# Patient Record
Sex: Male | Born: 1960 | Race: White | Hispanic: No | Marital: Married | Smoking: Heavy tobacco smoker
Health system: Southern US, Community
[De-identification: ages and names within clinical notes are randomized; demographics above are authoritative.]

## PROBLEM LIST (undated history)

## (undated) DIAGNOSIS — J45909 Unspecified asthma, uncomplicated: Secondary | ICD-10-CM

## (undated) DIAGNOSIS — M5136 Other intervertebral disc degeneration, lumbar region: Secondary | ICD-10-CM

## (undated) DIAGNOSIS — M542 Cervicalgia: Secondary | ICD-10-CM

## (undated) DIAGNOSIS — M549 Dorsalgia, unspecified: Secondary | ICD-10-CM

## (undated) DIAGNOSIS — M5030 Other cervical disc degeneration, unspecified cervical region: Secondary | ICD-10-CM

## (undated) DIAGNOSIS — J189 Pneumonia, unspecified organism: Secondary | ICD-10-CM

## (undated) DIAGNOSIS — J449 Chronic obstructive pulmonary disease, unspecified: Secondary | ICD-10-CM

## (undated) DIAGNOSIS — I10 Essential (primary) hypertension: Secondary | ICD-10-CM

## (undated) DIAGNOSIS — G8929 Other chronic pain: Secondary | ICD-10-CM

## (undated) DIAGNOSIS — M51369 Other intervertebral disc degeneration, lumbar region without mention of lumbar back pain or lower extremity pain: Secondary | ICD-10-CM

## (undated) HISTORY — PX: BILATERAL CARPAL TUNNEL RELEASE: SHX6508

## (undated) HISTORY — DX: Essential (primary) hypertension: I10

## (undated) HISTORY — PX: ULNAR NERVE TRANSPOSITION: SHX2595

## (undated) HISTORY — PX: OTHER SURGICAL HISTORY: SHX169

## (undated) HISTORY — PX: BACK SURGERY: SHX140

## (undated) HISTORY — PX: TONSILLECTOMY: SUR1361

---

## 2012-07-03 HISTORY — PX: COLONOSCOPY: SHX174

## 2012-07-03 HISTORY — PX: ESOPHAGOGASTRODUODENOSCOPY: SHX1529

## 2014-03-16 ENCOUNTER — Emergency Department (HOSPITAL_COMMUNITY)
Admission: EM | Admit: 2014-03-16 | Discharge: 2014-03-16 | Disposition: A | Discharge status: Home / Self Care | Payer: Medicaid Other | Attending: Emergency Medicine | Admitting: Emergency Medicine

## 2014-03-16 ENCOUNTER — Encounter (HOSPITAL_COMMUNITY): Payer: Self-pay | Admitting: Emergency Medicine

## 2014-03-16 DIAGNOSIS — Z9889 Other specified postprocedural states: Secondary | ICD-10-CM | POA: Diagnosis not present

## 2014-03-16 DIAGNOSIS — M5442 Lumbago with sciatica, left side: Secondary | ICD-10-CM

## 2014-03-16 DIAGNOSIS — M5430 Sciatica, unspecified side: Secondary | ICD-10-CM | POA: Diagnosis not present

## 2014-03-16 DIAGNOSIS — M545 Low back pain, unspecified: Secondary | ICD-10-CM | POA: Diagnosis present

## 2014-03-16 DIAGNOSIS — F17200 Nicotine dependence, unspecified, uncomplicated: Secondary | ICD-10-CM | POA: Diagnosis not present

## 2014-03-16 DIAGNOSIS — M5136 Other intervertebral disc degeneration, lumbar region: Secondary | ICD-10-CM

## 2014-03-16 DIAGNOSIS — M5137 Other intervertebral disc degeneration, lumbosacral region: Secondary | ICD-10-CM | POA: Diagnosis not present

## 2014-03-16 DIAGNOSIS — M51369 Other intervertebral disc degeneration, lumbar region without mention of lumbar back pain or lower extremity pain: Secondary | ICD-10-CM

## 2014-03-16 DIAGNOSIS — M51379 Other intervertebral disc degeneration, lumbosacral region without mention of lumbar back pain or lower extremity pain: Secondary | ICD-10-CM | POA: Insufficient documentation

## 2014-03-16 MED ORDER — INDOMETHACIN 25 MG PO CAPS
25.0000 mg | ORAL_CAPSULE | Freq: Once | ORAL | Status: AC
  Administered 2014-03-16: 25 mg via ORAL
  Filled 2014-03-16: qty 1

## 2014-03-16 MED ORDER — DEXAMETHASONE 4 MG PO TABS: ORAL_TABLET | ORAL | Status: DC

## 2014-03-16 MED ORDER — OXYCODONE-ACETAMINOPHEN 5-325 MG PO TABS: 1.0000 | ORAL_TABLET | Freq: Three times a day (TID) | ORAL | Status: DC | PRN

## 2014-03-16 MED ORDER — HYDROMORPHONE HCL PF 2 MG/ML IJ SOLN
2.0000 mg | Freq: Once | INTRAMUSCULAR | Status: AC
Start: 2014-03-16 — End: 2014-03-16
  Administered 2014-03-16: 2 mg via INTRAMUSCULAR
  Filled 2014-03-16: qty 1

## 2014-03-16 MED ORDER — ONDANSETRON HCL 4 MG PO TABS
4.0000 mg | ORAL_TABLET | Freq: Once | ORAL | Status: AC
  Administered 2014-03-16: 4 mg via ORAL
  Filled 2014-03-16: qty 1

## 2014-03-16 MED ORDER — METHOCARBAMOL 500 MG PO TABS: 500.0000 mg | ORAL_TABLET | Freq: Three times a day (TID) | ORAL | Status: DC

## 2014-03-16 MED ORDER — DIAZEPAM 5 MG PO TABS
5.0000 mg | ORAL_TABLET | Freq: Once | ORAL | Status: AC
  Administered 2014-03-16: 5 mg via ORAL
  Filled 2014-03-16: qty 1

## 2014-03-16 MED ORDER — DICLOFENAC SODIUM 75 MG PO TBEC: 75.0000 mg | DELAYED_RELEASE_TABLET | Freq: Two times a day (BID) | ORAL | Status: DC

## 2014-03-16 MED ORDER — OXYCODONE-ACETAMINOPHEN 5-325 MG PO TABS: 1.0000 | ORAL_TABLET | Freq: Four times a day (QID) | ORAL | Status: DC | PRN

## 2014-03-16 NOTE — ED Provider Notes (Signed)
CSN: [id number]     Arrival date & time 03/16/14  1228 History   First MD Initiated Contact with Patient 03/16/14 1448   This chart was scribed for non-physician practitioner Ivery Quale, PA-C, working with Hilario Quarry, MD by Gwenevere Abbot, ED scribe. This patient was seen in room APFT21/APFT21 and the patient's care was started at 3:11 PM.    Chief Complaint  Patient presents with  . Back Pain   Patient is a 53 y.o. male presenting with back pain. The history is provided by the patient. No language interpreter was used.  Back Pain Location:  Lumbar spine Quality:  Aching and shooting Pain severity:  Severe Timing:  Constant Progression:  Worsening Chronicity:  Chronic Relieved by:  Nothing Associated symptoms: no abdominal pain, no chest pain, no dysuria and no fever    HPI Comments:  Coulson Wehner is a 53 y.o. male who presents to the Emergency Department complaining of severe left lower back pain that radiates to the legs bilaterally. Pt has h/o 4 back surgeries.  Pt's last surgery was in 1992 and pt had rods placed at [facility] by Dr. Crista Curb. Pt reports that he is supposed to have a 5th surgery but he is extremely concerned about the risks involved, so he has not had the surgery. Pt reports that he recently moved to the area, and he has not established care with a provider. Pt has attempted to manage his pain with motrin and ibuprofen, but this morning the pain was unusually worse.   History reviewed. No pertinent past medical history. Past Surgical History  Procedure Laterality Date  . Back surgery     History reviewed. No pertinent family history. History  Substance Use Topics  . Smoking status: Current Every Day Smoker  . Smokeless tobacco: Not on file  . Alcohol Use: No    Review of Systems  Constitutional: Negative for fever, chills and activity change.       All ROS Neg except as noted in HPI  Eyes: Negative for photophobia and discharge.  Respiratory:  Negative for cough, shortness of breath and wheezing.   Cardiovascular: Negative for chest pain and palpitations.  Gastrointestinal: Negative for abdominal pain and blood in stool.  Genitourinary: Negative for dysuria, frequency and hematuria.  Musculoskeletal: Positive for arthralgias and back pain. Negative for neck pain.  Skin: Negative.   Neurological: Negative for dizziness, seizures and speech difficulty.  Psychiatric/Behavioral: Negative for hallucinations and confusion.      Allergies  Vicodin  Home Medications   Prior to Admission medications   Medication Sig Start Date End Date Taking? Authorizing Provider  acetaminophen (TYLENOL) 500 MG tablet Take 1,000 mg by mouth every 6 (six) hours as needed for moderate pain.   Yes Historical Provider, MD  ibuprofen (ADVIL,MOTRIN) 800 MG tablet Take 800 mg by mouth every 8 (eight) hours as needed for moderate pain.   Yes Historical Provider, MD   BP 160/102  Pulse 90  Temp(Src) 98.6 F (37 C) (Oral)  Resp 20  Ht  (1.753 m)  Wt 200 lb (90.719 kg)  BMI 29.52 kg/m2  SpO2 100% Physical Exam  Nursing note and vitals reviewed. Constitutional: He is oriented to person, place, and time. He appears well-developed and well-nourished.  Patient appears uncomfortable, rocking back and forth in the bed, and groaning upon my arrival.  HENT:  Head: Normocephalic and atraumatic.  Eyes: EOM are normal.  Neck: Normal range of motion. Neck supple.  Cardiovascular:  Normal rate.   Pulmonary/Chest: Effort normal.  Musculoskeletal: Normal range of motion. He exhibits tenderness.  There is paraspinal tenderness on the right and left at the lumbar region. There no hot areas appreciated. There is some bony area tenderness, but no palpable step off. There no hot areas appreciated.  Neurological: He is alert and oriented to person, place, and time.  Examination is limited due to to patient's lower extremity pain. But no gross motor or sensory  deficit appreciated. Gait was not tested due to patient's pain level.  Skin: Skin is warm and dry.  Psychiatric: He has a normal mood and affect. His behavior is normal.    ED Course  Procedures  DIAGNOSTIC STUDIES: Oxygen Saturation is 100% on RA, normal by my interpretation.  COORDINATION OF CARE: 3:16 PM-Discussed treatment plan which includes *intramuscular pain medication here in the emergency department. Prescription for Percocet, Robaxin, Voltaren, and Decadron.  Labs Review Labs Reviewed - No data to display  Imaging Review No results found.   EKG Interpretation None      MDM  Patient has had multiple surgeries in the past. He continues to have a great deal of pain. He has been told of the need to have another surgery, but is reluctant to do so because of the various risk involved. The blood pressure is 160/102, otherwise the vital signs are within normal limits. The oxygen level is 100% on room air. Within normal limits by my interpretation. Limited examination reveals no emergent changes. Patient states that he is mostly at baseline with the exception of the flareup of this pain.  Have encouraged patient to contact the orthopedic clinics at the one of the [facility] centers as he has had [facility] surgery and followup with his multiple back surgeries. Patient given medication to assist with his discomfort while making these arrangements, to include Decadron, Robaxin, fall turned, and Percocet.    Final diagnoses:  None    *I have reviewed nursing notes, vital signs, and all appropriate lab and imaging results for this patient.** **I personally performed the services described in this documentation, which was scribed in my presence. The recorded information has been reviewed and is accurate.Kathie Dike, PA-C 03/16/14 [phone]

## 2014-03-16 NOTE — Discharge Instructions (Signed)
Consultation with the [facility] orthopedic Center maybe helpful for management. Decadron, diclofenac, and Robaxin daily. Use Percocet for more severe pain. Percocet and Robaxin may cause drowsiness, please use with caution.

## 2014-03-16 NOTE — ED Notes (Signed)
Pt with extreme back pain. Pt with hx of 4 back surgeries including a fusion. Pt can barely stand. Needs great assistance with movement.

## 2014-03-16 NOTE — ED Notes (Signed)
Low back pain, hx of back surgery,

## 2014-03-17 NOTE — ED Provider Notes (Signed)
History/physical exam/procedure(s) were performed by non-physician practitioner and as supervising physician I was immediately available for consultation/collaboration. I have reviewed all notes and am in agreement with care and plan.   Keaun Schnabel S Carey Lafon, MD 03/17/14 2318 

## 2014-04-24 ENCOUNTER — Emergency Department (HOSPITAL_COMMUNITY)
Admission: EM | Admit: 2014-04-24 | Discharge: 2014-04-24 | Disposition: A | Discharge status: Home / Self Care | Payer: Medicaid Other | Attending: Emergency Medicine | Admitting: Emergency Medicine

## 2014-04-24 ENCOUNTER — Encounter (HOSPITAL_COMMUNITY): Payer: Self-pay | Admitting: Emergency Medicine

## 2014-04-24 ENCOUNTER — Emergency Department (HOSPITAL_COMMUNITY): Payer: Medicaid Other

## 2014-04-24 DIAGNOSIS — Z7952 Long term (current) use of systemic steroids: Secondary | ICD-10-CM | POA: Insufficient documentation

## 2014-04-24 DIAGNOSIS — J9801 Acute bronchospasm: Secondary | ICD-10-CM | POA: Insufficient documentation

## 2014-04-24 DIAGNOSIS — Z720 Tobacco use: Secondary | ICD-10-CM | POA: Insufficient documentation

## 2014-04-24 DIAGNOSIS — R0602 Shortness of breath: Secondary | ICD-10-CM | POA: Diagnosis present

## 2014-04-24 DIAGNOSIS — Z79899 Other long term (current) drug therapy: Secondary | ICD-10-CM | POA: Diagnosis not present

## 2014-04-24 DIAGNOSIS — R059 Cough, unspecified: Secondary | ICD-10-CM

## 2014-04-24 DIAGNOSIS — R05 Cough: Secondary | ICD-10-CM

## 2014-04-24 DIAGNOSIS — J441 Chronic obstructive pulmonary disease with (acute) exacerbation: Secondary | ICD-10-CM | POA: Diagnosis not present

## 2014-04-24 HISTORY — DX: Chronic obstructive pulmonary disease, unspecified: J44.9

## 2014-04-24 LAB — COMPREHENSIVE METABOLIC PANEL
ALT: 14 U/L (ref 0–53)
AST: 14 U/L (ref 0–37)
Albumin: 3.8 g/dL (ref 3.5–5.2)
Alkaline Phosphatase: 71 U/L (ref 39–117)
Anion gap: 13 (ref 5–15)
BUN: 9 mg/dL (ref 6–23)
CO2: 24 mEq/L (ref 19–32)
Calcium: 9.3 mg/dL (ref 8.4–10.5)
Chloride: 103 mEq/L (ref 96–112)
Creatinine, Ser: 0.9 mg/dL (ref 0.50–1.35)
GFR calc Af Amer: >90 mL/min (ref >90)
GFR calc non Af Amer: >90 mL/min (ref >90)
Glucose, Bld: 128 mg/dL — ABNORMAL HIGH (ref 70–99)
Potassium: 3.9 mEq/L (ref 3.7–5.3)
Sodium: 140 mEq/L (ref 137–147)
Total Bilirubin: 0.6 mg/dL (ref 0.3–1.2)
Total Protein: 6.9 g/dL (ref 6.0–8.3)

## 2014-04-24 LAB — CBC WITH DIFFERENTIAL/PLATELET
Basophils Absolute: 0 10*3/uL (ref 0.0–0.1)
Basophils Relative: 0 % (ref 0–1)
EOS ABS: 0.2 10*3/uL (ref 0.0–0.7)
EOS PCT: 2 % (ref 0–5)
HCT: 47.3 % (ref 39.0–52.0)
Hemoglobin: 16 g/dL (ref 13.0–17.0)
Lymphocytes Relative: 26 % (ref 12–46)
Lymphs Abs: 2.2 10*3/uL (ref 0.7–4.0)
MCH: 31.1 pg (ref 26.0–34.0)
MCHC: 33.8 g/dL (ref 30.0–36.0)
MCV: 91.8 fL (ref 78.0–100.0)
Monocytes Absolute: 0.7 10*3/uL (ref 0.1–1.0)
Monocytes Relative: 8 % (ref 3–12)
Neutro Abs: 5.3 10*3/uL (ref 1.7–7.7)
Neutrophils Relative %: 64 % (ref 43–77)
Platelets: 227 10*3/uL (ref 150–400)
RBC: 5.15 MIL/uL (ref 4.22–5.81)
RDW: 12.5 % (ref 11.5–15.5)
WBC: 8.3 10*3/uL (ref 4.0–10.5)

## 2014-04-24 LAB — TROPONIN I: Troponin I: <0.3 ng/mL (ref <0.30)

## 2014-04-24 LAB — PRO B NATRIURETIC PEPTIDE: Pro B Natriuretic peptide (BNP): 45.3 pg/mL (ref 0–125)

## 2014-04-24 MED ORDER — OXYCODONE-ACETAMINOPHEN 5-325 MG PO TABS
2.0000 | ORAL_TABLET | Freq: Once | ORAL | Status: AC
  Administered 2014-04-24: 2 via ORAL
  Filled 2014-04-24: qty 2

## 2014-04-24 MED ORDER — AMOXICILLIN 500 MG PO CAPS: 500.0000 mg | ORAL_CAPSULE | Freq: Three times a day (TID) | ORAL | Status: DC

## 2014-04-24 MED ORDER — PREDNISONE 10 MG PO TABS: 20.0000 mg | ORAL_TABLET | Freq: Every day | ORAL | Status: DC

## 2014-04-24 MED ORDER — METHYLPREDNISOLONE SODIUM SUCC 125 MG IJ SOLR
125.0000 mg | Freq: Once | INTRAMUSCULAR | Status: AC
Start: 2014-04-24 — End: 2014-04-24
  Administered 2014-04-24: 125 mg via INTRAVENOUS
  Filled 2014-04-24: qty 2

## 2014-04-24 MED ORDER — HYDROMORPHONE HCL 1 MG/ML IJ SOLN
0.5000 mg | Freq: Once | INTRAMUSCULAR | Status: AC
  Administered 2014-04-24: 0.5 mg via INTRAVENOUS
  Filled 2014-04-24: qty 1

## 2014-04-24 MED ORDER — ALBUTEROL SULFATE (2.5 MG/3ML) 0.083% IN NEBU
2.5000 mg | INHALATION_SOLUTION | Freq: Once | RESPIRATORY_TRACT | Status: AC
  Administered 2014-04-24: 2.5 mg via RESPIRATORY_TRACT
  Filled 2014-04-24: qty 3

## 2014-04-24 MED ORDER — ALBUTEROL SULFATE HFA 108 (90 BASE) MCG/ACT IN AERS
2.0000 | INHALATION_SPRAY | RESPIRATORY_TRACT | Status: DC | PRN
  Administered 2014-04-24: 2 via RESPIRATORY_TRACT
  Filled 2014-04-24: qty 6.7

## 2014-04-24 MED ORDER — IPRATROPIUM-ALBUTEROL 0.5-2.5 (3) MG/3ML IN SOLN
3.0000 mL | Freq: Once | RESPIRATORY_TRACT | Status: AC
  Administered 2014-04-24: 3 mL via RESPIRATORY_TRACT
  Filled 2014-04-24: qty 3

## 2014-04-24 MED ORDER — FLUTICASONE-SALMETEROL 250-50 MCG/DOSE IN AEPB: 1.0000 | INHALATION_SPRAY | Freq: Two times a day (BID) | RESPIRATORY_TRACT | Status: DC

## 2014-04-24 MED ORDER — OXYCODONE-ACETAMINOPHEN 5-325 MG PO TABS: 1.0000 | ORAL_TABLET | Freq: Four times a day (QID) | ORAL | Status: DC | PRN

## 2014-04-24 NOTE — ED Provider Notes (Signed)
CSN: [id number]     Arrival date & time 04/24/14  1119 History   First MD Initiated Contact with Patient 04/24/14 1131     This chart was scribed for Benny LennertJoseph L Guilford Shannahan, MD by Arlan OrganAshley Leger, ED Scribe. This patient was seen in room APA18/APA18 and the patient's care was started 11:36 AM.   Chief Complaint  Patient presents with  . Shortness of Breath   Patient is a 53 y.o. male presenting with shortness of breath. The history is provided by the patient. No language interpreter was used.  Shortness of Breath Severity:  Moderate Onset quality:  Gradual Timing:  Constant Progression:  Worsening Chronicity:  Recurrent Relieved by:  Nothing Worsened by:  Deep breathing Associated symptoms: cough and wheezing   Associated symptoms: no abdominal pain, no chest pain, no headaches and no rash   Cough:    Cough characteristics:  Productive   Sputum characteristics:  Clear   Severity:  Moderate   Timing:  Constant   Progression:  Worsening   Chronicity:  Recurrent Wheezing:    Severity:  Moderate   Timing:  Constant   Progression:  Worsening   Chronicity:  Recurrent Risk factors: tobacco use     HPI Comments: [name] Carter is a 53 y.o. male with a PMHx of COPD who presents to the Emergency Department complaining of a constant, moderate productive cough x few weeks that is progressively worsening. Sputum described as clear. He also reports ongoing wheezing and SOB. Pt also reports discomfort to his chest brought on by deep  breathing. No recent fever or chills. [name] Carter states he utilizes approximately 7-8 breathing treatments daily to help with symptoms related to COPD. He is an every day smoker. Pt with known allergy to Vicodin. No other concerns this visit.  Past Medical History  Diagnosis Date  . COPD (chronic obstructive pulmonary disease)    Past Surgical History  Procedure Laterality Date  . Back surgery     History reviewed. No pertinent family history. History  Substance  Use Topics  . Smoking status: Current Every Day Smoker  . Smokeless tobacco: Not on file  . Alcohol Use: No    Review of Systems  Constitutional: Negative for appetite change and fatigue.  HENT: Negative for congestion, ear discharge and sinus pressure.   Eyes: Negative for discharge.  Respiratory: Positive for cough, shortness of breath and wheezing.   Cardiovascular: Negative for chest pain.  Gastrointestinal: Negative for abdominal pain and diarrhea.  Genitourinary: Negative for frequency and hematuria.  Musculoskeletal: Negative for back pain.  Skin: Negative for rash.  Neurological: Negative for seizures and headaches.  Psychiatric/Behavioral: Negative for hallucinations.      Allergies  Vicodin  Home Medications   Prior to Admission medications   Medication Sig Start Date End Date Taking? Authorizing Provider  acetaminophen (TYLENOL) 500 MG tablet Take 1,000 mg by mouth every 6 (six) hours as needed for moderate pain.    Historical Provider, MD  dexamethasone (DECADRON) 4 MG tablet 1 po bid with food 03/16/14   Kathie DikeHobson M Bryant, PA-C  diclofenac (VOLTAREN) 75 MG EC tablet Take 1 tablet (75 mg total) by mouth 2 (two) times daily. 03/16/14   Kathie DikeHobson M Bryant, PA-C  ibuprofen (ADVIL,MOTRIN) 800 MG tablet Take 800 mg by mouth every 8 (eight) hours as needed for moderate pain.    Historical Provider, MD  methocarbamol (ROBAXIN) 500 MG tablet Take 1 tablet (500 mg total) by mouth 3 (three) times daily. 03/16/14  Kathie DikeHobson M Bryant, PA-C  oxyCODONE-acetaminophen (PERCOCET) 5-325 MG per tablet Take 1 tablet by mouth every 8 (eight) hours as needed for severe pain. 03/16/14   Hurman HornJohn M Bednar, MD  oxyCODONE-acetaminophen (PERCOCET/ROXICET) 5-325 MG per tablet Take 1 tablet by mouth every 6 (six) hours as needed. 03/16/14   Kathie DikeHobson M Bryant, PA-C    Triage Vitals: BP 170/109  Pulse 73  Temp(Src) 97.8 F (36.6 C) (Oral)  Resp 28  Ht 5\' 9"  (1.753 m)  Wt 212 lb (96.163 kg)  BMI 31.29 kg/m2   SpO2 100%    Physical Exam  Constitutional: He is oriented to person, place, and time. He appears well-developed.  HENT:  Head: Normocephalic.  Eyes: Conjunctivae and EOM are normal. No scleral icterus.  Neck: Neck supple. No thyromegaly present.  Cardiovascular: Normal rate and regular rhythm.  Exam reveals no gallop and no friction rub.   No murmur heard. Pulmonary/Chest: No stridor. He has wheezes. He has no rales. He exhibits no tenderness.  Moderate wheezing bilaterally  Abdominal: He exhibits no distension. There is no tenderness. There is no rebound.  Musculoskeletal: Normal range of motion. He exhibits no edema.  Lymphadenopathy:    He has no cervical adenopathy.  Neurological: He is oriented to person, place, and time. He exhibits normal muscle tone. Coordination normal.  Skin: No rash noted. No erythema.  Psychiatric: He has a normal mood and affect. His behavior is normal.    ED Course  Procedures (including critical care time)  DIAGNOSTIC STUDIES: Oxygen Saturation is 100% on RA, Normal by my interpretation.    COORDINATION OF CARE: 11:35 AM- Will order EKG 12-Lead. Will give breathing treatment here in ED. Discussed treatment plan with pt at bedside and pt agreed to plan.     Labs Review Labs Reviewed  COMPREHENSIVE METABOLIC PANEL - Abnormal; Notable for the following:    Glucose, Bld 128 (*)    All other components within normal limits  CBC WITH DIFFERENTIAL  TROPONIN I  PRO B NATRIURETIC PEPTIDE    Imaging Review Dg Chest Portable 1 View  04/24/2014   CLINICAL DATA:  Shortness of breath and cough for 1 week.  EXAM: PORTABLE CHEST - 1 VIEW  COMPARISON:  None.  FINDINGS: Portable AP view of the chest was obtained. The left hemidiaphragm is partially obscured with hazy densities in this region. No focal airspace disease. Heart size is within normal limits. Trachea is midline. Negative for a pneumothorax.  IMPRESSION: Slightly low lung volumes and subtle left  basilar densities. Left basilar densities could represent volume loss.   Electronically Signed   By: Richarda OverlieAdam  Henn M.D.   On: 04/24/2014 12:00     EKG Interpretation   Date/Time:  Friday April 24 2014 11:39:58 EDT Ventricular Rate:  96 PR Interval:  176 QRS Duration: 92 QT Interval:  382 QTC Calculation: 483 R Axis:   -45 Text Interpretation:  Sinus rhythm Left anterior fascicular block Abnormal  R-wave progression, late transition Confirmed by Jermine Bibbee  MD, Bitania Shankland  [phone]([id number]) on 04/24/2014 2:34:25 PM      MDM   Final diagnoses:  None     Copd,  Bronchitis,  Pt improved with tx,   Will tx with inhalers,  Antibiotics and steroids I personally performed the services described in this documentation, which was scribed in my presence. The recorded information has been reviewed and is accurate.    Benny LennertJoseph L Humphrey Guerreiro, MD 04/24/14 [phone]

## 2014-04-24 NOTE — ED Notes (Signed)
Cough, wheeze, sob for 2 weeks , hx of pneumonia with intubation x2 in past.

## 2014-04-24 NOTE — Discharge Instructions (Signed)
Follow up next week for recheck.  Return if problems 

## 2014-05-05 ENCOUNTER — Encounter (HOSPITAL_COMMUNITY): Payer: Self-pay | Admitting: *Deleted

## 2014-05-05 ENCOUNTER — Emergency Department (HOSPITAL_COMMUNITY): Payer: Medicaid Other

## 2014-05-05 ENCOUNTER — Emergency Department (HOSPITAL_COMMUNITY)
Admission: EM | Admit: 2014-05-05 | Discharge: 2014-05-05 | Disposition: A | Discharge status: Home / Self Care | Payer: Medicaid Other | Attending: Emergency Medicine | Admitting: Emergency Medicine

## 2014-05-05 DIAGNOSIS — Z7951 Long term (current) use of inhaled steroids: Secondary | ICD-10-CM | POA: Diagnosis not present

## 2014-05-05 DIAGNOSIS — Z79899 Other long term (current) drug therapy: Secondary | ICD-10-CM | POA: Insufficient documentation

## 2014-05-05 DIAGNOSIS — Z9889 Other specified postprocedural states: Secondary | ICD-10-CM | POA: Insufficient documentation

## 2014-05-05 DIAGNOSIS — J441 Chronic obstructive pulmonary disease with (acute) exacerbation: Secondary | ICD-10-CM | POA: Insufficient documentation

## 2014-05-05 DIAGNOSIS — Z720 Tobacco use: Secondary | ICD-10-CM | POA: Diagnosis not present

## 2014-05-05 DIAGNOSIS — G8929 Other chronic pain: Secondary | ICD-10-CM | POA: Insufficient documentation

## 2014-05-05 DIAGNOSIS — Z8701 Personal history of pneumonia (recurrent): Secondary | ICD-10-CM | POA: Insufficient documentation

## 2014-05-05 DIAGNOSIS — J449 Chronic obstructive pulmonary disease, unspecified: Secondary | ICD-10-CM

## 2014-05-05 DIAGNOSIS — R0602 Shortness of breath: Secondary | ICD-10-CM | POA: Diagnosis present

## 2014-05-05 DIAGNOSIS — M549 Dorsalgia, unspecified: Secondary | ICD-10-CM | POA: Insufficient documentation

## 2014-05-05 DIAGNOSIS — Z792 Long term (current) use of antibiotics: Secondary | ICD-10-CM | POA: Insufficient documentation

## 2014-05-05 DIAGNOSIS — Z7952 Long term (current) use of systemic steroids: Secondary | ICD-10-CM | POA: Diagnosis not present

## 2014-05-05 HISTORY — DX: Pneumonia, unspecified organism: J18.9

## 2014-05-05 LAB — BASIC METABOLIC PANEL
Anion gap: 9 (ref 5–15)
BUN: 11 mg/dL (ref 6–23)
CALCIUM: 9.1 mg/dL (ref 8.4–10.5)
CHLORIDE: 107 meq/L (ref 96–112)
CO2: 27 meq/L (ref 19–32)
Creatinine, Ser: 0.84 mg/dL (ref 0.50–1.35)
Glucose, Bld: 96 mg/dL (ref 70–99)
POTASSIUM: 4.2 meq/L (ref 3.7–5.3)
Sodium: 143 mEq/L (ref 137–147)

## 2014-05-05 LAB — CBC WITH DIFFERENTIAL/PLATELET
BASOS ABS: 0 10*3/uL (ref 0.0–0.1)
Basophils Relative: 0 % (ref 0–1)
Eosinophils Absolute: 0.1 10*3/uL (ref 0.0–0.7)
Eosinophils Relative: 1 % (ref 0–5)
HEMATOCRIT: 43.8 % (ref 39.0–52.0)
Hemoglobin: 14.8 g/dL (ref 13.0–17.0)
LYMPHS PCT: 16 % (ref 12–46)
Lymphs Abs: 2.1 10*3/uL (ref 0.7–4.0)
MCH: 31 pg (ref 26.0–34.0)
MCHC: 33.8 g/dL (ref 30.0–36.0)
MCV: 91.8 fL (ref 78.0–100.0)
MONO ABS: 1 10*3/uL (ref 0.1–1.0)
Monocytes Relative: 8 % (ref 3–12)
NEUTROS ABS: 9.8 10*3/uL — AB (ref 1.7–7.7)
Neutrophils Relative %: 75 % (ref 43–77)
Platelets: 210 10*3/uL (ref 150–400)
RBC: 4.77 MIL/uL (ref 4.22–5.81)
RDW: 12.5 % (ref 11.5–15.5)
WBC: 13 10*3/uL — AB (ref 4.0–10.5)

## 2014-05-05 MED ORDER — IPRATROPIUM BROMIDE 0.02 % IN SOLN
1.0000 mg | Freq: Once | RESPIRATORY_TRACT | Status: AC
  Administered 2014-05-05: 1 mg via RESPIRATORY_TRACT
  Filled 2014-05-05: qty 5

## 2014-05-05 MED ORDER — HYDROCODONE-ACETAMINOPHEN 5-325 MG PO TABS
2.0000 | ORAL_TABLET | Freq: Once | ORAL | Status: AC
  Administered 2014-05-05: 2 via ORAL
  Filled 2014-05-05: qty 2

## 2014-05-05 MED ORDER — AEROCHAMBER PLUS W/MASK MISC: Status: DC

## 2014-05-05 MED ORDER — MORPHINE SULFATE 4 MG/ML IJ SOLN
INTRAMUSCULAR | Status: AC
  Filled 2014-05-05: qty 1

## 2014-05-05 MED ORDER — ALBUTEROL SULFATE HFA 108 (90 BASE) MCG/ACT IN AERS: 2.0000 | INHALATION_SPRAY | RESPIRATORY_TRACT | Status: DC | PRN

## 2014-05-05 MED ORDER — HYDROCODONE-ACETAMINOPHEN 5-325 MG PO TABS: 1.0000 | ORAL_TABLET | Freq: Four times a day (QID) | ORAL | Status: DC | PRN

## 2014-05-05 MED ORDER — MORPHINE SULFATE 4 MG/ML IJ SOLN
4.0000 mg | Freq: Once | INTRAMUSCULAR | Status: AC
  Administered 2014-05-05: 4 mg via INTRAVENOUS
  Filled 2014-05-05: qty 1

## 2014-05-05 MED ORDER — PREDNISONE 10 MG PO TABS: 40.0000 mg | ORAL_TABLET | Freq: Every day | ORAL | Status: DC

## 2014-05-05 MED ORDER — PREDNISONE 50 MG PO TABS
60.0000 mg | ORAL_TABLET | Freq: Once | ORAL | Status: AC
  Administered 2014-05-05: 60 mg via ORAL
  Filled 2014-05-05 (×2): qty 1

## 2014-05-05 MED ORDER — ALBUTEROL (5 MG/ML) CONTINUOUS INHALATION SOLN
15.0000 mg | INHALATION_SOLUTION | RESPIRATORY_TRACT | Status: DC
  Administered 2014-05-05: 15 mg via RESPIRATORY_TRACT
  Filled 2014-05-05: qty 20

## 2014-05-05 MED ORDER — MORPHINE SULFATE 4 MG/ML IJ SOLN
4.0000 mg | Freq: Once | INTRAMUSCULAR | Status: AC
  Administered 2014-05-05: 4 mg via INTRAVENOUS

## 2014-05-05 NOTE — ED Notes (Signed)
Patient ambulated around e.r.  o2 sat maintained at 97-100%.  Patient denies any SOB.

## 2014-05-05 NOTE — ED Provider Notes (Addendum)
CSN: [id number]     Arrival date & time 05/05/14  1201 History  This chart was scribed for Doug Sou, MD by Annye Asa, ED Scribe. This patient was seen in room APA12/APA12 and the patient's care was started at 1:17 PM.    Chief Complaint  Patient presents with  . Shortness of Breath   The history is provided by the patient. No language interpreter was used.     HPI Comments: [name] Carter is a 53 y.o. male who presents to the Emergency Department complaining of 2 weeks of worsening SOB. Patient was recently in [facility] for bronchitis (2 weeks PTA); patient reports that he has trouble coughing due to pain across his chest. He notes a history of 4 back surgeries and another upcoming back surgery, increasing his pain is across his entire chest when coughingchest pain is worse with coughing. Cough is nonproductivehe's been using his inhalers 4 times daily, with minimal relief.. Feels like COPD he's had in the past. He denies fevers at this time, though he notes fevers before his recent visit with bronchitis. He denies vomiting. No fever no other associated symptoms  Patient has a history of COPD and PNA (4x). He utilizes breathing treatments at home (4x daily).   Patient is a smoker. He denies EtOH use or other illegal drug use.    Past Medical History  Diagnosis Date  . COPD (chronic obstructive pulmonary disease)   . Pneumonia    Past Surgical History  Procedure Laterality Date  . Back surgery     History reviewed. No pertinent family history. History  Substance Use Topics  . Smoking status: Light Tobacco Smoker    Types: Cigarettes  . Smokeless tobacco: Not on file  . Alcohol Use: No  no illicit drug use   Review of Systems  Constitutional: Negative.   HENT: Negative.   Respiratory: Positive for cough and shortness of breath.   Cardiovascular: Positive for chest pain.       Syncope  Gastrointestinal: Negative.   Musculoskeletal: Positive for back pain.       Chronic  back pain  Skin: Negative.   Allergic/Immunologic: Negative.   Neurological: Negative.   Psychiatric/Behavioral: Negative.   All other systems reviewed and are negative.     Allergies  Review of patient's allergies indicates no known allergies.  Home Medications   Prior to Admission medications   Medication Sig Start Date End Date Taking? Authorizing Provider  acetaminophen (TYLENOL) 500 MG tablet Take 1,000 mg by mouth every 6 (six) hours as needed for moderate pain.    Historical Provider, MD  albuterol (PROVENTIL) (2.5 MG/3ML) 0.083% nebulizer solution Take 2.5 mg by nebulization 4 (four) times daily.    Historical Provider, MD  amoxicillin (AMOXIL) 500 MG capsule Take 1 capsule (500 mg total) by mouth 3 (three) times daily. 04/24/14   Benny Lennert, MD  Fluticasone-Salmeterol (ADVAIR DISKUS) 250-50 MCG/DOSE AEPB Inhale 1 puff into the lungs 2 (two) times daily. 04/24/14   Benny Lennert, MD  oxyCODONE-acetaminophen (PERCOCET/ROXICET) 5-325 MG per tablet Take 1 tablet by mouth every 6 (six) hours as needed. 04/24/14   Benny Lennert, MD  predniSONE (DELTASONE) 10 MG tablet Take 2 tablets (20 mg total) by mouth daily. 04/24/14   Benny Lennert, MD   BP 139/80 mmHg  Pulse 98  Temp(Src) 99.3 F (37.4 C) (Oral)  Ht 5\' 9"  (1.753 m)  Wt 213 lb (96.616 kg)  BMI 31.44 kg/m2  SpO2 98%  Physical Exam  Constitutional: He appears well-developed and well-nourished. He appears distressed.  Appears uncomfortable speaks in short sentences  HENT:  Head: Normocephalic and atraumatic.  Eyes: Conjunctivae are normal. Pupils are equal, round, and reactive to light.  Neck: Neck supple. No tracheal deviation present. No thyromegaly present.  Cardiovascular: Normal rate and regular rhythm.   No murmur heard. Pulmonary/Chest: He is in respiratory distress.  Mildto moderate respiratory distress. Diffuse coarse rhonchi  Abdominal: Soft. Bowel sounds are normal. He exhibits no distension. There  is no tenderness.  Musculoskeletal: Normal range of motion. He exhibits no edema or tenderness.  Neurological: He is alert. Coordination normal.  Skin: Skin is warm and dry. No rash noted.  Psychiatric: He has a normal mood and affect.  Nursing note and vitals reviewed.   ED Course  Procedures   DIAGNOSTIC STUDIES: Oxygen Saturation is 98% on RA, normal by my interpretation.    COORDINATION OF CARE: 1:21 PM Discussed treatment plan with pt at bedside and pt agreed to plan.   Labs Review Labs Reviewed - No data to display  Imaging Review No results found.   EKG Interpretation None     4:50 PM patient feels breathing is at baseline and feels ready to go home treatment with prednisone, nebulization, opioid pain medicine. He is ambulating in the emergency department without dyspnea Chest x-ray viewed by me Results for orders placed or performed during the hospital encounter of 05/05/14  CBC with Differential  Result Value Ref Range   WBC 13.0 (H) 4.0 - 10.5 K/uL   RBC 4.77 4.22 - 5.81 MIL/uL   Hemoglobin 14.8 13.0 - 17.0 g/dL   HCT 40.943.8 81.139.0 - 91.452.0 %   MCV 91.8 78.0 - 100.0 fL   MCH 31.0 26.0 - 34.0 pg   MCHC 33.8 30.0 - 36.0 g/dL   RDW 78.212.5 95.611.5 - 21.315.5 %   Platelets 210 150 - 400 K/uL   Neutrophils Relative % 75 43 - 77 %   Neutro Abs 9.8 (H) 1.7 - 7.7 K/uL   Lymphocytes Relative 16 12 - 46 %   Lymphs Abs 2.1 0.7 - 4.0 K/uL   Monocytes Relative 8 3 - 12 %   Monocytes Absolute 1.0 0.1 - 1.0 K/uL   Eosinophils Relative 1 0 - 5 %   Eosinophils Absolute 0.1 0.0 - 0.7 K/uL   Basophils Relative 0 0 - 1 %   Basophils Absolute 0.0 0.0 - 0.1 K/uL  Basic metabolic panel  Result Value Ref Range   Sodium 143 137 - 147 mEq/L   Potassium 4.2 3.7 - 5.3 mEq/L   Chloride 107 96 - 112 mEq/L   CO2 27 19 - 32 mEq/L   Glucose, Bld 96 70 - 99 mg/dL   BUN 11 6 - 23 mg/dL   Creatinine, Ser 0.860.84 0.50 - 1.35 mg/dL   Calcium 9.1 8.4 - 57.810.5 mg/dL   GFR calc non Af Amer >90 >90 mL/min    GFR calc Af Amer >90 >90 mL/min   Anion gap 9 5 - 15   Dg Chest 2 View  05/05/2014   CLINICAL DATA:  Shortness of breath and productive cough.  EXAM: CHEST  2 VIEW  COMPARISON:  04/24/2014  FINDINGS: Asymmetric inflation, with volume loss on the left and streaky retrocardiac opacity in the frontal projection. No lobar collapse or consolidation seen in the lateral projection. Normal heart size and aortic contours. There is no edema, consolidation, effusion, or pneumothorax.  IMPRESSION: Retrocardiac  opacity, likely atelectasis.   Electronically Signed   By: Tiburcio PeaJonathan  Watts M.D.   On: 05/05/2014 13:28   Dg Chest Portable 1 View  04/24/2014   CLINICAL DATA:  Shortness of breath and cough for 1 week.  EXAM: PORTABLE CHEST - 1 VIEW  COMPARISON:  None.  FINDINGS: Portable AP view of the chest was obtained. The left hemidiaphragm is partially obscured with hazy densities in this region. No focal airspace disease. Heart size is within normal limits. Trachea is midline. Negative for a pneumothorax.  IMPRESSION: Slightly low lung volumes and subtle left basilar densities. Left basilar densities could represent volume loss.   Electronically Signed   By: Richarda OverlieAdam  Henn M.D.   On: 04/24/2014 12:00    Councilled pt for5 minutes smoking cessation MDM  Plan prescriptionPrednisone, Norco. Albuterol HFA with spacer. Spent 5 minutes counseling patient on smoking cessation Referral resource guide Diagnosis #1 exacerbation of COPD #2 tobacco abuse #3 chronic back pain  Final diagnoses:  SOB (shortness of breath)    I personally performed the services described in this documentation, which was scribed in my presence. The recorded information has been reviewed and considered.      Doug SouSam Burnell Matlin, MD 05/05/14 1702  Doug SouSam Niyana Chesbro, MD 05/05/14 [phone]

## 2014-05-05 NOTE — ED Notes (Signed)
Pt reports bronchitis 2 weeks ago, pt with SOB, congestion, states it hurts to cough with back pain

## 2014-05-05 NOTE — Discharge Instructions (Signed)
Chronic Asthmatic Bronchitis Use your inhaler as directed. Return if needed more than every 4 hours. Start taking prednisone tomorrow. Take the pain medicine prescribed for bad pain or Tylenol for mild pain. Call any of the numbers on the resource guide to get a primary care physician or contact her local health department.ask your new physician to help you to stop smoking. Chronic asthmatic bronchitis is a complication of persistent asthma. After a period of time with asthma, some people develop airflow obstruction that is present all the time, even when not having an asthma attack.There is also persistent inflammation of the airways, and the bronchial tubes produce more mucus. Chronic asthmatic bronchitis usually is a permanent problem with the lungs. CAUSES  Chronic asthmatic bronchitis happens most often in people who have asthma and also smoke cigarettes. Occasionally, it can happen to a person with long-standing or severe asthma even if the person is not a smoker. SIGNS AND SYMPTOMS  Chronic asthmatic bronchitis usually causes symptoms of both asthma and chronic bronchitis, including:   Coughing.  Increased sputum production.  Wheezing and shortness of breath.  Chest discomfort.  Recurring infections. DIAGNOSIS  Your health care provider will take a medical history and perform a physical exam. Chronic asthmatic bronchitis is suspected when a person with asthma has abnormal results on breathing tests (pulmonary function tests) even when breathing symptoms are at their best. Other tests, such as a chest X-ray, may be performed to rule out other conditions.  TREATMENT  Treatment involves controlling symptoms with medicine and lifestyle changes.  Your health care provider may prescribe asthma medicines, including inhaler and nebulizer medicines.  Infection can be treated with medicine to kill germs (antibiotics). Serious infections may require hospitalization. These can  include:  Pneumonia.  Sinus infections.  Acute bronchitis.   Preventing infection and hospitalization is very important. Get an influenza vaccination every year as directed by your health care provider. Ask your health care provider whether you need a pneumonia vaccine.  Ask your health care provider whether you would benefit from a pulmonary rehabilitation program. HOME CARE INSTRUCTIONS  Take medicines only as directed by your health care provider.  If you are a cigarette smoker, the most important thing that you can do is quit. Talk to your health care provider for help with quitting smoking.  Avoid pollen, dust, animal dander, molds, smoke, and other things that cause attacks.  Regular exercise is very important to help you feel better. Discuss possible exercise routines with your health care provider.  If animal dander is the cause of asthma, you may not be able to keep pets.  It is important that you:  Become educated about your medical condition.  Participate in maintaining wellness.  Seek medical care as directed. Delay in seeking medical care could cause permanent injury and may be a risk to your life. SEEK MEDICAL CARE IF:  You have wheezing and shortness of breath even if taking medicine to prevent attacks.  You have muscle aches, chest pain, or thickening of sputum.  Your sputum changes from clear or white to yellow, green, gray, or bloody. SEEK IMMEDIATE MEDICAL CARE IF:  Your usual medicines do not stop your wheezing.  You have increased coughing or shortness of breath or both.  You have increased difficulty breathing.  You have any problems from the medicine you are taking, such as a rash, itching, swelling, or trouble breathing. MAKE SURE YOU:   Understand these instructions.  Will watch your condition.  Will get  help right away if you are not doing well or get worse. Document Released: 04/06/2006 Document Revised: 11/03/2013 Document Reviewed:  07/28/2013 [facility] Patient Information 2015 [place], [place]. This information is not intended to replace advice given to you by your health care provider. Make sure you discuss any questions you have with your health care provider.  Emergency Department Resource Guide 1) Find a Doctor and Pay Out of Pocket Although you won't have to find out who is covered by your insurance plan, it is a good idea to ask around and get recommendations. You will then need to call the office and see if the doctor you have chosen will accept you as a new patient and what types of options they offer for patients who are self-pay. Some doctors offer discounts or will set up payment plans for their patients who do not have insurance, but you will need to ask so you aren't surprised when you get to your appointment.  2) Contact Your Local Health Department Not all health departments have doctors that can see patients for sick visits, but many do, so it is worth a call to see if yours does. If you don't know where your local health department is, you can check in your phone book. The CDC also has a tool to help you locate your state's health department, and many state websites also have listings of all of their local health departments.  3) Find a Walk-in Clinic If your illness is not likely to be very severe or complicated, you may want to try a walk in clinic. These are popping up all over the country in pharmacies, drugstores, and shopping centers. They're usually staffed by nurse practitioners or physician assistants that have been trained to treat common illnesses and complaints. They're usually fairly quick and inexpensive. However, if you have serious medical issues or chronic medical problems, these are probably not your best option.  No Primary Care Doctor: - Call Health Connect at  [phone] - they can help you locate a primary care doctor that  accepts your insurance, provides certain services, etc. - Physician  Referral Service- [phone]  Chronic Pain Problems: Organization         Address  Phone   Notes  [facility]  [phone] Patients need to be referred by their primary care doctor.   Medication Assistance: Organization         Address  Phone   Notes  [facility] [address] [address] [place] [place], [place] [address] [phone] --Must be a resident of [facility] -- Must have NO insurance coverage whatsoever (no Medicaid/ Medicare, etc.) -- The pt. MUST have a primary care doctor that directs their care regularly and follows them in the community   [facility]  [phone]   [employer]  [phone]    Agencies that provide inexpensive medical care: Organization         Address  Phone   Notes  [facility]  [phone]   [facility]    [phone]   [facility] [address] [place], [place] [address] [phone]   [facility] [address] [place], [place] [phone]   Planned Parenthood    [phone]   [facility]    [phone]   [facility]  [address], [place] Phone:  [phone], Fax:  [phone])  [phone] Hours of Operation:  9 am - 6 pm, M-F.  Also accepts Medicaid/Medicare and self-pay.  [facility]  [address], [place] Phone: [phone], Fax: [phone]) [phone]. Hours of Operation:  8:30 am - 5:30 pm, M-F.  Also accepts Medicaid and self-pay.  [facility] [place] [address], [place] Phone: [phone](336) [phone]   [facility] [address], [place], [place] [phone], Ext. [phone] Mondays & Thursdays: 7-9 AM.  First 15 patients are seen on a first come, first serve basis.    Medicaid-accepting [facility] Providers:  Organization         Address  Phone   Notes  [facility] [address], [place] [phone]) [phone] Also accepts self-pay patients.  [facility] [address], [place]  [phone](336) [phone]   [facility] [address], [place] [phone]) [phone]   [facility] [address] [place] [address], [place] [phone](336) [phone]   Renaye RakersVeita Bland [address], [place]   [phone] Only accepts [place] Access [place] patients after they have their name applied to their card.   Self-Pay (no insurance) in [facility]:  Organization         Address  Phone   Notes  Sickle Cell Patients, [facility] [address], [place] [phone]) [phone]   [facility] [address], [place] [phone]) [phone]   [facility]  [address], [place] [phone]   [facility]/Dr. Osei-Bonsu  [address] [place] [address], [place] or [phone], [place] [phone]) [phone] Phone number for both [place] and [place] locations is the same.  [facility] [address], [place] [phone](336) [phone]   [facility] [address] [place] [address], [place] or [address] [phone](336) [phone](336) [phone]   [facility] [address], [place] [phone](336) [phone], phone; [phone](336) [phone], fax Sees patients 1st and 3rd Saturday of every month.  Must not qualify for public or private insurance (i.e. Medicaid, Medicare, [place] Health Choice, Veterans' Benefits)  Household income should be no more than 200% of the poverty level The clinic cannot treat you if you are pregnant or think you are pregnant  Sexually transmitted diseases are not treated at the clinic.    Dental Care: Organization         Address  Phone  Notes  [facility] [address], [place] [phone]) [phone] Accepts children up to age 53 who are enrolled  in [place]icaid or [place] Health Choice; pregnant women with a Medicaid card; and children who have applied for Medicaid or [place] Health Choice, but were declined, whose parents can pay a reduced fee at time of service.  [facility] of [facility] [place]  [address], [place] [phone]) [phone] Accepts children up to age 53 who are enrolled in [place]icaid or [place] Health Choice; pregnant women with a Medicaid card; and children who have applied for Medicaid or [place] Health Choice, but were declined, whose parents can pay a reduced fee at time of service.  [facility]  [address], [place] [phone]) [phone] Patients are seen by appointment only. Walk-ins are not accepted. [facility]  will see patients 318 years of age and older. Monday - Tuesday (8am-5pm) Most Wednesdays (8:30-5pm) $30 per visit, cash only  [facility]  [address], [place] [phone]) [phone] Patients are seen by appointment only. Walk-ins are not accepted. [facility] will see patients 53 years of age and older. One Wednesday Evening (Monthly: Volunteer Based).  $30 per visit, cash only  [facility]  [phone]) [phone] for adults; Children under age 104, call Graduate Pediatric Dentistry at [phone]) [phone]. Children aged 374-14, please call [phone]) [phone] to request a pediatric application.  Dental services are provided in all areas of dental care including fillings, crowns and bridges, complete and partial dentures, implants, gum treatment, root canals, and extractions. Preventive care is also provided. Treatment is provided to both adults and children. Patients are selected via a lottery and there is often a waiting list.   [facility] [address], [place]  [phone](336) [phone] [email or web address]   [facility] [address], [place], [place] [phone], Ext. [phone] Second and Fourth Thursday of each month, opens at  6:30 AM; Clinic ends at 9 AM.  Patients are seen on a first-come first-served basis, and a limited number are seen during each clinic.   [facility]  [address], [place], [place] [phone]) [phone]   Eligibility Requirements You must have lived in [place], [place], or [place] counties for at least the last three months.   You cannot be eligible for state or federal sponsored National Cityhealthcare insurance, including CIGNAVeterans Administration, [place]icaid, or [employer]Medicare.   You generally cannot be eligible for healthcare insurance through your employer.    How to apply: Eligibility screenings are held every Tuesday and Wednesday afternoon from 1:00 pm until 4:00 pm. You do not need an appointment for the interview!  [facility] [address], [place], [place] [phone]   [facility]  [phone]   [facility]  [phone]   [facility]  [phone]    Behavioral Health Resources in the Community: Intensive Outpatient Programs Organization         Address  Phone  Notes  [facility] [address], [place], [place] [phone]   [facility] [address], [place], [place] [phone]   [facility] [address], [place], [place]  [phone]   [facility] [address],  [place], [place] [phone] or [phone] [phone]   Substance Abuse Resources Organization         Address  Phone  Notes  [facility]  [phone]   [facility]  [phone]   [facility]  [phone]   [facility]  [phone]   Residential & Outpatient Substance Abuse Program  [phone]   Psychological Services Organization         Address  Phone  Notes  [facility]  [phone]- [phone]   [facility]  [phone]- [phone]   [facility] [address], [place]  [phone] or [phone] [phone]    Mobile Crisis Teams Organization         Address  Phone  Notes  [facility], Mobile Crisis Care Unit  [phone]   [facility]  [address] [place], [place] [phone]   [facility] De[facility] [address] [place] [phone]    Self-Help/Support Groups Organization  Address  Phone             Notes  [facility] [place] - variety of support groups  336- I7437963 Call for more information  Narcotics Anonymous (NA), [facility] [address], [employer] [place]  2 meetings at this location   Statistician         Address  Phone  Notes  [facility] [address],    [place]  [phone]   [facility]  [address] [place] [address], [place], [place] [phone]   [facility] [address] [place], [place] [phone] Admissions: 8am-3pm M-F  [facility] [address]    [place], [place] [phone]   [facility] [address] [place], [place], [place] [phone]   [facility] [address],  [place], [place] [phone]   [facility] - Intensive Outpatient [address], [place], [place] [phone]   [facility] ([facility] [address] [place].,  [place], [place] [phone] or [phone]   [facility] ([facility]) [address] [place], [place] [phone] Accepts Medicaid  [facility] [address]  [place] [phone] Substance Abuse/Addiction Treatment   [facility] Organization         Address  Phone  Notes  [facility]  [phone]   Angie Fava, PhD [address] [place], [place]   [phone] or [phone]   [facility]   [address] [place], [place] [phone]   [facility]  [address] [place], [place] [phone] Insurance/Medicaid/sponsorship through [facility] and Families [address]                                    [place], [place] [phone] Therapy/tele-psych/case  [facility] [address] [place], [place] [phone]    Dr. Lolly Mustache  [phone]   [facility]  [employer] [facility] Dept. 1) [address] [place] 2) [address] [place] 3)  [address] [place] [address], [place] [phone]  [phone]   [facility] Child Abuse Hotline (505)[phone] or 9412659060 (After Hours)

## 2014-05-05 NOTE — ED Notes (Signed)
MD at the bedside, Called RT

## 2014-05-05 NOTE — ED Notes (Signed)
Patient given discharge instruction, verbalized understand. IV removed, band aid applied. Patient ambulatory out of the department.  

## 2014-05-05 NOTE — ED Notes (Signed)
Pt reports that he has had to be intubated x 2 when it has gotten this bad

## 2014-05-05 NOTE — ED Notes (Signed)
Pt has audible crackles when breathing. Pt in severe pain when coughing due to previous back surgeries.

## 2014-05-07 ENCOUNTER — Emergency Department (HOSPITAL_COMMUNITY)
Admission: EM | Admit: 2014-05-07 | Discharge: 2014-05-07 | Disposition: A | Discharge status: Home / Self Care | Payer: Medicaid Other | Attending: Emergency Medicine | Admitting: Emergency Medicine

## 2014-05-07 ENCOUNTER — Emergency Department (HOSPITAL_COMMUNITY): Payer: Medicaid Other

## 2014-05-07 ENCOUNTER — Encounter (HOSPITAL_COMMUNITY): Payer: Self-pay | Admitting: Emergency Medicine

## 2014-05-07 DIAGNOSIS — Z7952 Long term (current) use of systemic steroids: Secondary | ICD-10-CM | POA: Insufficient documentation

## 2014-05-07 DIAGNOSIS — Z7951 Long term (current) use of inhaled steroids: Secondary | ICD-10-CM | POA: Diagnosis not present

## 2014-05-07 DIAGNOSIS — Z8701 Personal history of pneumonia (recurrent): Secondary | ICD-10-CM | POA: Insufficient documentation

## 2014-05-07 DIAGNOSIS — F419 Anxiety disorder, unspecified: Secondary | ICD-10-CM | POA: Insufficient documentation

## 2014-05-07 DIAGNOSIS — R0789 Other chest pain: Secondary | ICD-10-CM | POA: Diagnosis not present

## 2014-05-07 DIAGNOSIS — R079 Chest pain, unspecified: Secondary | ICD-10-CM | POA: Diagnosis present

## 2014-05-07 DIAGNOSIS — J40 Bronchitis, not specified as acute or chronic: Secondary | ICD-10-CM

## 2014-05-07 DIAGNOSIS — Z79899 Other long term (current) drug therapy: Secondary | ICD-10-CM | POA: Diagnosis not present

## 2014-05-07 DIAGNOSIS — Z792 Long term (current) use of antibiotics: Secondary | ICD-10-CM | POA: Diagnosis not present

## 2014-05-07 DIAGNOSIS — Z720 Tobacco use: Secondary | ICD-10-CM | POA: Diagnosis not present

## 2014-05-07 DIAGNOSIS — J441 Chronic obstructive pulmonary disease with (acute) exacerbation: Secondary | ICD-10-CM | POA: Diagnosis not present

## 2014-05-07 DIAGNOSIS — R059 Cough, unspecified: Secondary | ICD-10-CM

## 2014-05-07 DIAGNOSIS — R05 Cough: Secondary | ICD-10-CM

## 2014-05-07 MED ORDER — OXYCODONE-ACETAMINOPHEN 5-325 MG PO TABS: 1.0000 | ORAL_TABLET | ORAL | Status: DC | PRN

## 2014-05-07 MED ORDER — OXYCODONE-ACETAMINOPHEN 5-325 MG PO TABS
1.0000 | ORAL_TABLET | Freq: Once | ORAL | Status: AC
  Administered 2014-05-07: 1 via ORAL
  Filled 2014-05-07: qty 1

## 2014-05-07 NOTE — Discharge Instructions (Signed)

## 2014-05-07 NOTE — ED Notes (Signed)
Pt c/o cough since last week. C/o chest pain "I think I cracked a rib". Pt was seen here two days ago for cough and sent home on Prednisone. Was seen here 2 weeks ago for same and put on antibiotics.

## 2014-05-07 NOTE — ED Provider Notes (Signed)
CSN: [id number]     Arrival date & time 05/07/14  2017 History   First MD Initiated Contact with Patient 05/07/14 2039     Chief Complaint  Patient presents with  . Cough  . Chest Pain     Patient is a 53 y.o. male presenting with cough and chest pain. The history is provided by the patient.  Cough Cough characteristics:  Productive Sputum characteristics:  Yellow Severity:  Moderate Onset quality:  Gradual Duration:  2 weeks Timing:  Constant Progression:  Worsening Chronicity:  Recurrent Smoker: yes   Relieved by:  Nothing Worsened by:  Deep breathing (movement/palpation) Associated symptoms: chest pain   Associated symptoms: no fever and no shortness of breath   Chest Pain Associated symptoms: cough   Associated symptoms: no fever, no shortness of breath, not vomiting and no weakness   Patient reports cough for over 2 weeks He reports it started as dry cough at first, now he has yellow sputum.  No hemoptysis He reports diffuse Chest wall pain with cough, movement or deep breathing He denies weakness/dizziness/syncope No dyspnea on exertion He denies h/o CAD/PE  He reports recent ER evaluation for the same He has been on pain medications, antibiotics as well as albuterol  Past Medical History  Diagnosis Date  . COPD (chronic obstructive pulmonary disease)   . Pneumonia    Past Surgical History  Procedure Laterality Date  . Back surgery     No family history on file. History  Substance Use Topics  . Smoking status: Light Tobacco Smoker    Types: Cigarettes  . Smokeless tobacco: Not on file  . Alcohol Use: No    Review of Systems  Constitutional: Negative for fever.  Respiratory: Positive for cough. Negative for shortness of breath.   Cardiovascular: Positive for chest pain. Negative for leg swelling.  Gastrointestinal: Negative for vomiting.  Neurological: Negative for weakness.  All other systems reviewed and are negative.     Allergies  Review of  patient's allergies indicates no known allergies.  Home Medications   Prior to Admission medications   Medication Sig Start Date End Date Taking? Authorizing Provider  acetaminophen (TYLENOL) 500 MG tablet Take 1,000 mg by mouth every 6 (six) hours as needed for moderate pain.    Historical Provider, MD  albuterol (PROVENTIL HFA;VENTOLIN HFA) 108 (90 BASE) MCG/ACT inhaler Inhale 2 puffs into the lungs every 4 (four) hours as needed for wheezing or shortness of breath (cough). 05/05/14   Doug SouSam Jacubowitz, MD  albuterol (PROVENTIL) (2.5 MG/3ML) 0.083% nebulizer solution Take 2.5 mg by nebulization 4 (four) times daily.    Historical Provider, MD  amoxicillin (AMOXIL) 500 MG capsule Take 1 capsule (500 mg total) by mouth 3 (three) times daily. Patient not taking: Reported on 05/05/2014 04/24/14   Benny LennertJoseph L Zammit, MD  Fluticasone-Salmeterol (ADVAIR DISKUS) 250-50 MCG/DOSE AEPB Inhale 1 puff into the lungs 2 (two) times daily. 04/24/14   Benny LennertJoseph L Zammit, MD  HYDROcodone-acetaminophen (NORCO) 5-325 MG per tablet Take 1-2 tablets by mouth every 6 (six) hours as needed for moderate pain or severe pain. 05/05/14   Doug SouSam Jacubowitz, MD  oxyCODONE-acetaminophen (PERCOCET/ROXICET) 5-325 MG per tablet Take 1 tablet by mouth every 6 (six) hours as needed. Patient not taking: Reported on 05/05/2014 04/24/14   Benny LennertJoseph L Zammit, MD  predniSONE (DELTASONE) 10 MG tablet Take 2 tablets (20 mg total) by mouth daily. Patient not taking: Reported on 05/05/2014 04/24/14   Benny LennertJoseph L Zammit, MD  predniSONE (DELTASONE)  10 MG tablet Take 4 tablets (40 mg total) by mouth daily. 05/05/14   Doug SouSam Jacubowitz, MD  Spacer/Aero-Holding Chambers (AEROCHAMBER PLUS WITH MASK) inhaler Use as instructed 05/05/14   Doug SouSam Jacubowitz, MD   BP 147/94 mmHg  Pulse 107  Temp(Src) 98.6 F (37 C) (Oral)  Resp 20  Ht 5\' 9"  (1.753 m)  Wt 203 lb (92.08 kg)  BMI 29.96 kg/m2  SpO2 97% Physical Exam CONSTITUTIONAL: Well developed/well nourished,  anxious HEAD: Normocephalic/atraumatic EYES: EOMI/PERRL ENMT: Mucous membranes moist NECK: supple no meningeal signs SPINE:entire spine nontender CV: S1/S2 noted, no murmurs/rubs/gallops noted LUNGS: scattered wheezing noted bilaterally.  No crackles He can speak clearly without any difficulty Chest - diffuse chest wall tenderness, no crepitus ABDOMEN: soft, nontender, no rebound or guarding GU:no cva tenderness NEURO: Pt is awake/alert, moves all extremitiesx4 EXTREMITIES: pulses normal, full ROM, no LE edema SKIN: warm, color normal PSYCH: anxious  ED Course  Procedures   9:52 PM Pt resting on reassessment He only has pain with movement of upper body, any palpation of chest or any cough I doubt ACS/PE at this time His CXR/EKG are unremarkable He has no hypoxia He reports not tolerating vicodin I cancelled his vicodin and gave short course of percocet Discussed need for outpatient followup/management We discussed strict return precautions   Imaging Review Dg Chest 2 View  05/07/2014   CLINICAL DATA:  Initial evaluation for cough. Recently diagnosed with bronchitis. Worsening symptoms despite antibiotics.  EXAM: CHEST  2 VIEW  COMPARISON:  Prior radiograph from 05/05/2014  FINDINGS: The cardiac and mediastinal silhouettes are stable in size and contour, and remain within normal limits.  The lungs are normally inflated. No airspace consolidation, pleural effusion, or pulmonary edema is identified. There is no pneumothorax.  No acute osseous abnormality identified.  IMPRESSION: No active cardiopulmonary disease. Specifically, no focal infiltrate to suggest acute infectious pneumonitis.   Electronically Signed   By: Rise MuBenjamin  McClintock M.D.   On: 05/07/2014 21:13     EKG Interpretation   Date/Time:  Thursday May 07 2014 21:06:36 EST Ventricular Rate:  100 PR Interval:  147 QRS Duration: 91 QT Interval:  340 QTC Calculation: 438 R Axis:   -24 Text Interpretation:   Sinus tachycardia Borderline left axis deviation ST  elev, probable normal early repol pattern No significant change since last  tracing Confirmed by Bebe ShaggyWICKLINE  MD, Shadoe Cryan ([id number]) on 05/07/2014 9:10:15 PM      MDM   Final diagnoses:  Chest wall pain  Bronchitis    Nursing notes including past medical history and social history reviewed and considered in documentation xrays reviewed and considered Previous records reviewed and considered Narcotic database reviewed and considered in decision making     Joya Gaskinsonald W Ronnisha Felber, MD 05/07/14 2154

## 2014-05-09 ENCOUNTER — Encounter (HOSPITAL_COMMUNITY): Payer: Self-pay

## 2014-05-09 ENCOUNTER — Emergency Department (HOSPITAL_COMMUNITY): Payer: Medicaid Other

## 2014-05-09 ENCOUNTER — Emergency Department (HOSPITAL_COMMUNITY)
Admission: EM | Admit: 2014-05-09 | Discharge: 2014-05-09 | Disposition: A | Discharge status: Home / Self Care | Payer: Medicaid Other | Attending: Emergency Medicine | Admitting: Emergency Medicine

## 2014-05-09 DIAGNOSIS — Z7951 Long term (current) use of inhaled steroids: Secondary | ICD-10-CM | POA: Insufficient documentation

## 2014-05-09 DIAGNOSIS — Z79899 Other long term (current) drug therapy: Secondary | ICD-10-CM | POA: Insufficient documentation

## 2014-05-09 DIAGNOSIS — Z7952 Long term (current) use of systemic steroids: Secondary | ICD-10-CM | POA: Diagnosis not present

## 2014-05-09 DIAGNOSIS — Z720 Tobacco use: Secondary | ICD-10-CM | POA: Diagnosis not present

## 2014-05-09 DIAGNOSIS — M549 Dorsalgia, unspecified: Secondary | ICD-10-CM | POA: Insufficient documentation

## 2014-05-09 DIAGNOSIS — R000 Tachycardia, unspecified: Secondary | ICD-10-CM | POA: Insufficient documentation

## 2014-05-09 DIAGNOSIS — R0902 Hypoxemia: Secondary | ICD-10-CM

## 2014-05-09 DIAGNOSIS — J441 Chronic obstructive pulmonary disease with (acute) exacerbation: Secondary | ICD-10-CM | POA: Diagnosis not present

## 2014-05-09 DIAGNOSIS — Z8701 Personal history of pneumonia (recurrent): Secondary | ICD-10-CM | POA: Insufficient documentation

## 2014-05-09 DIAGNOSIS — R0602 Shortness of breath: Secondary | ICD-10-CM | POA: Diagnosis present

## 2014-05-09 DIAGNOSIS — R51 Headache: Secondary | ICD-10-CM | POA: Diagnosis not present

## 2014-05-09 LAB — COMPREHENSIVE METABOLIC PANEL
ALBUMIN: 3.3 g/dL — AB (ref 3.5–5.2)
ALT: 27 U/L (ref 0–53)
ANION GAP: 16 — AB (ref 5–15)
AST: 21 U/L (ref 0–37)
Alkaline Phosphatase: 85 U/L (ref 39–117)
BUN: 12 mg/dL (ref 6–23)
CALCIUM: 9.1 mg/dL (ref 8.4–10.5)
CO2: 22 mEq/L (ref 19–32)
Chloride: 101 mEq/L (ref 96–112)
Creatinine, Ser: 0.78 mg/dL (ref 0.50–1.35)
GFR calc Af Amer: >90 mL/min (ref >90)
GFR calc non Af Amer: >90 mL/min (ref >90)
Glucose, Bld: 106 mg/dL — ABNORMAL HIGH (ref 70–99)
Potassium: 3.9 mEq/L (ref 3.7–5.3)
Sodium: 139 mEq/L (ref 137–147)
TOTAL PROTEIN: 6.7 g/dL (ref 6.0–8.3)
Total Bilirubin: 0.7 mg/dL (ref 0.3–1.2)

## 2014-05-09 LAB — I-STAT VENOUS BLOOD GAS, ED
Acid-base deficit: 1 mmol/L (ref 0.0–2.0)
Bicarbonate: 24.3 mEq/L — ABNORMAL HIGH (ref 20.0–24.0)
O2 Saturation: 98 %
PCO2 VEN: 39.6 mmHg — AB (ref 45.0–50.0)
PH VEN: 7.396 — AB (ref 7.250–7.300)
TCO2: 25 mmol/L (ref 0–100)
pO2, Ven: 103 mmHg — ABNORMAL HIGH (ref 30.0–45.0)

## 2014-05-09 LAB — CBC
HEMATOCRIT: 41.7 % (ref 39.0–52.0)
Hemoglobin: 13.7 g/dL (ref 13.0–17.0)
MCH: 30.2 pg (ref 26.0–34.0)
MCHC: 32.9 g/dL (ref 30.0–36.0)
MCV: 91.9 fL (ref 78.0–100.0)
Platelets: 203 10*3/uL (ref 150–400)
RBC: 4.54 MIL/uL (ref 4.22–5.81)
RDW: 12.4 % (ref 11.5–15.5)
WBC: 13.7 10*3/uL — ABNORMAL HIGH (ref 4.0–10.5)

## 2014-05-09 LAB — I-STAT CG4 LACTIC ACID, ED: Lactic Acid, Venous: 1.59 mmol/L (ref 0.5–2.2)

## 2014-05-09 LAB — I-STAT TROPONIN, ED: TROPONIN I, POC: 0 ng/mL (ref 0.00–0.08)

## 2014-05-09 MED ORDER — SODIUM CHLORIDE 0.9 % IV BOLUS (SEPSIS)
1000.0000 mL | Freq: Once | INTRAVENOUS | Status: AC
  Administered 2014-05-09: 1000 mL via INTRAVENOUS

## 2014-05-09 MED ORDER — MORPHINE SULFATE 2 MG/ML IJ SOLN
INTRAMUSCULAR | Status: AC
  Filled 2014-05-09: qty 1

## 2014-05-09 MED ORDER — MORPHINE SULFATE 2 MG/ML IJ SOLN
2.0000 mg | Freq: Once | INTRAMUSCULAR | Status: AC
  Administered 2014-05-09: 2 mg via INTRAVENOUS

## 2014-05-09 MED ORDER — ALBUTEROL (5 MG/ML) CONTINUOUS INHALATION SOLN
20.0000 mg/h | INHALATION_SOLUTION | Freq: Once | RESPIRATORY_TRACT | Status: AC
  Administered 2014-05-09: 20 mg/h via RESPIRATORY_TRACT

## 2014-05-09 MED ORDER — MORPHINE SULFATE 2 MG/ML IJ SOLN
2.0000 mg | Freq: Once | INTRAMUSCULAR | Status: DC
  Administered 2014-05-09: 2 mg via INTRAVENOUS

## 2014-05-09 MED ORDER — MORPHINE SULFATE 4 MG/ML IJ SOLN
4.0000 mg | Freq: Once | INTRAMUSCULAR | Status: DC
Start: 2014-05-09 — End: 2014-05-09

## 2014-05-09 MED ORDER — LORAZEPAM 2 MG/ML IJ SOLN
1.0000 mg | Freq: Once | INTRAMUSCULAR | Status: AC
Start: 2014-05-09 — End: 2014-05-09
  Administered 2014-05-09: 1 mg via INTRAVENOUS
  Filled 2014-05-09: qty 1

## 2014-05-09 MED ORDER — IPRATROPIUM BROMIDE 0.02 % IN SOLN
1.0000 mg | Freq: Once | RESPIRATORY_TRACT | Status: AC
  Administered 2014-05-09: 1 mg via RESPIRATORY_TRACT

## 2014-05-09 MED ORDER — IPRATROPIUM BROMIDE 0.02 % IN SOLN
RESPIRATORY_TRACT | Status: AC
  Administered 2014-05-09: 1 mg via RESPIRATORY_TRACT
  Filled 2014-05-09: qty 5

## 2014-05-09 MED ORDER — IOHEXOL 350 MG/ML SOLN
100.0000 mL | Freq: Once | INTRAVENOUS | Status: AC | PRN
  Administered 2014-05-09: 100 mL via INTRAVENOUS

## 2014-05-09 MED ORDER — HYDROMORPHONE HCL 1 MG/ML IJ SOLN
1.0000 mg | Freq: Once | INTRAMUSCULAR | Status: AC
  Administered 2014-05-09: 1 mg via INTRAVENOUS
  Filled 2014-05-09: qty 1

## 2014-05-09 MED ORDER — PREDNISONE 10 MG PO TABS: 40.0000 mg | ORAL_TABLET | Freq: Every day | ORAL | Status: DC

## 2014-05-09 MED ORDER — ALBUTEROL (5 MG/ML) CONTINUOUS INHALATION SOLN
INHALATION_SOLUTION | RESPIRATORY_TRACT | Status: AC
  Administered 2014-05-09: 20 mg/h via RESPIRATORY_TRACT
  Filled 2014-05-09: qty 20

## 2014-05-09 MED ORDER — LEVOFLOXACIN IN D5W 750 MG/150ML IV SOLN
750.0000 mg | Freq: Once | INTRAVENOUS | Status: AC
  Administered 2014-05-09: 750 mg via INTRAVENOUS
  Filled 2014-05-09: qty 150

## 2014-05-09 MED ORDER — METHYLPREDNISOLONE SODIUM SUCC 125 MG IJ SOLR
125.0000 mg | Freq: Once | INTRAMUSCULAR | Status: AC
  Administered 2014-05-09: 125 mg via INTRAVENOUS
  Filled 2014-05-09: qty 2

## 2014-05-09 MED ORDER — KETOROLAC TROMETHAMINE 30 MG/ML IJ SOLN
30.0000 mg | Freq: Once | INTRAMUSCULAR | Status: AC
  Administered 2014-05-09: 30 mg via INTRAVENOUS
  Filled 2014-05-09: qty 1

## 2014-05-09 MED ORDER — LEVOFLOXACIN 500 MG PO TABS: 500.0000 mg | ORAL_TABLET | Freq: Every day | ORAL | Status: DC

## 2014-05-09 NOTE — ED Provider Notes (Signed)
CSN: [id number]     Arrival date & time 05/09/14  1453 History   None    Chief Complaint  Patient presents with  . Shortness of Breath  . Cough     (Consider location/radiation/quality/duration/timing/severity/associated sxs/prior Treatment) Patient is a 53 y.o. male presenting with shortness of breath and cough. The history is provided by the patient. No language interpreter was used.  Shortness of Breath Severity:  Severe Onset quality:  Gradual Duration:  2 weeks (worse since yesterday) Timing:  Constant Progression:  Worsening Chronicity:  Recurrent Context: URI   Context comment:  Copd Relieved by:  Nothing Worsened by:  Coughing Ineffective treatments:  Inhaler (prednisone) Associated symptoms: chest pain (with cough), cough, headaches, sputum production (yellow since yesterday) and wheezing   Associated symptoms: no abdominal pain, no diaphoresis, no fever, no hemoptysis and no vomiting   Risk factors: tobacco use   Risk factors: no hx of cancer, no hx of PE/DVT and no recent surgery   Cough Cough characteristics:  Productive Sputum characteristics:  Yellow Associated symptoms: chest pain (with cough), headaches, shortness of breath and wheezing   Associated symptoms: no diaphoresis and no fever     Past Medical History  Diagnosis Date  . COPD (chronic obstructive pulmonary disease)   . Pneumonia    Past Surgical History  Procedure Laterality Date  . Back surgery     No family history on file. History  Substance Use Topics  . Smoking status: Light Tobacco Smoker    Types: Cigarettes  . Smokeless tobacco: Not on file  . Alcohol Use: No    Review of Systems  Constitutional: Negative for fever and diaphoresis.  HENT: Negative for trouble swallowing and voice change.   Respiratory: Positive for cough, sputum production (yellow since yesterday), chest tightness, shortness of breath and wheezing. Negative for hemoptysis and stridor.   Cardiovascular: Positive  for chest pain (with cough). Negative for palpitations and leg swelling.  Gastrointestinal: Negative for nausea, vomiting and abdominal pain.  Musculoskeletal: Positive for back pain.  Neurological: Positive for headaches.  All other systems reviewed and are negative.     Allergies  Review of patient's allergies indicates no known allergies.  Home Medications   Prior to Admission medications   Medication Sig Start Date End Date Taking? Authorizing Provider  acetaminophen (TYLENOL) 500 MG tablet Take 1,000 mg by mouth every 6 (six) hours as needed for moderate pain.    Historical Provider, MD  albuterol (PROVENTIL HFA;VENTOLIN HFA) 108 (90 BASE) MCG/ACT inhaler Inhale 2 puffs into the lungs every 4 (four) hours as needed for wheezing or shortness of breath (cough). 05/05/14   Doug SouSam Jacubowitz, MD  albuterol (PROVENTIL) (2.5 MG/3ML) 0.083% nebulizer solution Take 2.5 mg by nebulization 4 (four) times daily.    Historical Provider, MD  Fluticasone-Salmeterol (ADVAIR DISKUS) 250-50 MCG/DOSE AEPB Inhale 1 puff into the lungs 2 (two) times daily. 04/24/14   Benny LennertJoseph L Zammit, MD  oxyCODONE-acetaminophen (PERCOCET/ROXICET) 5-325 MG per tablet Take 1 tablet by mouth every 4 (four) hours as needed for severe pain. 05/07/14   Joya Gaskinsonald W Wickline, MD  predniSONE (DELTASONE) 10 MG tablet Take 4 tablets (40 mg total) by mouth daily. 05/05/14   Doug SouSam Jacubowitz, MD  Spacer/Aero-Holding Chambers (AEROCHAMBER PLUS WITH MASK) inhaler Use as instructed 05/05/14   Doug SouSam Jacubowitz, MD   SpO2 96% Physical Exam  Constitutional: He is oriented to person, place, and time. He appears well-developed. He appears distressed.  HENT:  Head: Normocephalic.  Mouth/Throat: Oropharynx  is clear and moist.  Neck: No JVD present. No tracheal deviation present.  Cardiovascular: Regular rhythm, normal heart sounds and intact distal pulses.  Tachycardia present.   Pulmonary/Chest: Accessory muscle usage present. No stridor. Tachypnea  noted. He has wheezes. He has rhonchi.  Speaking 2-3 word sentences  Abdominal: Soft. There is no tenderness.  Musculoskeletal: He exhibits no edema.  Neurological: He is alert and oriented to person, place, and time.  Skin: Skin is warm.  Vitals reviewed.   ED Course  Procedures (including critical care time) Labs Review Labs Reviewed  CBC - Abnormal; Notable for the following:    WBC 13.7 (*)    All other components within normal limits  COMPREHENSIVE METABOLIC PANEL - Abnormal; Notable for the following:    Glucose, Bld 106 (*)    Albumin 3.3 (*)    Anion gap 16 (*)    All other components within normal limits  I-STAT VENOUS BLOOD GAS, ED - Abnormal; Notable for the following:    pH, Ven 7.396 (*)    pCO2, Ven 39.6 (*)    pO2, Ven 103.0 (*)    Bicarbonate 24.3 (*)    All other components within normal limits  CULTURE, BLOOD (ROUTINE X 2)  CULTURE, BLOOD (ROUTINE X 2)  I-STAT TROPOININ, ED  I-STAT CG4 LACTIC ACID, ED    Imaging Review Dg Chest 2 View  05/07/2014   CLINICAL DATA:  Initial evaluation for cough. Recently diagnosed with bronchitis. Worsening symptoms despite antibiotics.  EXAM: CHEST  2 VIEW  COMPARISON:  Prior radiograph from 05/05/2014  FINDINGS: The cardiac and mediastinal silhouettes are stable in size and contour, and remain within normal limits.  The lungs are normally inflated. No airspace consolidation, pleural effusion, or pulmonary edema is identified. There is no pneumothorax.  No acute osseous abnormality identified.  IMPRESSION: No active cardiopulmonary disease. Specifically, no focal infiltrate to suggest acute infectious pneumonitis.   Electronically Signed   By: Rise Mu M.D.   On: 05/07/2014 21:13     EKG Interpretation   Date/Time:  Saturday May 09 2014 14:59:51 EST Ventricular Rate:  102 PR Interval:  144 QRS Duration: 88 QT Interval:  328 QTC Calculation: 427 R Axis:   -6 Text Interpretation:  Sinus tachycardia  Otherwise normal ECG Similar to  prior Confirmed by Gwendolyn Grant  MD, BLAIR ([id number]) on 05/09/2014 3:29:21 PM      MDM   Final diagnoses:  Shortness of breath  Hypoxia    53 y/o male w h/o COPD (previously on O2 but none since moving here) with weeks of cough, SOB. Seen multiple times and currently on PO steroids. No h/o PE, DVT. Change in sputum production yesterday to yellow. No fevers. Notes back and chest pain due to cough. No EKG changes. Poor airmovement and diffuse wheezing/rhonchi w/ accessory muscle use. Sats 98% on RA. Getting nebs, steroids. Will need admission for COPD exacerbation if no PNA.   5:52 PM Pt completed nebs. Reports feeling better from respiratory point but persistent back and rib pain. Sats 96% on RA.  Discussed potential for PE as well as likely need for further nebs given persistent wheezing although airmovement improved. Gave dose of toradol. Pt reports that he does not want admission although I believe this would be the appropriate disposition. He requests abx and pain medications. Will give abx but given concern for respiratory status will not prescribe narcotics which I discussed with pt. He still declines admission.    Abagail Kitchens, MD  05/09/14 2240  Elwin MochaBlair Walden, MD 05/09/14 2309

## 2014-05-09 NOTE — ED Notes (Signed)
[facility] and [facility] with [facility].  Pt states he's been taking Rx with worsening symptoms.  Pt with audible wheezing and crackles.  Labored breathing, talking in broken sentences.

## 2014-05-09 NOTE — ED Notes (Signed)
RT note: Pt. Taken off CAT neb which was ran on oxygen sats 99%, sats dropped to 85% on room air within 7-10 minutes, sats >'d rapidly to 94-95% after being placed on by RN @ 4lpm n/c, RT to monitor.

## 2014-05-09 NOTE — ED Notes (Signed)
Pt signing out AMA, discussed the possible safety risk of leaving against medical advise. Pt verbalized understanding.

## 2014-05-13 ENCOUNTER — Inpatient Hospital Stay (HOSPITAL_COMMUNITY)
Admission: EM | Admit: 2014-05-13 | Discharge: 2014-05-16 | DRG: 190 | Disposition: A | Discharge status: Home / Self Care | Payer: Medicaid Other | Attending: Family Medicine | Admitting: Family Medicine

## 2014-05-13 ENCOUNTER — Encounter (HOSPITAL_COMMUNITY): Payer: Self-pay | Admitting: Emergency Medicine

## 2014-05-13 ENCOUNTER — Emergency Department (HOSPITAL_COMMUNITY): Payer: Medicaid Other

## 2014-05-13 DIAGNOSIS — J189 Pneumonia, unspecified organism: Secondary | ICD-10-CM

## 2014-05-13 DIAGNOSIS — J441 Chronic obstructive pulmonary disease with (acute) exacerbation: Secondary | ICD-10-CM | POA: Diagnosis present

## 2014-05-13 DIAGNOSIS — J9600 Acute respiratory failure, unspecified whether with hypoxia or hypercapnia: Secondary | ICD-10-CM

## 2014-05-13 DIAGNOSIS — G8929 Other chronic pain: Secondary | ICD-10-CM | POA: Diagnosis present

## 2014-05-13 DIAGNOSIS — F17210 Nicotine dependence, cigarettes, uncomplicated: Secondary | ICD-10-CM | POA: Diagnosis present

## 2014-05-13 DIAGNOSIS — E876 Hypokalemia: Secondary | ICD-10-CM | POA: Diagnosis present

## 2014-05-13 DIAGNOSIS — R079 Chest pain, unspecified: Secondary | ICD-10-CM | POA: Insufficient documentation

## 2014-05-13 DIAGNOSIS — F419 Anxiety disorder, unspecified: Secondary | ICD-10-CM | POA: Diagnosis present

## 2014-05-13 DIAGNOSIS — J209 Acute bronchitis, unspecified: Secondary | ICD-10-CM | POA: Diagnosis present

## 2014-05-13 DIAGNOSIS — Z8249 Family history of ischemic heart disease and other diseases of the circulatory system: Secondary | ICD-10-CM

## 2014-05-13 DIAGNOSIS — F17200 Nicotine dependence, unspecified, uncomplicated: Secondary | ICD-10-CM

## 2014-05-13 DIAGNOSIS — R0602 Shortness of breath: Secondary | ICD-10-CM | POA: Diagnosis present

## 2014-05-13 DIAGNOSIS — Z8701 Personal history of pneumonia (recurrent): Secondary | ICD-10-CM

## 2014-05-13 LAB — CBC
HCT: 44.8 % (ref 39.0–52.0)
Hemoglobin: 15.3 g/dL (ref 13.0–17.0)
MCH: 31.3 pg (ref 26.0–34.0)
MCHC: 34.2 g/dL (ref 30.0–36.0)
MCV: 91.6 fL (ref 78.0–100.0)
Platelets: 272 10*3/uL (ref 150–400)
RBC: 4.89 MIL/uL (ref 4.22–5.81)
RDW: 12.2 % (ref 11.5–15.5)
WBC: 15.3 10*3/uL — AB (ref 4.0–10.5)

## 2014-05-13 LAB — BLOOD GAS, ARTERIAL
ACID-BASE DEFICIT: 0.9 mmol/L (ref 0.0–2.0)
BICARBONATE: 22.9 meq/L (ref 20.0–24.0)
FIO2: 1 %
O2 CONTENT: 15 L/min
O2 Saturation: 99.3 %
PCO2 ART: 35.6 mmHg (ref 35.0–45.0)
PO2 ART: 282 mmHg — AB (ref 80.0–100.0)
Patient temperature: 37
TCO2: 19.7 mmol/L (ref 0–100)
pH, Arterial: 7.423 (ref 7.350–7.450)

## 2014-05-13 LAB — BASIC METABOLIC PANEL
Anion gap: 15 (ref 5–15)
BUN: 11 mg/dL (ref 6–23)
CO2: 25 mEq/L (ref 19–32)
CREATININE: 0.86 mg/dL (ref 0.50–1.35)
Calcium: 9.6 mg/dL (ref 8.4–10.5)
Chloride: 102 mEq/L (ref 96–112)
GFR calc Af Amer: >90 mL/min (ref >90)
GLUCOSE: 113 mg/dL — AB (ref 70–99)
Potassium: 3.5 mEq/L — ABNORMAL LOW (ref 3.7–5.3)
SODIUM: 142 meq/L (ref 137–147)

## 2014-05-13 LAB — TROPONIN I: Troponin I: <0.3 ng/mL (ref <0.30)

## 2014-05-13 LAB — MRSA PCR SCREENING: MRSA by PCR: NEGATIVE

## 2014-05-13 LAB — PRO B NATRIURETIC PEPTIDE: Pro B Natriuretic peptide (BNP): 120.7 pg/mL (ref 0–125)

## 2014-05-13 MED ORDER — CETYLPYRIDINIUM CHLORIDE 0.05 % MT LIQD
7.0000 mL | Freq: Two times a day (BID) | OROMUCOSAL | Status: DC
  Administered 2014-05-13 – 2014-05-16 (×5): 7 mL via OROMUCOSAL

## 2014-05-13 MED ORDER — IPRATROPIUM-ALBUTEROL 0.5-2.5 (3) MG/3ML IN SOLN
3.0000 mL | RESPIRATORY_TRACT | Status: DC
  Administered 2014-05-13 – 2014-05-16 (×16): 3 mL via RESPIRATORY_TRACT
  Filled 2014-05-13 (×16): qty 3

## 2014-05-13 MED ORDER — IPRATROPIUM-ALBUTEROL 0.5-2.5 (3) MG/3ML IN SOLN
RESPIRATORY_TRACT | Status: AC
  Administered 2014-05-13: 3 mL via RESPIRATORY_TRACT
  Filled 2014-05-13: qty 3

## 2014-05-13 MED ORDER — LEVOFLOXACIN IN D5W 750 MG/150ML IV SOLN
750.0000 mg | INTRAVENOUS | Status: DC
  Administered 2014-05-13 – 2014-05-15 (×3): 750 mg via INTRAVENOUS
  Filled 2014-05-13 (×3): qty 150

## 2014-05-13 MED ORDER — HYDROMORPHONE HCL 1 MG/ML IJ SOLN
2.0000 mg | INTRAMUSCULAR | Status: DC | PRN
Start: 2014-05-13 — End: 2014-05-14
  Administered 2014-05-13 – 2014-05-14 (×8): 2 mg via INTRAVENOUS
  Filled 2014-05-13 (×9): qty 2

## 2014-05-13 MED ORDER — ACETAMINOPHEN 325 MG PO TABS: 650.0000 mg | ORAL_TABLET | Freq: Four times a day (QID) | ORAL | Status: DC | PRN

## 2014-05-13 MED ORDER — SODIUM CHLORIDE 0.9 % IJ SOLN
3.0000 mL | Freq: Two times a day (BID) | INTRAMUSCULAR | Status: DC
  Administered 2014-05-13 – 2014-05-16 (×6): 3 mL via INTRAVENOUS

## 2014-05-13 MED ORDER — HYDROMORPHONE HCL 1 MG/ML IJ SOLN: 1.0000 mg | INTRAMUSCULAR | Status: DC | PRN

## 2014-05-13 MED ORDER — ACETAMINOPHEN 650 MG RE SUPP: 650.0000 mg | Freq: Four times a day (QID) | RECTAL | Status: DC | PRN

## 2014-05-13 MED ORDER — MORPHINE SULFATE 4 MG/ML IJ SOLN
4.0000 mg | Freq: Once | INTRAMUSCULAR | Status: AC
  Administered 2014-05-13: 4 mg via INTRAVENOUS
  Filled 2014-05-13: qty 1

## 2014-05-13 MED ORDER — ENOXAPARIN SODIUM 40 MG/0.4ML ~~LOC~~ SOLN
40.0000 mg | SUBCUTANEOUS | Status: DC
  Administered 2014-05-13 – 2014-05-15 (×3): 40 mg via SUBCUTANEOUS
  Filled 2014-05-13 (×3): qty 0.4

## 2014-05-13 MED ORDER — SODIUM CHLORIDE 0.9 % IV SOLN
250.0000 mL | INTRAVENOUS | Status: DC | PRN
  Administered 2014-05-13 – 2014-05-15 (×2): 250 mL via INTRAVENOUS

## 2014-05-13 MED ORDER — HYDROMORPHONE HCL 1 MG/ML IJ SOLN
0.5000 mg | INTRAMUSCULAR | Status: DC | PRN
  Administered 2014-05-13: 0.5 mg via INTRAVENOUS
  Filled 2014-05-13: qty 1

## 2014-05-13 MED ORDER — ASPIRIN 81 MG PO CHEW
324.0000 mg | CHEWABLE_TABLET | Freq: Once | ORAL | Status: AC
  Administered 2014-05-13: 324 mg via ORAL
  Filled 2014-05-13: qty 4

## 2014-05-13 MED ORDER — LORAZEPAM 2 MG/ML IJ SOLN
0.5000 mg | Freq: Four times a day (QID) | INTRAMUSCULAR | Status: DC | PRN
  Administered 2014-05-13 – 2014-05-15 (×3): 0.5 mg via INTRAVENOUS
  Filled 2014-05-13 (×3): qty 1

## 2014-05-13 MED ORDER — NICOTINE 21 MG/24HR TD PT24
21.0000 mg | MEDICATED_PATCH | Freq: Every day | TRANSDERMAL | Status: DC
  Administered 2014-05-13 – 2014-05-16 (×4): 21 mg via TRANSDERMAL
  Filled 2014-05-13 (×4): qty 1

## 2014-05-13 MED ORDER — METHYLPREDNISOLONE SODIUM SUCC 125 MG IJ SOLR
125.0000 mg | Freq: Once | INTRAMUSCULAR | Status: AC
  Administered 2014-05-13: 125 mg via INTRAVENOUS
  Filled 2014-05-13: qty 2

## 2014-05-13 MED ORDER — SODIUM CHLORIDE 0.9 % IJ SOLN: 3.0000 mL | INTRAMUSCULAR | Status: DC | PRN

## 2014-05-13 MED ORDER — CHLORHEXIDINE GLUCONATE 0.12 % MT SOLN
15.0000 mL | Freq: Two times a day (BID) | OROMUCOSAL | Status: DC
  Administered 2014-05-13 – 2014-05-15 (×4): 15 mL via OROMUCOSAL
  Filled 2014-05-13 (×4): qty 15

## 2014-05-13 MED ORDER — METHYLPREDNISOLONE SODIUM SUCC 40 MG IJ SOLR
40.0000 mg | Freq: Four times a day (QID) | INTRAMUSCULAR | Status: DC
  Administered 2014-05-13 – 2014-05-16 (×12): 40 mg via INTRAVENOUS
  Filled 2014-05-13 (×12): qty 1

## 2014-05-13 MED ORDER — ALBUTEROL SULFATE (2.5 MG/3ML) 0.083% IN NEBU
2.5000 mg | INHALATION_SOLUTION | Freq: Once | RESPIRATORY_TRACT | Status: AC
  Administered 2014-05-13: 2.5 mg via RESPIRATORY_TRACT
  Filled 2014-05-13: qty 3

## 2014-05-13 MED ORDER — IPRATROPIUM-ALBUTEROL 0.5-2.5 (3) MG/3ML IN SOLN
3.0000 mL | Freq: Once | RESPIRATORY_TRACT | Status: AC
  Administered 2014-05-13: 3 mL via RESPIRATORY_TRACT

## 2014-05-13 MED ORDER — POTASSIUM CHLORIDE 10 MEQ/100ML IV SOLN
10.0000 meq | INTRAVENOUS | Status: AC
  Administered 2014-05-13 (×4): 10 meq via INTRAVENOUS
  Filled 2014-05-13: qty 100

## 2014-05-13 MED ORDER — IPRATROPIUM-ALBUTEROL 0.5-2.5 (3) MG/3ML IN SOLN
RESPIRATORY_TRACT | Status: AC
  Filled 2014-05-13: qty 3

## 2014-05-13 MED ORDER — ALBUTEROL (5 MG/ML) CONTINUOUS INHALATION SOLN
10.0000 mg/h | INHALATION_SOLUTION | Freq: Once | RESPIRATORY_TRACT | Status: AC
  Administered 2014-05-13: 10 mg/h via RESPIRATORY_TRACT
  Filled 2014-05-13: qty 20

## 2014-05-13 MED ORDER — ONDANSETRON HCL 4 MG/2ML IJ SOLN: 4.0000 mg | Freq: Four times a day (QID) | INTRAMUSCULAR | Status: DC | PRN

## 2014-05-13 MED ORDER — ALBUTEROL SULFATE (2.5 MG/3ML) 0.083% IN NEBU: 2.5000 mg | INHALATION_SOLUTION | RESPIRATORY_TRACT | Status: DC | PRN

## 2014-05-13 MED ORDER — ONDANSETRON HCL 4 MG PO TABS: 4.0000 mg | ORAL_TABLET | Freq: Four times a day (QID) | ORAL | Status: DC | PRN

## 2014-05-13 NOTE — ED Notes (Signed)
Pt presents to ED with SOB, resp distress. [facility]. Pt reports prior hx of intubation.

## 2014-05-13 NOTE — ED Notes (Signed)
Pt states admitting doctor was going to order something else for pain. Pt is currently ready for transport. No new orders noted at this time. To let receiving nurse know.

## 2014-05-13 NOTE — Plan of Care (Signed)
Problem: ICU Phase Progression Outcomes Goal: O2 sats trending toward baseline Outcome: Progressing Currently on BiPaP at 40% with Sats in the mid to upper 90's Goal: Dyspnea controlled at rest Outcome: Progressing Occasionally becomes dyspneic at rest Goal: Hemodynamically stable Outcome: Progressing Vital Signs currently stable without medications Goal: Pain controlled with appropriate interventions Outcome: Progressing PRN medications ordered and being given as ordered Goal: Initial discharge plan identified Outcome: Completed/Met Date Met:  05/13/14 Home with family

## 2014-05-13 NOTE — Consult Note (Signed)
Full note to follow.  Try for better pain control and continue bipap

## 2014-05-13 NOTE — ED Provider Notes (Signed)
CSN: [id number]     Arrival date & time 05/13/14  1342 History   First MD Initiated Contact with Patient 05/13/14 1347     Chief Complaint  Patient presents with  . Shortness of Breath    HPI The patient presents to the emergency room with complaints of severe shortness of breath.the patient's symptoms have been ongoing for a couple weeks. He was first seen in the emergency department on October 23 and was treated for COPD exacerbation. He was seen again on November 3 for similar symptoms.he was seen 4 days ago on November 7 and at that time they recommended hospitalization and treatment for a COPD exacerbation with hypoxia. The patient refused hospitalization. He was hoping that he could manage this at home. He now presents in severe respiratory distress. The patient has a history of COPD. Unfortunately, continues to smoke cigarettes. He is having pain in the anterior part of his chest on both sides. The pain is severe and increases with breathing. He denies any numbness or weakness. No fevers. He has been coughing.  He has a history of severe COPD and has required intubation in the past. Past Medical History  Diagnosis Date  . COPD (chronic obstructive pulmonary disease)   . Pneumonia    Past Surgical History  Procedure Laterality Date  . Back surgery     History reviewed. No pertinent family history. History  Substance Use Topics  . Smoking status: Light Tobacco Smoker    Types: Cigarettes  . Smokeless tobacco: Not on file  . Alcohol Use: No    Review of Systems  All other systems reviewed and are negative.     Allergies  Vicodin  Home Medications   Prior to Admission medications   Medication Sig Start Date End Date Taking? Authorizing Provider  acetaminophen (TYLENOL) 500 MG tablet Take 1,000 mg by mouth every 6 (six) hours as needed for moderate pain.    Historical Provider, MD  albuterol (PROVENTIL HFA;VENTOLIN HFA) 108 (90 BASE) MCG/ACT inhaler Inhale 2 puffs into the  lungs every 4 (four) hours as needed for wheezing or shortness of breath (cough). 05/05/14   Doug SouSam Jacubowitz, MD  albuterol (PROVENTIL) (2.5 MG/3ML) 0.083% nebulizer solution Take 2.5 mg by nebulization 4 (four) times daily.    Historical Provider, MD  Fluticasone-Salmeterol (ADVAIR DISKUS) 250-50 MCG/DOSE AEPB Inhale 1 puff into the lungs 2 (two) times daily. 04/24/14   Benny LennertJoseph L Zammit, MD  levofloxacin (LEVAQUIN) 500 MG tablet Take 1 tablet (500 mg total) by mouth daily. 05/09/14   Abagail KitchensMegan Taylor, MD  oxyCODONE-acetaminophen (PERCOCET/ROXICET) 5-325 MG per tablet Take 1 tablet by mouth every 4 (four) hours as needed for severe pain. 05/07/14   Joya Gaskinsonald W Wickline, MD  predniSONE (DELTASONE) 10 MG tablet Take 4 tablets (40 mg total) by mouth daily. 05/09/14   Abagail KitchensMegan Taylor, MD  Spacer/Aero-Holding Chambers (AEROCHAMBER PLUS WITH MASK) inhaler Use as instructed 05/05/14   Doug SouSam Jacubowitz, MD   BP 142/93 mmHg  Pulse 87  Resp 28  Wt 203 lb (92.08 kg)  SpO2 97% Physical Exam  Constitutional: He appears well-developed and well-nourished. He appears distressed.  HENT:  Head: Normocephalic and atraumatic.  Right Ear: External ear normal.  Left Ear: External ear normal.  Eyes: Conjunctivae are normal. Right eye exhibits no discharge. Left eye exhibits no discharge. No scleral icterus.  Neck: Neck supple. No tracheal deviation present.  Cardiovascular: Normal rate, regular rhythm and intact distal pulses.   Pulmonary/Chest: Accessory muscle usage present. No  stridor. Tachypnea noted. He is in respiratory distress. He has decreased breath sounds (only speak a few words at a time). He has wheezes. He has no rales.  Abdominal: Soft. Bowel sounds are normal. He exhibits no distension. There is no tenderness. There is no rebound and no guarding.  Musculoskeletal: He exhibits no edema or tenderness.  Neurological: He is alert. He has normal strength. No cranial nerve deficit (no facial droop, extraocular movements  intact, no slurred speech) or sensory deficit. He exhibits normal muscle tone. He displays no seizure activity. Coordination normal.  Skin: Skin is warm and dry. No rash noted. He is not diaphoretic.  Facial plethora   Psychiatric: He has a normal mood and affect.  Nursing note and vitals reviewed.   ED Course  Procedures (including critical care time)   Labs Review Labs Reviewed  CBC - Abnormal; Notable for the following:    WBC 15.3 (*)    All other components within normal limits  BLOOD GAS, ARTERIAL - Abnormal; Notable for the following:    pO2, Arterial 282.0 (*)    All other components within normal limits  BASIC METABOLIC PANEL  PRO B NATRIURETIC PEPTIDE  I-STAT TROPOININ, ED    Imaging Review Dg Chest Port 1 View  05/13/2014   CLINICAL DATA:  Severe shortness of Breath  EXAM: PORTABLE CHEST - 1 VIEW  COMPARISON:  05/09/2014  FINDINGS: Cardiac shadow is stable. The lungs are well aerated bilaterally. Mild interstitial changes are noted. No focal confluent infiltrate is seen. The bony structures are within normal limits.  IMPRESSION: No acute abnormality noted.   Electronically Signed   By: Alcide Clever M.D.   On: 05/13/2014 14:11   CT angio of the chest on 11/7.  No PE   EKG Interpretation   Date/Time:  Wednesday May 13 2014 14:01:07 EST Ventricular Rate:  112 PR Interval:  152 QRS Duration: 84 QT Interval:  326 QTC Calculation: 445 R Axis:   -44 Text Interpretation:  Sinus tachycardia Multiple ventricular premature  complexes Left axis deviation Artifact in lead(s) II III aVR aVL aVF V5 No  significant change since last tracing Confirmed by Evadene Wardrip  MD-J, Lovell Nuttall  ([id number]) on 05/13/2014 2:06:28 PM     Medications  albuterol (PROVENTIL,VENTOLIN) solution continuous neb (10 mg/hr Nebulization Given 05/13/14 1430)  ipratropium-albuterol (DUONEB) 0.5-2.5 (3) MG/3ML nebulizer solution (not administered)  morphine 4 MG/ML injection 4 mg (not administered)   methylPREDNISolone sodium succinate (SOLU-MEDROL) 125 mg/2 mL injection 125 mg (125 mg Intravenous Given 05/13/14 1357)  albuterol (PROVENTIL) (2.5 MG/3ML) 0.083% nebulizer solution 2.5 mg (2.5 mg Nebulization Given 05/13/14 1418)  morphine 4 MG/ML injection 4 mg (4 mg Intravenous Given 05/13/14 1357)  aspirin chewable tablet 324 mg (324 mg Oral Given 05/13/14 1357)  ipratropium-albuterol (DUONEB) 0.5-2.5 (3) MG/3ML nebulizer solution 3 mL (3 mLs Nebulization Given 05/13/14 1419)   CRITICAL CARE Performed by: UEAVW,UJW Total critical care time: 30 Critical care time was exclusive of separately billable procedures and treating other patients. Critical care was necessary to treat or prevent imminent or life-threatening deterioration. Critical care was time spent personally by me on the following activities: development of treatment plan with patient and/or surrogate as well as nursing, discussions with consultants, evaluation of patient's response to treatment, examination of patient, obtaining history from patient or surrogate, ordering and performing treatments and interventions, ordering and review of laboratory studies, ordering and review of radiographic studies, pulse oximetry and re-evaluation of patient's condition.  MDM  Final diagnoses:  COPD exacerbation   Patient appeared to be in severe distress when first arrived. He was only able to speak in few word sentences. He is complaining of bilateral chest pain and was requesting something for pain.  I reviewed his several recent visits to the emergency department.  This is the fourth visit in the last few weeks.patient was just seen 4 days ago and they recommended hospitalization.  Patient was treated with albuterol Atrovent, steroids and pain medications he is persistently tachypnea. His oxygenation is normal however, his ABG does not show any hypercapnia.  There may be an anxiety and pain component to his tachypnea in addition to his COPD  exacerbation.  I consult with the medical service regarding admission.    Linwood DibblesJon Ellanore Vanhook, MD 05/13/14 [phone]

## 2014-05-13 NOTE — H&P (Signed)
History and Physical  Gadiel John EAV:[id number] DOB: 1960/08/26 DOA: 05/13/2014  Referring physician: Linwood Dibbles, MD in ED PCP: No PCP Per Patient   Chief Complaint: SOB  HPI:  53 year old man presented to ED with 2+ week history of shortness of breath and constant chest pain. Placed on BiPAP in the emergency department for respiratory distress in admitted for further evaluation of acute respiratory failure secondary to COPD exacerbation.  Patient recently moved here, has no PCP. He reports a history of emphysema with 2 intubations in the last year. He was seen in ED for SOB and chest pain 10/23, 11/3, 11/5 and 11/7.on most recent evaluation hospitalization was recommended but the patient refused.  He reports 2 week plus history of ongoing and increasing shortness of breath, wheezing and chest wall soreness from severe coughing episodes. Chest pain is made worse by palpation of the chest wall. No alleviating factors noted.he has been treated with steroids and antibiotics in the past but could not afford the antibiotics and steroids have not helped his condition. He continues to smoke 1 pack per day although he used to smoke 3 packs per day. He has had no fever or systemic symptoms and has no other complaints.he reports anxiety.  In the emergency department treated with nebulizer therapy, Solu-Medrol, morphine.afebrilewithout hypoxia recorded.vital signs stable, tachypnic in the 20s.ABG 7.42/35/282. Basic metabolic panel unremarkable.potassium 3.5. WBC 15.3 (on steroids). Chest x-ray no acute disease.EKG sinus rhythm with multiple PVCs. No acute changes.CT angiogram of the chest performed 11/7with no evidence of PE.  Review of Systems:  Negative for fever, visual changes, sore throat, rash, new muscle aches, dysuria, bleeding, n/v/abdominal pain.  Past Medical History  Diagnosis Date  . COPD (chronic obstructive pulmonary disease)   . Pneumonia     Past Surgical History  Procedure  Laterality Date  . Back surgery      low back x4  . Bilateral carpal tunnel release    . Ulnar nerve transposition      ? by description, not clear  . Bilteral knee surgery      Social History:  reports that he has been smoking Cigarettes.  He has been smoking about 0.00 packs per day. He does not have any smokeless tobacco history on file. He reports that he does not drink alcohol or use illicit drugs.  Allergies  Allergen Reactions  . Vicodin [Hydrocodone-Acetaminophen] Other (See Comments)    Made patient feel funny    Family History  Problem Relation Age of Onset  . Heart attack Father      Prior to Admission medications   Medication Sig Start Date End Date Taking? Authorizing Provider  acetaminophen (TYLENOL) 500 MG tablet Take 1,000 mg by mouth every 6 (six) hours as needed for moderate pain.    Historical Provider, MD  albuterol (PROVENTIL HFA;VENTOLIN HFA) 108 (90 BASE) MCG/ACT inhaler Inhale 2 puffs into the lungs every 4 (four) hours as needed for wheezing or shortness of breath (cough). 05/05/14   Doug Sou, MD  albuterol (PROVENTIL) (2.5 MG/3ML) 0.083% nebulizer solution Take 2.5 mg by nebulization 4 (four) times daily.    Historical Provider, MD  Fluticasone-Salmeterol (ADVAIR DISKUS) 250-50 MCG/DOSE AEPB Inhale 1 puff into the lungs 2 (two) times daily. 04/24/14   Benny Lennert, MD  levofloxacin (LEVAQUIN) 500 MG tablet Take 1 tablet (500 mg total) by mouth daily. 05/09/14   Abagail Kitchens, MD  oxyCODONE-acetaminophen (PERCOCET/ROXICET) 5-325 MG per tablet Take 1 tablet by mouth every 4 (four) hours  as needed for severe pain. 05/07/14   Joya Gaskinsonald W Wickline, MD  predniSONE (DELTASONE) 10 MG tablet Take 4 tablets (40 mg total) by mouth daily. 05/09/14   Abagail KitchensMegan Taylor, MD  Spacer/Aero-Holding Chambers (AEROCHAMBER PLUS WITH MASK) inhaler Use as instructed 05/05/14   Doug SouSam Jacubowitz, MD   Physical Exam: Filed Vitals:   05/13/14 1415 05/13/14 1419 05/13/14 1420 05/13/14 1423    BP:  142/93    Pulse: 96 87    Resp: 25 25  28   Weight:   92.08 kg (203 lb)   SpO2: 100% 100% 100% 97%    General: examined in the emergency department. Appears anxious, uncomfortable but not toxic. Eyes: PERRL, normal lids, irises  ENT: grossly normal hearing. On BiPAP. Neck: no LAD, masses or thyromegaly Cardiovascular: RRR, no m/r/g. No LE edema. Respiratory: diffuse wheezes bilaterally expiratory phase. No rhonchi or rales. Moderate increased respiratory effort. Able to speak in full sentences. Abdomen: soft, ntnd Skin: no rash or induration seen. Multiple tattoos. Musculoskeletal: grossly normal tone BUE/BLE Psychiatric: grossly normal mood and affect, speech fluent and appropriate Neurologic: grossly non-focal.  Wt Readings from Last 3 Encounters:  05/13/14 92.08 kg (203 lb)  05/07/14 92.08 kg (203 lb)  05/05/14 96.616 kg (213 lb)    Labs on Admission:  Basic Metabolic Panel:  Recent Labs Lab 05/09/14 1515 05/13/14 1352  NA 139 142  K 3.9 3.5*  CL 101 102  CO2 22 25  GLUCOSE 106* 113*  BUN 12 11  CREATININE 0.78 0.86  CALCIUM 9.1 9.6    Liver Function Tests:  Recent Labs Lab 05/09/14 1515  AST 21  ALT 27  ALKPHOS 85  BILITOT 0.7  PROT 6.7  ALBUMIN 3.3*    CBC:  Recent Labs Lab 05/09/14 1515 05/13/14 1352  WBC 13.7* 15.3*  HGB 13.7 15.3  HCT 41.7 44.8  MCV 91.9 91.6  PLT 203 272     Recent Labs  04/24/14 1144 05/13/14 1352  PROBNP 45.3 120.7    Radiological Exams on Admission: Dg Chest Port 1 View  05/13/2014   CLINICAL DATA:  Severe shortness of Breath  EXAM: PORTABLE CHEST - 1 VIEW  COMPARISON:  05/09/2014  FINDINGS: Cardiac shadow is stable. The lungs are well aerated bilaterally. Mild interstitial changes are noted. No focal confluent infiltrate is seen. The bony structures are within normal limits.  IMPRESSION: No acute abnormality noted.   Electronically Signed   By: Alcide CleverMark  Lukens M.D.   On: 05/13/2014 14:11       Principal Problem:   COPD exacerbation Active Problems:   Acute respiratory failure   Tobacco dependence   Assessment/Plan 1. Acute respiratory failure without hypoxia or hypercapnia at this point secondary to COPD exacerbation. 2. Acute COPD exacerbation, acute bronchitis, Consider CAP.recent CT angiogram of the chest suggested atypical infection. Patient could not afford antibiotics as an outpatient. 3. Ongoing chest wall pain present for 2 weeks. Exquisitely tender to palpation anterior chest wall. EKG unremarkable. No evidence to suggest ACS. 4. Hypokalemia. 5. Tobacco dependence.   Plan admission to the stepdown unit for treatment of the above. Continue BiPAP, wean as tolerated. Start antibiotics, continue steroids, bronchodilators. Will consult pulmonology. Currently respiratory rate in the 20s, able speak in full sentences, no acute need for intubation.  Given recent CT angiogramof the chest, no further imaging will be pursued atthis time.  Follow-up troponin which is pending  Recommend smoking cessation.  Code Status: full code  DVT prophylaxis:Lovenox Family Communication: none present  Disposition Plan/Anticipated LOS: admit, 3 days  Time spent: 60 minutes  Brendia Sacksaniel Goodrich, MD  [facility] Pager [phone] 05/13/2014, 2:58 PM

## 2014-05-14 LAB — BASIC METABOLIC PANEL
Anion gap: 13 (ref 5–15)
BUN: 13 mg/dL (ref 6–23)
CHLORIDE: 103 meq/L (ref 96–112)
CO2: 25 meq/L (ref 19–32)
CREATININE: 0.75 mg/dL (ref 0.50–1.35)
Calcium: 8.9 mg/dL (ref 8.4–10.5)
GFR calc Af Amer: >90 mL/min (ref >90)
GFR calc non Af Amer: >90 mL/min (ref >90)
Glucose, Bld: 144 mg/dL — ABNORMAL HIGH (ref 70–99)
Potassium: 4.7 mEq/L (ref 3.7–5.3)
Sodium: 141 mEq/L (ref 137–147)

## 2014-05-14 LAB — CBC
HCT: 40.4 % (ref 39.0–52.0)
HEMOGLOBIN: 13.3 g/dL (ref 13.0–17.0)
MCH: 30.6 pg (ref 26.0–34.0)
MCHC: 32.9 g/dL (ref 30.0–36.0)
MCV: 93.1 fL (ref 78.0–100.0)
Platelets: 253 10*3/uL (ref 150–400)
RBC: 4.34 MIL/uL (ref 4.22–5.81)
RDW: 12.4 % (ref 11.5–15.5)
WBC: 13 10*3/uL — ABNORMAL HIGH (ref 4.0–10.5)

## 2014-05-14 LAB — POCT I-STAT TROPONIN I: Troponin i, poc: 0.02 ng/mL (ref 0.00–0.08)

## 2014-05-14 LAB — TROPONIN I

## 2014-05-14 MED ORDER — LACTULOSE 10 GM/15ML PO SOLN
10.0000 g | Freq: Every day | ORAL | Status: DC | PRN
  Administered 2014-05-15 – 2014-05-16 (×2): 10 g via ORAL
  Filled 2014-05-14 (×2): qty 30

## 2014-05-14 MED ORDER — OXYCODONE-ACETAMINOPHEN 5-325 MG PO TABS
1.0000 | ORAL_TABLET | ORAL | Status: DC | PRN
  Administered 2014-05-14 – 2014-05-16 (×12): 1 via ORAL
  Filled 2014-05-14 (×12): qty 1

## 2014-05-14 MED ORDER — HYDROMORPHONE HCL 1 MG/ML IJ SOLN: 1.0000 mg | INTRAMUSCULAR | Status: DC | PRN

## 2014-05-14 MED ORDER — GUAIFENESIN ER 600 MG PO TB12
1200.0000 mg | ORAL_TABLET | Freq: Two times a day (BID) | ORAL | Status: DC
  Administered 2014-05-14 – 2014-05-16 (×5): 1200 mg via ORAL
  Filled 2014-05-14 (×5): qty 2

## 2014-05-14 MED ORDER — OXYCODONE HCL 5 MG PO TABS
5.0000 mg | ORAL_TABLET | ORAL | Status: DC | PRN
  Administered 2014-05-14 – 2014-05-16 (×12): 5 mg via ORAL
  Filled 2014-05-14 (×12): qty 1

## 2014-05-14 NOTE — Progress Notes (Signed)
Subjective: He says he feels better. He has no new complaints. His breathing is better. His major concern is pain and anxiety medications.  Objective: Vital signs in last 24 hours: Temp:  [96.5 F (35.8 C)-97.9 F (36.6 C)] 97.9 F (36.6 C) (11/12 0747) Pulse Rate:  [49-120] 51 (11/12 0700) Resp:  [8-29] 12 (11/12 0700) BP: (104-187)/(58-175) 104/66 mmHg (11/12 0700) SpO2:  [96 %-100 %] 96 % (11/12 0801) FiO2 (%):  [35 %-60 %] 35 % (11/12 0400) Weight:  [79.3 kg (174 lb 13.2 oz)-92.08 kg (203 lb)] 81.9 kg (180 lb 8.9 oz) (11/12 0500) Weight change:  Last BM Date: 05/13/14  Intake/Output from previous day: 11/11 0701 - 11/12 0700 In: 383 [I.V.:133; IV Piggyback:250] Out: 1100 [Urine:1100]  PHYSICAL EXAM General appearance: alert, cooperative and mild distress Resp: rhonchi bilaterally Cardio: regular rate and rhythm, S1, S2 normal, no murmur, click, rub or gallop GI: soft, non-tender; bowel sounds normal; no masses,  no organomegaly Extremities: extremities normal, atraumatic, no cyanosis or edema  Lab Results:  Results for orders placed or performed during the hospital encounter of 05/13/14 (from the past 48 hour(s))  Basic metabolic panel     Status: Abnormal   Collection Time: 05/13/14  1:52 PM  Result Value Ref Range   Sodium 142 137 - 147 mEq/L   Potassium 3.5 (L) 3.7 - 5.3 mEq/L   Chloride 102 96 - 112 mEq/L   CO2 25 19 - 32 mEq/L   Glucose, Bld 113 (H) 70 - 99 mg/dL   BUN 11 6 - 23 mg/dL   Creatinine, Ser 0.86 0.50 - 1.35 mg/dL   Calcium 9.6 8.4 - 10.5 mg/dL   GFR calc non Af Amer >90 >90 mL/min   GFR calc Af Amer >90 >90 mL/min    Comment: (NOTE) The eGFR has been calculated using the CKD EPI equation. This calculation has not been validated in all clinical situations. eGFR's persistently <90 mL/min signify possible Chronic Kidney Disease.    Anion gap 15 5 - 15  CBC     Status: Abnormal   Collection Time: 05/13/14  1:52 PM  Result Value Ref Range   WBC  15.3 (H) 4.0 - 10.5 K/uL   RBC 4.89 4.22 - 5.81 MIL/uL   Hemoglobin 15.3 13.0 - 17.0 g/dL   HCT 44.8 39.0 - 52.0 %   MCV 91.6 78.0 - 100.0 fL   MCH 31.3 26.0 - 34.0 pg   MCHC 34.2 30.0 - 36.0 g/dL   RDW 12.2 11.5 - 15.5 %   Platelets 272 150 - 400 K/uL  Pro b natriuretic peptide     Status: None   Collection Time: 05/13/14  1:52 PM  Result Value Ref Range   Pro B Natriuretic peptide (BNP) 120.7 0 - 125 pg/mL  Blood gas, arterial ([facility] & [facility] ONLY)     Status: Abnormal   Collection Time: 05/13/14  1:55 PM  Result Value Ref Range   FIO2 1.00 %   O2 Content 15.0 L/min   Delivery systems NONREBREATHER    pH, Arterial 7.423 7.350 - 7.450   pCO2 arterial 35.6 35.0 - 45.0 mmHg   pO2, Arterial 282.0 (H) 80.0 - 100.0 mmHg   Bicarbonate 22.9 20.0 - 24.0 mEq/L   TCO2 19.7 0 - 100 mmol/L   Acid-base deficit 0.9 0.0 - 2.0 mmol/L   O2 Saturation 99.3 %   Patient temperature 37.0    Collection site RIGHT RADIAL    Drawn by Harlow Asa,  RRT, RCP    Sample type ARTERIAL DRAW    Allens test (pass/fail) PASS PASS  POCT i-Stat troponin I     Status: None   Collection Time: 05/13/14  2:39 PM  Result Value Ref Range   Troponin i, poc 0.02 0.00 - 0.08 ng/mL   Comment 3            Comment: Due to the release kinetics of cTnI, a negative result within the first hours of the onset of symptoms does not rule out myocardial infarction with certainty. If myocardial infarction is still suspected, repeat the test at appropriate intervals.   MRSA PCR Screening     Status: None   Collection Time: 05/13/14  5:46 PM  Result Value Ref Range   MRSA by PCR NEGATIVE NEGATIVE    Comment:        The GeneXpert MRSA Assay (FDA approved for NASAL specimens only), is one component of a comprehensive MRSA colonization surveillance program. It is not intended to diagnose MRSA infection nor to guide or monitor treatment for MRSA infections.   Troponin I     Status: None   Collection Time: 05/13/14  7:42  PM  Result Value Ref Range   Troponin I <0.30 <0.30 ng/mL    Comment:        Due to the release kinetics of cTnI, a negative result within the first hours of the onset of symptoms does not rule out myocardial infarction with certainty. If myocardial infarction is still suspected, repeat the test at appropriate intervals.   Basic metabolic panel     Status: Abnormal   Collection Time: 05/14/14  5:47 AM  Result Value Ref Range   Sodium 141 137 - 147 mEq/L   Potassium 4.7 3.7 - 5.3 mEq/L    Comment: DELTA CHECK NOTED   Chloride 103 96 - 112 mEq/L   CO2 25 19 - 32 mEq/L   Glucose, Bld 144 (H) 70 - 99 mg/dL   BUN 13 6 - 23 mg/dL   Creatinine, Ser 0.75 0.50 - 1.35 mg/dL   Calcium 8.9 8.4 - 10.5 mg/dL   GFR calc non Af Amer >90 >90 mL/min   GFR calc Af Amer >90 >90 mL/min    Comment: (NOTE) The eGFR has been calculated using the CKD EPI equation. This calculation has not been validated in all clinical situations. eGFR's persistently <90 mL/min signify possible Chronic Kidney Disease.    Anion gap 13 5 - 15  CBC     Status: Abnormal   Collection Time: 05/14/14  5:47 AM  Result Value Ref Range   WBC 13.0 (H) 4.0 - 10.5 K/uL   RBC 4.34 4.22 - 5.81 MIL/uL   Hemoglobin 13.3 13.0 - 17.0 g/dL   HCT 40.4 39.0 - 52.0 %   MCV 93.1 78.0 - 100.0 fL   MCH 30.6 26.0 - 34.0 pg   MCHC 32.9 30.0 - 36.0 g/dL   RDW 12.4 11.5 - 15.5 %   Platelets 253 150 - 400 K/uL    ABGS  Recent Labs  05/13/14 1355  PHART 7.423  PO2ART 282.0*  TCO2 19.7  HCO3 22.9   CULTURES Recent Results (from the past 240 hour(s))  Culture, blood (routine x 2)     Status: None (Preliminary result)   Collection Time: 05/09/14  3:09 PM  Result Value Ref Range Status   Specimen Description BLOOD LEFT ANTECUBITAL  Final   Special Requests BOTTLES DRAWN AEROBIC AND ANAEROBIC [place]  Final   Culture  Setup Time   Final    05/10/2014 00:57 Performed at [facility]    Culture   Final           BLOOD  CULTURE RECEIVED NO GROWTH TO DATE CULTURE WILL BE HELD FOR 5 DAYS BEFORE ISSUING A FINAL NEGATIVE REPORT Performed at [facility]    Report Status PENDING  Incomplete  Culture, blood (routine x 2)     Status: None (Preliminary result)   Collection Time: 05/09/14  3:15 PM  Result Value Ref Range Status   Specimen Description BLOOD RIGHT ANTECUBITAL  Final   Special Requests BOTTLES DRAWN AEROBIC AND ANAEROBIC 5CC EACH  Final   Culture  Setup Time   Final    05/10/2014 00:57 Performed at [facility]    Culture   Final           BLOOD CULTURE RECEIVED NO GROWTH TO DATE CULTURE WILL BE HELD FOR 5 DAYS BEFORE ISSUING A FINAL NEGATIVE REPORT Performed at [facility]    Report Status PENDING  Incomplete  MRSA PCR Screening     Status: None   Collection Time: 05/13/14  5:46 PM  Result Value Ref Range Status   MRSA by PCR NEGATIVE NEGATIVE Final    Comment:        The GeneXpert MRSA Assay (FDA approved for NASAL specimens only), is one component of a comprehensive MRSA colonization surveillance program. It is not intended to diagnose MRSA infection nor to guide or monitor treatment for MRSA infections.    Studies/Results: Dg Chest Port 1 View  05/13/2014   CLINICAL DATA:  Severe shortness of Breath  EXAM: PORTABLE CHEST - 1 VIEW  COMPARISON:  05/09/2014  FINDINGS: Cardiac shadow is stable. The lungs are well aerated bilaterally. Mild interstitial changes are noted. No focal confluent infiltrate is seen. The bony structures are within normal limits.  IMPRESSION: No acute abnormality noted.   Electronically Signed   By: Inez Catalina M.D.   On: 05/13/2014 14:11    Medications:  Prior to Admission:  Prescriptions prior to admission  Medication Sig Dispense Refill Last Dose  . acetaminophen (TYLENOL) 500 MG tablet Take 1,000 mg by mouth every 6 (six) hours as needed for moderate pain.   unknown  . albuterol (PROVENTIL HFA;VENTOLIN HFA) 108 (90 BASE) MCG/ACT  inhaler Inhale 2 puffs into the lungs every 4 (four) hours as needed for wheezing or shortness of breath (cough). 1 Inhaler 0 unknown  . albuterol (PROVENTIL) (2.5 MG/3ML) 0.083% nebulizer solution Take 2.5 mg by nebulization 4 (four) times daily.   unknown  . Fluticasone-Salmeterol (ADVAIR DISKUS) 250-50 MCG/DOSE AEPB Inhale 1 puff into the lungs 2 (two) times daily. 60 each 1 unknown  . levofloxacin (LEVAQUIN) 500 MG tablet Take 1 tablet (500 mg total) by mouth daily. 7 tablet 0 unknown  . oxyCODONE-acetaminophen (PERCOCET/ROXICET) 5-325 MG per tablet Take 1 tablet by mouth every 4 (four) hours as needed for severe pain. 5 tablet 0 unknown  . predniSONE (DELTASONE) 10 MG tablet Take 4 tablets (40 mg total) by mouth daily. 20 tablet 0 unknown  . Spacer/Aero-Holding Chambers (AEROCHAMBER PLUS WITH MASK) inhaler Use as instructed 1 each 2 unknown   Scheduled: . antiseptic oral rinse  7 mL Mouth Rinse q12n4p  . chlorhexidine  15 mL Mouth Rinse BID  . enoxaparin (LOVENOX) injection  40 mg Subcutaneous Q24H  . ipratropium-albuterol  3 mL Nebulization Q4H  . levofloxacin (  LEVAQUIN) IV  750 mg Intravenous Q24H  . methylPREDNISolone (SOLU-MEDROL) injection  40 mg Intravenous Q6H  . nicotine  21 mg Transdermal Daily  . sodium chloride  3 mL Intravenous Q12H   Continuous:  FWY:OVZCHY chloride, acetaminophen **OR** acetaminophen, albuterol, HYDROmorphone (DILAUDID) injection, LORazepam, ondansetron **OR** ondansetron (ZOFRAN) IV, sodium chloride  Assesment:he was admitted with COPD exacerbation and acute respiratory failure. He has chronic pain and anxiety which is complicating his treatment. He is significantly better and I think we can try him off BiPAP  Principal Problem:   COPD exacerbation Active Problems:   Acute respiratory failure   Tobacco dependence    Plan:I think we can try him off BiPAP and see how he does    LOS: 1 day   Kineta Fudala L 05/14/2014, 8:27 AM

## 2014-05-14 NOTE — Consult Note (Signed)
NAME:  [name] EvansCADORETTE, Schneider               ACCOUNT NO.:  [id number]  MEDICAL RECORD NO.:  [id number]  LOCATION:  [facility]08                          FACILITY:  [facility]  PHYSICIAN:  Adalyne Lovick L. Juanetta GoslingHawkins, M.D.DATE OF BIRTH:  [birth year]  DATE OF CONSULTATION:  05/13/2014 DATE OF DISCHARGE:                                CONSULTATION   HISTORY OF PRESENT ILLNESS:  This is a 53 year old, who came to the emergency room with shortness of breath and chest pain.  He got started on BiPAP in the emergency room.  He is known to have COPD.  He says that he has had to be intubated twice in the last year.  He says that he is having a lot of pain.  PAST MEDICAL HISTORY:  Positive for COPD and pneumonia, intubation x2.  PAST SURGICAL HISTORY:  Surgically, he has had back surgery on 4 times bilateral carpal tunnel release, ulnar nerve transposition and bilateral knee surgery.  SOCIAL HISTORY:  He has about a 60 pack-year smoking history and continues to smoke about a package of cigarettes daily.  He does not use any alcohol or illicit drugs.  FAMILY HISTORY:  Positive for heart disease in his father.  MEDICATION LIST:  Per the chart and apparently is accurate.  He cannot give me a medication list now.  PHYSICAL EXAMINATION:  GENERAL:  He is a well-developed, well-nourished, anxious male who is on BiPAP. HEENT:  Pupils are reactive.  Nose and throat are clear. NECK:  Supple without masses. CHEST:  His heart is regular.  I do not hear a murmur, gallop, or rub. Chest shows wheezing bilaterally, but he is able to talk.  He has BiPAP on. EXTREMITIES:  He does not have any edema. ABDOMEN:  Soft, nontender. CENTRAL NERVOUS SYSTEM:  Grossly positive for anxiety.  LABORATORY WORK:  Acute respiratory failure with COPD exacerbation.  He is being treated for pneumonia.  He has had chest wall pain.  He tells me that the last 2 times he got sick like this, he had to be intubated and placed on mechanical ventilation and he  requested if his pain cannot be brought under control with that be done.  I told him we are going to see what we can do with his pain medication, but he does not need intubation and mechanical ventilation at this point.  Thank you for allowing me to see him with you.     Kinney Sackmann L. Juanetta GoslingHawkins, M.D.     ELH/MEDQ  D:  05/13/2014  T:  05/14/2014  Job:  [id number]

## 2014-05-14 NOTE — Clinical Social Work Note (Signed)
CSW met with patient related to a referral made indicating that patient was homeless and needed housing assistance. Patient indicated that he was not homeless.  He indicated that he, his wife ([name] Carter) and two of his grandchildren that he is raising recently moved here (five months ago) from [place]. Patient indicated that he and his family live in a trailer located at [address], [place].  He indicated that his main issue is was lack of health insurance and difficulty obtaining his medications.  Case management to address PCP and medication conserns with patient.    CSW signing off.  Heather Settle, LCSW [phone] 

## 2014-05-14 NOTE — Progress Notes (Signed)
Nutrition Brief Note  Patient identified on the Malnutrition Screening Tool (MST) Report  Wt Readings from Last 15 Encounters:  05/14/14 180 lb 8.9 oz (81.9 kg)  05/07/14 203 lb (92.08 kg)  05/05/14 213 lb (96.616 kg)  04/24/14 212 lb (96.163 kg)  03/16/14 200 lb (90.53719 kg)   53 year old man presented to ED with 2+ week history of shortness of breath and constant chest pain. Placed on BiPAP in the emergency department for respiratory distress in admitted for further evaluation of acute respiratory failure secondary to COPD exacerbation.  Spoke with RN who reports pt appears well-nourished and has a very good appetite. Pt reports poor appetite for 3 days PTA due to breathing difficulty and difficulty swallowing. However, this has resolved and he reports very good appetite at baseline. He reports UBW around 210#, Question if 180# wt is an outlier. Nutrition-focused exam reveals no signs of fat or muscle depletion.   Body mass index is 26.65 kg/(m^2). Patient meets criteria for overweight based on current BMI.   Current diet order is regular, patient is consuming approximately 100% of meals at this time. Labs and medications reviewed.   No nutrition interventions warranted at this time. If nutrition issues arise, please consult RD.   Takai Chiaramonte A. Mayford KnifeWilliams, RD, LDN Pager: [phone]

## 2014-05-14 NOTE — Care Management Note (Addendum)
    Page 1 of 1   05/15/2014     1:54:49 PM CARE MANAGEMENT NOTE 05/15/2014  Patient:  [name] Carter,[name] Carter   Account Number:  [id number]  Date Initiated:  05/14/2014  Documentation initiated by:  Kathyrn SheriffHILDRESS,JESSICA  Subjective/Objective Assessment:   Pt admitted for SOB. Pt recently moved from [place] and lives with wife and 2 children. Pt is uninsured following move dispite wifes attempts to enroll him. Pt has no HH services and has a neb. machine. Pt has difficulty paying for medications.     Action/Plan:   Pt has no PCP. Pt plans to discharge home with self care. Referral made to financial councelor for assistance enrolling in insurance. [facility] and pt given [facility] $4 list of medications.   Anticipated DC Date:  05/16/2014   Anticipated DC Plan:  HOME/SELF CARE  In-house referral  Financial Counselor      DC Planning Services  CM consult  Follow-up appt scheduled  PCP issues  Medication Assistance  [facility]      Choice offered to / List presented to:             Status of service:  Completed, signed off Medicare Important Message given?   (If response is "NO", the following Medicare IM given date fields will be blank) Date Medicare IM given:   Medicare IM given by:   Date Additional Medicare IM given:   Additional Medicare IM given by:    Discharge Disposition:  HOME/SELF CARE  Per UR Regulation:    If discussed at Long Length of Stay Meetings, dates discussed:    Comments:  05/15/2014 1330 Kathyrn SheriffJessica Childress, RN, MSN, PCCN [facility] over [facility]. Pt has no further CM needs at this time.  05/14/2014 1400 Kathyrn SheriffJessica Childress, RN, MSN, PCCN Pt and Pt's RN aware of discharge planning. Pt may need MATCH voucher at discarge, will follow for CM needs.

## 2014-05-14 NOTE — Care Management Utilization Note (Signed)
UR review complete.  

## 2014-05-14 NOTE — Progress Notes (Signed)
Patient came off bipap this morning. Patient is currently on [facility], pt says he feels his breathing is good he is just in a lot of pain. Asked if he wanted to wear BIPAP tonight and he stated not unless he has too. RT will continue to monitor.

## 2014-05-14 NOTE — Progress Notes (Signed)
  PROGRESS NOTE  Sherlynn StallsLeo Douty WUJ:[id number] DOB: February 05, 1961 DOA: 05/13/2014 PCP: No PCP Per Patient  Summary: 53 year old man presented to ED with 2+ week history of shortness of breath and constant chest pain. Placed on BiPAP in the emergency department for respiratory distress in admitted for further evaluation of acute respiratory failure secondary to COPD exacerbation.  Assessment/Plan: 1. Acute respiratory failure without hypoxia or hypercapnia, secondary to COPD exacerbation. Much improved. 2. Acute COPD exacerbation, acute bronchitis, possible pneumonia community-acquired. Improving. Recent CT angiogram of the chest negative for PE. Atypical infection was suggested. Patient never took antibiotics. 3. Chronic chest wall pain. EKG unremarkable.no evidence of ACS.troponin negative. No further evaluation suggested. 4. Tobacco dependence.   Overall much improved today, currently tolerating being off BiPAP. Plan to continue steroids, antibiotics, breathing treatments.  Possible transfer to floor later today  Code Status: full code DVT prophylaxis: Lovenox Family Communication: none present Disposition Plan: home  Brendia Sacksaniel Arieal Cuoco, MD  [facility]  Pager [phone] If 7PM-7AM, please contact night-coverage at [email or web address], password [email or web address] 05/14/2014, 7:53 AM  LOS: 1 day   Consultants:  pulmonology  Procedures:    Antibiotics:  Levaquin 11/11 >>  HPI/Subjective: Appreciate pulmonology evaluation.   Feels much better today. Now off BiPAP. Breathing improved. Chest wall pain control. Hungry.  Objective: Filed Vitals:   05/14/14 0500 05/14/14 0600 05/14/14 0700 05/14/14 0747  BP: 110/58  104/66   Pulse: 49 50 51   Temp:    97.9 F (36.6 C)  TempSrc:    Axillary  Resp: 12 15 12    Height:      Weight: 81.9 kg (180 lb 8.9 oz)     SpO2: 99% 100% 100%     Intake/Output Summary (Last 24 hours) at 05/14/14 0753 Last data filed at 05/14/14 0600  Gross per 24  hour  Intake    383 ml  Output   1100 ml  Net   -717 ml     Filed Weights   05/13/14 1420 05/13/14 1640 05/14/14 0500  Weight: 92.08 kg (203 lb) 79.3 kg (174 lb 13.2 oz) 81.9 kg (180 lb 8.9 oz)    Exam:     Afebrile, vital signs stable.  General: appears much better. Calm, comfortable.  Psych: alert. Speech fluent and clear.  CV: regular rate and rhythm. No murmur, rub or gallop.  Respiratory: clear to auscultation bilaterally. No wheezes, rales or rhonchi. Normal respiratory effort.  Data Reviewed:  Urine output 1100  Troponin negative. Basic metabolic panel unremarkable. CBC stable with modest leukocytosis secondary to steroids.  Scheduled Meds: . antiseptic oral rinse  7 mL Mouth Rinse q12n4p  . chlorhexidine  15 mL Mouth Rinse BID  . enoxaparin (LOVENOX) injection  40 mg Subcutaneous Q24H  . ipratropium-albuterol  3 mL Nebulization Q4H  . levofloxacin (LEVAQUIN) IV  750 mg Intravenous Q24H  . methylPREDNISolone (SOLU-MEDROL) injection  40 mg Intravenous Q6H  . nicotine  21 mg Transdermal Daily  . sodium chloride  3 mL Intravenous Q12H   Continuous Infusions:   Principal Problem:   COPD exacerbation Active Problems:   Acute respiratory failure   Tobacco dependence   Time spent 20 minutes

## 2014-05-15 ENCOUNTER — Inpatient Hospital Stay (HOSPITAL_COMMUNITY): Payer: Medicaid Other

## 2014-05-15 DIAGNOSIS — R0782 Intercostal pain: Secondary | ICD-10-CM

## 2014-05-15 DIAGNOSIS — R079 Chest pain, unspecified: Secondary | ICD-10-CM | POA: Insufficient documentation

## 2014-05-15 LAB — SEDIMENTATION RATE: Sed Rate: 9 mm/hr (ref 0–16)

## 2014-05-15 MED ORDER — LORAZEPAM 0.5 MG PO TABS
0.5000 mg | ORAL_TABLET | Freq: Four times a day (QID) | ORAL | Status: DC | PRN
  Administered 2014-05-15 – 2014-05-16 (×3): 0.5 mg via ORAL
  Filled 2014-05-15 (×3): qty 1

## 2014-05-15 MED ORDER — IOHEXOL 350 MG/ML SOLN
100.0000 mL | Freq: Once | INTRAVENOUS | Status: AC | PRN
  Administered 2014-05-15: 100 mL via INTRAVENOUS

## 2014-05-15 NOTE — Progress Notes (Signed)
PROGRESS NOTE  Maxx Pham GYK:[id number] DOB: 1961-04-05 DOA: 05/13/2014 PCP: No PCP Per Patient  Summary: 53 year old man presented to ED with 2+ week history of shortness of breath and constant chest pain. Placed on BiPAP in the emergency department for respiratory distress in admitted for further evaluation of acute respiratory failure secondary to COPD exacerbation.  Assessment/Plan: 1. Acute respiratory failure without hypoxia or hypercapnia, resolved. Secondary to COPD exacerbation, community-acquired pneumonia versus viral pneumonia. No hypoxia. 2. Acute COPD exacerbation, acute bronchitis. Rapidly improving. Appears to be near baseline. 3. Community acquired bacterial pneumonia versus viral pneumonia.chest CT findings noted, discussed with Dr. Halford Chessman. Given the patient's clinical improvement he does not recommend bronchoscopy, he does recommend further laboratory studies which will be ordered and follow-up CT scan in a few weeks to document resolution. 4. Chronic chest wall pain. No abnormality seen on chest CT. No evidence of ACS. Likely secondary to acute illness and coughing. 5. Tobacco dependence. Recommend cessation.   Continues to improve. Plan transfer to floor. Continue steroids, antibiotics, breathing treatments.  Check HIV, ANA, RF, ESR, respiratory panel, C-ANCA. P-ANCA  Anticipate discharge next 48 hours.  Code Status: full code DVT prophylaxis: Lovenox Family Communication: none present Disposition Plan: home  Murray Hodgkins, MD  [facility]  Pager [phone] If 7PM-7AM, please contact night-coverage at [email or web address], password [facility] 05/15/2014, 3:46 PM  LOS: 2 days   Consultants:  pulmonology  Procedures:    Antibiotics:  Levaquin 11/11 >>  HPI/Subjective: No issues overnight. Pulmonology order CT chest for further evaluation of his chest pain.  Overall he is feeling a lot better. Breathing is much better. His only complaint other than  chronic back pain is ongoing chest wall pain and pain with breathing. He is hoping to go home soon.  Objective: Filed Vitals:   05/15/14 1148 05/15/14 1200 05/15/14 1504 05/15/14 1507  BP:   132/73   Pulse:  68 70   Temp: 96.9 F (36.1 C)  98.4 F (36.9 C)   TempSrc: Oral  Oral   Resp:   18   Height:      Weight:      SpO2:  96% 96% 97%    Intake/Output Summary (Last 24 hours) at 05/15/14 1546 Last data filed at 05/15/14 1245  Gross per 24 hour  Intake   1590 ml  Output   5875 ml  Net  -4285 ml     Filed Weights   05/13/14 1640 05/14/14 0500 05/15/14 0500  Weight: 79.3 kg (174 lb 13.2 oz) 81.9 kg (180 lb 8.9 oz) 82 kg (180 lb 12.4 oz)    Exam:     Afebrile, vital signs stable. No hypoxia. 97% on room air.  Gen. Appears calm, comfortable. Overall appears much better.  Psychiatric. Grossly normal mood and affect. Speech fluent and appropriate.  Cardiovascular regular rate and rhythm. No murmur, rub or gallop. Telemetry sinus rhythm.  Respiratory clear to auscultation bilaterally. Good air movement. No frank wheezes, rales or rhonchi. Mild increased respiratory effort. Able to speak in full sentences.  Skin: multiple tattoos.  Data Reviewed:  Urine output 4575  Blood cultures no growth to date  Scheduled Meds: . antiseptic oral rinse  7 mL Mouth Rinse q12n4p  . enoxaparin (LOVENOX) injection  40 mg Subcutaneous Q24H  . guaiFENesin  1,200 mg Oral BID  . ipratropium-albuterol  3 mL Nebulization Q4H  . levofloxacin (LEVAQUIN) IV  750 mg Intravenous Q24H  . methylPREDNISolone (SOLU-MEDROL) injection  40 mg Intravenous Q6H  .  nicotine  21 mg Transdermal Daily  . sodium chloride  3 mL Intravenous Q12H   Continuous Infusions:   Principal Problem:   COPD exacerbation Active Problems:   Acute respiratory failure   Tobacco dependence   Time spent 35 minutes, greater than 50% in counseling and coordination of care

## 2014-05-15 NOTE — Progress Notes (Signed)
Patient transferred to room 320. Report given to [employer] RN. Vital signs stable at transfer. Patient is a low fall risk.

## 2014-05-16 DIAGNOSIS — J189 Pneumonia, unspecified organism: Secondary | ICD-10-CM

## 2014-05-16 LAB — CULTURE, BLOOD (ROUTINE X 2)
CULTURE: NO GROWTH
Culture: NO GROWTH

## 2014-05-16 LAB — RHEUMATOID FACTOR: RHEUMATOID FACTOR: 13 [IU]/mL (ref <=14)

## 2014-05-16 LAB — HIV ANTIBODY (ROUTINE TESTING W REFLEX): HIV 1&2 Ab, 4th Generation: NONREACTIVE

## 2014-05-16 MED ORDER — PREDNISONE 10 MG PO TABS: ORAL_TABLET | ORAL | Status: DC

## 2014-05-16 MED ORDER — LEVOFLOXACIN 750 MG PO TABS
750.0000 mg | ORAL_TABLET | Freq: Every day | ORAL | Status: DC
  Administered 2014-05-16: 750 mg via ORAL
  Filled 2014-05-16: qty 1

## 2014-05-16 MED ORDER — IPRATROPIUM-ALBUTEROL 20-100 MCG/ACT IN AERS: 1.0000 | INHALATION_SPRAY | Freq: Four times a day (QID) | RESPIRATORY_TRACT | Status: DC

## 2014-05-16 MED ORDER — ALBUTEROL SULFATE HFA 108 (90 BASE) MCG/ACT IN AERS: 2.0000 | INHALATION_SPRAY | RESPIRATORY_TRACT | Status: DC | PRN

## 2014-05-16 MED ORDER — OXYCODONE-ACETAMINOPHEN 5-325 MG PO TABS: 1.0000 | ORAL_TABLET | Freq: Four times a day (QID) | ORAL | Status: DC | PRN

## 2014-05-16 MED ORDER — LEVOFLOXACIN 750 MG PO TABS: 750.0000 mg | ORAL_TABLET | Freq: Every day | ORAL | Status: DC

## 2014-05-16 MED ORDER — LEVOFLOXACIN 500 MG PO TABS: 500.0000 mg | ORAL_TABLET | Freq: Every day | ORAL | Status: DC

## 2014-05-16 MED ORDER — ALBUTEROL SULFATE HFA 108 (90 BASE) MCG/ACT IN AERS
2.0000 | INHALATION_SPRAY | RESPIRATORY_TRACT | Status: DC | PRN
  Filled 2014-05-16: qty 6.7

## 2014-05-16 MED ORDER — ALBUTEROL SULFATE (2.5 MG/3ML) 0.083% IN NEBU: 2.5000 mg | INHALATION_SOLUTION | RESPIRATORY_TRACT | Status: DC | PRN

## 2014-05-16 MED ORDER — IPRATROPIUM-ALBUTEROL 20-100 MCG/ACT IN AERS
1.0000 | INHALATION_SPRAY | Freq: Four times a day (QID) | RESPIRATORY_TRACT | Status: DC
  Filled 2014-05-16: qty 4

## 2014-05-16 NOTE — Plan of Care (Signed)
Problem: ICU Phase Progression Outcomes Goal: O2 sats trending toward baseline Outcome: Completed/Met Date Met:  05/16/14 Goal: Dyspnea controlled at rest Outcome: Completed/Met Date Met:  05/16/14 Goal: Hemodynamically stable Outcome: Completed/Met Date Met:  05/16/14 Goal: Pain controlled with appropriate interventions Outcome: Progressing

## 2014-05-16 NOTE — Progress Notes (Signed)
Called pt and notified him we had his inhalers at the desk.  Pt stated he would pick them up tomorrow.

## 2014-05-16 NOTE — Progress Notes (Signed)
Discharge teaching done with care notes.  All questions answered.  IV removed w/o difficulty and intact.  Pt stable at time of discharge.  Pt asked to walk to main entrance where wife will pick him up.

## 2014-05-16 NOTE — Progress Notes (Signed)
  PROGRESS NOTE  [name] Carter [id number]:[id number] DOB: Oct 16, 1960 DOA: 05/13/2014 PCP: No PCP Per Patient  Summary: 53 year old man presented to ED with 2+ week history of shortness of breath and constant chest pain. Placed on BiPAP in the emergency department for respiratory distress in admitted for further evaluation of acute respiratory failure secondary to COPD exacerbation.  Assessment/Plan: 1. Acute respiratory failure without hypoxia or hypercapnia, resolved. No hypoxia. 2. Acute COPD exacerbation. Appears resolved. 3. Community acquired bacterial pneumonia versus viral pneumonia. Chest CT findings discussed with Dr. Halford Chessman. Given the patient's clinical improvement recommended further laboratory studies which thus far have been unremarkable and follow-up CT scan in a few weeks to document resolution. No bronchoscopy indicated at this time. 4. Chronic chest wall pain. No abnormality seen on chest CT. No evidence of ACS. Likely secondary to acute illness and coughing. 5. Tobacco dependence. Recommend cessation. Patient reports he is committed to quit.   Overall much improved. Plan discharge home with oral antibiotics [facility] letter), albuterol inhaler, Combivent inhaler, steroid taper.  Respiratory panel, ANA, ANCA screen pending  We will arrange for outpatient follow-up with [facility] pulmonology. Recommend CT scan of 4 weeks document resolution.  Murray Hodgkins, MD  [facility]  Pager [phone] If 7PM-7AM, please contact night-coverage at [email or web address], password [facility] 05/16/2014, 1:25 PM  LOS: 3 days   Consultants:  pulmonology  Procedures:    Antibiotics:  Levaquin 11/11 >> 11/20  HPI/Subjective: No issues overnight.  Overall feeling much better. Breathing better. Pain well-controlled with oral medication. Wants to go home.  Objective: Filed Vitals:   05/16/14 0435 05/16/14 0538 05/16/14 0726 05/16/14 1100  BP:  131/85    Pulse:  76    Temp:  97.8 F (36.6 C)     TempSrc:  Oral    Resp:  18    Height:      Weight:      SpO2: 94% 94% 93% 94%    Intake/Output Summary (Last 24 hours) at 05/16/14 1325 Last data filed at 05/15/14 2359  Gross per 24 hour  Intake    240 ml  Output   1800 ml  Net  -1560 ml     Filed Weights   05/13/14 1640 05/14/14 0500 05/15/14 0500  Weight: 79.3 kg (174 lb 13.2 oz) 81.9 kg (180 lb 8.9 oz) 82 kg (180 lb 12.4 oz)    Exam:     Afebrile, vital signs stable. No hypoxia.  Gen. Appears calm, comfortable. Alert, speech fluent and clear.  Cardiovascular regular rate and rhythm. No murmur, rub or gallop.  Respiratory clear to auscultation bilaterally. No wheezes, rales or rhonchi. Able to speak in full sentences. Mild increased respiratory effort.  Data Reviewed:  HIV nonreactive. ESR and rheumatoid factor unremarkable.  Scheduled Meds: . antiseptic oral rinse  7 mL Mouth Rinse q12n4p  . enoxaparin (LOVENOX) injection  40 mg Subcutaneous Q24H  . guaiFENesin  1,200 mg Oral BID  . ipratropium-albuterol  3 mL Nebulization Q4H  . levofloxacin  750 mg Oral Daily  . methylPREDNISolone (SOLU-MEDROL) injection  40 mg Intravenous Q6H  . nicotine  21 mg Transdermal Daily  . sodium chloride  3 mL Intravenous Q12H   Continuous Infusions:   Principal Problem:   COPD exacerbation Active Problems:   Acute respiratory failure   Tobacco dependence   Chest pain

## 2014-05-16 NOTE — Progress Notes (Signed)
PHARMACIST - PHYSICIAN COMMUNICATION DR:   Irene LimboGoodrich CONCERNING: Antibiotic IV to Oral Route Change Policy  RECOMMENDATION: This patient is receiving Levaquin by the intravenous route.  Based on criteria approved by the Pharmacy and Therapeutics Committee, the antibiotic(s) is/are being converted to the equivalent oral dose form(s).   DESCRIPTION: These criteria include:  Patient being treated for a respiratory tract infection, urinary tract infection, cellulitis or clostridium difficile associated diarrhea if on metronidazole  The patient is not neutropenic and does not exhibit a GI malabsorption state  The patient is eating (either orally or via tube) and/or has been taking other orally administered medications for a least 24 hours  The patient is improving clinically and has a Tmax < 100.5  If you have questions about this conversion, please contact the Pharmacy Department  [x]   [phone]( [phone] )  [facility] []   [phone]( [phone] )  [facility]  []   [phone]( [phone] )  [facility] []   [phone]( [phone] )  [facility]   Junita PushMichelle Rochella Benner, PharmD, BCPS 05/16/2014@11 :21 AM

## 2014-05-16 NOTE — Discharge Summary (Signed)
Physician Discharge Summary  Sherlynn StallsLeo Bachmeier QIH:[id number] DOB: 1960-09-03 DOA: 05/13/2014  PCP: No PCP Per Patient  Admit date: 05/13/2014 Discharge date: 05/16/2014  Recommendations for Outpatient Follow-up:  1. Community acquired pneumonia versus viral pneumonia. See CT findings and discussion below. Outpatient follow-up with pulmonology will be arranged. Of note the patient reports in the past he has been diagnosed twice with pneumonia and that at one point open lung biopsy was considered. Consideration could be given to outpatient bronchoscopy if symptoms recur. 2. Respiratory panel, ANA, ANCA screen pending. HIV was negative. 3. Consider repeat CT scan of the chest in 4 weeks to document resolution. 4. Tobacco dependence. Smoking cessation recommended. 5. Mild atherosclerotic calcifications along the course of the coronary arteries. Assessment for potential risk factor modification, dietary therapy or pharmacologic therapy may be warranted, if clinically indicated.   Follow-up Information    [facility]   Why:  2pm   Contact information:   [address] [place] [id number] [phone]      Discharge Diagnoses:  1. Acute respiratory failure without hypoxia or hypercapnia 2. Acute COPD exacerbation 3. Community acquired pneumonia 4. Tobacco dependence 5. Chest wall pain  Discharge Condition: improved Disposition: home  Diet recommendation: regular  Filed Weights   05/13/14 1640 05/14/14 0500 05/15/14 0500  Weight: 79.3 kg (174 lb 13.2 oz) 81.9 kg (180 lb 8.9 oz) 82 kg (180 lb 12.4 oz)    History of present illness:  53 year old man presented to ED with 2+ week history of shortness of breath and constant chest pain. Placed on BiPAP in the emergency department for respiratory distress in admitted for further evaluation of acute respiratory failure secondary to COPD exacerbation.  Hospital Course:  Mr. Freida BusmanCadorette was  admitted to stepdown unit on BiPAP, his condition quickly improvedand acute respiratory failure quickly resolved. COPD exacerbation was slower to improve but at this point has resolved. Clinically pneumonia has improved as well, he is afebrile and has no hypoxia. CT findings grossly abnormal, further discussion below. At this point the patient is doing well without need for oxygen is stable for discharge. Outpatient follow-up will be arranged with pulmonology.  1. Acute respiratory failure without hypoxia or hypercapnia, resolved. No hypoxia. 2. Acute COPD exacerbation. Appears resolved. 3. Community acquired bacterial pneumonia versus viral pneumonia. Chest CT findings discussed with Dr. Craige CottaSood. Given the patient's clinical improvement he recommended further laboratory studies which thus far have been unremarkable and follow-up CT scan in a few weeks to document resolution. No bronchoscopy indicated at this time. 4. Chronic chest wall pain. No abnormality seen on chest CT. No evidence of ACS. Likely secondary to acute illness and coughing. 5. Tobacco dependence. Recommend cessation. Patient reports he is committed to quit.  Consultants:  pulmonology  Procedures:    Antibiotics:  Levaquin 11/11 >> 11/20  Discharge Instructions  Discharge Instructions    Activity as tolerated - No restrictions    Complete by:  As directed      Diet general    Complete by:  As directed      Discharge instructions    Complete by:  As directed   Call your physician or seek immediate medical attention for increased shortness of breath, fever or worsening of condition. Please stop smoking! You will be contacted in regard to follow-up with a lung doctor.          Current Discharge Medication List    START taking these medications  Details  Ipratropium-Albuterol (COMBIVENT) 20-100 MCG/ACT AERS respimat Inhale 1 puff into the lungs 4 (four) times daily. Qty: 1 Inhaler, Refills: 0      CONTINUE these  medications which have CHANGED   Details  albuterol (PROVENTIL HFA;VENTOLIN HFA) 108 (90 BASE) MCG/ACT inhaler Inhale 2 puffs into the lungs every 4 (four) hours as needed for wheezing or shortness of breath (cough). Qty: 1 Inhaler, Refills: 0    albuterol (PROVENTIL) (2.5 MG/3ML) 0.083% nebulizer solution Take 3 mLs (2.5 mg total) by nebulization every 4 (four) hours as needed for wheezing or shortness of breath.    levofloxacin (LEVAQUIN) 750 MG tablet Take 1 tablet (750 mg total) by mouth daily. Qty: 6 tablet, Refills: 0    oxyCODONE-acetaminophen (PERCOCET/ROXICET) 5-325 MG per tablet Take 1-2 tablets by mouth every 6 (six) hours as needed for severe pain. Qty: 30 tablet, Refills: 0    predniSONE (DELTASONE) 10 MG tablet Start 11/15 . Take 40 mg by mouth daily for 4 days, then take 20 mg by mouth daily for 4 days, then take 10 mg by mouth daily for 4 days, then stop. Qty: 28 tablet, Refills: 0      CONTINUE these medications which have NOT CHANGED   Details  Spacer/Aero-Holding Chambers (AEROCHAMBER PLUS WITH MASK) inhaler Use as instructed Qty: 1 each, Refills: 2      STOP taking these medications     acetaminophen (TYLENOL) 500 MG tablet      Fluticasone-Salmeterol (ADVAIR DISKUS) 250-50 MCG/DOSE AEPB        Allergies  Allergen Reactions  . Vicodin [Hydrocodone-Acetaminophen] Nausea And Vomiting and Other (See Comments)    Made patient feel funny, stomach pain    The results of significant diagnostics from this hospitalization (including imaging, microbiology, ancillary and laboratory) are listed below for reference.    Significant Diagnostic Studies: Ct Angio Chest Pe W/cm &/or Wo Cm  05/15/2014   CLINICAL DATA:  53 year old male with 1 month history of progressive chest pain and cough. Known medical history of COPD and past history of pneumonia.  EXAM: CT ANGIOGRAPHY CHEST WITH CONTRAST  TECHNIQUE: Multidetector CT imaging of the chest was performed using the  standard protocol during bolus administration of intravenous contrast. Multiplanar CT image reconstructions and MIPs were obtained to evaluate the vascular anatomy.  CONTRAST:  100mL OMNIPAQUE IOHEXOL 350 MG/ML SOLN  COMPARISON:  Prior chest x-ray 05/13/2014; prior chest CT 05/09/2014  FINDINGS: Mediastinum: Unremarkable CT appearance of the thyroid gland. No suspicious mediastinal or hilar adenopathy. Mildly prominent right hilar nodal tissue measuring 11 mm in short axis which is in significantly changed compared to the recent prior imaging. No soft tissue mediastinal mass. The thoracic esophagus is unremarkable.  Heart/Vascular: Adequate opacification of the pulmonary arteries to the proximal subsegmental level. No central filling defect to suggest acute pulmonary embolus. Conventional 3 vessel aortic arch anatomy. No aneurysmal dilatation or evidence of dissection. Mild cardiomegaly. No pericardial effusion. Trace atherosclerotic calcifications present along the course of the coronary arteries.  Lungs/Pleura: No evidence of a pleural effusion. Interval progression of the bilateral patchy regions of ground-glass attenuation opacity in the bilateral upper lungs. There is relative subpleural sparing. Additionally, the lower lobes are relatively spared. There is mild dependent atelectasis in the lower lobes bilaterally as well as more faint patchy foci of ground-glass attenuation. Mild diffuse bronchial wall thickening. No evidence of pneumothorax.  Bones/Soft Tissues: No acute fracture or aggressive appearing lytic or blastic osseous lesion.  Upper Abdomen: Visualized upper  abdominal organs are unremarkable.  Review of the MIP images confirms the above findings.  IMPRESSION: 1. Negative for acute pulmonary embolus. 2. Interval progression of bilateral patchy ground-glass attenuation opacities in an upper lung predominant distribution with relative subpleural sparing. The imaging findings are nonspecific.  Differential considerations include multifocal atypical infection (including viral an opportunistic infections), acute interstitial pneumonitis, ARDS, and less likely inhalational injury given upper lung predominance, noncardiogenic edema or pulmonary alveolar hemorrhage. Recommend pulmonology consultation and bronchoscopy. 3. Mild atherosclerotic calcifications along the course of the coronary arteries. Please note that although the presence of coronary artery calcium documents the presence of coronary artery disease, the severity of this disease and any potential stenosis cannot be assessed on this non-gated CT examination. Assessment for potential risk factor modification, dietary therapy or pharmacologic therapy may be warranted, if clinically indicated.  4. Mildly prominent right hilar nodal tissue favored to be reactive to the underlying pulmonary process.   Electronically Signed   By: Malachy Moan M.D.   On: 05/15/2014 10:42   Dg Chest Port 1 View  05/13/2014   CLINICAL DATA:  Severe shortness of Breath  EXAM: PORTABLE CHEST - 1 VIEW  COMPARISON:  05/09/2014  FINDINGS: Cardiac shadow is stable. The lungs are well aerated bilaterally. Mild interstitial changes are noted. No focal confluent infiltrate is seen. The bony structures are within normal limits.  IMPRESSION: No acute abnormality noted.   Electronically Signed   By: Alcide Clever M.D.   On: 05/13/2014 14:11   Microbiology: Recent Results (from the past 240 hour(s))  Culture, blood (routine x 2)     Status: None   Collection Time: 05/09/14  3:09 PM  Result Value Ref Range Status   Specimen Description BLOOD LEFT ANTECUBITAL  Final   Special Requests BOTTLES DRAWN AEROBIC AND ANAEROBIC 10CC EACH  Final   Culture  Setup Time   Final    05/10/2014 00:57 Performed at [facility]    Culture   Final    NO GROWTH 5 DAYS Performed at [facility]    Report Status 05/16/2014 FINAL  Final  Culture, blood (routine x 2)      Status: None   Collection Time: 05/09/14  3:15 PM  Result Value Ref Range Status   Specimen Description BLOOD RIGHT ANTECUBITAL  Final   Special Requests BOTTLES DRAWN AEROBIC AND ANAEROBIC 5CC EACH  Final   Culture  Setup Time   Final    05/10/2014 00:57 Performed at [facility]    Culture   Final    NO GROWTH 5 DAYS Performed at [facility]    Report Status 05/16/2014 FINAL  Final  MRSA PCR Screening     Status: None   Collection Time: 05/13/14  5:46 PM  Result Value Ref Range Status   MRSA by PCR NEGATIVE NEGATIVE Final    Comment:        The GeneXpert MRSA Assay (FDA approved for NASAL specimens only), is one component of a comprehensive MRSA colonization surveillance program. It is not intended to diagnose MRSA infection nor to guide or monitor treatment for MRSA infections.      Labs: Basic Metabolic Panel:  Recent Labs Lab 05/09/14 1515 05/13/14 1352 05/14/14 0547  NA 139 142 141  K 3.9 3.5* 4.7  CL 101 102 103  CO2 22 25 25   GLUCOSE 106* 113* 144*  BUN 12 11 13   CREATININE 0.78 0.86 0.75  CALCIUM 9.1 9.6 8.9  Liver Function Tests:  Recent Labs Lab 05/09/14 1515  AST 21  ALT 27  ALKPHOS 85  BILITOT 0.7  PROT 6.7  ALBUMIN 3.3*   CBC:  Recent Labs Lab 05/09/14 1515 05/13/14 1352 05/14/14 0547  WBC 13.7* 15.3* 13.0*  HGB 13.7 15.3 13.3  HCT 41.7 44.8 40.4  MCV 91.9 91.6 93.1  PLT 203 272 253   Cardiac Enzymes:  Recent Labs Lab 05/13/14 1942 05/14/14 0752  TROPONINI <0.30 <0.30     Recent Labs  04/24/14 1144 05/13/14 1352  PROBNP 45.3 120.7    Principal Problem:   COPD exacerbation Active Problems:   Acute respiratory failure   Tobacco dependence   Chest pain   CAP (community acquired pneumonia)   Time coordinating discharge: 35 minutes  Signed:  Brendia Sacks, MD [facility] 05/16/2014, 2:32 PM

## 2014-05-18 LAB — RESPIRATORY VIRUS PANEL
ADENOVIRUS: NOT DETECTED
INFLUENZA B 1: NOT DETECTED
Influenza A H1: NOT DETECTED
Influenza A H3: NOT DETECTED
Influenza A: NOT DETECTED
Metapneumovirus: NOT DETECTED
PARAINFLUENZA 3 A: NOT DETECTED
Parainfluenza 1: NOT DETECTED
Parainfluenza 2: NOT DETECTED
Respiratory Syncytial Virus A: NOT DETECTED
Respiratory Syncytial Virus B: NOT DETECTED
Rhinovirus: NOT DETECTED

## 2014-05-18 LAB — ANCA SCREEN W REFLEX TITER
ATYPICAL P-ANCA SCREEN: NEGATIVE
P-ANCA SCREEN: NEGATIVE
c-ANCA Screen: NEGATIVE

## 2014-05-18 LAB — ANA: Anti Nuclear Antibody(ANA): NEGATIVE

## 2014-05-30 ENCOUNTER — Emergency Department (HOSPITAL_COMMUNITY): Payer: Medicaid Other

## 2014-05-30 ENCOUNTER — Emergency Department (HOSPITAL_COMMUNITY)
Admission: EM | Admit: 2014-05-30 | Discharge: 2014-05-30 | Disposition: A | Discharge status: Home / Self Care | Payer: Medicaid Other | Attending: Emergency Medicine | Admitting: Emergency Medicine

## 2014-05-30 ENCOUNTER — Encounter (HOSPITAL_COMMUNITY): Payer: Self-pay | Admitting: Emergency Medicine

## 2014-05-30 DIAGNOSIS — Z720 Tobacco use: Secondary | ICD-10-CM | POA: Insufficient documentation

## 2014-05-30 DIAGNOSIS — J449 Chronic obstructive pulmonary disease, unspecified: Secondary | ICD-10-CM | POA: Insufficient documentation

## 2014-05-30 DIAGNOSIS — Z8701 Personal history of pneumonia (recurrent): Secondary | ICD-10-CM | POA: Diagnosis not present

## 2014-05-30 DIAGNOSIS — Z79899 Other long term (current) drug therapy: Secondary | ICD-10-CM | POA: Diagnosis not present

## 2014-05-30 DIAGNOSIS — M79601 Pain in right arm: Secondary | ICD-10-CM | POA: Diagnosis not present

## 2014-05-30 DIAGNOSIS — M62838 Other muscle spasm: Secondary | ICD-10-CM

## 2014-05-30 DIAGNOSIS — M542 Cervicalgia: Secondary | ICD-10-CM | POA: Diagnosis not present

## 2014-05-30 DIAGNOSIS — G8929 Other chronic pain: Secondary | ICD-10-CM | POA: Insufficient documentation

## 2014-05-30 HISTORY — DX: Other cervical disc degeneration, unspecified cervical region: M50.30

## 2014-05-30 HISTORY — DX: Dorsalgia, unspecified: M54.9

## 2014-05-30 HISTORY — DX: Other intervertebral disc degeneration, lumbar region: M51.36

## 2014-05-30 HISTORY — DX: Other intervertebral disc degeneration, lumbar region without mention of lumbar back pain or lower extremity pain: M51.369

## 2014-05-30 HISTORY — DX: Other chronic pain: G89.29

## 2014-05-30 MED ORDER — DIAZEPAM 5 MG PO TABS
5.0000 mg | ORAL_TABLET | Freq: Once | ORAL | Status: AC
  Administered 2014-05-30: 5 mg via ORAL
  Filled 2014-05-30: qty 1

## 2014-05-30 MED ORDER — HYDROMORPHONE HCL 2 MG/ML IJ SOLN
2.0000 mg | Freq: Once | INTRAMUSCULAR | Status: AC
  Administered 2014-05-30: 2 mg via INTRAMUSCULAR
  Filled 2014-05-30: qty 1

## 2014-05-30 MED ORDER — OXYCODONE-ACETAMINOPHEN 5-325 MG PO TABS
ORAL_TABLET | ORAL | Status: DC
Start: 2014-05-30 — End: 2014-06-08

## 2014-05-30 MED ORDER — METHOCARBAMOL 500 MG PO TABS: 1000.0000 mg | ORAL_TABLET | Freq: Four times a day (QID) | ORAL | Status: DC | PRN

## 2014-05-30 NOTE — ED Notes (Signed)
Pain  Started in the neck and pain radiates to right arm.  Rates pain 8 in arm and neck.  Took tyenol, Advil, & motrin without relief.

## 2014-05-30 NOTE — ED Provider Notes (Signed)
CSN: [id number]     Arrival date & time 05/30/14  1235 History   First MD Initiated Contact with Patient 05/30/14 1306     Chief Complaint  Patient presents with  . Neck Pain  . Arm Pain     HPI Pt was seen at 1330. Per pt, c/o gradual onset and persistence of constant right sided neck "pain" for the past 1.5 weeks. Pain radiates into his right shoulder, worsens with palpation of the area and body position changes. Denies incont/retention of bowel or bladder, no saddle anesthesia, no focal motor weakness, no tingling/numbness in extremities, no fevers, no injury, no abd pain. The symptoms have been associated with no other complaints.     Past Medical History  Diagnosis Date  . COPD (chronic obstructive pulmonary disease)   . Pneumonia   . Chronic back pain   . DDD (degenerative disc disease), lumbar   . DDD (degenerative disc disease), cervical    Past Surgical History  Procedure Laterality Date  . Back surgery      low back x4  . Bilateral carpal tunnel release    . Ulnar nerve transposition      ? by description, not clear  . Bilteral knee surgery     Family History  Problem Relation Age of Onset  . Heart attack Father    History  Substance Use Topics  . Smoking status: Light Tobacco Smoker    Types: Cigarettes  . Smokeless tobacco: Not on file  . Alcohol Use: No    Review of Systems ROS: Statement: All systems negative except as marked or noted in the HPI; Constitutional: Negative for fever and chills. ; ; Eyes: Negative for eye pain, redness and discharge. ; ; ENMT: Negative for ear pain, hoarseness, nasal congestion, sinus pressure and sore throat. ; ; Cardiovascular: Negative for chest pain, palpitations, diaphoresis, dyspnea and peripheral edema. ; ; Respiratory: Negative for cough, wheezing and stridor. ; ; Gastrointestinal: Negative for nausea, vomiting, diarrhea, abdominal pain, blood in stool, hematemesis, jaundice and rectal bleeding. . ; ; Genitourinary:  Negative for dysuria, flank pain and hematuria. ; ; Musculoskeletal: +neck pain. Negative for back pain. Negative for swelling and trauma.; ; Skin: Negative for pruritus, rash, abrasions, blisters, bruising and skin lesion.; ; Neuro: Negative for headache, lightheadedness and neck stiffness. Negative for weakness, altered level of consciousness , altered mental status, extremity weakness, paresthesias, involuntary movement, seizure and syncope.     Allergies  Vicodin  Home Medications   Prior to Admission medications   Medication Sig Start Date End Date Taking? Authorizing Provider  Ipratropium-Albuterol (COMBIVENT) 20-100 MCG/ACT AERS respimat Inhale 1 puff into the lungs 4 (four) times daily. 05/16/14  Yes Standley Brookinganiel P Goodrich, MD  albuterol (PROVENTIL HFA;VENTOLIN HFA) 108 (90 BASE) MCG/ACT inhaler Inhale 2 puffs into the lungs every 4 (four) hours as needed for wheezing or shortness of breath (cough). 05/16/14   Standley Brookinganiel P Goodrich, MD  albuterol (PROVENTIL) (2.5 MG/3ML) 0.083% nebulizer solution Take 3 mLs (2.5 mg total) by nebulization every 4 (four) hours as needed for wheezing or shortness of breath. 05/16/14   Standley Brookinganiel P Goodrich, MD  levofloxacin (LEVAQUIN) 750 MG tablet Take 1 tablet (750 mg total) by mouth daily. Patient not taking: Reported on 05/30/2014 05/16/14   Standley Brookinganiel P Goodrich, MD  oxyCODONE-acetaminophen (PERCOCET/ROXICET) 5-325 MG per tablet Take 1-2 tablets by mouth every 6 (six) hours as needed for severe pain. Patient not taking: Reported on 05/30/2014 05/16/14   Standley Brookinganiel P Goodrich,  MD  predniSONE (DELTASONE) 10 MG tablet Start 11/15 . Take 40 mg by mouth daily for 4 days, then take 20 mg by mouth daily for 4 days, then take 10 mg by mouth daily for 4 days, then stop. Patient not taking: Reported on 05/30/2014 05/16/14   Standley Brooking, MD  Spacer/Aero-Holding Chambers (AEROCHAMBER PLUS WITH MASK) inhaler Use as instructed 05/05/14   Doug Sou, MD   BP 136/92 mmHg  Pulse  93  Temp(Src) 97.4 F (36.3 C) (Oral)  Resp 19  Ht 5\' 9"  (1.753 m)  Wt 186 lb (84.369 kg)  BMI 27.45 kg/m2  SpO2 100% Physical Exam  1335: Physical examination:  Nursing notes reviewed; Vital signs and O2 SAT reviewed;  Constitutional: Well developed, Well nourished, Well hydrated, In no acute distress; Head:  Normocephalic, atraumatic; Eyes: EOMI, PERRL, No scleral icterus; ENMT: Mouth and pharynx normal, Mucous membranes moist; Neck: Supple, Full range of motion, No lymphadenopathy; Cardiovascular: Regular rate and rhythm, No murmur, rub, or gallop; Respiratory: Breath sounds clear & equal bilaterally, No rales, rhonchi, wheezes.  Speaking full sentences with ease, Normal respiratory effort/excursion; Chest: Nontender, Movement normal; Abdomen: Soft, Nontender, Nondistended, Normal bowel sounds; Genitourinary: No CVA tenderness; Spine:  No midline CS, TS, LS tenderness. +TTP right hypertonic trapezius muscle. No rash.;; Extremities: Pulses normal,  Right shoulder w/FROM.  NT to palp entire joint, AC joint, clavicle NT, scapula NT, proximal humerus NT, biceps tendon NT over bicipital groove.  Motor strength at shoulder normal.  Sensation intact over deltoid region, distal NMS intact with hand on affected side having intact and equal sensation and strength in the distribution of the median, radial, and ulnar nerve function compared to opposite side.  Strong radial pulse.  +FROM right elbow with intact motor strength biceps and triceps muscles to resistance. No deformity. No edema, No calf edema or asymmetry.; Neuro: AA&Ox3, Major CN grossly intact.  Speech clear. No gross focal motor or sensory deficits in extremities. Grips equal. Strength 5/5 equal bilat UE's and LE's. DTR 2/4 equal bilat UE's and LE's. Climbs on and off stretcher easily by himself. Gait steady; Skin: Color normal, Warm, Dry.  .     ED Course  Procedures    MDM  MDM Reviewed: previous chart, nursing note and  vitals Interpretation: CT scan     Ct Cervical Spine Wo Contrast 05/30/2014   CLINICAL DATA:  Acute neck and right arm pain.  EXAM: CT CERVICAL SPINE WITHOUT CONTRAST  TECHNIQUE: Multidetector CT imaging of the cervical spine was performed without intravenous contrast. Multiplanar CT image reconstructions were also generated.  COMPARISON:  None.  FINDINGS: Reversal of normal lordosis is noted most likely positional in origin. No definite fracture or spondylolisthesis is noted. Severe degenerative disc disease is noted at C5-6 and C6-7 with anterior osteophyte formation. Mild bilateral neural foraminal stenosis is noted at C5-6 secondary to uncovertebral spurring. Mild neural foraminal stenosis is noted on the left at C6-7 secondary to uncovertebral spurring. Moderate to severe neural foraminal stenosis is noted on the right at C6-7 secondary to uncovertebral spurring. Mild bilateral neural foraminal stenosis is noted at C7-T1 secondary to uncovertebral spurring. Minimal hypertrophy of the posterior facet joints is seen at multiple levels.  IMPRESSION: Severe degenerative disc disease is noted at C5-6 and C6-7.  No fracture or significant spondylolisthesis is noted.  Bilateral neural foraminal stenosis is noted in the lower cervical spine due to uncovertebral spurring, most prominently seen on the right at C6-7.   Electronically  Signed   By: Roque LiasJames  Green M.D.   On: 05/30/2014 14:18    1500:  CT scan with DDD, no fx. Neuro exam intact. Will tx symptomatically at this time. Dx and testing d/w pt.  Questions answered.  Verb understanding, agreeable to d/c home with outpt f/u.    Samuel JesterKathleen Zayvian Mcmurtry, DO 06/03/14 1024

## 2014-05-30 NOTE — Discharge Instructions (Signed)
°Emergency Department Resource Guide °1) Find a Doctor and Pay Out of Pocket °Although you won't have to find out who is covered by your insurance plan, it is a good idea to ask around and get recommendations. You will then need to call the office and see if the doctor you have chosen will accept you as a new patient and what types of options they offer for patients who are self-pay. Some doctors offer discounts or will set up payment plans for their patients who do not have insurance, but you will need to ask so you aren't surprised when you get to your appointment. ° °2) Contact Your Local Health Department °Not all health departments have doctors that can see patients for sick visits, but many do, so it is worth a call to see if yours does. If you don't know where your local health department is, you can check in your phone book. The CDC also has a tool to help you locate your state's health department, and many state websites also have listings of all of their local health departments. ° °3) Find a Walk-in Clinic °If your illness is not likely to be very severe or complicated, you may want to try a walk in clinic. These are popping up all over the country in pharmacies, drugstores, and shopping centers. They're usually staffed by nurse practitioners or physician assistants that have been trained to treat common illnesses and complaints. They're usually fairly quick and inexpensive. However, if you have serious medical issues or chronic medical problems, these are probably not your best option. ° °No Primary Care Doctor: °- Call [facility] at  832-8000 - they can help you locate a primary care doctor that  accepts your insurance, provides certain services, etc. °- Physician Referral Service- 1-800-533-3463 ° °Chronic Pain Problems: °Organization         Address  Phone   Notes  °[facility]  (336) 297-2271 Patients need to be referred by their primary care doctor.  ° °Medication  Assistance: °Organization         Address  Phone   Notes  °[place] [facility] [address] °[place], [place] [address] (336) 641-8030 --Must be a resident of [place] °-- Must have NO insurance coverage whatsoever (no Medicaid/ Medicare, etc.) °-- The pt. MUST have a primary care doctor that directs their care regularly and follows them in the community °  °[facility]  (866) 331-1348   °[employer]  (888) 892-1162   ° °Agencies that provide inexpensive medical care: °Organization         Address  Phone   Notes  °[facility]  (336) 832-8035   °[facility]    (336) 832-7272   °[facility] [address] °[place], [place] [address] (336) 832-4777   °[facility] [address], °[place] (336) 271-4999   °[facility]    (336) 373-0678   °[facility]    (336) 272-1050   °[facility] ° [address], [place] Phone:  (336) 832-4444, Fax:  (336) 832-4440 Hours of Operation:  9 am - 6 pm, M-F.  Also accepts Medicaid/Medicare and self-pay.  °[facility] ° [address], [place] Phone: (336) 832-3150, Fax: (336) 832-3151. Hours of Operation:  8:30 am - 5:30 pm, M-F.  Also accepts Medicaid and self-pay.  °[facility] [place] [address], [place] Phone: (336) 878-6027   °[facility] [address], [place], [place] (336)723-1848, Ext. 123 Mondays & Thursdays: 7-9 AM.  First 15 patients are seen on a first come, first serve basis. °  ° °Medicaid-accepting [place] Providers: ° °Organization         Address  Phone   Notes  °[facility] [address], [place] (336) 641-2100 Also accepts self-pay patients.  °[facility] [address], [place] ° (336) 856-9996   °[facility] [address], [place]  (336) 288-8857   °[facility] [address] [place] [address], [place] (336) 299-7000   °Veita Bland [address], [place]  ° (336) 373-1557 Only accepts [place] Access Medicaid patients after they have their name applied to their card.  ° °Self-Pay (no insurance) in [place]: ° °Organization         Address  Phone   Notes  °Sickle Cell Patients, [facility] [address], [place] (336) 832-1970   °[facility] [address], [place] (336) 832-4400   °[facility] ° [address], [place] (336) 992-4800   °[facility]/Dr. Osei-Bonsu ° [address] [place] [address], [place] or [address], [place] (336) 841-8500 Phone number for both [place] and [place] locations is the same.  °[facility] [address], [place] (336) 299-0000   °[facility] [address] [place] [address], [place] or [address] (336) 852-7530 °(336) 878-2260   °[facility] [address], [place] (336) 350-1642, phone; (336) 294-5005, fax Sees patients 1st and 3rd Saturday of every month.  Must not qualify for public or private insurance (i.e. Medicaid, Medicare, [place] Health Choice, Veterans' Benefits) • Household income should be no more than 200% of the poverty level •The clinic cannot treat you if you are pregnant or think you are pregnant • Sexually transmitted diseases are not treated at the clinic.  ° ° °Dental Care: °Organization         Address  Phone  Notes  °[place] [facility] [address], [place] (336) 641-6152 Accepts children up to age 21 who are enrolled in Medicaid or [place] Health Choice; pregnant women with a Medicaid card; and children who have applied for Medicaid or [place] Health Choice, but were declined, whose parents can pay a reduced fee at time of service.  °[place]  [facility] [place]  [address], [place] (336) 641-7733 Accepts children up to age 21 who are enrolled in Medicaid or [place] Health Choice; pregnant women with a Medicaid card; and children who have applied for Medicaid or [place] Health Choice, but were declined, whose parents can pay a reduced fee at time of service.  °[facility] ° [address], [place] (336) 641-4533 Patients are seen by appointment only. Walk-ins are not accepted. [facility] will see patients 18 years of age and older. °Monday - Tuesday (8am-5pm) °Most Wednesdays (8:30-5pm) °$30 per visit, cash only  °[facility] ° [address], [place] (336) 641-4533 Patients are seen by appointment only. Walk-ins are not accepted. [facility] will see patients 18 years of age and older. °One   Wednesday Evening (Monthly: Volunteer Based).  $30 per visit, cash only  °[facility]  (919) 537-3737 for adults; Children under age 4, call Graduate Pediatric Dentistry at ([phone]) [phone]. Children aged 4-14, please call ([phone]) [phone] to request a pediatric application. ° Dental services are provided in all areas of dental care including fillings, crowns and bridges, complete and partial dentures, implants, gum treatment, root canals, and extractions. Preventive care is also provided. Treatment is provided to both adults and children. °Patients are selected via a lottery and there is often a waiting list. °  °[facility] [address], °[place] ° (336) 763-8833 [email or web address] °  °[facility] [address], [place], [place] (336)723-1848, Ext. 123 Second and Fourth Thursday of each month, opens at 6:30 AM; Clinic ends at 9 AM.  Patients are seen on a first-come first-served basis, and a limited number are seen during each clinic.  ° °[facility] ° [address], [place], [place] (336) 723-7904    Eligibility Requirements °You must have lived in [place], [place], or [place] counties for at least the last three months. °  You cannot be eligible for state or federal sponsored healthcare insurance, including Veterans Administration, Medicaid, or Medicare. °  You generally cannot be eligible for healthcare insurance through your employer.  °  How to apply: °Eligibility screenings are held every Tuesday and Wednesday afternoon from 1:00 pm until 4:00 pm. You do not need an appointment for the interview!  °[facility] [address], [place], [place] 336-631-2330   °[facility]  336-342-8273   °[facility]  336-703-3100   °[facility]  336-570-6415   ° °Behavioral Health Resources in the Community: °Intensive Outpatient Programs °Organization         Address  Phone  Notes  °[facility] [address], [place], [place] 336-878-6098   °[facility] [address], [place], [place] 336-832-9800   °[facility] [address], [place], [place] ° 336-882-2125   °[facility] [address],  °[place], [place] 1-800-853-5163 or 336-641-4981   °Substance Abuse Resources °Organization         Address  Phone  Notes  °[facility]  336-882-2125   °[facility]  336-784-9470   °[facility]  336-285-9073   °[facility]  336-845-3988   °Residential & Outpatient Substance Abuse Program  1-800-659-3381   °Psychological Services °Organization         Address  Phone  Notes  °[facility]  336- 832-9600   °[facility]  336- 378-7881   °[facility] [address], [place] 1-800-853-5163 or 336-641-4981   ° °Mobile Crisis Teams °Organization         Address  Phone  Notes  °[facility], Mobile Crisis Care Unit  1-877-626-1772   °Assertive °[facility] ° [address]  [place], [place] 336-834-9664   °Sharon DeEsch [address] °[place] 336-554-5454   ° °Self-Help/Support Groups °Organization         Address  Phone             Notes  °[facility]  - variety of support groups  336- 373-1402 Call for more information  °Narcotics Anonymous (NA), [facility] [address], °[place]  2 meetings at this location  ° °  Residential Treatment Programs Organization         Address  Phone  Notes  [facility] [address],    [place]  [phone]   [facility]  [address], [place] [id number], [place], [place] [phone]   [facility] [address], [place] [phone])[phone] Admissions: 8am-3pm M-F  [facility] [address]    [place], [place] [phone](780)[phone]   [facility] [address], [place], [place] [phone]   [facility] [address]  [place], [place] 500-938-1829(301)[phone]   [facility] Intensive Outpatient [address], [place], [place] [phone](902)[phone]   [facility] [address]  [place], [place] [phone] or [phone]   [facility] [address] [place], [place] [phone] Accepts Medicaid  [facility] [address]  [place] [phone] Substance Abuse/Addiction Treatment   [facility]         Address  Phone  Notes  [facility]  (501)433-8754(888) (681)[phone]   Angie FavaJulie Brannon, PhD [address], [address] [place], [place]   320-475-4126(336) ([phone] or [phone](336) [phone]   [facility]   [address] [place], [place] (325) [phone](336) ([phone]   [facility] [address], [place], [place] (405)[phone](336) (867) [phone] Insurance/Medicaid/sponsorship through [facility] [address]                                    [place], [place] ([phone]) [phone](336) ([phone]) [phone] Therapy/tele-psych/case    [facility] [address]   [place], [place] [phone](336) [phone]    Dr. Lolly MustacheArfeen  [phone](336) [phone]   [facility]  [employer] [facility] 1) [address], [place] 2) [address], [place] 3)  [address], [place] [phone](336) [phone](336) [phone]  [phone](336) [phone]   [facility] (579)335-6941(336) 317-708-9851 or (716)169-2751(336) 458-760-4451 (After Hours)      Take the prescriptions as directed.  Apply moist heat or ice to the area(s) of discomfort, for 15 minutes at a time, several times per day for the next few days.  Do not fall asleep on a heating or ice pack.  Call your regular medical doctor and the Neurosurgeon on Monday to schedule a follow up appointment this week.  Return to the Emergency Department immediately if worsening.

## 2014-06-08 ENCOUNTER — Emergency Department (HOSPITAL_COMMUNITY)
Admission: EM | Admit: 2014-06-08 | Discharge: 2014-06-08 | Disposition: A | Discharge status: Home / Self Care | Payer: Medicaid Other | Attending: Emergency Medicine | Admitting: Emergency Medicine

## 2014-06-08 ENCOUNTER — Encounter (HOSPITAL_COMMUNITY): Payer: Self-pay | Admitting: Cardiology

## 2014-06-08 DIAGNOSIS — M436 Torticollis: Secondary | ICD-10-CM | POA: Insufficient documentation

## 2014-06-08 DIAGNOSIS — J449 Chronic obstructive pulmonary disease, unspecified: Secondary | ICD-10-CM | POA: Diagnosis not present

## 2014-06-08 DIAGNOSIS — Z792 Long term (current) use of antibiotics: Secondary | ICD-10-CM | POA: Insufficient documentation

## 2014-06-08 DIAGNOSIS — Z720 Tobacco use: Secondary | ICD-10-CM | POA: Insufficient documentation

## 2014-06-08 DIAGNOSIS — M542 Cervicalgia: Secondary | ICD-10-CM | POA: Diagnosis present

## 2014-06-08 DIAGNOSIS — R200 Anesthesia of skin: Secondary | ICD-10-CM | POA: Insufficient documentation

## 2014-06-08 DIAGNOSIS — Z7952 Long term (current) use of systemic steroids: Secondary | ICD-10-CM | POA: Insufficient documentation

## 2014-06-08 DIAGNOSIS — Z9889 Other specified postprocedural states: Secondary | ICD-10-CM | POA: Insufficient documentation

## 2014-06-08 DIAGNOSIS — Z8701 Personal history of pneumonia (recurrent): Secondary | ICD-10-CM | POA: Insufficient documentation

## 2014-06-08 DIAGNOSIS — G8929 Other chronic pain: Secondary | ICD-10-CM | POA: Diagnosis not present

## 2014-06-08 DIAGNOSIS — Z79899 Other long term (current) drug therapy: Secondary | ICD-10-CM | POA: Insufficient documentation

## 2014-06-08 DIAGNOSIS — M5412 Radiculopathy, cervical region: Secondary | ICD-10-CM | POA: Diagnosis not present

## 2014-06-08 MED ORDER — HYDROMORPHONE HCL 1 MG/ML IJ SOLN
1.0000 mg | Freq: Once | INTRAMUSCULAR | Status: AC
  Administered 2014-06-08: 1 mg via INTRAMUSCULAR
  Filled 2014-06-08: qty 1

## 2014-06-08 MED ORDER — OXYCODONE-ACETAMINOPHEN 5-325 MG PO TABS: 1.0000 | ORAL_TABLET | ORAL | Status: DC | PRN

## 2014-06-08 MED ORDER — PREDNISONE 10 MG PO TABS: ORAL_TABLET | ORAL | Status: DC

## 2014-06-08 MED ORDER — DIAZEPAM 5 MG PO TABS: 5.0000 mg | ORAL_TABLET | Freq: Three times a day (TID) | ORAL | Status: DC | PRN

## 2014-06-08 MED ORDER — DIAZEPAM 5 MG PO TABS
5.0000 mg | ORAL_TABLET | Freq: Once | ORAL | Status: AC
  Administered 2014-06-08: 5 mg via ORAL
  Filled 2014-06-08: qty 1

## 2014-06-08 NOTE — Discharge Instructions (Signed)
Cervical Radiculopathy Cervical radiculopathy happens when a nerve in the neck is pinched or bruised by a slipped (herniated) disk or by arthritic changes in the bones of the cervical spine. This can occur due to an injury or as part of the normal aging process. Pressure on the cervical nerves can cause pain or numbness that runs from your neck all the way down into your arm and fingers. CAUSES  There are many possible causes, including:  Injury.  Muscle tightness in the neck from overuse.  Swollen, painful joints (arthritis).  Breakdown or degeneration in the bones and joints of the spine (spondylosis) due to aging.  Bone spurs that may develop near the cervical nerves. SYMPTOMS  Symptoms include pain, weakness, or numbness in the affected arm and hand. Pain can be severe or irritating. Symptoms may be worse when extending or turning the neck. DIAGNOSIS  Your caregiver will ask about your symptoms and do a physical exam. He or she may test your strength and reflexes. X-rays, CT scans, and MRI scans may be needed in cases of injury or if the symptoms do not go away after a period of time. Electromyography (EMG) or nerve conduction testing may be done to study how your nerves and muscles are working. TREATMENT  Your caregiver may recommend certain exercises to help relieve your symptoms. Cervical radiculopathy can, and often does, get better with time and treatment. If your problems continue, treatment options may include:  Wearing a soft collar for short periods of time.  Physical therapy to strengthen the neck muscles.  Medicines, such as nonsteroidal anti-inflammatory drugs (NSAIDs), oral corticosteroids, or spinal injections.  Surgery. Different types of surgery may be done depending on the cause of your problems. HOME CARE INSTRUCTIONS   Put ice on the affected area.  Put ice in a plastic bag.  Place a towel between your skin and the bag.  Leave the ice on for 15-20 minutes,  03-04 times a day or as directed by your caregiver.  If ice does not help, you can try using heat. Take a warm shower or bath, or use a hot water bottle as directed by your caregiver.  You may try a gentle neck and shoulder massage.  Use a flat pillow when you sleep.  Only take over-the-counter or prescription medicines for pain, discomfort, or fever as directed by your caregiver.  If physical therapy was prescribed, follow your caregiver's directions.  If a soft collar was prescribed, use it as directed. SEEK IMMEDIATE MEDICAL CARE IF:   Your pain gets much worse and cannot be controlled with medicines.  You have weakness or numbness in your hand, arm, face, or leg.  You have a high fever or a stiff, rigid neck.  You lose bowel or bladder control (incontinence).  You have trouble with walking, balance, or speaking. MAKE SURE YOU:   Understand these instructions.  Will watch your condition.  Will get help right away if you are not doing well or get worse. Document Released: 03/14/2001 Document Revised: 09/11/2011 Document Reviewed: 01/31/2011 [facility] Patient Information 2015 [place] [employer] [place]. This information is not intended to replace advice given to you by your health care provider. Make sure you discuss any questions you have with your health care provider.   Take your next dose of prednisone tomorrow evening.  Use the the other medicines as directed.  Do not drive within 4 hours of taking oxycodone as this will make you drowsy.  Avoid lifting,  Bending,  Twisting or  any other activity that worsens your pain over the next week.  Apply a heating pad to your neck and shoulder area 20 minutes several times daily.  You should get rechecked if your symptoms are not better over the next 5 days,  Or you develop increased pain,  Weakness in your leg(s) or loss of bladder or bowel function - these are symptoms of a worse injury.

## 2014-06-08 NOTE — ED Notes (Addendum)
Neck and right arm pain.  Fingers right hand numb.   Seen here with same one week ago. To f/u with doctor on 12-29.

## 2014-06-08 NOTE — ED Provider Notes (Signed)
CSN: [id number]     Arrival date & time 06/08/14  1557 History  This chart was scribed for non-physician practitioner Burgess Amor, PA-C working with Vida Roller, MD by Littie Deeds, ED Scribe. This patient was seen in room APFT20/APFT20 and the patient's care was started at 5:17 PM.      Chief Complaint  Patient presents with  . Torticollis  . Extremity Pain    The history is provided by the patient. No language interpreter was used.   HPI Comments: [name] Carter is a 53 y.o. 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 Patient also reports having shooting right arm pain radiating down his arm and occasional numbness in his finger tips with certain positions such as when he lies on his right side. He notes some relief to neck pain when raising up his right arm a certain way. Patient was seen here about one week ago for the same symptoms and has a follow-up appointment with a doctor on 12/29. The medications prescribed provided minimal and short-lived relief. He denies weakness or numbness in his lower extremities and has no urinary or bowel retention or incontinence.  His last oxycodone was 5 days ago. He has also tried Tylenol, Advil, Motrin and a cream for his neck pain without much relief; his last dose of medication was last night. Patient has allergies to Vicodin.   Past Medical History  Diagnosis Date  . COPD (chronic obstructive pulmonary disease)   . Pneumonia   . Chronic back pain   . DDD (degenerative disc disease), lumbar   . DDD (degenerative disc disease), cervical    Past Surgical History  Procedure Laterality Date  . Back surgery      low back x4  . Bilateral carpal tunnel release    . Ulnar nerve transposition      ? by description, not clear  . Bilteral knee surgery     Family History  Problem Relation Age of Onset  .  Heart attack Father    History  Substance Use Topics  . Smoking status: Light Tobacco Smoker    Types: Cigarettes  . Smokeless tobacco: Not on file  . Alcohol Use: No    Review of Systems  Constitutional: Negative for fever.  Respiratory: Negative for shortness of breath.   Cardiovascular: Negative for chest pain and leg swelling.  Gastrointestinal: Negative for abdominal pain, constipation and abdominal distention.  Genitourinary: Negative for dysuria, urgency, frequency, flank pain and difficulty urinating.  Musculoskeletal: Positive for myalgias and neck pain. Negative for joint swelling and gait problem.  Skin: Negative for rash.  Neurological: Positive for numbness. Negative for weakness.      Allergies  Vicodin  Home Medications   Prior to Admission medications   Medication Sig Start Date End Date Taking? Authorizing Provider  albuterol (PROVENTIL HFA;VENTOLIN HFA) 108 (90 BASE) MCG/ACT inhaler Inhale 2 puffs into the lungs every 4 (four) hours as needed for wheezing or shortness of breath (cough). 05/16/14   Standley Brooking, MD  albuterol (PROVENTIL) (2.5 MG/3ML) 0.083% nebulizer solution Take 3 mLs (2.5 mg total) by nebulization every 4 (four) hours as needed for wheezing or shortness of breath. 05/16/14   Standley Brooking, MD  diazepam (VALIUM) 5 MG tablet Take 1 tablet (5 mg total) by mouth every 8 (eight) hours as  needed for muscle spasms. 06/08/14   Burgess AmorJulie Emmerson Taddei, PA-C  Ipratropium-Albuterol (COMBIVENT) 20-100 MCG/ACT AERS respimat Inhale 1 puff into the lungs 4 (four) times daily. 05/16/14   Standley Brookinganiel P Goodrich, MD  levofloxacin (LEVAQUIN) 750 MG tablet Take 1 tablet (750 mg total) by mouth daily. Patient not taking: Reported on 05/30/2014 05/16/14   Standley Brookinganiel P Goodrich, MD  methocarbamol (ROBAXIN) 500 MG tablet Take 2 tablets (1,000 mg total) by mouth 4 (four) times daily as needed for muscle spasms (muscle spasm/pain). 05/30/14   Samuel JesterKathleen McManus, DO   oxyCODONE-acetaminophen (PERCOCET) 5-325 MG per tablet Take 1 tablet by mouth every 4 (four) hours as needed for severe pain. 06/08/14   Burgess AmorJulie Rhyse Loux, PA-C  predniSONE (DELTASONE) 10 MG tablet Start 11/15 . Take 40 mg by mouth daily for 4 days, then take 20 mg by mouth daily for 4 days, then take 10 mg by mouth daily for 4 days, then stop. Patient not taking: Reported on 05/30/2014 05/16/14   Standley Brookinganiel P Goodrich, MD  predniSONE (DELTASONE) 10 MG tablet 6, 5, 4, 3, 2 then 1 tablet by mouth daily for 6 days total. 06/08/14   Burgess AmorJulie Damichael Hofman, PA-C  Spacer/Aero-Holding Chambers (AEROCHAMBER PLUS WITH MASK) inhaler Use as instructed 05/05/14   Doug SouSam Jacubowitz, MD   BP 153/115 mmHg  Pulse 100  Temp(Src) 99.1 F (37.3 C) (Oral)  Resp 20  Ht 5\' 9"  (1.753 m)  Wt 178 lb (80.74 kg)  BMI 26.27 kg/m2  SpO2 98% Physical Exam  Constitutional: He appears well-developed and well-nourished.  HENT:  Head: Normocephalic.  Eyes: Conjunctivae are normal.  Neck: Normal range of motion. Neck supple.  Cardiovascular: Normal rate and intact distal pulses.   Pedal pulses normal.  Pulmonary/Chest: Effort normal.  Abdominal: Soft. Bowel sounds are normal. He exhibits no distension and no mass.  Musculoskeletal: Normal range of motion. He exhibits tenderness. He exhibits no edema.       Lumbar back: He exhibits tenderness. He exhibits no swelling, no edema and no spasm.  Tenderness at C5-C6 midline and along his right superior trapezius. Equal grip strength. 2+ bicep DTRs bilaterally. Normal sensation to fine touch in his arms and hands. Muscle spasm along right trapezius.  Neurological: He is alert. He has normal strength. He displays no atrophy and no tremor. No sensory deficit. Gait normal.  Reflex Scores:      Patellar reflexes are 2+ on the right side and 2+ on the left side.      Achilles reflexes are 2+ on the right side and 2+ on the left side. No strength deficit noted in hip and knee flexor and extensor muscle  groups.  Ankle flexion and extension intact.  Skin: Skin is warm and dry.  Psychiatric: He has a normal mood and affect.  Nursing note and vitals reviewed.   ED Course  Procedures  DIAGNOSTIC STUDIES: Oxygen Saturation is 98% on room air, normal by my interpretation.    COORDINATION OF CARE: 5:26 PM-Discussed treatment plan which includes medications and recommendation to try a soft collar with pt at bedside and pt agreed to plan.    Labs Review Labs Reviewed - No data to display  Imaging Review No results found.   EKG Interpretation None      MDM   Final diagnoses:  Torticollis, acute  Cervical radiculopathy   Ct c spine completed last week reviewed. Severe ddd at C5-7.  Normal neuro exam today, but with history of intermittent numbness in right fingertips.  He  was referred to neurosurgery, also prescribed oxycodone, prednisone taper and valium for muscle spasm.  Encouraged warm compresses.  Encouraged f/u with pcp (scheduled to establish care in about 3 weeks).  Discussed elevated bp, advised recheck of bp when pain free.    No neuro deficit on exam or by history to suggest emergent or surgical presentation.  Also discussed worsened sx that should prompt immediate re-evaluation including distal weakness, bowel/bladder retention/incontinence.    I personally performed the services described in this documentation, which was scribed in my presence. The recorded information has been reviewed and is accurate.   Burgess AmorJulie Salvatore Shear, PA-C 06/09/14 1316  Vida RollerBrian D Miller, MD 06/09/14 [phone]

## 2014-06-13 ENCOUNTER — Emergency Department (HOSPITAL_COMMUNITY): Payer: Medicaid Other

## 2014-06-13 ENCOUNTER — Encounter (HOSPITAL_COMMUNITY): Payer: Self-pay | Admitting: Emergency Medicine

## 2014-06-13 ENCOUNTER — Inpatient Hospital Stay (HOSPITAL_COMMUNITY)
Admission: EM | Admit: 2014-06-13 | Discharge: 2014-06-14 | DRG: 190 | Disposition: A | Discharge status: Home / Self Care | Payer: Medicaid Other | Attending: Family Medicine | Admitting: Family Medicine

## 2014-06-13 DIAGNOSIS — Z7952 Long term (current) use of systemic steroids: Secondary | ICD-10-CM

## 2014-06-13 DIAGNOSIS — J441 Chronic obstructive pulmonary disease with (acute) exacerbation: Secondary | ICD-10-CM | POA: Diagnosis not present

## 2014-06-13 DIAGNOSIS — G8929 Other chronic pain: Secondary | ICD-10-CM | POA: Diagnosis present

## 2014-06-13 DIAGNOSIS — Z8701 Personal history of pneumonia (recurrent): Secondary | ICD-10-CM | POA: Diagnosis not present

## 2014-06-13 DIAGNOSIS — M5136 Other intervertebral disc degeneration, lumbar region: Secondary | ICD-10-CM | POA: Diagnosis present

## 2014-06-13 DIAGNOSIS — J209 Acute bronchitis, unspecified: Secondary | ICD-10-CM | POA: Diagnosis present

## 2014-06-13 DIAGNOSIS — J9600 Acute respiratory failure, unspecified whether with hypoxia or hypercapnia: Secondary | ICD-10-CM | POA: Diagnosis present

## 2014-06-13 DIAGNOSIS — Z23 Encounter for immunization: Secondary | ICD-10-CM

## 2014-06-13 DIAGNOSIS — R0602 Shortness of breath: Secondary | ICD-10-CM

## 2014-06-13 DIAGNOSIS — M5030 Other cervical disc degeneration, unspecified cervical region: Secondary | ICD-10-CM | POA: Diagnosis present

## 2014-06-13 DIAGNOSIS — Z8249 Family history of ischemic heart disease and other diseases of the circulatory system: Secondary | ICD-10-CM

## 2014-06-13 DIAGNOSIS — J9602 Acute respiratory failure with hypercapnia: Secondary | ICD-10-CM | POA: Diagnosis present

## 2014-06-13 DIAGNOSIS — F17200 Nicotine dependence, unspecified, uncomplicated: Secondary | ICD-10-CM | POA: Diagnosis present

## 2014-06-13 DIAGNOSIS — F17210 Nicotine dependence, cigarettes, uncomplicated: Secondary | ICD-10-CM | POA: Diagnosis present

## 2014-06-13 DIAGNOSIS — J9601 Acute respiratory failure with hypoxia: Secondary | ICD-10-CM | POA: Insufficient documentation

## 2014-06-13 DIAGNOSIS — Z79891 Long term (current) use of opiate analgesic: Secondary | ICD-10-CM | POA: Diagnosis not present

## 2014-06-13 LAB — COMPREHENSIVE METABOLIC PANEL
ALBUMIN: 3.6 g/dL (ref 3.5–5.2)
ALK PHOS: 70 U/L (ref 39–117)
ALT: 19 U/L (ref 0–53)
AST: 17 U/L (ref 0–37)
Anion gap: 13 (ref 5–15)
BUN: 8 mg/dL (ref 6–23)
CHLORIDE: 106 meq/L (ref 96–112)
CO2: 24 mEq/L (ref 19–32)
Calcium: 9 mg/dL (ref 8.4–10.5)
Creatinine, Ser: 0.87 mg/dL (ref 0.50–1.35)
GFR calc Af Amer: >90 mL/min (ref >90)
GFR calc non Af Amer: >90 mL/min (ref >90)
GLUCOSE: 131 mg/dL — AB (ref 70–99)
POTASSIUM: 3.6 meq/L — AB (ref 3.7–5.3)
Sodium: 143 mEq/L (ref 137–147)
TOTAL PROTEIN: 6.5 g/dL (ref 6.0–8.3)
Total Bilirubin: 0.4 mg/dL (ref 0.3–1.2)

## 2014-06-13 LAB — CBC WITH DIFFERENTIAL/PLATELET
BASOS PCT: 0 % (ref 0–1)
Basophils Absolute: 0 10*3/uL (ref 0.0–0.1)
Eosinophils Absolute: 0.4 10*3/uL (ref 0.0–0.7)
Eosinophils Relative: 3 % (ref 0–5)
HCT: 42 % (ref 39.0–52.0)
HEMOGLOBIN: 14.3 g/dL (ref 13.0–17.0)
LYMPHS ABS: 4 10*3/uL (ref 0.7–4.0)
Lymphocytes Relative: 31 % (ref 12–46)
MCH: 30.4 pg (ref 26.0–34.0)
MCHC: 34 g/dL (ref 30.0–36.0)
MCV: 89.2 fL (ref 78.0–100.0)
MONOS PCT: 7 % (ref 3–12)
Monocytes Absolute: 0.9 10*3/uL (ref 0.1–1.0)
NEUTROS ABS: 7.7 10*3/uL (ref 1.7–7.7)
Neutrophils Relative %: 59 % (ref 43–77)
Platelets: 218 10*3/uL (ref 150–400)
RBC: 4.71 MIL/uL (ref 4.22–5.81)
RDW: 12.2 % (ref 11.5–15.5)
WBC: 12.9 10*3/uL — ABNORMAL HIGH (ref 4.0–10.5)

## 2014-06-13 LAB — BLOOD GAS, ARTERIAL
Acid-Base Excess: 0.7 mmol/L (ref 0.0–2.0)
BICARBONATE: 25.6 meq/L — AB (ref 20.0–24.0)
DRAWN BY: 23534
FIO2: 0.21 %
O2 CONTENT: 21 L/min
O2 Saturation: 98.2 %
PCO2 ART: 46.9 mmHg — AB (ref 35.0–45.0)
PH ART: 7.356 (ref 7.350–7.450)
PO2 ART: 126 mmHg — AB (ref 80.0–100.0)
Patient temperature: 37
TCO2: 22.8 mmol/L (ref 0–100)

## 2014-06-13 LAB — TROPONIN I: Troponin I: <0.3 ng/mL (ref <0.30)

## 2014-06-13 MED ORDER — IPRATROPIUM BROMIDE 0.02 % IN SOLN
0.5000 mg | Freq: Four times a day (QID) | RESPIRATORY_TRACT | Status: DC
  Administered 2014-06-13: 0.5 mg via RESPIRATORY_TRACT
  Filled 2014-06-13: qty 2.5

## 2014-06-13 MED ORDER — LEVOFLOXACIN 500 MG PO TABS
500.0000 mg | ORAL_TABLET | Freq: Once | ORAL | Status: AC
  Administered 2014-06-13: 500 mg via ORAL
  Filled 2014-06-13: qty 1

## 2014-06-13 MED ORDER — METHYLPREDNISOLONE SODIUM SUCC 125 MG IJ SOLR
125.0000 mg | Freq: Once | INTRAMUSCULAR | Status: AC
  Administered 2014-06-13: 125 mg via INTRAVENOUS

## 2014-06-13 MED ORDER — IPRATROPIUM-ALBUTEROL 0.5-2.5 (3) MG/3ML IN SOLN
3.0000 mL | Freq: Once | RESPIRATORY_TRACT | Status: AC
  Administered 2014-06-13: 3 mL via RESPIRATORY_TRACT

## 2014-06-13 MED ORDER — ENOXAPARIN SODIUM 40 MG/0.4ML ~~LOC~~ SOLN
40.0000 mg | SUBCUTANEOUS | Status: DC
  Administered 2014-06-13: 40 mg via SUBCUTANEOUS
  Filled 2014-06-13: qty 0.4

## 2014-06-13 MED ORDER — EPINEPHRINE 0.3 MG/0.3ML IJ SOAJ
0.3000 mg | Freq: Once | INTRAMUSCULAR | Status: AC
Start: 2014-06-13 — End: 2014-06-13
  Administered 2014-06-13: 0.3 mg via INTRAMUSCULAR

## 2014-06-13 MED ORDER — DIAZEPAM 5 MG PO TABS: 5.0000 mg | ORAL_TABLET | Freq: Three times a day (TID) | ORAL | Status: DC | PRN

## 2014-06-13 MED ORDER — ACETAMINOPHEN 650 MG RE SUPP: 650.0000 mg | Freq: Four times a day (QID) | RECTAL | Status: DC | PRN

## 2014-06-13 MED ORDER — HYDROMORPHONE HCL 1 MG/ML IJ SOLN
1.0000 mg | INTRAMUSCULAR | Status: DC | PRN
  Administered 2014-06-13 – 2014-06-14 (×2): 1 mg via INTRAVENOUS
  Filled 2014-06-13 (×3): qty 1

## 2014-06-13 MED ORDER — OXYCODONE-ACETAMINOPHEN 5-325 MG PO TABS
2.0000 | ORAL_TABLET | ORAL | Status: DC | PRN
  Administered 2014-06-13 – 2014-06-14 (×4): 2 via ORAL
  Filled 2014-06-13 (×4): qty 2

## 2014-06-13 MED ORDER — ONDANSETRON HCL 4 MG PO TABS: 4.0000 mg | ORAL_TABLET | Freq: Four times a day (QID) | ORAL | Status: DC | PRN

## 2014-06-13 MED ORDER — MORPHINE SULFATE 4 MG/ML IJ SOLN
INTRAMUSCULAR | Status: AC
  Filled 2014-06-13: qty 1

## 2014-06-13 MED ORDER — IPRATROPIUM BROMIDE 0.02 % IN SOLN
RESPIRATORY_TRACT | Status: AC
  Administered 2014-06-13: 0.5 mg
  Filled 2014-06-13: qty 2.5

## 2014-06-13 MED ORDER — GUAIFENESIN-DM 100-10 MG/5ML PO SYRP: 5.0000 mL | ORAL_SOLUTION | ORAL | Status: DC | PRN

## 2014-06-13 MED ORDER — HYDROMORPHONE HCL 1 MG/ML IJ SOLN
1.0000 mg | Freq: Once | INTRAMUSCULAR | Status: AC
  Administered 2014-06-13: 1 mg via INTRAVENOUS
  Filled 2014-06-13: qty 1

## 2014-06-13 MED ORDER — HYDROMORPHONE HCL 1 MG/ML IJ SOLN
0.5000 mg | INTRAMUSCULAR | Status: DC | PRN
  Administered 2014-06-13: 0.5 mg via INTRAVENOUS
  Filled 2014-06-13: qty 1

## 2014-06-13 MED ORDER — ALBUTEROL SULFATE (2.5 MG/3ML) 0.083% IN NEBU
2.5000 mg | INHALATION_SOLUTION | RESPIRATORY_TRACT | Status: DC | PRN
  Administered 2014-06-14: 2.5 mg via RESPIRATORY_TRACT
  Filled 2014-06-13: qty 3

## 2014-06-13 MED ORDER — MAGNESIUM SULFATE 2 GM/50ML IV SOLN
INTRAVENOUS | Status: AC
  Filled 2014-06-13: qty 50

## 2014-06-13 MED ORDER — MORPHINE SULFATE 4 MG/ML IJ SOLN
6.0000 mg | Freq: Once | INTRAMUSCULAR | Status: AC
  Administered 2014-06-13: 6 mg via INTRAVENOUS
  Filled 2014-06-13: qty 2

## 2014-06-13 MED ORDER — ALUM & MAG HYDROXIDE-SIMETH 200-200-20 MG/5ML PO SUSP: 30.0000 mL | Freq: Four times a day (QID) | ORAL | Status: DC | PRN

## 2014-06-13 MED ORDER — ALBUTEROL (5 MG/ML) CONTINUOUS INHALATION SOLN
INHALATION_SOLUTION | RESPIRATORY_TRACT | Status: AC
  Administered 2014-06-13: 15:00:00
  Filled 2014-06-13: qty 20

## 2014-06-13 MED ORDER — DOCUSATE SODIUM 100 MG PO CAPS: 100.0000 mg | ORAL_CAPSULE | Freq: Every day | ORAL | Status: DC | PRN

## 2014-06-13 MED ORDER — EPINEPHRINE 0.3 MG/0.3ML IJ SOAJ
INTRAMUSCULAR | Status: AC
  Filled 2014-06-13: qty 0.3

## 2014-06-13 MED ORDER — IPRATROPIUM-ALBUTEROL 0.5-2.5 (3) MG/3ML IN SOLN
3.0000 mL | Freq: Four times a day (QID) | RESPIRATORY_TRACT | Status: DC
  Administered 2014-06-14 (×3): 3 mL via RESPIRATORY_TRACT
  Filled 2014-06-13 (×3): qty 3

## 2014-06-13 MED ORDER — IPRATROPIUM-ALBUTEROL 0.5-2.5 (3) MG/3ML IN SOLN: 3.0000 mL | RESPIRATORY_TRACT | Status: DC

## 2014-06-13 MED ORDER — METHYLPREDNISOLONE SODIUM SUCC 125 MG IJ SOLR
60.0000 mg | Freq: Two times a day (BID) | INTRAMUSCULAR | Status: DC
  Administered 2014-06-13 – 2014-06-14 (×2): 60 mg via INTRAVENOUS
  Filled 2014-06-13 (×2): qty 2

## 2014-06-13 MED ORDER — MAGNESIUM SULFATE 2 GM/50ML IV SOLN
2.0000 g | Freq: Once | INTRAVENOUS | Status: AC
  Administered 2014-06-13: 2 g via INTRAVENOUS

## 2014-06-13 MED ORDER — ACETAMINOPHEN 325 MG PO TABS: 650.0000 mg | ORAL_TABLET | Freq: Four times a day (QID) | ORAL | Status: DC | PRN

## 2014-06-13 MED ORDER — ALBUTEROL SULFATE (2.5 MG/3ML) 0.083% IN NEBU
2.5000 mg | INHALATION_SOLUTION | Freq: Four times a day (QID) | RESPIRATORY_TRACT | Status: DC
  Administered 2014-06-13: 2.5 mg via RESPIRATORY_TRACT
  Filled 2014-06-13: qty 3

## 2014-06-13 MED ORDER — ALBUTEROL SULFATE (2.5 MG/3ML) 0.083% IN NEBU
10.0000 mg | INHALATION_SOLUTION | Freq: Once | RESPIRATORY_TRACT | Status: AC
  Administered 2014-06-13: 10 mg via RESPIRATORY_TRACT
  Filled 2014-06-13: qty 12

## 2014-06-13 MED ORDER — LEVOFLOXACIN 750 MG PO TABS
750.0000 mg | ORAL_TABLET | Freq: Every day | ORAL | Status: DC
  Administered 2014-06-14: 750 mg via ORAL
  Filled 2014-06-13: qty 1

## 2014-06-13 MED ORDER — DIAZEPAM 5 MG PO TABS
5.0000 mg | ORAL_TABLET | Freq: Once | ORAL | Status: AC
  Administered 2014-06-13: 5 mg via ORAL
  Filled 2014-06-13: qty 1

## 2014-06-13 MED ORDER — MORPHINE SULFATE 4 MG/ML IJ SOLN
4.0000 mg | Freq: Once | INTRAMUSCULAR | Status: AC
  Administered 2014-06-13: 4 mg via INTRAVENOUS

## 2014-06-13 MED ORDER — ALBUTEROL SULFATE (2.5 MG/3ML) 0.083% IN NEBU
INHALATION_SOLUTION | RESPIRATORY_TRACT | Status: AC
  Administered 2014-06-13: 2.5 mg
  Filled 2014-06-13: qty 3

## 2014-06-13 MED ORDER — ONDANSETRON HCL 4 MG/2ML IJ SOLN: 4.0000 mg | Freq: Four times a day (QID) | INTRAMUSCULAR | Status: DC | PRN

## 2014-06-13 MED ORDER — METHYLPREDNISOLONE SODIUM SUCC 125 MG IJ SOLR
INTRAMUSCULAR | Status: AC
  Filled 2014-06-13: qty 2

## 2014-06-13 NOTE — ED Notes (Signed)
PT c/o sob and chest pain x1 day with hx of pneumonia 2 weeks ago.

## 2014-06-13 NOTE — H&P (Signed)
History and Physical  [name] Carter UJW:[id number] DOB: 12-Jul-1960 DOA: 06/13/2014  Referring physician: Dr Patria Maneampos, ED physician PCP: No PCP Per Patient   Chief Complaint: Shortness of breath  HPI: [name] StallsLeo Carter is a 53 y.o. male  With a history of COPD, currently smoking, recent pneumonia with hospitalization on 05/13/2014. The patient started feeling short of breath approximately 4 days ago with increased sputum production and wheezing. This gradually progressed over the past 4 days. He did start a prednisone taper yesterday that he receive them review the emergency department approximately one week ago due to degenerative disc disease. He noted no improvement despite the 60 mg of prednisone that he took yesterday. He did continue to use his albuterol inhalers, which mildly helped.  Review of Systems:   Pt complains of right hand weakness and numbness with right neck pain - he has a history of degenerative disc disease and this is chronic in nature..  Pt denies any fevers, chills, nausea, vomiting, diarrhea, constipation, abdominal pain, slurred speech, headache, vision changes, dizziness, lightheadedness.  Review of systems are otherwise negative  Past Medical History  Diagnosis Date  . COPD (chronic obstructive pulmonary disease)   . Pneumonia   . Chronic back pain   . DDD (degenerative disc disease), lumbar   . DDD (degenerative disc disease), cervical    Past Surgical History  Procedure Laterality Date  . Back surgery      low back x4  . Bilateral carpal tunnel release    . Ulnar nerve transposition      ? by description, not clear  . Bilteral knee surgery     Social History:  reports that he has been smoking Cigarettes.  He has been smoking about 0.00 packs per day. He does not have any smokeless tobacco history on file. He reports that he does not drink alcohol or use illicit drugs. Patient lives at home & is able to participate in activities of daily living without  assistance  Allergies  Allergen Reactions  . Vicodin [Hydrocodone-Acetaminophen] Nausea And Vomiting and Other (See Comments)    Made patient feel funny, stomach pain    Family History  Problem Relation Age of Onset  . Heart attack Father      Prior to Admission medications   Medication Sig Start Date End Date Taking? Authorizing Provider  albuterol (PROVENTIL HFA;VENTOLIN HFA) 108 (90 BASE) MCG/ACT inhaler Inhale 2 puffs into the lungs every 4 (four) hours as needed for wheezing or shortness of breath (cough). 05/16/14  Yes Standley Brookinganiel P Goodrich, MD  albuterol (PROVENTIL) (2.5 MG/3ML) 0.083% nebulizer solution Take 3 mLs (2.5 mg total) by nebulization every 4 (four) hours as needed for wheezing or shortness of breath. 05/16/14  Yes Standley Brookinganiel P Goodrich, MD  diazepam (VALIUM) 5 MG tablet Take 1 tablet (5 mg total) by mouth every 8 (eight) hours as needed for muscle spasms. 06/08/14  Yes Raynelle FanningJulie Idol, PA-C  Ipratropium-Albuterol (COMBIVENT) 20-100 MCG/ACT AERS respimat Inhale 1 puff into the lungs 4 (four) times daily. Patient taking differently: Inhale 1 puff into the lungs daily.  05/16/14  Yes Standley Brookinganiel P Goodrich, MD  oxyCODONE-acetaminophen (PERCOCET) 5-325 MG per tablet Take 1 tablet by mouth every 4 (four) hours as needed for severe pain. Patient taking differently: Take 2 tablets by mouth every 4 (four) hours as needed for severe pain.  06/08/14  Yes Raynelle FanningJulie Idol, PA-C  predniSONE (DELTASONE) 10 MG tablet 6, 5, 4, 3, 2 then 1 tablet by mouth daily for 6 days  total. Patient taking differently: Take 10-60 mg by mouth See admin instructions. 6, 5, 4, 3, 2 then 1 tablet by mouth daily for 6 days total. 06/08/14  Yes Burgess Amor, PA-C  levofloxacin (LEVAQUIN) 750 MG tablet Take 1 tablet (750 mg total) by mouth daily. Patient not taking: Reported on 05/30/2014 05/16/14   Standley Brooking, MD  methocarbamol (ROBAXIN) 500 MG tablet Take 2 tablets (1,000 mg total) by mouth 4 (four) times daily as needed for  muscle spasms (muscle spasm/pain). Patient not taking: Reported on 06/13/2014 05/30/14   Samuel Jester, DO  Spacer/Aero-Holding Chambers (AEROCHAMBER PLUS WITH MASK) inhaler Use as instructed 05/05/14   Doug Sou, MD    Physical Exam: BP 144/86 mmHg  Pulse 82  Temp(Src) 98 F (36.7 C) (Oral)  Resp 19  SpO2 92%  General: Middle-aged Caucasian male. Awake and alert and oriented x3. No acute cardiopulmonary distress.  Eyes: Pupils equal, round, reactive to light. Extraocular muscles are intact. Sclerae anicteric and noninjected.  ENT: Moist mucosal membranes. No mucosal lesions.   Neck: Neck supple without lymphadenopathy. No carotid bruits. No masses palpated.  Cardiovascular: Regular rate with normal S1-S2 sounds. No murmurs, rubs, gallops auscultated. No JVD.  Respiratory: Diminished breath sounds throughout.  Coarse wheezes. No rales.  Abdomen: Soft, nontender, nondistended. Active bowel sounds. No masses or hepatosplenomegaly  Skin: Dry, warm to touch. 2+ dorsalis pedis and radial pulses. Musculoskeletal: No calf or leg pain. All major joints not erythematous nontender.  Psychiatric: Intact judgment and insight.  Neurologic: No focal neurological deficits. Cranial nerves II through XII are grossly intact.           Labs on Admission:  Basic Metabolic Panel:  Recent Labs Lab 06/13/14 1442  NA 143  K 3.6*  CL 106  CO2 24  GLUCOSE 131*  BUN 8  CREATININE 0.87  CALCIUM 9.0   Liver Function Tests:  Recent Labs Lab 06/13/14 1442  AST 17  ALT 19  ALKPHOS 70  BILITOT 0.4  PROT 6.5  ALBUMIN 3.6   No results for input(s): LIPASE, AMYLASE in the last 168 hours. No results for input(s): AMMONIA in the last 168 hours. CBC:  Recent Labs Lab 06/13/14 1442  WBC 12.9*  NEUTROABS 7.7  HGB 14.3  HCT 42.0  MCV 89.2  PLT 218   Cardiac Enzymes:  Recent Labs Lab 06/13/14 1442  TROPONINI <0.30    BNP (last 3 results)  Recent Labs  04/24/14 1144  05/13/14 1352  PROBNP 45.3 120.7   CBG: No results for input(s): GLUCAP in the last 168 hours.  Radiological Exams on Admission: Dg Chest Portable 1 View  06/13/2014   CLINICAL DATA:  Generalized chest pain, shortness of breath and wheezing which began earlier today. Current history of COPD. Current smoker.  EXAM: PORTABLE CHEST - 1 VIEW  COMPARISON:  Portable chest x-ray 05/13/2014, 05/09/2014, 04/24/2014.  FINDINGS: Cardiomediastinal silhouette unremarkable, unchanged. Prominent bronchovascular markings diffusely and moderate central peribronchial thickening, more so than on the prior examinations. No confluent airspace consolidation. No visible pleural effusions.  IMPRESSION: Moderate changes of acute bronchitis and/or asthma without focal airspace pneumonia.   Electronically Signed   By: Hulan Saas M.D.   On: 06/13/2014 15:13    EKG: Independently reviewed. Sinus tachycardia. Right fascicular block  Assessment/Plan Present on Admission:  . COPD exacerbation . Acute respiratory failure, unspecified whether with hypoxia or hypercapnia  #1 acute respiratory failure with hypoxia #2 COPD exacerbation Patient was given magnesium, epinephrine, and  continuous nebulizer treatment and was able to get the patient on nasal cannula. I will admit to MedSurg on nasal cannula for his COPD exacerbation. Continue Solu-Medrol as are the patient on Levaquin due to increased sputum production. No evidence of frank pneumonia. Continuing nebulizer treatments.  #3 neck pain/ degenerative disc disease Continue pain management with Valium and narcotics.   DVT prophylaxis: Lovenox  Consultants: None  Code Status: Full code  Family Communication: None   Disposition Plan: Home following stabilization  Time spent: 50 minutes  Candelaria CelesteJacob Aubriee Szeto, DO [facility] Pager [phone]

## 2014-06-13 NOTE — ED Notes (Signed)
Respiratory therapy paged d/t difficulty breathing.

## 2014-06-13 NOTE — ED Notes (Signed)
Patient requesting something for pain. EDP made aware. 

## 2014-06-13 NOTE — ED Notes (Signed)
Pt states he is breathing better.  States his pain is better.  Able to talk in complete sentences.

## 2014-06-13 NOTE — ED Provider Notes (Signed)
CSN: [id number]     Arrival date & time 06/13/14  1435 History  This chart was scribe for [name] Carter [name], MD by [name], ED Scribe. The patient was seen in room APA02/APA02 and the patient's care was started at 2:47 PM.     Chief Complaint  Patient presents with  . Shortness of Breath   The history is provided by the patient. No language interpreter was used.   Level V caveat: respiratory distress  HPI Comments: [name] StallsLeo Carter is a 53 y.o. male with a hx of pneumonia, COPD, and asthma who presents to the Emergency Department complaining of SOB which he adds has gotten worse today. He reports associated CP. Reports PNA recently. No fevers or chills. Feels SOB at this time and is asking to be sedated and placed on the ventilator   Past Medical History  Diagnosis Date  . COPD (chronic obstructive pulmonary disease)   . Pneumonia   . Chronic back pain   . DDD (degenerative disc disease), lumbar   . DDD (degenerative disc disease), cervical    Past Surgical History  Procedure Laterality Date  . Back surgery      low back x4  . Bilateral carpal tunnel release    . Ulnar nerve transposition      ? by description, not clear  . Bilteral knee surgery     Family History  Problem Relation Age of Onset  . Heart attack Father    History  Substance Use Topics  . Smoking status: Light Tobacco Smoker    Types: Cigarettes  . Smokeless tobacco: Not on file  . Alcohol Use: No    Review of Systems  Respiratory: Positive for shortness of breath.   Cardiovascular: Positive for chest pain.  All other systems reviewed and are negative.  A complete 10 system review of systems was obtained and all systems are negative except as noted in the HPI and PMH.     Allergies  Vicodin  Home Medications   Prior to Admission medications   Medication Sig Start Date End Date Taking? Authorizing Provider  albuterol (PROVENTIL HFA;VENTOLIN HFA) 108 (90 BASE) MCG/ACT inhaler Inhale 2 puffs  into the lungs every 4 (four) hours as needed for wheezing or shortness of breath (cough). 05/16/14   Standley Brookinganiel P Goodrich, MD  albuterol (PROVENTIL) (2.5 MG/3ML) 0.083% nebulizer solution Take 3 mLs (2.5 mg total) by nebulization every 4 (four) hours as needed for wheezing or shortness of breath. 05/16/14   Standley Brookinganiel P Goodrich, MD  diazepam (VALIUM) 5 MG tablet Take 1 tablet (5 mg total) by mouth every 8 (eight) hours as needed for muscle spasms. 06/08/14   Burgess AmorJulie Idol, PA-C  Ipratropium-Albuterol (COMBIVENT) 20-100 MCG/ACT AERS respimat Inhale 1 puff into the lungs 4 (four) times daily. 05/16/14   Standley Brookinganiel P Goodrich, MD  levofloxacin (LEVAQUIN) 750 MG tablet Take 1 tablet (750 mg total) by mouth daily. Patient not taking: Reported on 05/30/2014 05/16/14   Standley Brookinganiel P Goodrich, MD  methocarbamol (ROBAXIN) 500 MG tablet Take 2 tablets (1,000 mg total) by mouth 4 (four) times daily as needed for muscle spasms (muscle spasm/pain). 05/30/14   Samuel JesterKathleen McManus, DO  oxyCODONE-acetaminophen (PERCOCET) 5-325 MG per tablet Take 1 tablet by mouth every 4 (four) hours as needed for severe pain. 06/08/14   Burgess AmorJulie Idol, PA-C  predniSONE (DELTASONE) 10 MG tablet Start 11/15 . Take 40 mg by mouth daily for 4 days, then take 20 mg by mouth daily for 4 days, then  take 10 mg by mouth daily for 4 days, then stop. Patient not taking: Reported on 05/30/2014 05/16/14   Standley Brooking, MD  predniSONE (DELTASONE) 10 MG tablet 6, 5, 4, 3, 2 then 1 tablet by mouth daily for 6 days total. 06/08/14   Burgess Amor, PA-C  Spacer/Aero-Holding Chambers (AEROCHAMBER PLUS WITH MASK) inhaler Use as instructed 05/05/14   Doug Sou, MD   BP 114/53 mmHg  Pulse 109  Temp(Src) 98 F (36.7 C) (Oral)  Resp 23  SpO2 97% Physical Exam  Constitutional: He is oriented to person, place, and time. He appears well-developed. He appears distressed.  HENT:  Head: Normocephalic and atraumatic.  Eyes: Conjunctivae and EOM are normal.  Neck: Neck  supple. No tracheal deviation present.  Cardiovascular:  tachy  Pulmonary/Chest: He is in respiratory distress. He has wheezes.  Wheezing throughout, tachypnea.  Accessory muscle use.  Musculoskeletal: Normal range of motion. He exhibits no edema.  Neurological: He is alert and oriented to person, place, and time.  Skin: Skin is warm and dry.  Psychiatric: He has a normal mood and affect. His behavior is normal.  Nursing note and vitals reviewed.   ED Course  Procedures (including critical care time)  CRITICAL CARE Performed by: [name] Co Total critical care time: 40 Critical care time was exclusive of separately billable procedures and treating other patients. Critical care was necessary to treat or prevent imminent or life-threatening deterioration. Critical care was time spent personally by me on the following activities: development of treatment plan with patient and/or surrogate as well as nursing, discussions with consultants, evaluation of patient's response to treatment, examination of patient, obtaining history from patient or surrogate, ordering and performing treatments and interventions, ordering and review of laboratory studies, ordering and review of radiographic studies, pulse oximetry and re-evaluation of patient's condition.   DIAGNOSTIC STUDIES: Oxygen Saturation is 98% on [place], normal by my interpretation.    COORDINATION OF CARE: 2:57 PM- Pt advised of plan for treatment and pt agrees.    Labs Review Labs Reviewed  CBC WITH DIFFERENTIAL - Abnormal; Notable for the following:    WBC 12.9 (*)    All other components within normal limits  COMPREHENSIVE METABOLIC PANEL - Abnormal; Notable for the following:    Potassium 3.6 (*)    Glucose, Bld 131 (*)    All other components within normal limits  BLOOD GAS, ARTERIAL - Abnormal; Notable for the following:    pCO2 arterial 46.9 (*)    pO2, Arterial 126.0 (*)    Bicarbonate 25.6 (*)    All other components  within normal limits  TROPONIN I    Imaging Review Dg Chest Portable 1 View  06/13/2014   CLINICAL DATA:  Generalized chest pain, shortness of breath and wheezing which began earlier today. Current history of COPD. Current smoker.  EXAM: PORTABLE CHEST - 1 VIEW  COMPARISON:  Portable chest x-ray 05/13/2014, 05/09/2014, 04/24/2014.  FINDINGS: Cardiomediastinal silhouette unremarkable, unchanged. Prominent bronchovascular markings diffusely and moderate central peribronchial thickening, more so than on the prior examinations. No confluent airspace consolidation. No visible pleural effusions.  IMPRESSION: Moderate changes of acute bronchitis and/or asthma without focal airspace pneumonia.   Electronically Signed   By: Hulan Saas Carter.D.   On: 06/13/2014 15:13  I personally reviewed the imaging tests through PACS system I reviewed available ER/hospitalization records through the EMR    EKG Interpretation   Date/Time:  Saturday June 13 2014 14:39:58 EST Ventricular Rate:  111 PR  Interval:  187 QRS Duration: 93 QT Interval:  338 QTC Calculation: 459 R Axis:   -59 Text Interpretation:  Sinus tachycardia Left anterior fascicular block  Abnormal R-wave progression, late transition Artifact in lead(s) II III  aVL aVF V3 V4 No significant change was found Confirmed by Analis Distler  MD,  Onalee Steinbach ([id number]) on 06/13/2014 2:58:24 PM      MDM   Final diagnoses:  SOB (shortness of breath)   Pt in extremis on arrival. Given magnesium, IM epi, continuous nebulized treatment. Labs, cxr pending. Doubt PE. Wheezing on exam  4:56 PM Pt is much better at this time. Still requiring oxygen but respiratory rate improved to 18. Can speak in full sentences at this time. Will admit for ongoing care in the hospital  I personally performed the services described in this documentation, which was scribed in my presence. The recorded information has been reviewed and is accurate.  Medications  diazepam (VALIUM)  tablet 5 mg (not administered)  HYDROmorphone (DILAUDID) injection 1 mg (not administered)  ipratropium (ATROVENT) 0.02 % nebulizer solution (0.5 mg  Given 06/13/14 1504)  albuterol (PROVENTIL, VENTOLIN) (5 MG/ML) 0.5% continuous inhalation solution (  Given 06/13/14 1508)  ipratropium-albuterol (DUONEB) 0.5-2.5 (3) MG/3ML nebulizer solution 3 mL (3 mLs Nebulization Given 06/13/14 1508)  albuterol (PROVENTIL) (2.5 MG/3ML) 0.083% nebulizer solution (2.5 mg  Given 06/13/14 1502)  albuterol (PROVENTIL) (2.5 MG/3ML) 0.083% nebulizer solution 10 mg (10 mg Nebulization Given 06/13/14 1507)  magnesium sulfate IVPB 2 g 50 mL (0 g Intravenous Stopped 06/13/14 1554)  methylPREDNISolone sodium succinate (SOLU-MEDROL) 125 mg/2 mL injection 125 mg (125 mg Intravenous Given 06/13/14 1505)  EPINEPHrine (EPI-PEN) injection 0.3 mg (0.3 mg Intramuscular Given 06/13/14 1506)  morphine 4 MG/ML injection 4 mg (4 mg Intravenous Given 06/13/14 1505)  morphine 4 MG/ML injection 6 mg (6 mg Intravenous Given 06/13/14 1553)       [name] Carter [name] Wyeth, MD 06/13/14 [phone]

## 2014-06-14 ENCOUNTER — Encounter (HOSPITAL_COMMUNITY): Payer: Self-pay

## 2014-06-14 DIAGNOSIS — F17200 Nicotine dependence, unspecified, uncomplicated: Secondary | ICD-10-CM

## 2014-06-14 LAB — CBC
HCT: 40.5 % (ref 39.0–52.0)
Hemoglobin: 13.6 g/dL (ref 13.0–17.0)
MCH: 30.4 pg (ref 26.0–34.0)
MCHC: 33.6 g/dL (ref 30.0–36.0)
MCV: 90.6 fL (ref 78.0–100.0)
Platelets: 212 10*3/uL (ref 150–400)
RBC: 4.47 MIL/uL (ref 4.22–5.81)
RDW: 12.5 % (ref 11.5–15.5)
WBC: 21.5 10*3/uL — ABNORMAL HIGH (ref 4.0–10.5)

## 2014-06-14 LAB — BASIC METABOLIC PANEL
ANION GAP: 14 (ref 5–15)
BUN: 8 mg/dL (ref 6–23)
CO2: 22 mEq/L (ref 19–32)
Calcium: 9 mg/dL (ref 8.4–10.5)
Chloride: 104 mEq/L (ref 96–112)
Creatinine, Ser: 0.74 mg/dL (ref 0.50–1.35)
Glucose, Bld: 197 mg/dL — ABNORMAL HIGH (ref 70–99)
Potassium: 4.3 mEq/L (ref 3.7–5.3)
Sodium: 140 mEq/L (ref 137–147)

## 2014-06-14 MED ORDER — ALBUTEROL SULFATE HFA 108 (90 BASE) MCG/ACT IN AERS
2.0000 | INHALATION_SPRAY | RESPIRATORY_TRACT | Status: DC | PRN
  Filled 2014-06-14: qty 6.7

## 2014-06-14 MED ORDER — PREDNISONE 20 MG PO TABS: 40.0000 mg | ORAL_TABLET | Freq: Every day | ORAL | Status: DC

## 2014-06-14 MED ORDER — OXYCODONE-ACETAMINOPHEN 5-325 MG PO TABS: 1.0000 | ORAL_TABLET | Freq: Four times a day (QID) | ORAL | Status: DC | PRN

## 2014-06-14 MED ORDER — IPRATROPIUM-ALBUTEROL 20-100 MCG/ACT IN AERS: 1.0000 | INHALATION_SPRAY | Freq: Four times a day (QID) | RESPIRATORY_TRACT | Status: DC

## 2014-06-14 MED ORDER — HYDROMORPHONE HCL 1 MG/ML IJ SOLN
0.5000 mg | Freq: Once | INTRAMUSCULAR | Status: AC
  Administered 2014-06-14: 0.5 mg via INTRAVENOUS
  Filled 2014-06-14: qty 1

## 2014-06-14 MED ORDER — DOXYCYCLINE HYCLATE 100 MG PO TABS: 100.0000 mg | ORAL_TABLET | Freq: Two times a day (BID) | ORAL | Status: DC

## 2014-06-14 MED ORDER — MUSCLE RUB 10-15 % EX CREA
TOPICAL_CREAM | CUTANEOUS | Status: DC | PRN
  Filled 2014-06-14: qty 85

## 2014-06-14 MED ORDER — ALBUTEROL SULFATE HFA 108 (90 BASE) MCG/ACT IN AERS: 2.0000 | INHALATION_SPRAY | RESPIRATORY_TRACT | Status: DC | PRN

## 2014-06-14 MED ORDER — INFLUENZA VAC SPLIT QUAD 0.5 ML IM SUSY
0.5000 mL | PREFILLED_SYRINGE | Freq: Once | INTRAMUSCULAR | Status: AC
Start: 2014-06-14 — End: 2014-06-14
  Administered 2014-06-14: 0.5 mL via INTRAMUSCULAR
  Filled 2014-06-14: qty 0.5

## 2014-06-14 MED ORDER — PREDNISONE 10 MG PO TABS: ORAL_TABLET | ORAL | Status: DC

## 2014-06-14 NOTE — Progress Notes (Addendum)
PROGRESS NOTE  [name] Carter DZH:[id number] DOB: 1961/06/19 DOA: 06/13/2014 PCP: [place]  Summary: 53 year old man with history of COPD and ongoing tobacco dependence last admitted 05/2014 for acute respiratory failure without hypoxia, COPD exacerbation, community-acquired bacterial versus viral pneumonia. Clinically improved and was discharged with plans for outpatient follow-up with pulmonology. He has not yet been able to follow-up as an outpatient.  Presented to the emergency department with 12/12 with severe shortness of breath and associated chest pain. He was treated aggressively in the emergency department with improvement in admitted to the medical floor for acute respiratory failure with hypoxia, COPD exacerbation.  Assessment/Plan: 1. Acute respiratory failure without documented hypoxia. 98% on room air on presentation to the emergency department. Remains without hypoxia. ABG showed compensated hypercapnia of modest degree. 2. COPD exacerbation, acute bronchitis. 3. Atypical chest pain, chronic chest wall pain, secondary to COPD/bronchitis. 4. Tobacco dependence. Currently smokes 1 pack per day. Strongly encouraged cessation. 5. Admitted 11/25 for possible bacterial versus viral pneumonia. CT at that time was markedly abnormal. Patient has f/u arranged with Dr. Melvyn Novas 12/17.  6. Labs from 05/2014: HIV nonreactive, respiratory virus panel negative. ANA, c-ANCA, p-ANCA, atypical p-ANCA, Rheumatoid factor negative. ESR 9.   Overall appears much improved. No hypoxia. Ambulating in hallway, minimal increased work of breathing. Requesting discharge home today. He has outpatient follow-up arranged with pulmonology in [place].  Slowness prednisone taper, doxycycline.  I recommended stopping smoking.  He knows to return should his symptoms worsen.  Murray Hodgkins, MD  [facility]  Pager [phone] If 7PM-7AM, please contact night-coverage at [email or web address],  password [facility] 06/14/2014, 1:05 PM  LOS: 1 day   Consultants:    Procedures:    Antibiotics:  Levaquin 12/12  >> 12/13  Doxycycline 12/14 >> 12/18  HPI/Subjective: He is feeling better, breathing better. Still wheezing. Main complaint is chronic neck pain. He has outpatient follow-up arranged for this.  He has family coming into town today, his grandchildren are going to be in a Christmas play and he is requesting discharge so he may be a part of this. Is also his birthday.  Objective: Filed Vitals:   06/13/14 1911 06/13/14 2134 06/14/14 0121 06/14/14 0550  BP:  138/81  136/76  Pulse:  88  72  Temp:  97.9 F (36.6 C)  97.4 F (36.3 C)  TempSrc:  Oral  Oral  Resp:  19  19  Height:  $Remove'5\' 9"'[id number]$  (1.753 m)    Weight:  85.548 kg (188 lb 9.6 oz)    SpO2: 96% 96% 96% 92%    Intake/Output Summary (Last 24 hours) at 06/14/14 1305 Last data filed at 06/14/14 0956  Gross per 24 hour  Intake   1200 ml  Output   2825 ml  Net  -1625 ml     Filed Weights   06/13/14 2134  Weight: 85.548 kg (188 lb 9.6 oz)    Exam:     Afebrile, vital signs are stable. No hypoxia recorded.  General: Appears calm, comfortable. Ambulating in the hallway without difficulty. Was able to walk 50 feet and maintain conversation.  Psych: Alert. Speech fluent and clear.  CV: Regular rate and rhythm. No murmur, rub or gallop.  Respiratory: Diffuse wheezes bilaterally. Fair air movement. Able to speak in full sentence. Mild increased respiratory effort.  Musculoskeletal: Chronic neck pain. Ambulates well.  Data Reviewed:  ABG 7.356/46/126 on admission.  Basic metabolic panel unremarkable.  WBC 12.9 >> 21.5 on steroids.  Chest x-ray  revealed acute bronchitis without evidence of pneumonia.  EKG sinus tachycardia, left anterior fascicular block. No acute changes.  Scheduled Meds: . enoxaparin (LOVENOX) injection  40 mg Subcutaneous Q24H  . Influenza vac split quadrivalent PF  0.5 mL  Intramuscular Once  . ipratropium-albuterol  3 mL Nebulization Q6H  . levofloxacin  750 mg Oral Daily  . methylPREDNISolone (SOLU-MEDROL) injection  60 mg Intravenous Q12H   Continuous Infusions:   Active Problems:   COPD exacerbation   Acute respiratory failure with hypoxia

## 2014-06-14 NOTE — Discharge Summary (Signed)
Physician Discharge Summary  [name] Carter ELF:[id number] DOB: 1961-05-21 DOA: 06/13/2014  [facility]P: [name]  Pulmonologist: Dr. Melvyn Carter  Admit date: 06/13/2014 Discharge date: 06/14/2014  Recommendations for Outpatient Follow-up:  1. Resolution of COPD exacerbation, respiratory failure. 2. Further evaluation by pulmonology. Admitted 11/25 for possible bacterial versus viral pneumonia. CT at that time was markedly abnormal. Patient has f/u arranged with Dr. Melvyn Carter 12/17. Labs from 05/2014: HIV nonreactive, respiratory virus panel negative. ANA, c-ANCA, p-ANCA, atypical p-ANCA, Rheumatoid factor negative. ESR 9. 3. Continue to encourage smoking cessation.    Follow-up Information    Follow up with [name] Carter On 06/30/2014.   Why:  keep appointment already scheduled   Contact information:   [name] Carter [id number] [phone]       Follow up with [name] Gully, MD On 06/18/2014.   Specialty:  Pulmonary Disease   Why:  9:15 AM (lung doctor)   Contact information:   [address] [place] [address] [phone]      Discharge Diagnoses:  1. Acute respiratory failure without hypoxia. Compensated hypercapnia. 2. COPD exacerbation, acute bronchitis 3. Atypical chest pain 4. Tobacco dependence  Discharge Condition: Improved Disposition: Home  Diet recommendation: Regular  Filed Weights   06/13/14 2134  Weight: 85.548 kg (188 lb 9.6 oz)    History of present illness:  53 year old man with history of COPD and ongoing tobacco dependence last admitted 05/2014 for acute respiratory failure without hypoxia, COPD exacerbation, community-acquired bacterial versus viral pneumonia. Clinically improved and was discharged with plans for outpatient follow-up with pulmonology. He has not yet been able to follow-up as an outpatient.  Presented to the emergency department with 12/12 with severe shortness of breath and associated chest pain.  He was treated aggressively in the emergency department with improvement in admitted to the medical floor for acute respiratory failure with hypoxia, COPD exacerbation.  Hospital Course:  [name] Carter improved rapidly with steroids, antibiotics and bronchodilators. He has been ambulating in the hallway without hypoxia, speaking in full sentences and requests discharge home today. He has close outpatient follow-up arranged with pulmonology in the near future for further evaluation. This hospitalization was uncomplicated.  1. Acute respiratory failure without documented hypoxia. 98% on room air on presentation to the emergency department. Remains without hypoxia. ABG showed compensated hypercapnia of modest degree. 2. COPD exacerbation, acute bronchitis. Much improved. 3. Atypical chest pain, chronic chest wall pain, secondary to COPD/bronchitis. 4. Tobacco dependence. Currently smokes 1 pack per day. Strongly encouraged cessation. 5. Admitted 11/25 for possible bacterial versus viral pneumonia. CT at that time was markedly abnormal. Patient has f/u arranged with Dr. Melvyn Carter 12/17.  6. Labs from 05/2014: HIV nonreactive, respiratory virus panel negative. ANA, c-ANCA, p-ANCA, atypical p-ANCA, Rheumatoid factor negative. ESR 9.   Overall appears much improved. No hypoxia. Ambulating in hallway, minimal increased work of breathing. Requesting discharge home today. He has outpatient follow-up arranged with pulmonology in [place].  Slow prednisone taper, doxycycline.  I recommended stopping smoking.  He knows to return should his symptoms worsen.  Antibiotics: 7. Levaquin 12/12 >> 12/13 8. Doxycycline 12/14 >> 12/18  Discharge Instructions  Discharge Instructions    Activity as tolerated - No restrictions    Complete by:  As directed      Diet general    Complete by:  As directed      Discharge instructions    Complete by:  As directed   Call your physician or  seek immediate medical  attention for worsening shortness of breath, wheezing or worsening of condition.          Current Discharge Medication List    START taking these medications   Details  doxycycline (VIBRA-TABS) 100 MG tablet Take 1 tablet (100 mg total) by mouth every 12 (twelve) hours. Start 12/14. Qty: 10 tablet, Refills: 0      CONTINUE these medications which have CHANGED   Details  albuterol (PROVENTIL HFA;VENTOLIN HFA) 108 (90 BASE) MCG/ACT inhaler Inhale 2 puffs into the lungs every 4 (four) hours as needed for wheezing or shortness of breath. Qty: 1 Inhaler, Refills: 0    Ipratropium-Albuterol (COMBIVENT) 20-100 MCG/ACT AERS respimat Inhale 1 puff into the lungs 4 (four) times daily. Qty: 1 Inhaler, Refills: 0    oxyCODONE-acetaminophen (PERCOCET) 5-325 MG per tablet Take 1 tablet by mouth every 6 (six) hours as needed for severe pain. Qty: 30 tablet, Refills: 0    predniSONE (DELTASONE) 10 MG tablet Start 12/14 . Take 40 mg by mouth daily for 3 days, then take 20 mg by mouth daily for 3 days, then take 10 mg by mouth daily for 3 days, then stop. Qty: 21 tablet, Refills: 0      CONTINUE these medications which have NOT CHANGED   Details  albuterol (PROVENTIL) (2.5 MG/3ML) 0.083% nebulizer solution Take 3 mLs (2.5 mg total) by nebulization every 4 (four) hours as needed for wheezing or shortness of breath.    diazepam (VALIUM) 5 MG tablet Take 1 tablet (5 mg total) by mouth every 8 (eight) hours as needed for muscle spasms. Qty: 10 tablet, Refills: 0    Spacer/Aero-Holding Chambers (AEROCHAMBER PLUS WITH MASK) inhaler Use as instructed Qty: 1 each, Refills: 2      STOP taking these medications     levofloxacin (LEVAQUIN) 750 MG tablet      methocarbamol (ROBAXIN) 500 MG tablet        Allergies  Allergen Reactions  . Vicodin [Hydrocodone-Acetaminophen] Nausea And Vomiting and Other (See Comments)    Made patient feel funny, stomach pain    The results of significant  diagnostics from this hospitalization (including imaging, microbiology, ancillary and laboratory) are listed below for reference.    Significant Diagnostic Studies: Dg Chest Portable 1 View  06/13/2014   CLINICAL DATA:  Generalized chest pain, shortness of breath and wheezing which began earlier today. Current history of COPD. Current smoker.  EXAM: PORTABLE CHEST - 1 VIEW  COMPARISON:  Portable chest x-ray 05/13/2014, 05/09/2014, 04/24/2014.  FINDINGS: Cardiomediastinal silhouette unremarkable, unchanged. Prominent bronchovascular markings diffusely and moderate central peribronchial thickening, more so than on the prior examinations. No confluent airspace consolidation. No visible pleural effusions.  IMPRESSION: Moderate changes of acute bronchitis and/or asthma without focal airspace pneumonia.   Electronically Signed   By: Evangeline Dakin M.D.   On: 06/13/2014 15:13   Labs: Basic Metabolic Panel:  Recent Labs Lab 06/13/14 1442 06/14/14 0550  NA 143 140  K 3.6* 4.3  CL 106 104  CO2 24 22  GLUCOSE 131* 197*  BUN 8 8  CREATININE 0.87 0.74  CALCIUM 9.0 9.0   Liver Function Tests:  Recent Labs Lab 06/13/14 1442  AST 17  ALT 19  ALKPHOS 70  BILITOT 0.4  PROT 6.5  ALBUMIN 3.6   CBC:  Recent Labs Lab 06/13/14 1442 06/14/14 0550  WBC 12.9* 21.5*  NEUTROABS 7.7  --   HGB 14.3 13.6  HCT 42.0 40.5  MCV 89.2  90.6  PLT 218 212   Cardiac Enzymes:  Recent Labs Lab 06/13/14 1442  TROPONINI <0.30     Recent Labs  04/24/14 1144 05/13/14 1352  PROBNP 45.3 120.7    Principal Problem:   COPD exacerbation Active Problems:   Acute respiratory failure   Tobacco dependence   Time coordinating discharge: 35 minutes  Signed:  Murray Hodgkins, MD [facility] 06/14/2014, 1:52 PM

## 2014-06-14 NOTE — Progress Notes (Signed)
Patient was given discharge instructions, prescriptions, and COPD information packet with Zone Plan.  Patient verbalized understanding with no complaints or concerns voiced at this time.  IV was removed with catheter intact, no bleeding or complications.  Patient left unit in stable condition, ambulating, by staff member.

## 2014-06-14 NOTE — Progress Notes (Signed)
INITIAL NUTRITION ASSESSMENT  DOCUMENTATION CODES Per approved criteria  -Not Applicable   INTERVENTION: Encourage meal intake   Snack between meals   NUTRITION DIAGNOSIS: Inadequate oral intake related to COPD exacerbation as evidenced by 7% wt loss past month.    Goal: Pt will improve breathing ability to meet >90% of estimated nutrition needs  Monitor:  Po intake, labs and wt trends   Reason for Assessment: Malnutrition Screen  53 y.o. male   ASSESSMENT: Pt presents with COPD exacerbation. He has hx of multilpile visits to ED in past 6 months. He has weight loss over the past month (7%).  Height: Ht Readings from Last 1 Encounters:  06/13/14 5\' 9"  (1.753 m)    Weight: Wt Readings from Last 1 Encounters:  06/13/14 188 lb 9.6 oz (85.548 kg)    Ideal Body Weight: 160#  % Ideal Body Weight: 118%  Wt Readings from Last 10 Encounters:  06/13/14 188 lb 9.6 oz (85.548 kg)  06/08/14 178 lb (80.74 kg)  05/30/14 186 lb (84.369 kg)  05/15/14 180 lb 12.4 oz (82 kg)  05/07/14 203 lb (92.08 kg)  05/05/14 213 lb (96.616 kg)  04/24/14 212 lb (96.163 kg)  03/16/14 200 lb (90.719 kg)    Usual Body Weight: 200-210#  % Usual Body Weight: 93%  BMI:  Body mass index is 27.84 kg/(m^2). overweight  Estimated Nutritional Needs: Kcal: 2300-2600 Protein: 103-112 gr Fluid: 2.3-2.6 liters daily   Skin: intact  Diet Order: Diet Heart  EDUCATION NEEDS: -No education needs identified at this time   Intake/Output Summary (Last 24 hours) at 06/14/14 0547 Last data filed at 06/14/14 0530  Gross per 24 hour  Intake      0 ml  Output   2475 ml  Net  -2475 ml    Last BM: PTA  Labs:   Recent Labs Lab 06/13/14 1442  NA 143  K 3.6*  CL 106  CO2 24  BUN 8  CREATININE 0.87  CALCIUM 9.0  GLUCOSE 131*    CBG (last 3)  No results for input(s): GLUCAP in the last 72 hours.  Scheduled Meds: . enoxaparin (LOVENOX) injection  40 mg Subcutaneous Q24H  .  ipratropium-albuterol  3 mL Nebulization Q6H  . levofloxacin  750 mg Oral Daily  . methylPREDNISolone (SOLU-MEDROL) injection  60 mg Intravenous Q12H    Continuous Infusions:   Past Medical History  Diagnosis Date  . COPD (chronic obstructive pulmonary disease)   . Pneumonia   . Chronic back pain   . DDD (degenerative disc disease), lumbar   . DDD (degenerative disc disease), cervical     Past Surgical History  Procedure Laterality Date  . Back surgery      low back x4  . Bilateral carpal tunnel release    . Ulnar nerve transposition      ? by description, not clear  . Bilteral knee surgery      Royann ShiversLynn Srinivas Lippman MS,RD,CSG,LDN Office: #[phone] Pager: [phone]

## 2014-06-18 ENCOUNTER — Institutional Professional Consult (permissible substitution): Payer: Self-pay | Admitting: Internal Medicine

## 2014-06-18 NOTE — Care Management Utilization Note (Signed)
UR complete 

## 2014-06-22 ENCOUNTER — Emergency Department (HOSPITAL_COMMUNITY)
Admission: EM | Admit: 2014-06-22 | Discharge: 2014-06-22 | Disposition: A | Discharge status: Home / Self Care | Payer: Medicaid Other | Attending: Emergency Medicine | Admitting: Emergency Medicine

## 2014-06-22 ENCOUNTER — Encounter (HOSPITAL_COMMUNITY): Payer: Self-pay | Admitting: Emergency Medicine

## 2014-06-22 DIAGNOSIS — Z79899 Other long term (current) drug therapy: Secondary | ICD-10-CM | POA: Insufficient documentation

## 2014-06-22 DIAGNOSIS — M5030 Other cervical disc degeneration, unspecified cervical region: Secondary | ICD-10-CM | POA: Insufficient documentation

## 2014-06-22 DIAGNOSIS — Z8701 Personal history of pneumonia (recurrent): Secondary | ICD-10-CM | POA: Diagnosis not present

## 2014-06-22 DIAGNOSIS — Z720 Tobacco use: Secondary | ICD-10-CM | POA: Diagnosis not present

## 2014-06-22 DIAGNOSIS — M436 Torticollis: Secondary | ICD-10-CM | POA: Diagnosis not present

## 2014-06-22 DIAGNOSIS — J449 Chronic obstructive pulmonary disease, unspecified: Secondary | ICD-10-CM | POA: Diagnosis not present

## 2014-06-22 DIAGNOSIS — M542 Cervicalgia: Secondary | ICD-10-CM | POA: Diagnosis present

## 2014-06-22 HISTORY — DX: Cervicalgia: M54.2

## 2014-06-22 MED ORDER — ONDANSETRON HCL 4 MG PO TABS
4.0000 mg | ORAL_TABLET | Freq: Once | ORAL | Status: AC
  Administered 2014-06-22: 4 mg via ORAL
  Filled 2014-06-22: qty 1

## 2014-06-22 MED ORDER — PROMETHAZINE HCL 12.5 MG PO TABS: ORAL_TABLET | ORAL | Status: DC

## 2014-06-22 MED ORDER — DIAZEPAM 5 MG PO TABS: ORAL_TABLET | ORAL | Status: DC

## 2014-06-22 MED ORDER — KETOROLAC TROMETHAMINE 10 MG PO TABS
10.0000 mg | ORAL_TABLET | Freq: Once | ORAL | Status: AC
  Administered 2014-06-22: 10 mg via ORAL
  Filled 2014-06-22: qty 1

## 2014-06-22 MED ORDER — FENTANYL CITRATE 0.05 MG/ML IJ SOLN
100.0000 ug | Freq: Once | INTRAMUSCULAR | Status: AC
  Administered 2014-06-22: 100 ug via INTRAMUSCULAR
  Filled 2014-06-22: qty 2

## 2014-06-22 MED ORDER — ACETAMINOPHEN-CODEINE #3 300-30 MG PO TABS: 1.0000 | ORAL_TABLET | Freq: Four times a day (QID) | ORAL | Status: DC | PRN

## 2014-06-22 MED ORDER — DIAZEPAM 5 MG PO TABS
10.0000 mg | ORAL_TABLET | Freq: Once | ORAL | Status: AC
  Administered 2014-06-22: 10 mg via ORAL
  Filled 2014-06-22: qty 2

## 2014-06-22 NOTE — ED Provider Notes (Signed)
CSN: [id number]     Arrival date & time 06/22/14  1027 History  This chart was scribed for non-physician practitioner, Ivery QualeHobson Xianna Siverling, PA-C, working with Vanetta MuldersScott Zackowski, MD, by Ronney LionSuzanne Le, ED Scribe. This patient was seen in room APFT24/APFT24 and the patient's care was started at 11:31 AM.    Chief Complaint  Patient presents with  . Neck Pain   Patient is a 53 y.o. male presenting with neck pain. The history is provided by the patient. No language interpreter was used.  Neck Pain Pain location:  Generalized neck Pain severity:  Moderate Onset quality:  Gradual Timing:  Constant Progression:  Worsening Chronicity:  New Context: not recent injury   Relieved by: oxycodone. Risk factors: no recent head injury      HPI Comments: [name] Carter is a 53 y.o. male who presents to the Emergency Department complaining of gradually worsening neck pain that began about 5 weeks ago. Patient states that he had pneumonia around the same time. He was seen in the hospital about 2 weeks ago and given a CT scan for his neck. This is a new problem, and patient states that he had never had neck pain like this before until his pneumonia infection. Patient states that it is now so bad that he cannot straighten his neck, which causes discomfort to his back, since he has a history of 4 back surgeries. He has applied IcyHot with minimal relief and tried oxycodone with temporary relief. Patient has an appointment with a PCP next Tuesday.   Past Medical History  Diagnosis Date  . COPD (chronic obstructive pulmonary disease)   . Pneumonia   . Chronic back pain   . DDD (degenerative disc disease), lumbar   . DDD (degenerative disc disease), cervical   . Neck pain    Past Surgical History  Procedure Laterality Date  . Back surgery      low back x4  . Bilateral carpal tunnel release    . Ulnar nerve transposition      ? by description, not clear  . Bilteral knee surgery     Family History  Problem  Relation Age of Onset  . Heart attack Father    History  Substance Use Topics  . Smoking status: Heavy Tobacco Smoker -- 1.00 packs/day    Types: Cigarettes  . Smokeless tobacco: Not on file  . Alcohol Use: No    Review of Systems  Musculoskeletal: Positive for myalgias and neck pain.  All other systems reviewed and are negative.     Allergies  Vicodin  Home Medications   Prior to Admission medications   Medication Sig Start Date End Date Taking? Authorizing Provider  albuterol (PROVENTIL HFA;VENTOLIN HFA) 108 (90 BASE) MCG/ACT inhaler Inhale 2 puffs into the lungs every 4 (four) hours as needed for wheezing or shortness of breath. 06/14/14  Yes Standley Brookinganiel P Goodrich, MD  albuterol (PROVENTIL) (2.5 MG/3ML) 0.083% nebulizer solution Take 3 mLs (2.5 mg total) by nebulization every 4 (four) hours as needed for wheezing or shortness of breath. 05/16/14  Yes Standley Brookinganiel P Goodrich, MD  Ipratropium-Albuterol (COMBIVENT) 20-100 MCG/ACT AERS respimat Inhale 1 puff into the lungs 4 (four) times daily. 06/14/14  Yes Standley Brookinganiel P Goodrich, MD  diazepam (VALIUM) 5 MG tablet Take 1 tablet (5 mg total) by mouth every 8 (eight) hours as needed for muscle spasms. Patient not taking: Reported on 06/22/2014 06/08/14   Burgess AmorJulie Idol, PA-C  doxycycline (VIBRA-TABS) 100 MG tablet Take 1 tablet (100 mg total)  by mouth every 12 (twelve) hours. Start 12/14. Patient not taking: Reported on 06/22/2014 06/15/14   Standley Brooking, MD  oxyCODONE-acetaminophen (PERCOCET) 5-325 MG per tablet Take 1 tablet by mouth every 6 (six) hours as needed for severe pain. Patient not taking: Reported on 06/22/2014 06/14/14   Standley Brooking, MD  predniSONE (DELTASONE) 10 MG tablet Start 12/14 . Take 40 mg by mouth daily for 3 days, then take 20 mg by mouth daily for 3 days, then take 10 mg by mouth daily for 3 days, then stop. Patient not taking: Reported on 06/22/2014 06/14/14   Standley Brooking, MD  Spacer/Aero-Holding Chambers  (AEROCHAMBER PLUS WITH MASK) inhaler Use as instructed Patient not taking: Reported on 06/22/2014 05/05/14   Doug Sou, MD   BP 149/93 mmHg  Pulse 81  Temp(Src) 98 F (36.7 C) (Oral)  Resp 18  Ht 5\' 9"  (1.753 m)  Wt 182 lb (82.555 kg)  BMI 26.86 kg/m2  SpO2 100% Physical Exam  Constitutional: He is oriented to person, place, and time. He appears well-developed and well-nourished. No distress.  HENT:  Head: Normocephalic and atraumatic.  Eyes: Conjunctivae and EOM are normal.  Neck: Neck supple. No tracheal deviation present.  No carotid bruit.  Cardiovascular: Normal rate and regular rhythm.   Pulmonary/Chest: Effort normal. No respiratory distress.  Lungs are clear.  Musculoskeletal: Normal range of motion.  No temperature changes of the cervical area.  There is significant right paraspinal tightness and tenseness extending into the upper trapezius on the right. No palpable step-off.  Neurological: He is alert and oriented to person, place, and time. No cranial nerve deficit.  Grip is symmetrical. No atrophy of the thenar eminence on the right or the left. No sensory deficit of the upper extremity. No motor deficit of the upper extremity.  Skin: Skin is warm and dry.  Psychiatric: He has a normal mood and affect. His behavior is normal.  Nursing note and vitals reviewed.   ED Course  Procedures (including critical care time)  DIAGNOSTIC STUDIES: Oxygen Saturation is 100% on room air, normal by my interpretation.    COORDINATION OF CARE: 11:41 AM - Discussed treatment plan with pt at bedside which includes medication and pt agreed to plan.   Labs Review Labs Reviewed - No data to display  Imaging Review No results found.   EKG Interpretation None      MDM   I have reviewed the previous emergency department visits as well as the imaging studies. Patient has advanced arthritis involving the cervical spine. There is also degenerative disc disease present.  The examination today suggest torticollis involving the right neck and shoulder area. There is also suggestion of aggravation of the degenerative changes of the neck.   No gross neurologic deficits appreciated at this time.  The patient was treated in the emergency department with intramuscular fentanyl and oral Valium. There was some improvement in the pain. The plan at this time is for the patient be seen by with appendix specialist Dr. Eulah Pont for additional evaluation. The patient is waiting to see a primary care specialty physician in the next 1 or 2 weeks. Prescription for Valium 3 times daily, and Tylenol codeine 1 or 2 every 6 hours given to the patient, along with promethazine every 6 hours if needed for nausea.   Final diagnoses:  Torticollis  DDD (degenerative disc disease), cervical     **I have reviewed nursing notes, vital signs, and all appropriate lab and imaging  results for this patient.Kathie Dike*     Eryck Negron M Jolie Strohecker, PA-C 06/22/14 1259  Vanetta MuldersScott Zackowski, MD 06/22/14 [phone]

## 2014-06-22 NOTE — ED Notes (Signed)
Pt reports seen for same on 12/7 and reports is supposed to follow-up with ortho 12/29. Pt reports pain is getting worse. Pt denies any new or recent injury.

## 2014-06-22 NOTE — Discharge Instructions (Signed)
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 It is important that she see an orthopedic specialist concerning this. Please see the orthopedic specialist listed above, or the orthopedic specialist of your choice for evaluation and for management of this problem. Heating pad to the neck/shoulder Degenerative Disk Disease Degenerative disk disease is a condition caused by the changes that occur in the cushions of the backbone (spinal disks) as you grow older. Spinal disks are soft and compressible disks located between the bones of the spine (vertebrae). They act like shock absorbers. Degenerative disk disease can affect the whole spine. However, the neck and lower back are most commonly affected. Many changes can occur in the spinal disks with aging, such as:  The spinal disks may dry and shrink.  Small tears may occur in the tough, outer covering of the disk (annulus).  The disk space may become smaller due to loss of water.  Abnormal growths in the bone (spurs) may occur. This can put pressure on the nerve roots exiting the spinal canal, causing pain.  The spinal canal may become narrowed. CAUSES  Degenerative disk disease is a condition caused by the changes that occur in the spinal disks with aging. The exact cause is not known, but there is a genetic basis for many patients. Degenerative changes can occur due to loss of fluid in the disk. This makes the disk thinner and reduces the space between the backbones. Small cracks can develop in the outer layer of the disk. This can lead to the breakdown of the disk. You are more likely to get degenerative disk disease if you are overweight. Smoking cigarettes and doing heavy work such as weightlifting can also increase your risk of this condition. Degenerative changes can start after a sudden injury. Growth of bone spurs can compress the  nerve roots and cause pain.  SYMPTOMS  The symptoms vary from person to person. Some people may have no pain, while others have severe pain. The pain may be so severe that it can limit your activities. The location of the pain depends on the part of your backbone that is affected. You will have neck or arm pain if a disk in the neck area is affected. You will have pain in your back, buttocks, or legs if a disk in the lower back is affected. The pain becomes worse while bending, reaching up, or with twisting movements. The pain may start gradually and then get worse as time passes. It may also start after a major or minor injury. You may feel numbness or tingling in the arms or legs.  DIAGNOSIS  Your caregiver will ask you about your symptoms and about activities or habits that may cause the pain. He or she may also ask about any injuries, diseases, or treatments you have had earlier. Your caregiver will examine you to check for the range of movement that is possible in the affected area, to check for strength in your extremities, and to check for sensation in the areas of the arms and legs supplied by different nerve roots. An X-ray of the spine may be taken. Your caregiver may suggest other imaging tests, such as magnetic resonance imaging (MRI), if needed.  TREATMENT  Treatment includes rest, modifying your activities, and applying ice and heat. Your caregiver may prescribe medicines to reduce your pain and may ask you to do some exercises to strengthen your back.  In some cases, you may need surgery. You and your caregiver will decide on the treatment that is best for you. HOME CARE INSTRUCTIONS   Follow proper lifting and walking techniques as advised by your caregiver.  Maintain good posture.  Exercise regularly as advised.  Perform relaxation exercises.  Change your sitting, standing, and sleeping habits as advised. Change positions frequently.  Lose weight as advised.  Stop smoking if you  smoke.  Wear supportive footwear. SEEK MEDICAL CARE IF:  Your pain does not go away within 1 to 4 weeks. SEEK IMMEDIATE MEDICAL CARE IF:   Your pain is severe.  You notice weakness in your arms, hands, or legs.  You begin to lose control of your bladder or bowel movements. MAKE SURE YOU:   Understand these instructions.  Will watch your condition.  Will get help right away if you are not doing well or get worse. Document Released: 04/16/2007 Document Revised: 09/11/2011 Document Reviewed: 10/21/2013 [facility] Patient Information 2015 [place], [place]. This information is not intended to replace advice given to you by your health care provider. Make sure you discuss any questions you have with your health care provider.  Torticollis, Acute You have suddenly (acutely) developed a twisted neck (torticollis). This is usually a self-limited condition. CAUSES  Acute torticollis may be caused by malposition, trauma or infection. Most commonly, acute torticollis is caused by sleeping in an awkward position. Torticollis may also be caused by the flexion, extension or twisting of the neck muscles beyond their normal position. Sometimes, the exact cause may not be known. SYMPTOMS  Usually, there is pain and limited movement of the neck. Your neck may twist to one side. DIAGNOSIS  The diagnosis is often made by physical examination. X-rays, CT scans or MRIs may be done if there is a history of trauma or concern of infection. TREATMENT  For a common, stiff neck that develops during sleep, treatment is focused on relaxing the contracted neck muscle. Medications (including shots) may be used to treat the problem. Most cases resolve in several days. Torticollis usually responds to conservative physical therapy. If left untreated, the shortened and spastic neck muscle can cause deformities in the face and neck. Rarely, surgery is required. HOME CARE INSTRUCTIONS   Use over-the-counter and prescription  medications as directed by your caregiver.  Do stretching exercises and massage the neck as directed by your caregiver.  Follow up with physical therapy if needed and as directed by your caregiver. SEEK IMMEDIATE MEDICAL CARE IF:   You develop difficulty breathing or noisy breathing (stridor).  You drool, develop trouble swallowing or have pain with swallowing.  You develop numbness or weakness in the hands or feet.  You have changes in speech or vision.  You have problems with urination or bowel movements.  You have difficulty walking.  You have a fever.  You have increased pain. MAKE SURE YOU:   Understand these instructions.  Will watch your condition.  Will get help right away if you are not doing well or get worse. Document Released: 06/16/2000 Document Revised: 09/11/2011 Document Reviewed: 07/28/2009 [facility] Patient Information 2015 [place], [place]. This information is not intended to replace advice given to you by your health care provider. Make sure you discuss any questions you have with your health care provider.  will be helpful. Diazepam and Tylenol codeine because drowsiness, please use with caution when taking this medication. Please do not drink alcohol, drive, operate machinery, and legal documents, or pertussis pain in any activity  that requires concentration when taking these medications.

## 2014-06-26 ENCOUNTER — Emergency Department (HOSPITAL_COMMUNITY)
Admission: EM | Admit: 2014-06-26 | Discharge: 2014-06-26 | Disposition: A | Discharge status: Home / Self Care | Payer: Medicaid Other | Source: Home / Self Care | Attending: Emergency Medicine | Admitting: Emergency Medicine

## 2014-06-26 ENCOUNTER — Encounter (HOSPITAL_COMMUNITY): Payer: Self-pay | Admitting: *Deleted

## 2014-06-26 ENCOUNTER — Emergency Department (HOSPITAL_COMMUNITY): Payer: Medicaid Other

## 2014-06-26 DIAGNOSIS — Z79899 Other long term (current) drug therapy: Secondary | ICD-10-CM

## 2014-06-26 DIAGNOSIS — Z8701 Personal history of pneumonia (recurrent): Secondary | ICD-10-CM | POA: Insufficient documentation

## 2014-06-26 DIAGNOSIS — G8929 Other chronic pain: Secondary | ICD-10-CM | POA: Insufficient documentation

## 2014-06-26 DIAGNOSIS — F419 Anxiety disorder, unspecified: Secondary | ICD-10-CM | POA: Diagnosis present

## 2014-06-26 DIAGNOSIS — J441 Chronic obstructive pulmonary disease with (acute) exacerbation: Secondary | ICD-10-CM | POA: Diagnosis present

## 2014-06-26 DIAGNOSIS — Z79891 Long term (current) use of opiate analgesic: Secondary | ICD-10-CM

## 2014-06-26 DIAGNOSIS — M542 Cervicalgia: Secondary | ICD-10-CM

## 2014-06-26 DIAGNOSIS — J449 Chronic obstructive pulmonary disease, unspecified: Secondary | ICD-10-CM | POA: Insufficient documentation

## 2014-06-26 DIAGNOSIS — J9600 Acute respiratory failure, unspecified whether with hypoxia or hypercapnia: Principal | ICD-10-CM | POA: Diagnosis present

## 2014-06-26 DIAGNOSIS — Z885 Allergy status to narcotic agent status: Secondary | ICD-10-CM

## 2014-06-26 DIAGNOSIS — M5030 Other cervical disc degeneration, unspecified cervical region: Secondary | ICD-10-CM | POA: Diagnosis present

## 2014-06-26 DIAGNOSIS — E875 Hyperkalemia: Secondary | ICD-10-CM | POA: Diagnosis present

## 2014-06-26 DIAGNOSIS — E874 Mixed disorder of acid-base balance: Secondary | ICD-10-CM | POA: Diagnosis present

## 2014-06-26 DIAGNOSIS — Z720 Tobacco use: Secondary | ICD-10-CM

## 2014-06-26 DIAGNOSIS — Z7952 Long term (current) use of systemic steroids: Secondary | ICD-10-CM

## 2014-06-26 DIAGNOSIS — N179 Acute kidney failure, unspecified: Secondary | ICD-10-CM | POA: Diagnosis present

## 2014-06-26 DIAGNOSIS — R739 Hyperglycemia, unspecified: Secondary | ICD-10-CM | POA: Diagnosis present

## 2014-06-26 DIAGNOSIS — R0602 Shortness of breath: Secondary | ICD-10-CM

## 2014-06-26 DIAGNOSIS — T380X5A Adverse effect of glucocorticoids and synthetic analogues, initial encounter: Secondary | ICD-10-CM | POA: Diagnosis present

## 2014-06-26 DIAGNOSIS — F17210 Nicotine dependence, cigarettes, uncomplicated: Secondary | ICD-10-CM | POA: Diagnosis present

## 2014-06-26 LAB — CBC WITH DIFFERENTIAL/PLATELET
Basophils Absolute: 0 10*3/uL (ref 0.0–0.1)
Basophils Relative: 0 % (ref 0–1)
EOS ABS: 0.4 10*3/uL (ref 0.0–0.7)
EOS PCT: 4 % (ref 0–5)
HEMATOCRIT: 42 % (ref 39.0–52.0)
Hemoglobin: 14.2 g/dL (ref 13.0–17.0)
LYMPHS ABS: 2.9 10*3/uL (ref 0.7–4.0)
LYMPHS PCT: 34 % (ref 12–46)
MCH: 30.6 pg (ref 26.0–34.0)
MCHC: 33.8 g/dL (ref 30.0–36.0)
MCV: 90.5 fL (ref 78.0–100.0)
MONO ABS: 0.9 10*3/uL (ref 0.1–1.0)
Monocytes Relative: 11 % (ref 3–12)
Neutro Abs: 4.2 10*3/uL (ref 1.7–7.7)
Neutrophils Relative %: 51 % (ref 43–77)
PLATELETS: 212 10*3/uL (ref 150–400)
RBC: 4.64 MIL/uL (ref 4.22–5.81)
RDW: 12.5 % (ref 11.5–15.5)
WBC: 8.4 10*3/uL (ref 4.0–10.5)

## 2014-06-26 LAB — BASIC METABOLIC PANEL
Anion gap: 5 (ref 5–15)
BUN: 10 mg/dL (ref 6–23)
CALCIUM: 9 mg/dL (ref 8.4–10.5)
CO2: 26 mmol/L (ref 19–32)
Chloride: 107 mEq/L (ref 96–112)
Creatinine, Ser: 0.94 mg/dL (ref 0.50–1.35)
GFR calc Af Amer: >90 mL/min (ref >90)
GFR calc non Af Amer: >90 mL/min (ref >90)
GLUCOSE: 109 mg/dL — AB (ref 70–99)
Potassium: 3.9 mmol/L (ref 3.5–5.1)
SODIUM: 138 mmol/L (ref 135–145)

## 2014-06-26 LAB — TROPONIN I

## 2014-06-26 MED ORDER — METHYLPREDNISOLONE SODIUM SUCC 125 MG IJ SOLR
125.0000 mg | Freq: Once | INTRAMUSCULAR | Status: AC
  Administered 2014-06-26: 125 mg via INTRAVENOUS
  Filled 2014-06-26: qty 2

## 2014-06-26 MED ORDER — ALBUTEROL SULFATE (2.5 MG/3ML) 0.083% IN NEBU
2.5000 mg | INHALATION_SOLUTION | Freq: Once | RESPIRATORY_TRACT | Status: AC
  Administered 2014-06-26: 2.5 mg via RESPIRATORY_TRACT
  Filled 2014-06-26: qty 3

## 2014-06-26 MED ORDER — IPRATROPIUM-ALBUTEROL 0.5-2.5 (3) MG/3ML IN SOLN
3.0000 mL | Freq: Once | RESPIRATORY_TRACT | Status: AC
  Administered 2014-06-26: 3 mL via RESPIRATORY_TRACT
  Filled 2014-06-26: qty 3

## 2014-06-26 MED ORDER — PREDNISONE 20 MG PO TABS: 60.0000 mg | ORAL_TABLET | Freq: Every day | ORAL | Status: DC

## 2014-06-26 MED ORDER — MORPHINE SULFATE 4 MG/ML IJ SOLN
4.0000 mg | Freq: Once | INTRAMUSCULAR | Status: AC
  Administered 2014-06-26: 4 mg via INTRAVENOUS
  Filled 2014-06-26: qty 1

## 2014-06-26 MED ORDER — SODIUM CHLORIDE 0.9 % IV BOLUS (SEPSIS)
500.0000 mL | Freq: Once | INTRAVENOUS | Status: AC
  Administered 2014-06-26: 500 mL via INTRAVENOUS

## 2014-06-26 MED ORDER — DIAZEPAM 5 MG PO TABS: 5.0000 mg | ORAL_TABLET | Freq: Four times a day (QID) | ORAL | Status: DC | PRN

## 2014-06-26 MED ORDER — HYDROMORPHONE HCL 1 MG/ML IJ SOLN
1.0000 mg | Freq: Once | INTRAMUSCULAR | Status: AC
  Administered 2014-06-26: 1 mg via INTRAVENOUS
  Filled 2014-06-26: qty 1

## 2014-06-26 MED ORDER — DIAZEPAM 5 MG PO TABS
5.0000 mg | ORAL_TABLET | Freq: Once | ORAL | Status: AC
  Administered 2014-06-26: 5 mg via ORAL
  Filled 2014-06-26: qty 1

## 2014-06-26 MED ORDER — ALBUTEROL (5 MG/ML) CONTINUOUS INHALATION SOLN
10.0000 mg/h | INHALATION_SOLUTION | RESPIRATORY_TRACT | Status: DC
  Administered 2014-06-26: 10 mg/h via RESPIRATORY_TRACT
  Filled 2014-06-26: qty 20

## 2014-06-26 NOTE — Discharge Instructions (Signed)

## 2014-06-26 NOTE — ED Notes (Signed)
Pt with SOB, audible wheezing started today, nebs done PTA

## 2014-06-26 NOTE — ED Provider Notes (Signed)
CSN: [id number]     Arrival date & time 06/26/14  1517 History  This chart was scribed for American Expressathan R. Rubin PayorPickering, MD by Tonye RoyaltyJoshua Chen, ED Scribe. This patient was seen in room APA02/APA02 and the patient's care was started at 3:34 PM.    Chief Complaint  Patient presents with  . Shortness of Breath   The history is provided by the patient. No language interpreter was used.    HPI Comments: [name] Carter is a 53 y.o. male with history of COPD who presents to the Emergency Department complaining of SOB, worsening this morning. He states it is similar to previous symptoms. He reports associated productive cough and wheezing. He notes right-sided neck pain that he believes is worsening his SOB; he states he is scheduled for surgery soon. When he was treated for similar symptoms 2 weeks ago, he requested sedation and his symptoms apparently improved after pain medication. He denies fever. He denies recent antibiotic use.   Past Medical History  Diagnosis Date  . COPD (chronic obstructive pulmonary disease)   . Pneumonia   . Chronic back pain   . DDD (degenerative disc disease), lumbar   . DDD (degenerative disc disease), cervical   . Neck pain    Past Surgical History  Procedure Laterality Date  . Back surgery      low back x4  . Bilateral carpal tunnel release    . Ulnar nerve transposition      ? by description, not clear  . Bilteral knee surgery     Family History  Problem Relation Age of Onset  . Heart attack Father    History  Substance Use Topics  . Smoking status: Heavy Tobacco Smoker -- 1.00 packs/day    Types: Cigarettes  . Smokeless tobacco: Not on file  . Alcohol Use: No    Review of Systems  Constitutional: Negative for fever.  Respiratory: Positive for cough, shortness of breath and wheezing.   Musculoskeletal: Positive for neck pain.  All other systems reviewed and are negative.     Allergies  Vicodin  Home Medications   Prior to Admission medications    Medication Sig Start Date End Date Taking? Authorizing Provider  acetaminophen-codeine (TYLENOL #3) 300-30 MG per tablet Take 1-2 tablets by mouth every 6 (six) hours as needed. Patient taking differently: Take 1-2 tablets by mouth every 6 (six) hours as needed for moderate pain.  06/22/14  Yes Kathie DikeHobson M Bryant, PA-C  albuterol (PROVENTIL HFA;VENTOLIN HFA) 108 (90 BASE) MCG/ACT inhaler Inhale 2 puffs into the lungs every 4 (four) hours as needed for wheezing or shortness of breath. 06/14/14  Yes Standley Brookinganiel P Goodrich, MD  albuterol (PROVENTIL) (2.5 MG/3ML) 0.083% nebulizer solution Take 3 mLs (2.5 mg total) by nebulization every 4 (four) hours as needed for wheezing or shortness of breath. 05/16/14  Yes Standley Brookinganiel P Goodrich, MD  Ipratropium-Albuterol (COMBIVENT) 20-100 MCG/ACT AERS respimat Inhale 1 puff into the lungs 4 (four) times daily. 06/14/14  Yes Standley Brookinganiel P Goodrich, MD  promethazine (PHENERGAN) 12.5 MG tablet 1 po q6h prn nausea Patient taking differently: Take 12.5 mg by mouth every 6 (six) hours as needed for nausea. 1 po q6h prn nausea 06/22/14  Yes Kathie DikeHobson M Bryant, PA-C  diazepam (VALIUM) 5 MG tablet Take 1 tablet (5 mg total) by mouth every 6 (six) hours as needed for muscle spasms. 06/26/14   Juliet RudeNathan R. Chyler Creely, MD  doxycycline (VIBRA-TABS) 100 MG tablet Take 1 tablet (100 mg total) by mouth every 12 (  twelve) hours. Start 12/14. Patient not taking: Reported on 06/22/2014 06/15/14   Standley Brookinganiel P Goodrich, MD  oxyCODONE-acetaminophen (PERCOCET) 5-325 MG per tablet Take 1 tablet by mouth every 6 (six) hours as needed for severe pain. Patient not taking: Reported on 06/22/2014 06/14/14   Standley Brookinganiel P Goodrich, MD  predniSONE (DELTASONE) 10 MG tablet Start 12/14 . Take 40 mg by mouth daily for 3 days, then take 20 mg by mouth daily for 3 days, then take 10 mg by mouth daily for 3 days, then stop. Patient not taking: Reported on 06/22/2014 06/14/14   Standley Brookinganiel P Goodrich, MD  predniSONE (DELTASONE) 20 MG  tablet Take 3 tablets (60 mg total) by mouth daily. 06/26/14   Juliet RudeNathan R. Rubin PayorPickering, MD  Spacer/Aero-Holding Chambers (AEROCHAMBER PLUS WITH MASK) inhaler Use as instructed Patient not taking: Reported on 06/22/2014 05/05/14   Doug SouSam Jacubowitz, MD   BP 126/103 mmHg  Pulse 79  Temp(Src) 98.7 F (37.1 C) (Oral)  Resp 16  SpO2 94% Physical Exam  Constitutional: He is oriented to person, place, and time. He appears well-developed and well-nourished.  HENT:  Head: Normocephalic and atraumatic.  Head and face are flushed  Eyes: Conjunctivae are normal.  Neck: Neck supple.  decreased ROM of neck  Cardiovascular: Regular rhythm.   No murmur heard. tachycardic  Pulmonary/Chest: He has wheezes. He has no rales.  Wheezes in throat, likey patient induced, and some deep lung wheezing as well Tachypneic Not hypoxic Diffuse harsh breath sounds but there are transmitted upper airway sounds too  Abdominal: There is no tenderness.  Musculoskeletal: Normal range of motion. He exhibits no edema.  No peripheral edema  Neurological: He is alert and oriented to person, place, and time.  Skin: Skin is warm and dry.  Psychiatric: He has a normal mood and affect.  Nursing note and vitals reviewed.   ED Course  Procedures (including critical care time)\   COORDINATION OF CARE: 3:41 PM Discussed treatment plan with patient at beside, the patient agrees with the plan and has no further questions at this time.   Labs Review Labs Reviewed  BASIC METABOLIC PANEL - Abnormal; Notable for the following:    Glucose, Bld 109 (*)    All other components within normal limits  CBC WITH DIFFERENTIAL  TROPONIN I    Imaging Review Dg Chest 1 View  06/26/2014   CLINICAL DATA:  Severe shortness of breath, chest pain, history COPD, pneumonia, smoking  EXAM: CHEST - 1 VIEW  COMPARISON:  Portable exam 1527 hr compared to 06/13/2014  FINDINGS: Normal heart size, mediastinal contours and pulmonary vascularity.   Lungs clear.  No pleural effusion or pneumothorax.  Osseous structures unremarkable.  IMPRESSION: No acute abnormalities.   Electronically Signed   By: Ulyses SouthwardMark  Boles M.D.   On: 06/26/2014 15:45     EKG Interpretation   Date/Time:  Friday June 26 2014 15:28:07 EST Ventricular Rate:  124 PR Interval:  133 QRS Duration: 93 QT Interval:  331 QTC Calculation: 475 R Axis:   -83 Text Interpretation:  Sinus tachycardia Left anterior fascicular block  Abnormal R-wave progression, late transition Consider left ventricular  hypertrophy Artifact in lead(s) II III aVR aVL aVF V1 V2 V3 V4 V5 V6 and  baseline wander in lead(s) III V3 V5 Confirmed by Kezia Benevides  MD, Katty Fretwell  [phone]) on 06/26/2014 11:36:24 PM      MDM   Final diagnoses:  Chronic obstructive pulmonary disease, unspecified COPD, unspecified chronic bronchitis type  Neck pain  Patient with shortness of breath. Previous visits admission for same. Has history of COPD. Also has chronic neck pain and spasm. States he is going to see a Careers adviser but cannot see that until he has insurance after the new year. Has had 7 prescriptions from the ER and inpatient last month and a half. We'll not give narcotics. Lab work and x-ray reassuring. Breathing improved after treatments and Valium. Will discharge her some Valium.  I personally performed the services described in this documentation, which was scribed in my presence. The recorded information has been reviewed and is accurate.     Juliet Rude. Rubin Payor, MD 06/26/14 [phone]

## 2014-06-27 ENCOUNTER — Emergency Department (HOSPITAL_COMMUNITY): Payer: Medicaid Other

## 2014-06-27 ENCOUNTER — Encounter (HOSPITAL_COMMUNITY): Payer: Self-pay | Admitting: Emergency Medicine

## 2014-06-27 ENCOUNTER — Inpatient Hospital Stay (HOSPITAL_COMMUNITY)
Admission: EM | Admit: 2014-06-27 | Discharge: 2014-07-01 | DRG: 189 | Disposition: A | Discharge status: Home / Self Care | Payer: Medicaid Other | Attending: Internal Medicine | Admitting: Internal Medicine

## 2014-06-27 DIAGNOSIS — J9600 Acute respiratory failure, unspecified whether with hypoxia or hypercapnia: Secondary | ICD-10-CM | POA: Diagnosis present

## 2014-06-27 DIAGNOSIS — R202 Paresthesia of skin: Secondary | ICD-10-CM

## 2014-06-27 DIAGNOSIS — T380X5A Adverse effect of glucocorticoids and synthetic analogues, initial encounter: Secondary | ICD-10-CM | POA: Diagnosis present

## 2014-06-27 DIAGNOSIS — J441 Chronic obstructive pulmonary disease with (acute) exacerbation: Secondary | ICD-10-CM | POA: Diagnosis present

## 2014-06-27 DIAGNOSIS — Z885 Allergy status to narcotic agent status: Secondary | ICD-10-CM | POA: Diagnosis not present

## 2014-06-27 DIAGNOSIS — E875 Hyperkalemia: Secondary | ICD-10-CM | POA: Diagnosis present

## 2014-06-27 DIAGNOSIS — N179 Acute kidney failure, unspecified: Secondary | ICD-10-CM | POA: Diagnosis present

## 2014-06-27 DIAGNOSIS — R200 Anesthesia of skin: Secondary | ICD-10-CM

## 2014-06-27 DIAGNOSIS — Z79899 Other long term (current) drug therapy: Secondary | ICD-10-CM | POA: Diagnosis not present

## 2014-06-27 DIAGNOSIS — F17210 Nicotine dependence, cigarettes, uncomplicated: Secondary | ICD-10-CM | POA: Diagnosis present

## 2014-06-27 DIAGNOSIS — M5030 Other cervical disc degeneration, unspecified cervical region: Secondary | ICD-10-CM | POA: Diagnosis present

## 2014-06-27 DIAGNOSIS — M542 Cervicalgia: Secondary | ICD-10-CM

## 2014-06-27 DIAGNOSIS — E874 Mixed disorder of acid-base balance: Secondary | ICD-10-CM | POA: Diagnosis present

## 2014-06-27 DIAGNOSIS — G8929 Other chronic pain: Secondary | ICD-10-CM | POA: Diagnosis present

## 2014-06-27 DIAGNOSIS — R062 Wheezing: Secondary | ICD-10-CM

## 2014-06-27 DIAGNOSIS — R739 Hyperglycemia, unspecified: Secondary | ICD-10-CM | POA: Diagnosis present

## 2014-06-27 DIAGNOSIS — Z7952 Long term (current) use of systemic steroids: Secondary | ICD-10-CM | POA: Diagnosis not present

## 2014-06-27 DIAGNOSIS — F17200 Nicotine dependence, unspecified, uncomplicated: Secondary | ICD-10-CM

## 2014-06-27 DIAGNOSIS — Z79891 Long term (current) use of opiate analgesic: Secondary | ICD-10-CM | POA: Diagnosis not present

## 2014-06-27 DIAGNOSIS — F419 Anxiety disorder, unspecified: Secondary | ICD-10-CM | POA: Diagnosis present

## 2014-06-27 LAB — CBC WITH DIFFERENTIAL/PLATELET
Basophils Absolute: 0 10*3/uL (ref 0.0–0.1)
Basophils Relative: 0 % (ref 0–1)
Eosinophils Absolute: 0 10*3/uL (ref 0.0–0.7)
Eosinophils Relative: 0 % (ref 0–5)
HEMATOCRIT: 41.9 % (ref 39.0–52.0)
HEMOGLOBIN: 13.9 g/dL (ref 13.0–17.0)
Lymphocytes Relative: 9 % — ABNORMAL LOW (ref 12–46)
Lymphs Abs: 1 10*3/uL (ref 0.7–4.0)
MCH: 29.9 pg (ref 26.0–34.0)
MCHC: 33.2 g/dL (ref 30.0–36.0)
MCV: 90.1 fL (ref 78.0–100.0)
MONO ABS: 0.1 10*3/uL (ref 0.1–1.0)
MONOS PCT: 1 % — AB (ref 3–12)
NEUTROS ABS: 10.6 10*3/uL — AB (ref 1.7–7.7)
Neutrophils Relative %: 90 % — ABNORMAL HIGH (ref 43–77)
Platelets: 220 10*3/uL (ref 150–400)
RBC: 4.65 MIL/uL (ref 4.22–5.81)
RDW: 12.7 % (ref 11.5–15.5)
WBC: 11.8 10*3/uL — ABNORMAL HIGH (ref 4.0–10.5)

## 2014-06-27 LAB — LACTIC ACID, PLASMA: Lactic Acid, Venous: 2.9 mmol/L — ABNORMAL HIGH (ref 0.5–2.2)

## 2014-06-27 LAB — I-STAT ARTERIAL BLOOD GAS, ED
ACID-BASE DEFICIT: 7 mmol/L — AB (ref 0.0–2.0)
ACID-BASE DEFICIT: 10 mmol/L — AB (ref 0.0–2.0)
Acid-base deficit: 7 mmol/L — ABNORMAL HIGH (ref 0.0–2.0)
BICARBONATE: 19.9 meq/L — AB (ref 20.0–24.0)
BICARBONATE: 17 meq/L — AB (ref 20.0–24.0)
BICARBONATE: 19.5 meq/L — AB (ref 20.0–24.0)
O2 SAT: 97 %
O2 SAT: 95 %
O2 SAT: 100 %
PH ART: 7.246 — AB (ref 7.350–7.450)
PH ART: 7.27 — AB (ref 7.350–7.450)
PO2 ART: 101 mmHg — AB (ref 80.0–100.0)
PO2 ART: 211 mmHg — AB (ref 80.0–100.0)
Patient temperature: 98.6
Patient temperature: 98.6
TCO2: 21 mmol/L (ref 0–100)
TCO2: 18 mmol/L (ref 0–100)
TCO2: 21 mmol/L (ref 0–100)
pCO2 arterial: 43.8 mmHg (ref 35.0–45.0)
pCO2 arterial: 39.2 mmHg (ref 35.0–45.0)
pCO2 arterial: 42.4 mmHg (ref 35.0–45.0)
pH, Arterial: 7.265 — ABNORMAL LOW (ref 7.350–7.450)
pO2, Arterial: 88 mmHg (ref 80.0–100.0)

## 2014-06-27 LAB — GLUCOSE, CAPILLARY
GLUCOSE-CAPILLARY: 144 mg/dL — AB (ref 70–99)
Glucose-Capillary: 162 mg/dL — ABNORMAL HIGH (ref 70–99)

## 2014-06-27 LAB — SALICYLATE LEVEL: Salicylate Lvl: <4 mg/dL (ref 2.8–20.0)

## 2014-06-27 LAB — BASIC METABOLIC PANEL
Anion gap: 11 (ref 5–15)
BUN: 13 mg/dL (ref 6–23)
CO2: 23 mmol/L (ref 19–32)
CREATININE: 1.55 mg/dL — AB (ref 0.50–1.35)
Calcium: 9.1 mg/dL (ref 8.4–10.5)
Chloride: 107 mEq/L (ref 96–112)
GFR calc Af Amer: 57 mL/min — ABNORMAL LOW (ref >90)
GFR calc non Af Amer: 49 mL/min — ABNORMAL LOW (ref >90)
Glucose, Bld: 265 mg/dL — ABNORMAL HIGH (ref 70–99)
Potassium: 4.9 mmol/L (ref 3.5–5.1)
Sodium: 141 mmol/L (ref 135–145)

## 2014-06-27 LAB — TROPONIN I
Troponin I: 0.03 ng/mL (ref <0.031)
Troponin I: 0.13 ng/mL — ABNORMAL HIGH (ref <0.031)

## 2014-06-27 LAB — MRSA PCR SCREENING: MRSA by PCR: NEGATIVE

## 2014-06-27 MED ORDER — MORPHINE SULFATE 4 MG/ML IJ SOLN
4.0000 mg | Freq: Once | INTRAMUSCULAR | Status: AC
  Administered 2014-06-27: 4 mg via INTRAVENOUS
  Filled 2014-06-27: qty 1

## 2014-06-27 MED ORDER — ALBUTEROL (5 MG/ML) CONTINUOUS INHALATION SOLN
15.0000 mg | INHALATION_SOLUTION | RESPIRATORY_TRACT | Status: DC
  Administered 2014-06-27: 15 mg via RESPIRATORY_TRACT
  Filled 2014-06-27: qty 20

## 2014-06-27 MED ORDER — ONDANSETRON HCL 4 MG PO TABS: 4.0000 mg | ORAL_TABLET | Freq: Four times a day (QID) | ORAL | Status: DC | PRN

## 2014-06-27 MED ORDER — FENTANYL CITRATE 0.05 MG/ML IJ SOLN
25.0000 ug | INTRAMUSCULAR | Status: DC | PRN
  Administered 2014-06-27 – 2014-06-29 (×10): 25 ug via INTRAVENOUS
  Filled 2014-06-27 (×9): qty 2

## 2014-06-27 MED ORDER — NICOTINE 14 MG/24HR TD PT24
14.0000 mg | MEDICATED_PATCH | Freq: Every day | TRANSDERMAL | Status: DC
  Administered 2014-06-27 – 2014-07-01 (×5): 14 mg via TRANSDERMAL
  Filled 2014-06-27 (×5): qty 1

## 2014-06-27 MED ORDER — DOCUSATE SODIUM 100 MG PO CAPS
100.0000 mg | ORAL_CAPSULE | Freq: Two times a day (BID) | ORAL | Status: DC
  Administered 2014-06-28 – 2014-07-01 (×7): 100 mg via ORAL
  Filled 2014-06-27 (×7): qty 1

## 2014-06-27 MED ORDER — LORAZEPAM 2 MG/ML IJ SOLN
INTRAMUSCULAR | Status: AC
  Filled 2014-06-27: qty 1

## 2014-06-27 MED ORDER — ACETAMINOPHEN 325 MG PO TABS: 650.0000 mg | ORAL_TABLET | Freq: Four times a day (QID) | ORAL | Status: DC | PRN

## 2014-06-27 MED ORDER — FENTANYL CITRATE 0.05 MG/ML IJ SOLN
INTRAMUSCULAR | Status: AC
  Administered 2014-06-27: 25 ug via INTRAVENOUS
  Filled 2014-06-27: qty 2

## 2014-06-27 MED ORDER — SODIUM CHLORIDE 0.9 % IJ SOLN
3.0000 mL | Freq: Two times a day (BID) | INTRAMUSCULAR | Status: DC
  Administered 2014-06-28 – 2014-06-29 (×4): 3 mL via INTRAVENOUS
  Administered 2014-06-30: 10 mL via INTRAVENOUS
  Administered 2014-06-30: 3 mL via INTRAVENOUS

## 2014-06-27 MED ORDER — METHYLPREDNISOLONE SODIUM SUCC 125 MG IJ SOLR
INTRAMUSCULAR | Status: AC
  Administered 2014-06-27: 125 mg
  Filled 2014-06-27: qty 2

## 2014-06-27 MED ORDER — IPRATROPIUM BROMIDE 0.02 % IN SOLN
0.5000 mg | RESPIRATORY_TRACT | Status: DC
  Administered 2014-06-27: 0.5 mg via RESPIRATORY_TRACT
  Filled 2014-06-27: qty 2.5

## 2014-06-27 MED ORDER — LEVOFLOXACIN IN D5W 500 MG/100ML IV SOLN
500.0000 mg | INTRAVENOUS | Status: DC
  Administered 2014-06-27 – 2014-07-01 (×5): 500 mg via INTRAVENOUS
  Filled 2014-06-27 (×5): qty 100

## 2014-06-27 MED ORDER — IPRATROPIUM BROMIDE 0.02 % IN SOLN
RESPIRATORY_TRACT | Status: AC
  Filled 2014-06-27: qty 2.5

## 2014-06-27 MED ORDER — METHYLPREDNISOLONE SODIUM SUCC 40 MG IJ SOLR
40.0000 mg | Freq: Four times a day (QID) | INTRAMUSCULAR | Status: DC
  Administered 2014-06-27: 40 mg via INTRAVENOUS
  Filled 2014-06-27 (×4): qty 1

## 2014-06-27 MED ORDER — ONDANSETRON HCL 4 MG/2ML IJ SOLN
4.0000 mg | Freq: Four times a day (QID) | INTRAMUSCULAR | Status: DC | PRN
  Administered 2014-06-27: 4 mg via INTRAVENOUS
  Filled 2014-06-27: qty 2

## 2014-06-27 MED ORDER — CHLORHEXIDINE GLUCONATE 0.12 % MT SOLN
15.0000 mL | Freq: Two times a day (BID) | OROMUCOSAL | Status: DC
  Administered 2014-06-27 – 2014-06-29 (×4): 15 mL via OROMUCOSAL
  Filled 2014-06-27 (×4): qty 15

## 2014-06-27 MED ORDER — ACETAMINOPHEN 650 MG RE SUPP
650.0000 mg | Freq: Four times a day (QID) | RECTAL | Status: DC | PRN
Start: 2014-06-27 — End: 2014-07-01

## 2014-06-27 MED ORDER — OXYCODONE HCL 5 MG PO TABS
5.0000 mg | ORAL_TABLET | ORAL | Status: DC | PRN
  Administered 2014-06-27 – 2014-07-01 (×18): 5 mg via ORAL
  Filled 2014-06-27 (×18): qty 1

## 2014-06-27 MED ORDER — MAGNESIUM SULFATE 2 GM/50ML IV SOLN
INTRAVENOUS | Status: AC
  Administered 2014-06-27: 2 g
  Filled 2014-06-27: qty 50

## 2014-06-27 MED ORDER — METHYLPREDNISOLONE SODIUM SUCC 40 MG IJ SOLR
40.0000 mg | Freq: Four times a day (QID) | INTRAMUSCULAR | Status: DC
  Administered 2014-06-28 – 2014-06-30 (×10): 40 mg via INTRAVENOUS
  Filled 2014-06-27 (×14): qty 1

## 2014-06-27 MED ORDER — IPRATROPIUM BROMIDE 0.02 % IN SOLN
0.5000 mg | Freq: Once | RESPIRATORY_TRACT | Status: AC
  Administered 2014-06-27: 0.5 mg via RESPIRATORY_TRACT

## 2014-06-27 MED ORDER — LORAZEPAM 2 MG/ML IJ SOLN
1.0000 mg | Freq: Once | INTRAMUSCULAR | Status: AC
  Administered 2014-06-27: 1 mg via INTRAVENOUS
  Filled 2014-06-27: qty 1

## 2014-06-27 MED ORDER — MORPHINE SULFATE 2 MG/ML IJ SOLN
2.0000 mg | INTRAMUSCULAR | Status: DC | PRN
  Administered 2014-06-27: 2 mg via INTRAVENOUS
  Filled 2014-06-27: qty 1

## 2014-06-27 MED ORDER — IPRATROPIUM-ALBUTEROL 0.5-2.5 (3) MG/3ML IN SOLN
3.0000 mL | Freq: Once | RESPIRATORY_TRACT | Status: AC
  Administered 2014-06-27: 3 mL via RESPIRATORY_TRACT
  Filled 2014-06-27: qty 3

## 2014-06-27 MED ORDER — ALBUTEROL SULFATE (2.5 MG/3ML) 0.083% IN NEBU
2.5000 mg | INHALATION_SOLUTION | RESPIRATORY_TRACT | Status: DC | PRN
  Administered 2014-06-28: 2.5 mg via RESPIRATORY_TRACT
  Filled 2014-06-27: qty 3

## 2014-06-27 MED ORDER — LORAZEPAM 2 MG/ML IJ SOLN
1.0000 mg | Freq: Once | INTRAMUSCULAR | Status: AC
  Administered 2014-06-27: 1 mg via INTRAVENOUS

## 2014-06-27 MED ORDER — MORPHINE SULFATE 2 MG/ML IJ SOLN
2.0000 mg | Freq: Once | INTRAMUSCULAR | Status: AC
  Administered 2014-06-27: 2 mg via INTRAVENOUS
  Filled 2014-06-27: qty 1

## 2014-06-27 MED ORDER — METHYLPREDNISOLONE SODIUM SUCC 125 MG IJ SOLR: 80.0000 mg | Freq: Four times a day (QID) | INTRAMUSCULAR | Status: DC

## 2014-06-27 MED ORDER — METHYLPREDNISOLONE SODIUM SUCC 125 MG IJ SOLR: 80.0000 mg | Freq: Two times a day (BID) | INTRAMUSCULAR | Status: DC

## 2014-06-27 MED ORDER — ALBUTEROL SULFATE (2.5 MG/3ML) 0.083% IN NEBU
2.5000 mg | INHALATION_SOLUTION | RESPIRATORY_TRACT | Status: DC
  Administered 2014-06-27: 2.5 mg via RESPIRATORY_TRACT
  Filled 2014-06-27: qty 3

## 2014-06-27 MED ORDER — DEXMEDETOMIDINE HCL IN NACL 400 MCG/100ML IV SOLN
0.2000 ug/kg/h | INTRAVENOUS | Status: AC
  Administered 2014-06-27: 0.2 ug/kg/h via INTRAVENOUS
  Administered 2014-06-27: 0.4 ug/kg/h via INTRAVENOUS
  Administered 2014-06-28 (×5): 0.7 ug/kg/h via INTRAVENOUS
  Filled 2014-06-27 (×3): qty 50
  Filled 2014-06-27: qty 100
  Filled 2014-06-27 (×3): qty 50

## 2014-06-27 MED ORDER — INSULIN ASPART 100 UNIT/ML ~~LOC~~ SOLN
0.0000 [IU] | Freq: Three times a day (TID) | SUBCUTANEOUS | Status: DC
Start: 2014-06-27 — End: 2014-06-30
  Administered 2014-06-27: 4 [IU] via SUBCUTANEOUS
  Administered 2014-06-28 – 2014-06-30 (×6): 3 [IU] via SUBCUTANEOUS

## 2014-06-27 MED ORDER — MORPHINE SULFATE 4 MG/ML IJ SOLN
4.0000 mg | Freq: Once | INTRAMUSCULAR | Status: AC
  Administered 2014-06-27: 4 mg via INTRAVENOUS

## 2014-06-27 MED ORDER — DIAZEPAM 5 MG PO TABS
5.0000 mg | ORAL_TABLET | Freq: Four times a day (QID) | ORAL | Status: DC | PRN
  Administered 2014-06-27 – 2014-07-01 (×12): 5 mg via ORAL
  Filled 2014-06-27 (×13): qty 1

## 2014-06-27 MED ORDER — ALBUTEROL SULFATE (2.5 MG/3ML) 0.083% IN NEBU
2.5000 mg | INHALATION_SOLUTION | Freq: Four times a day (QID) | RESPIRATORY_TRACT | Status: DC
  Administered 2014-06-27 – 2014-06-28 (×2): 2.5 mg via RESPIRATORY_TRACT
  Filled 2014-06-27 (×2): qty 3

## 2014-06-27 MED ORDER — SODIUM CHLORIDE 0.9 % IV SOLN
INTRAVENOUS | Status: AC
  Administered 2014-06-27: 14:00:00 via INTRAVENOUS

## 2014-06-27 MED ORDER — MORPHINE SULFATE 2 MG/ML IJ SOLN
INTRAMUSCULAR | Status: AC
  Filled 2014-06-27: qty 2

## 2014-06-27 MED ORDER — IPRATROPIUM BROMIDE 0.02 % IN SOLN
0.5000 mg | Freq: Four times a day (QID) | RESPIRATORY_TRACT | Status: DC
  Administered 2014-06-27 – 2014-06-28 (×2): 0.5 mg via RESPIRATORY_TRACT
  Filled 2014-06-27 (×2): qty 2.5

## 2014-06-27 MED ORDER — ENOXAPARIN SODIUM 40 MG/0.4ML ~~LOC~~ SOLN
40.0000 mg | SUBCUTANEOUS | Status: DC
  Administered 2014-06-27 – 2014-06-30 (×4): 40 mg via SUBCUTANEOUS
  Filled 2014-06-27 (×4): qty 0.4

## 2014-06-27 MED ORDER — ALBUTEROL (5 MG/ML) CONTINUOUS INHALATION SOLN
10.0000 mg/h | INHALATION_SOLUTION | Freq: Once | RESPIRATORY_TRACT | Status: AC
  Administered 2014-06-27: 20 mg/h via RESPIRATORY_TRACT

## 2014-06-27 MED ORDER — ALBUTEROL SULFATE (2.5 MG/3ML) 0.083% IN NEBU: 2.5000 mg | INHALATION_SOLUTION | RESPIRATORY_TRACT | Status: DC | PRN

## 2014-06-27 MED ORDER — CETYLPYRIDINIUM CHLORIDE 0.05 % MT LIQD
7.0000 mL | Freq: Two times a day (BID) | OROMUCOSAL | Status: DC
  Administered 2014-06-28 – 2014-06-29 (×4): 7 mL via OROMUCOSAL

## 2014-06-27 NOTE — Consult Note (Signed)
PULMONARY / CRITICAL CARE MEDICINE   Name: [name] Carter MRN: [id number] DOB: 1961-05-03    ADMISSION DATE:  06/27/2014 CONSULTATION DATE:  06/27/14  REFERRING MD :  Dr. Barnie Del  CHIEF COMPLAINT:  SOB   INITIAL PRESENTATION: 54 y/o M, smoker, admitted 12/26 with SOB, cough, chest tightness.  Found to have acute respiratory failure with mixed acidosis.  PCCM consulted for evaluation.    STUDIES:  05/15/14  CTA Chest >> neg for PE, bilateral upper lobe predominant GGO  12/26  CXR >> hyper expanded, no overt infiltrate  SIGNIFICANT EVENTS: 12/26  Admit with SOB, cough & chest tightness.    HISTORY OF PRESENT ILLNESS:  53 y/o M, current smoker (hx smoking since age 60, up to 4 ppd in past) who presented to the [facility] ER on 12/25 with shortness of breath and audible wheezing.  Patient reported approximately 24 hours of progressive shortness of breath, cough with minimal sputum production, wheezing and chest tightness.  He had a recent assessment in the ER and was treated at his request with pain medication with resolution of systems and was discharged.  In the [facility] ER, he was noted to have tachycardia into the 140's and decreased saturations into the mid 90's.  Patient was treated and released.    He presented a second time to the [facility] ER on 12/26 with repeated complaints of shortness of breath and symptoms as above.  The patient also reported severe neck and back pain.  He reportedly was told that his neck pain was triggering his back problems.  Patient attempted home use of nebulizer meds without relief of symptoms.  He has no numbness or associated weakness with neck pain.  Reported hx of being intubated 2x in the past. Patient was placed on bipap in the ER for increased work of breathing.  Patient is very difficult to obtain straight answers regarding health as he is very focused on neck / back pain and is circular in conversation.  PCCM consulted for evaluation.     PAST MEDICAL HISTORY :   has a past medical history of COPD (chronic obstructive pulmonary disease); Pneumonia; Chronic back pain; DDD (degenerative disc disease), lumbar; DDD (degenerative disc disease), cervical; and Neck pain.  has past surgical history that includes Back surgery; Bilateral carpal tunnel release; Ulnar nerve transposition; and bilteral knee surgery.   HOME MEDICATIONS:  Prior to Admission medications   Medication Sig Start Date End Date Taking? Authorizing Provider  acetaminophen-codeine (TYLENOL #3) 300-30 MG per tablet Take 1-2 tablets by mouth every 6 (six) hours as needed. Patient taking differently: Take 1-2 tablets by mouth every 6 (six) hours as needed for moderate pain.  06/22/14  Yes Kathie Dike, PA-C  albuterol (PROVENTIL HFA;VENTOLIN HFA) 108 (90 BASE) MCG/ACT inhaler Inhale 2 puffs into the lungs every 4 (four) hours as needed for wheezing or shortness of breath. 06/14/14  Yes Standley Brooking, MD  albuterol (PROVENTIL) (2.5 MG/3ML) 0.083% nebulizer solution Take 3 mLs (2.5 mg total) by nebulization every 4 (four) hours as needed for wheezing or shortness of breath. 05/16/14  Yes Standley Brooking, MD  diazepam (VALIUM) 5 MG tablet Take 1 tablet (5 mg total) by mouth every 6 (six) hours as needed for muscle spasms. 06/26/14  Yes Juliet Rude. Pickering, MD  Ipratropium-Albuterol (COMBIVENT) 20-100 MCG/ACT AERS respimat Inhale 1 puff into the lungs 4 (four) times daily. 06/14/14  Yes Standley Brooking, MD  promethazine (PHENERGAN) 12.5 MG tablet  1 po q6h prn nausea Patient taking differently: Take 12.5 mg by mouth every 6 (six) hours as needed for nausea. 1 po q6h prn nausea 06/22/14  Yes Kathie Dike, PA-C  doxycycline (VIBRA-TABS) 100 MG tablet Take 1 tablet (100 mg total) by mouth every 12 (twelve) hours. Start 12/14. Patient not taking: Reported on 06/22/2014 06/15/14   Standley Brooking, MD  oxyCODONE-acetaminophen (PERCOCET) 5-325 MG per tablet Take 1  tablet by mouth every 6 (six) hours as needed for severe pain. Patient not taking: Reported on 06/22/2014 06/14/14   Standley Brooking, MD  predniSONE (DELTASONE) 10 MG tablet Start 12/14 . Take 40 mg by mouth daily for 3 days, then take 20 mg by mouth daily for 3 days, then take 10 mg by mouth daily for 3 days, then stop. Patient not taking: Reported on 06/22/2014 06/14/14   Standley Brooking, MD  predniSONE (DELTASONE) 20 MG tablet Take 3 tablets (60 mg total) by mouth daily. Patient not taking: Reported on 06/27/2014 06/26/14   Juliet Rude. Rubin Payor, MD  Spacer/Aero-Holding Chambers (AEROCHAMBER PLUS WITH MASK) inhaler Use as instructed Patient not taking: Reported on 06/22/2014 05/05/14   Doug Sou, MD   Allergies  Allergen Reactions  . Vicodin [Hydrocodone-Acetaminophen] Nausea And Vomiting and Other (See Comments)    Made patient feel funny, stomach pain    FAMILY HISTORY:  has no family status information on file.    SOCIAL HISTORY:  reports that he has been smoking Cigarettes.  He has been smoking about 1.00 pack per day. He does not have any smokeless tobacco history on file. He reports that he does not drink alcohol or use illicit drugs.  REVIEW OF SYSTEMS:  Unable to review as patient is requiring bipap.   SUBJECTIVE:   VITAL SIGNS: Temp:  [98.7 F (37.1 C)] 98.7 F (37.1 C) (12/26 0316) Pulse Rate:  [72-136] 105 (12/26 1100) Resp:  [14-48] 14 (12/26 0715) BP: (116-191)/(62-111) 133/74 mmHg (12/26 0715) SpO2:  [91 %-100 %] 98 % (12/26 1227) FiO2 (%):  [36 %-50 %] 50 % (12/26 1227)   HEMODYNAMICS:     VENTILATOR SETTINGS: Vent Mode:  [-]  FiO2 (%):  [36 %-50 %] 50 %   INTAKE / OUTPUT:  Intake/Output Summary (Last 24 hours) at 06/27/14 1240 Last data filed at 06/27/14 0353  Gross per 24 hour  Intake     50 ml  Output      0 ml  Net     50 ml    PHYSICAL EXAMINATION: General:  Chronically ill adult male in NAD Neuro:  AAOx4, speech clear, MAE HEENT:   Bipap mask in place, mm pink/moist Cardiovascular:  s1s2 rrr, no m/r/g  Lungs:  Mild tachypnea, lungs bilaterally coarse with wheezing / scattered rhonchi Abdomen:  Obese/soft, bsx4 active  Musculoskeletal:  No acute deformities, good grips, equal strength  Skin:  Ruddy, warm/dry  LABS:  CBC  Recent Labs Lab 06/26/14 1535 06/27/14 0318  WBC 8.4 11.8*  HGB 14.2 13.9  HCT 42.0 41.9  PLT 212 220   Coag's No results for input(s): APTT, INR in the last 168 hours.   BMET  Recent Labs Lab 06/26/14 1535 06/27/14 0318  NA 138 141  K 3.9 4.9  CL 107 107  CO2 26 23  BUN 10 13  CREATININE 0.94 1.55*  GLUCOSE 109* 265*   Electrolytes  Recent Labs Lab 06/26/14 1535 06/27/14 0318  CALCIUM 9.0 9.1   Sepsis Markers No results  for input(s): LATICACIDVEN, PROCALCITON, O2SATVEN in the last 168 hours.   ABG  Recent Labs Lab 06/27/14 0351 06/27/14 0904 06/27/14 1235  PHART 7.265* 7.246* 7.270*  PCO2ART 43.8 39.2 42.4  PO2ART 101.0* 88.0 211.0*   Liver Enzymes No results for input(s): AST, ALT, ALKPHOS, BILITOT, ALBUMIN in the last 168 hours.   Cardiac Enzymes  Recent Labs Lab 06/26/14 1535  TROPONINI <0.03   Glucose No results for input(s): GLUCAP in the last 168 hours.  Imaging Dg Chest 1 View  06/26/2014   CLINICAL DATA:  Severe shortness of breath, chest pain, history COPD, pneumonia, smoking  EXAM: CHEST - 1 VIEW  COMPARISON:  Portable exam 1527 hr compared to 06/13/2014  FINDINGS: Normal heart size, mediastinal contours and pulmonary vascularity.  Lungs clear.  No pleural effusion or pneumothorax.  Osseous structures unremarkable.  IMPRESSION: No acute abnormalities.   Electronically Signed   By: Ulyses SouthwardMark  Boles M.D.   On: 06/26/2014 15:45     ASSESSMENT / PLAN:  PULMONARY OETT A: Acute Respiratory Failure - in setting of COPD exacerbation  AECOPD  Bilateral Upper Lobe GGO - raises question of inhaled substance abuse.  Prior ANA, RF , HIV & ANCA  neg Hx of Intubation x 2 P:   Scheduled albuterol / atrovent + Q3 PRN albuterol BiPAP PRN for increased work of breathing Adjust steroids to 40 mg Q6, rapid reduction  Assess UDS Tx to ICU for observation   CARDIOVASCULAR CVL A:  Tachycardia - mild, in setting of exacerbation of COPD P:  Gentle IVF, NS @ 75 ml/hr  RENAL A:   Acute Kidney Injury Mixed AG Metabolic Acidosis - unclear etiology, AKI + component of lactic acid? P:   Assess lactic acid, ? If increased respiratory effort contributing to lactic acidosis  Trend BMP  Replace electrolytes as indicated  Check salicylate level   GASTROINTESTINAL A:   No Acute Issues  P:   NPO x meds, sips / chips given BiPAP need  Diet as tolerated once respiratory status stable off bipap  HEMATOLOGIC A:   No acute issues  P:  Monitor CBC   INFECTIOUS A:  AECOPD  P:   Monitor off abx in the absence of fever / leukocytosis  Trend WBC / fever   ENDOCRINE A:   Steroid Induced Hyperglycemia  P:   SSI while on steroids   NEUROLOGIC A:   Chronic Pain  ? Suspected Drug Seeking Behavior - s/p 16 mg morphine in ER, 2mg  ativan P:   RASS goal: n/a PRN percocet for pain  PRN valium for spasms  Precedex gtt  No further IV morphine out of concern for respiratory status  FAMILY  - Updates: no family at bedside   Canary BrimBrandi Ollis, NP-C [place] Pgr: [phone] or [phone]   06/27/2014, 12:40 PM   Reviewed above, examined.  53 yo male with progressive dyspnea.  He has extensive hx of smoking.  [facility] with respiratory failure and b/l upper lobe predominant GGO.  He has hx of chronic pain on chronic opiates.  He denies illicit drug use, but not sure how reliable this information is.  He has combined respiratory and metabolic acidosis on ABG.  He has persistent wheezing and rhonchi.  Will need to continue on BiPAP >> explained that he might need intubation if he does not  improve.  Will continue Abx, solumedrol, scheduled BDs.  Check UDS, lactic acid, salicylate level.  Try  to minimize opiates >> ? If some of his distress related to opiate withdrawal.  Will use precedex while on BiPAP.  CC time by me independent of APP time is 40 minutes.  Coralyn HellingVineet Temple Sporer, MD [facility] 06/27/2014, 2:18 PM Pager:  [phone] After 3pm call: [phone]

## 2014-06-27 NOTE — ED Notes (Signed)
Wife notified of bipap placement.

## 2014-06-27 NOTE — ED Notes (Signed)
Pt requesting pain meds and valium-- told pt had Oxydone IR 5mg  ordered, pt states takes 10-15mg  every 3-4 hours at home. States would rather have the "shot" of morphine. Encouraged to start with oxycodone and valium and if that doesn't work, then try Morphine.

## 2014-06-27 NOTE — ED Notes (Signed)
Spoke with wife on the phone-- aware of admission to ICU

## 2014-06-27 NOTE — Progress Notes (Signed)
Unit CM UR Completed by [facility] ED CM  W. Bernhard Koskinen RN  

## 2014-06-27 NOTE — ED Provider Notes (Signed)
CSN: [id number]     Arrival date & time 06/27/14  0302 History   First MD Initiated Contact with Patient 06/27/14 0315     Chief Complaint  Patient presents with  . Respiratory Distress     (Consider location/radiation/quality/duration/timing/severity/associated sxs/prior Treatment) The history is provided by the patient. The history is limited by the condition of the patient (Severe respiratory distress).   53 year old male with history of COPD has been having difficulty breathing throughout the day. He actually had been in the emergency department at [facility] and treated and released. He has had a cough productive of some clear sputum. He denies fever, chills, sweats. He states that he is been having some difficulty with his breathing for the last several days but it had been mild. He has severe neck pain and he has been told that his neck pain is triggering his breathing problems. He states that he had been using his home nebulizer but it just wasn't helping. Is complaining of severe pain in his neck which he rates at 10/10. There is no numbness certainly or weakness. He has been told that he needs surgery on his neck and is scheduled to see the surgeon in the next several weeks. Of note, he has been intubated twice and has been on BiPAP in the past. He states that if he needs respiratory support, he would prefer to be intubated then to be on BiPAP.  Past Medical History  Diagnosis Date  . COPD (chronic obstructive pulmonary disease)   . Pneumonia   . Chronic back pain   . DDD (degenerative disc disease), lumbar   . DDD (degenerative disc disease), cervical   . Neck pain    Past Surgical History  Procedure Laterality Date  . Back surgery      low back x4  . Bilateral carpal tunnel release    . Ulnar nerve transposition      ? by description, not clear  . Bilteral knee surgery     Family History  Problem Relation Age of Onset  . Heart attack Father    History   Substance Use Topics  . Smoking status: Heavy Tobacco Smoker -- 1.00 packs/day    Types: Cigarettes  . Smokeless tobacco: Not on file  . Alcohol Use: No    Review of Systems  Unable to perform ROS: Severe respiratory distress      Allergies  Vicodin  Home Medications   Prior to Admission medications   Medication Sig Start Date End Date Taking? Authorizing Provider  acetaminophen-codeine (TYLENOL #3) 300-30 MG per tablet Take 1-2 tablets by mouth every 6 (six) hours as needed. Patient taking differently: Take 1-2 tablets by mouth every 6 (six) hours as needed for moderate pain.  06/22/14   Kathie Dike, PA-C  albuterol (PROVENTIL HFA;VENTOLIN HFA) 108 (90 BASE) MCG/ACT inhaler Inhale 2 puffs into the lungs every 4 (four) hours as needed for wheezing or shortness of breath. 06/14/14   Standley Brooking, MD  albuterol (PROVENTIL) (2.5 MG/3ML) 0.083% nebulizer solution Take 3 mLs (2.5 mg total) by nebulization every 4 (four) hours as needed for wheezing or shortness of breath. 05/16/14   Standley Brooking, MD  diazepam (VALIUM) 5 MG tablet Take 1 tablet (5 mg total) by mouth every 6 (six) hours as needed for muscle spasms. 06/26/14   Juliet Rude. Pickering, MD  doxycycline (VIBRA-TABS) 100 MG tablet Take 1 tablet (100 mg total) by mouth every 12 (twelve) hours. Start 12/14.  Patient not taking: Reported on 06/22/2014 06/15/14   Standley Brooking, MD  Ipratropium-Albuterol (COMBIVENT) 20-100 MCG/ACT AERS respimat Inhale 1 puff into the lungs 4 (four) times daily. 06/14/14   Standley Brooking, MD  oxyCODONE-acetaminophen (PERCOCET) 5-325 MG per tablet Take 1 tablet by mouth every 6 (six) hours as needed for severe pain. Patient not taking: Reported on 06/22/2014 06/14/14   Standley Brooking, MD  predniSONE (DELTASONE) 10 MG tablet Start 12/14 . Take 40 mg by mouth daily for 3 days, then take 20 mg by mouth daily for 3 days, then take 10 mg by mouth daily for 3 days, then stop. Patient not  taking: Reported on 06/22/2014 06/14/14   Standley Brooking, MD  predniSONE (DELTASONE) 20 MG tablet Take 3 tablets (60 mg total) by mouth daily. 06/26/14   Juliet Rude. Rubin Payor, MD  promethazine (PHENERGAN) 12.5 MG tablet 1 po q6h prn nausea Patient taking differently: Take 12.5 mg by mouth every 6 (six) hours as needed for nausea. 1 po q6h prn nausea 06/22/14   Kathie Dike, PA-C  Spacer/Aero-Holding Chambers (AEROCHAMBER PLUS WITH MASK) inhaler Use as instructed Patient not taking: Reported on 06/22/2014 05/05/14   Doug Sou, MD   BP 161/98 mmHg  Pulse 129  Temp(Src) 98.7 F (37.1 C) (Oral)  Resp 24  SpO2 97% Physical Exam  Nursing note and vitals reviewed.  53 year old male, in severe respiratory distress. Vital signs are significant for tachycardia and tachypnea as well as hypertension. Oxygen saturation is 97%, which is normal. Head is normocephalic and atraumatic. PERRLA, EOMI. Oropharynx is clear. Neck is nontender and supple without adenopathy or JVD. Back is nontender and there is no CVA tenderness. Lungs have diffuse inspiratory and expiratory wheezes. He is using accessory muscles of respiration. Chest is nontender. Heart has regular rate and rhythm without murmur. Abdomen is soft, flat, nontender without masses or hepatosplenomegaly and peristalsis is normoactive. Extremities have no cyanosis or edema, full range of motion is present. Skin is warm and dry without rash. Neurologic: Mental status is significant for marked anxiety, cranial nerves are intact, there are no motor or sensory deficits.  ED Course  Procedures (including critical care time) Labs Review Results for orders placed or performed during the hospital encounter of 06/27/14  Basic metabolic panel  Result Value Ref Range   Sodium 141 135 - 145 mmol/L   Potassium 4.9 3.5 - 5.1 mmol/L   Chloride 107 96 - 112 mEq/L   CO2 23 19 - 32 mmol/L   Glucose, Bld 265 (H) 70 - 99 mg/dL   BUN 13 6 - 23 mg/dL    Creatinine, Ser 1.61 (H) 0.50 - 1.35 mg/dL   Calcium 9.1 8.4 - 09.6 mg/dL   GFR calc non Af Amer 49 (L) >90 mL/min   GFR calc Af Amer 57 (L) >90 mL/min   Anion gap 11 5 - 15  CBC with Differential  Result Value Ref Range   WBC 11.8 (H) 4.0 - 10.5 K/uL   RBC 4.65 4.22 - 5.81 MIL/uL   Hemoglobin 13.9 13.0 - 17.0 g/dL   HCT 04.5 40.9 - 81.1 %   MCV 90.1 78.0 - 100.0 fL   MCH 29.9 26.0 - 34.0 pg   MCHC 33.2 30.0 - 36.0 g/dL   RDW 91.4 78.2 - 95.6 %   Platelets 220 150 - 400 K/uL   Neutrophils Relative % 90 (H) 43 - 77 %   Neutro Abs 10.6 (H) 1.7 -  7.7 K/uL   Lymphocytes Relative 9 (L) 12 - 46 %   Lymphs Abs 1.0 0.7 - 4.0 K/uL   Monocytes Relative 1 (L) 3 - 12 %   Monocytes Absolute 0.1 0.1 - 1.0 K/uL   Eosinophils Relative 0 0 - 5 %   Eosinophils Absolute 0.0 0.0 - 0.7 K/uL   Basophils Relative 0 0 - 1 %   Basophils Absolute 0.0 0.0 - 0.1 K/uL  I-Stat Arterial Blood Gas, ED - (order at [facility] and [facility] only)  Result Value Ref Range   pH, Arterial 7.265 (L) 7.350 - 7.450   pCO2 arterial 43.8 35.0 - 45.0 mmHg   pO2, Arterial 101.0 (H) 80.0 - 100.0 mmHg   Bicarbonate 19.9 (L) 20.0 - 24.0 mEq/L   TCO2 21 0 - 100 mmol/L   O2 Saturation 97.0 %   Acid-base deficit 7.0 (H) 0.0 - 2.0 mmol/L   Patient temperature 98.6 F    Collection site RADIAL, ALLEN'S TEST ACCEPTABLE    Drawn by RT    Sample type ARTERIAL     Imaging Review Dg Chest 1 View  06/26/2014   CLINICAL DATA:  Severe shortness of breath, chest pain, history COPD, pneumonia, smoking  EXAM: CHEST - 1 VIEW  COMPARISON:  Portable exam 1527 hr compared to 06/13/2014  FINDINGS: Normal heart size, mediastinal contours and pulmonary vascularity.  Lungs clear.  No pleural effusion or pneumothorax.  Osseous structures unremarkable.  IMPRESSION: No acute abnormalities.   Electronically Signed   By: Ulyses SouthwardMark  Boles M.D.   On: 06/26/2014 15:45     EKG Interpretation   Date/Time:  Saturday June 27 2014 03:07:18 EST Ventricular Rate:   138 PR Interval:    QRS Duration: 88 QT Interval:  287 QTC Calculation: 435 R Axis:   -75 Text Interpretation:  Sinus tachycardia Inferior infarct, old Consider  anterior infarct Artifact in lead(s) II III aVF V3 V4 and baseline wander  in lead(s) II III aVF V5 When compared with ECG of 06/26/2014, No  significant change was found Confirmed by [facility]  MD, Edia Pursifull ([id number]) on  06/27/2014 7:24:29 AM      CRITICAL CARE Performed by: OZHYQ,MVHQIGLICK,Ziquan Fidel Total critical care time: 130 minutes Critical care time was exclusive of separately billable procedures and treating other patients. Critical care was necessary to treat or prevent imminent or life-threatening deterioration. Critical care was time spent personally by me on the following activities: development of treatment plan with patient and/or surrogate as well as nursing, discussions with consultants, evaluation of patient's response to treatment, examination of patient, obtaining history from patient or surrogate, ordering and performing treatments and interventions, ordering and review of laboratory studies, ordering and review of radiographic studies, pulse oximetry and re-evaluation of patient's condition.  MDM   Final diagnoses:  Wheezing  COPD exacerbation  Neck pain    COPD exacerbation with respiratory distress. Old records reviewed and he had been [facility] and treated with high dose of methylprednisolone and had been discharged in good condition. It is very concerning that the patient had this severe of a exacerbation in spite of the the dose of steroids. He is treated aggressively in the ED with a continuous nebulizer and also given intravenous magnesium as well as additional methylprednisolone. He also seemed very anxious and was in pain from his neck. There is concern that his neck pain may benefit trigger for his respiratory decompensation. He was given morphine for pain and  given a dose  of lorazepam. Following all these treatments, there is significant improvement. He continued to be wheezing and continued to have slight use of accessory muscles, but is able to talk in full sentences. Is given additional albuterol with ipratropium and is now resting reasonably comfortably. However, given his recent decompensation in spite of steroids, it is felt that he needs to be admitted for observation. Case has been discussed with Dr. Lovell SheehanJenkins of [facility] agrees to admit the patient.   Dione Boozeavid Benji Poynter, MD 06/27/14 [phone]

## 2014-06-27 NOTE — ED Notes (Signed)
Intensivist at bedside.

## 2014-06-27 NOTE — ED Notes (Signed)
bipap restarted per RT per V.O. Dr. Celso SickleSuud.

## 2014-06-27 NOTE — Progress Notes (Signed)
eLink Physician-Brief Progress Note Patient Name: [name] Carter DOB: 02-13-1961 MRN: [id number]   Date of Service  06/27/2014  HPI/Events of Note  Having breakthru pain on oxycodone 5 mg q 4  eICU Interventions  Add fentanyl 25 mcg q 1 h prn      Intervention Category Minor Interventions: Routine modifications to care plan (e.g. PRN medications for pain, fever)  Sandrea HughsMichael Nychelle Cassata 06/27/2014, 10:29 PM

## 2014-06-27 NOTE — ED Notes (Signed)
Patient presents to ED with respiratory distress. Audible expiratory wheezing. Hx of intubation for same. HR 140's o2 sats mid 90's

## 2014-06-27 NOTE — Procedures (Signed)
Pt transported from ED to 2south bed 6 on bipap without complications.

## 2014-06-27 NOTE — H&P (Addendum)
[facility] History and Physical  Wadsworth Skolnick XHF:[id number] DOB: [birth year] DOA: 06/27/2014   PCP: [place]  Specialists: None  Chief Complaint: Shortness of breath  HPI: [name] Carter is a 53 y.o. male without has medical history of COPD, chronic neck pain, chronic back pain who was in his usual state of health until yesterday when he started developing shortness of breath. He went to the emergency department at [facility]. But at that time, he was mainly complaining of his neck pain. He was treated symptomatically and was discharged home. However, his breathing got worse and he decided to come to [place]. He complains of chest pain, especially with coughing. Has a cough with the thick whitish expectoration. Denies any blood in the sputum. No nausea, vomiting. No fever or chills. No dizziness or lightheadedness. He is very anxious. He has received breathing treatments in the emergency department, and is feeling slightly better. It is difficult to obtain history from him as he kept talking about his neck pain rather than his breathing difficulty. He apparently is to be seen by orthopedic or neurosurgeon in the near future regarding his neck issues.  Home Medications: Prior to Admission medications   Medication Sig Start Date End Date Taking? Authorizing Provider  acetaminophen-codeine (TYLENOL #3) 300-30 MG per tablet Take 1-2 tablets by mouth every 6 (six) hours as needed. Patient taking differently: Take 1-2 tablets by mouth every 6 (six) hours as needed for moderate pain.  06/22/14  Yes Lenox Ahr, PA-C  albuterol (PROVENTIL HFA;VENTOLIN HFA) 108 (90 BASE) MCG/ACT inhaler Inhale 2 puffs into the lungs every 4 (four) hours as needed for wheezing or shortness of breath. 06/14/14  Yes Samuella Cota, MD  albuterol (PROVENTIL) (2.5 MG/3ML) 0.083% nebulizer solution Take 3 mLs (2.5 mg total) by nebulization every 4 (four) hours as needed for  wheezing or shortness of breath. 05/16/14  Yes Samuella Cota, MD  diazepam (VALIUM) 5 MG tablet Take 1 tablet (5 mg total) by mouth every 6 (six) hours as needed for muscle spasms. 06/26/14  Yes Jasper Riling. Pickering, MD  Ipratropium-Albuterol (COMBIVENT) 20-100 MCG/ACT AERS respimat Inhale 1 puff into the lungs 4 (four) times daily. 06/14/14  Yes Samuella Cota, MD  promethazine (PHENERGAN) 12.5 MG tablet 1 po q6h prn nausea Patient taking differently: Take 12.5 mg by mouth every 6 (six) hours as needed for nausea. 1 po q6h prn nausea 06/22/14  Yes Lenox Ahr, PA-C  doxycycline (VIBRA-TABS) 100 MG tablet Take 1 tablet (100 mg total) by mouth every 12 (twelve) hours. Start 12/14. Patient not taking: Reported on 06/22/2014 06/15/14   Samuella Cota, MD  oxyCODONE-acetaminophen (PERCOCET) 5-325 MG per tablet Take 1 tablet by mouth every 6 (six) hours as needed for severe pain. Patient not taking: Reported on 06/22/2014 06/14/14   Samuella Cota, MD  predniSONE (DELTASONE) 10 MG tablet Start 12/14 . Take 40 mg by mouth daily for 3 days, then take 20 mg by mouth daily for 3 days, then take 10 mg by mouth daily for 3 days, then stop. Patient not taking: Reported on 06/22/2014 06/14/14   Samuella Cota, MD  predniSONE (DELTASONE) 20 MG tablet Take 3 tablets (60 mg total) by mouth daily. Patient not taking: Reported on 06/27/2014 06/26/14   Jasper Riling. Alvino Chapel, MD  Spacer/Aero-Holding Chambers (AEROCHAMBER PLUS WITH MASK) inhaler Use as instructed Patient not taking: Reported on 06/22/2014 05/05/14   Orlie Dakin, MD  Allergies:  Allergies  Allergen Reactions  . Vicodin [Hydrocodone-Acetaminophen] Nausea And Vomiting and Other (See Comments)    Made patient feel funny, stomach pain    Past Medical History: Past Medical History  Diagnosis Date  . COPD (chronic obstructive pulmonary disease)   . Pneumonia   . Chronic back pain   . DDD (degenerative disc disease), lumbar     . DDD (degenerative disc disease), cervical   . Neck pain     Past Surgical History  Procedure Laterality Date  . Back surgery      low back x4  . Bilateral carpal tunnel release    . Ulnar nerve transposition      ? by description, not clear  . Bilteral knee surgery      Social History: He lives in [place]. Smokes one pack of cigarettes on a daily basis. Has a 40-pack-year history of smoking. No alcohol use. No illicit drug use. Currently unemployed and on disability. He uses a walker to ambulate.  Family History:  Family History  Problem Relation Age of Onset  . Heart attack Father      Review of Systems - difficult to obtain due to high level of anxiety and respiratory failure  Physical Examination  Filed Vitals:   06/27/14 0630 06/27/14 0646 06/27/14 0700 06/27/14 0715  BP: 134/73  129/74 133/74  Pulse: 107  105 99  Temp:      TempSrc:      Resp: $Remo'15  17 14  'KTetI$ SpO2: 95% 96% 96% 95%    BP 133/74 mmHg  Pulse 99  Temp(Src) 98.7 F (37.1 C) (Oral)  Resp 14  SpO2 95%  General appearance: alert, very anxious. Cooperative. In no distress. Head: Normocephalic, without obvious abnormality, atraumatic Eyes: conjunctivae/corneas clear. PERRL, EOM's intact.  Throat: lips, mucosa, and tongue normal; teeth and gums normal Neck: no adenopathy, no carotid bruit, no JVD, supple, symmetrical, trachea midline and thyroid not enlarged, symmetric, no tenderness/mass/nodules. Good range of motion. Back: symmetric, no curvature. ROM normal. No CVA tenderness. Resp: Diffuse end expiratory wheezing bilaterally. Few crackles at the bases. No definite rhonchi. Cardio: regular rate and rhythm, S1, S2 normal, no murmur, click, rub or gallop GI: soft, non-tender; bowel sounds normal; no masses,  no organomegaly Extremities: extremities normal, atraumatic, no cyanosis or edema Pulses: 2+ and symmetric Skin: Skin color, texture, turgor normal. No rashes or lesions Neurologic: Alert and  oriented 3. Cranial nerves II-12 intact. Motor strength equal bilateral upper and lower extremities.  Laboratory Data: Results for orders placed or performed during the hospital encounter of 06/27/14 (from the past 48 hour(s))  Basic metabolic panel     Status: Abnormal   Collection Time: 06/27/14  3:18 AM  Result Value Ref Range   Sodium 141 135 - 145 mmol/L    Comment: Please note change in reference range.   Potassium 4.9 3.5 - 5.1 mmol/L    Comment: Please note change in reference range. DELTA CHECK NOTED    Chloride 107 96 - 112 mEq/L   CO2 23 19 - 32 mmol/L   Glucose, Bld 265 (H) 70 - 99 mg/dL   BUN 13 6 - 23 mg/dL   Creatinine, Ser 1.55 (H) 0.50 - 1.35 mg/dL   Calcium 9.1 8.4 - 10.5 mg/dL   GFR calc non Af Amer 49 (L) >90 mL/min   GFR calc Af Amer 57 (L) >90 mL/min    Comment: (NOTE) The eGFR has been calculated using the CKD EPI equation.  This calculation has not been validated in all clinical situations. eGFR's persistently <90 mL/min signify possible Chronic Kidney Disease.    Anion gap 11 5 - 15  CBC with Differential     Status: Abnormal   Collection Time: 06/27/14  3:18 AM  Result Value Ref Range   WBC 11.8 (H) 4.0 - 10.5 K/uL   RBC 4.65 4.22 - 5.81 MIL/uL   Hemoglobin 13.9 13.0 - 17.0 g/dL   HCT 41.9 39.0 - 52.0 %   MCV 90.1 78.0 - 100.0 fL   MCH 29.9 26.0 - 34.0 pg   MCHC 33.2 30.0 - 36.0 g/dL   RDW 12.7 11.5 - 15.5 %   Platelets 220 150 - 400 K/uL   Neutrophils Relative % 90 (H) 43 - 77 %   Neutro Abs 10.6 (H) 1.7 - 7.7 K/uL   Lymphocytes Relative 9 (L) 12 - 46 %   Lymphs Abs 1.0 0.7 - 4.0 K/uL   Monocytes Relative 1 (L) 3 - 12 %   Monocytes Absolute 0.1 0.1 - 1.0 K/uL   Eosinophils Relative 0 0 - 5 %   Eosinophils Absolute 0.0 0.0 - 0.7 K/uL   Basophils Relative 0 0 - 1 %   Basophils Absolute 0.0 0.0 - 0.1 K/uL  I-Stat Arterial Blood Gas, ED - (order at [facility] and [facility] only)     Status: Abnormal   Collection Time: 06/27/14  3:51 AM  Result Value Ref  Range   pH, Arterial 7.265 (L) 7.350 - 7.450   pCO2 arterial 43.8 35.0 - 45.0 mmHg   pO2, Arterial 101.0 (H) 80.0 - 100.0 mmHg   Bicarbonate 19.9 (L) 20.0 - 24.0 mEq/L   TCO2 21 0 - 100 mmol/L   O2 Saturation 97.0 %   Acid-base deficit 7.0 (H) 0.0 - 2.0 mmol/L   Patient temperature 98.6 F    Collection site RADIAL, ALLEN'S TEST ACCEPTABLE    Drawn by RT    Sample type ARTERIAL     Radiology Reports: Dg Chest 1 View  06/26/2014   CLINICAL DATA:  Severe shortness of breath, chest pain, history COPD, pneumonia, smoking  EXAM: CHEST - 1 VIEW  COMPARISON:  Portable exam 1527 hr compared to 06/13/2014  FINDINGS: Normal heart size, mediastinal contours and pulmonary vascularity.  Lungs clear.  No pleural effusion or pneumothorax.  Osseous structures unremarkable.  IMPRESSION: No acute abnormalities.   Electronically Signed   By: Lavonia Dana M.D.   On: 06/26/2014 15:45   Dg Chest Port 1 View  06/27/2014   CLINICAL DATA:  Acute onset of respiratory distress. Expiratory wheezing. Initial encounter.  EXAM: PORTABLE CHEST - 1 VIEW  COMPARISON:  Chest radiograph performed 06/26/2014  FINDINGS: The lungs are mildly hypoexpanded. Vascular crowding and vascular congestion are noted. Increased interstitial markings may reflect mild interstitial edema.  The cardiomediastinal silhouette is borderline normal in size. No acute osseous abnormalities are seen.  IMPRESSION: Lungs mildly hypoexpanded. Vascular congestion noted, with question of minimal interstitial edema.   Electronically Signed   By: Garald Balding M.D.   On: 06/27/2014 03:52    Electrocardiogram: sinus tachycardia at 126bpm, left axis deviation. Non specific t wave changes. No concerning ST changes.  Problem List  Principal Problem:   COPD with acute exacerbation Active Problems:   Acute respiratory failure   Tobacco dependence   Chronic pain   Assessment: This is a 53 year old Caucasian male who presents with progressive dyspnea. He he  appears to have acute COPD  exacerbation. He has neck pain which is chronic. He is very anxious. He has acute respiratory failure.  Plan: #1 Acute respiratory failure with mixed acidosis: ABG was repeated, which continued to show acidosis, mixed?. BiPAP has been ordered. He'll be monitored in the stepdown unit. If he doesn't improve he may have to be intubated. We will assess him closely and notify pulmonary and critical care medicine as needed.  #2 acute COPD exacerbation: Initiate treatment with steroids, antibiotics, nebulizer treatments. He was strongly advised to stop smoking.  #3 Chronic pain syndrome including chronic neck and back pain. CT scan of cervical spine was done on November 28 and the report was reviewed. He was noted to have degenerative changes. Continue pain medications as needed. No neurological deficits are present at this time.  #4 Tobacco abuse: Nicotine patch will be prescribed.  #5 Elevated creatinine: Gentle hydration and repeat blood work in the morning.  ADDENDUM Patient very anxious and not cooperating with bipap. He was given Ativan and Morphine. Will get PCCM to see.   Discussed with Dr. Halford Chessman after he evaluated patient. He will take patient to ICU. [facility] will sign off and will be available as needed. Abanoub Hanken 2:08 PM  DVT Prophylaxis: Lovenox Code Status: Full code Family Communication: Discussed with the patient and his daughter-in-law  Disposition Plan: Admit to stepdown   Further management decisions will depend on results of further testing and patient's response to treatment.   [facility]  [facility] Pager [phone]  If 7PM-7AM, please contact night-coverage [email or web address] Password [facility]  06/27/2014, 7:47 AM

## 2014-06-27 NOTE — ED Notes (Signed)
MD Glick at bedside. 

## 2014-06-27 NOTE — ED Notes (Signed)
bipap removed per intensivist.

## 2014-06-27 NOTE — ED Notes (Signed)
Pt continuously talking with bipap on-- encouraged to not talk, pt states  He would rather be intubated and be asleep than have this mask on. Wants wife notified of bipap.

## 2014-06-28 LAB — COMPREHENSIVE METABOLIC PANEL
ALK PHOS: 49 U/L (ref 39–117)
ALT: 19 U/L (ref 0–53)
ANION GAP: 7 (ref 5–15)
AST: 16 U/L (ref 0–37)
Albumin: 3.4 g/dL — ABNORMAL LOW (ref 3.5–5.2)
BILIRUBIN TOTAL: 0.5 mg/dL (ref 0.3–1.2)
BUN: 16 mg/dL (ref 6–23)
CHLORIDE: 109 meq/L (ref 96–112)
CO2: 23 mmol/L (ref 19–32)
Calcium: 8.6 mg/dL (ref 8.4–10.5)
Creatinine, Ser: 0.91 mg/dL (ref 0.50–1.35)
GFR calc Af Amer: >90 mL/min (ref >90)
GFR calc non Af Amer: >90 mL/min (ref >90)
Glucose, Bld: 141 mg/dL — ABNORMAL HIGH (ref 70–99)
POTASSIUM: 5.5 mmol/L — AB (ref 3.5–5.1)
Sodium: 139 mmol/L (ref 135–145)
Total Protein: 5.8 g/dL — ABNORMAL LOW (ref 6.0–8.3)

## 2014-06-28 LAB — CBC
HEMATOCRIT: 38.4 % — AB (ref 39.0–52.0)
Hemoglobin: 12.4 g/dL — ABNORMAL LOW (ref 13.0–17.0)
MCH: 29.4 pg (ref 26.0–34.0)
MCHC: 32.3 g/dL (ref 30.0–36.0)
MCV: 91 fL (ref 78.0–100.0)
PLATELETS: 185 10*3/uL (ref 150–400)
RBC: 4.22 MIL/uL (ref 4.22–5.81)
RDW: 13.3 % (ref 11.5–15.5)
WBC: 16.9 10*3/uL — ABNORMAL HIGH (ref 4.0–10.5)

## 2014-06-28 LAB — RAPID URINE DRUG SCREEN, HOSP PERFORMED
AMPHETAMINES: NOT DETECTED
BENZODIAZEPINES: POSITIVE — AB
Barbiturates: NOT DETECTED
COCAINE: NOT DETECTED
Opiates: POSITIVE — AB
Tetrahydrocannabinol: NOT DETECTED

## 2014-06-28 LAB — GLUCOSE, CAPILLARY
GLUCOSE-CAPILLARY: 131 mg/dL — AB (ref 70–99)
GLUCOSE-CAPILLARY: 133 mg/dL — AB (ref 70–99)
Glucose-Capillary: 143 mg/dL — ABNORMAL HIGH (ref 70–99)
Glucose-Capillary: 135 mg/dL — ABNORMAL HIGH (ref 70–99)

## 2014-06-28 LAB — TROPONIN I: Troponin I: 0.03 ng/mL (ref <0.031)

## 2014-06-28 MED ORDER — DEXMEDETOMIDINE HCL IN NACL 400 MCG/100ML IV SOLN
0.4000 ug/kg/h | INTRAVENOUS | Status: DC
  Administered 2014-06-28 – 2014-06-29 (×2): 0.7 ug/kg/h via INTRAVENOUS
  Filled 2014-06-28: qty 100
  Filled 2014-06-28: qty 50

## 2014-06-28 MED ORDER — SODIUM CHLORIDE 0.9 % IV SOLN
INTRAVENOUS | Status: DC
  Administered 2014-06-28: 13:00:00 via INTRAVENOUS
  Administered 2014-06-29: 10 mL/h via INTRAVENOUS

## 2014-06-28 MED ORDER — IPRATROPIUM-ALBUTEROL 0.5-2.5 (3) MG/3ML IN SOLN
3.0000 mL | Freq: Four times a day (QID) | RESPIRATORY_TRACT | Status: DC
  Administered 2014-06-28 – 2014-07-01 (×13): 3 mL via RESPIRATORY_TRACT
  Filled 2014-06-28 (×13): qty 3

## 2014-06-28 NOTE — Progress Notes (Signed)
PULMONARY / CRITICAL CARE MEDICINE   Name: [name] Carter MRN: [id number] DOB: 10/15/1960    ADMISSION DATE:  06/27/2014 CONSULTATION DATE:  06/27/14  REFERRING MD :  Dr. Barnie DelG. Krishnan  CHIEF COMPLAINT:  SOB   INITIAL PRESENTATION: 53 y/o M, smoker, admitted 12/26 with SOB, cough, chest tightness.  Found to have acute respiratory failure with mixed acidosis.  PCCM consulted for evaluation.    STUDIES:  05/15/14  CTA Chest >> neg for PE, bilateral upper lobe predominant GGO   SIGNIFICANT EVENTS: 12/26  Admit with SOB, cough & chest tightness.   SUBJECTIVE:  Remains on BiPAP and precedex.  Has productive cough.  C/o neck and back pain. VITAL SIGNS: Temp:  [97.1 F (36.2 C)-97.7 F (36.5 C)] 97.1 F (36.2 C) (12/27 0849) Pulse Rate:  [62-114] 64 (12/27 0900) Resp:  [14-30] 19 (12/27 0900) BP: (105-142)/(53-83) 116/69 mmHg (12/27 0900) SpO2:  [93 %-100 %] 99 % (12/27 0900) FiO2 (%):  [36 %-50 %] 50 % (12/27 0900) Weight:  [182 lb (82.555 kg)] 182 lb (82.555 kg) (12/26 1700)   VENTILATOR SETTINGS: Vent Mode:  [-]  FiO2 (%):  [36 %-50 %] 50 %   INTAKE / OUTPUT:  Intake/Output Summary (Last 24 hours) at 06/28/14 1010 Last data filed at 06/28/14 0900  Gross per 24 hour  Intake 1346.69 ml  Output   1650 ml  Net -303.31 ml    PHYSICAL EXAMINATION: General: no distress Neuro: calm, RASS 0 HEENT:  Bipap mask in place Cardiovascular: regular Lungs: better air movement, b/l wheeze Abdomen: soft, non tender Musculoskeletal: no edema  Skin: multiple tattoos  LABS:  CBC  Recent Labs Lab 06/26/14 1535 06/27/14 0318 06/28/14 0415  WBC 8.4 11.8* 16.9*  HGB 14.2 13.9 12.4*  HCT 42.0 41.9 38.4*  PLT 212 220 185    BMET  Recent Labs Lab 06/26/14 1535 06/27/14 0318 06/28/14 0415  NA 138 141 139  K 3.9 4.9 5.5*  CL 107 107 109  CO2 26 23 23   BUN 10 13 16   CREATININE 0.94 1.55* 0.91  GLUCOSE 109* 265* 141*   Electrolytes  Recent Labs Lab 06/26/14 1535  06/27/14 0318 06/28/14 0415  CALCIUM 9.0 9.1 8.6   Sepsis Markers  Recent Labs Lab 06/27/14 1840  LATICACIDVEN 2.9*     ABG  Recent Labs Lab 06/27/14 0351 06/27/14 0904 06/27/14 1235  PHART 7.265* 7.246* 7.270*  PCO2ART 43.8 39.2 42.4  PO2ART 101.0* 88.0 211.0*   Liver Enzymes  Recent Labs Lab 06/28/14 0415  AST 16  ALT 19  ALKPHOS 49  BILITOT 0.5  ALBUMIN 3.4*     Cardiac Enzymes  Recent Labs Lab 06/27/14 1255 06/27/14 1840 06/27/14 2346  TROPONINI 0.03 0.13* 0.03   Glucose  Recent Labs Lab 06/27/14 1745 06/27/14 2129  GLUCAP 162* 144*    Imaging Dg Chest Port 1 View  06/27/2014   CLINICAL DATA:  Acute onset of respiratory distress. Expiratory wheezing. Initial encounter.  EXAM: PORTABLE CHEST - 1 VIEW  COMPARISON:  Chest radiograph performed 06/26/2014  FINDINGS: The lungs are mildly hypoexpanded. Vascular crowding and vascular congestion are noted. Increased interstitial markings may reflect mild interstitial edema.  The cardiomediastinal silhouette is borderline normal in size. No acute osseous abnormalities are seen.  IMPRESSION: Lungs mildly hypoexpanded. Vascular congestion noted, with question of minimal interstitial edema.   Electronically Signed   By: Roanna RaiderJeffery  Chang M.D.   On: 06/27/2014 03:52     ASSESSMENT / PLAN:  PULMONARY A:  Acute Respiratory Failure - in setting of COPD exacerbation . Bilateral Upper Lobe GGO - in November 2015.  Prior ANA, RF , HIV & ANCA neg. Hx of Intubation x 2. Tobacco abuse. P:   Oxygen to keep SpO2 > 92% BiPAP as needed Scheduled BD's Continue solumedrol 40 g q6h F/u CXR Bronchial hygiene Nicoderm patch  CARDIOVASCULAR A:  Tachycardia - mild, in setting of exacerbation of COPD >> improved 12/27. P:  Monitor hemodynamics  RENAL A:   Acute Kidney Injury >> improved 12/27. Mixed AG Metabolic Acidosis >> improved 45/4012/27. Hyperkalemia. P:   Monitor renal fx, urine outpt,  electrolytes  GASTROINTESTINAL A:   Nutrition. P:   NPO while on BiPAP  HEMATOLOGIC A:   No acute issues  P:  Monitor CBC   INFECTIOUS A:  AECOPD  P:   Day 2 of levaquin  ENDOCRINE A:   Steroid Induced Hyperglycemia  P:   SSI while on steroids   NEUROLOGIC A:   Chronic Pain. Anxiety. P:   RASS goal: -1 PRN percocet for pain  PRN valium for spasms  Precedex gtt PRN fentanyl  FAMILY  - Updates: no family at bedside   CC time 35 minutes  Coralyn HellingVineet Makala Fetterolf, MD [facility] 06/28/2014, 10:10 AM Pager:  [phone] After 3pm call: [phone]

## 2014-06-29 ENCOUNTER — Inpatient Hospital Stay (HOSPITAL_COMMUNITY): Payer: Medicaid Other

## 2014-06-29 LAB — BASIC METABOLIC PANEL
ANION GAP: 10 (ref 5–15)
BUN: 21 mg/dL (ref 6–23)
CO2: 22 mmol/L (ref 19–32)
Calcium: 9 mg/dL (ref 8.4–10.5)
Chloride: 107 mEq/L (ref 96–112)
Creatinine, Ser: 0.73 mg/dL (ref 0.50–1.35)
GFR calc Af Amer: >90 mL/min (ref >90)
GFR calc non Af Amer: >90 mL/min (ref >90)
GLUCOSE: 149 mg/dL — AB (ref 70–99)
Potassium: 4.7 mmol/L (ref 3.5–5.1)
SODIUM: 139 mmol/L (ref 135–145)

## 2014-06-29 LAB — CBC
HCT: 39.6 % (ref 39.0–52.0)
Hemoglobin: 12.8 g/dL — ABNORMAL LOW (ref 13.0–17.0)
MCH: 29.8 pg (ref 26.0–34.0)
MCHC: 32.3 g/dL (ref 30.0–36.0)
MCV: 92.3 fL (ref 78.0–100.0)
PLATELETS: 213 10*3/uL (ref 150–400)
RBC: 4.29 MIL/uL (ref 4.22–5.81)
RDW: 13.3 % (ref 11.5–15.5)
WBC: 15.4 10*3/uL — ABNORMAL HIGH (ref 4.0–10.5)

## 2014-06-29 LAB — GLUCOSE, CAPILLARY
GLUCOSE-CAPILLARY: 142 mg/dL — AB (ref 70–99)
GLUCOSE-CAPILLARY: 164 mg/dL — AB (ref 70–99)
Glucose-Capillary: 114 mg/dL — ABNORMAL HIGH (ref 70–99)
Glucose-Capillary: 143 mg/dL — ABNORMAL HIGH (ref 70–99)

## 2014-06-29 MED ORDER — CETYLPYRIDINIUM CHLORIDE 0.05 % MT LIQD
7.0000 mL | Freq: Two times a day (BID) | OROMUCOSAL | Status: DC
  Administered 2014-06-29 – 2014-07-01 (×3): 7 mL via OROMUCOSAL

## 2014-06-29 MED ORDER — MORPHINE SULFATE 2 MG/ML IJ SOLN
2.0000 mg | INTRAMUSCULAR | Status: DC | PRN
  Administered 2014-06-29 (×5): 4 mg via INTRAVENOUS
  Administered 2014-06-30: 2 mg via INTRAVENOUS
  Administered 2014-06-30 (×5): 4 mg via INTRAVENOUS
  Administered 2014-06-30: 2 mg via INTRAVENOUS
  Administered 2014-06-30 – 2014-07-01 (×5): 4 mg via INTRAVENOUS
  Filled 2014-06-29 (×4): qty 2
  Filled 2014-06-29: qty 1
  Filled 2014-06-29 (×12): qty 2
  Filled 2014-06-29: qty 1

## 2014-06-29 MED ORDER — LORAZEPAM 2 MG/ML IJ SOLN
2.0000 mg | INTRAMUSCULAR | Status: DC | PRN
  Administered 2014-06-29 – 2014-07-01 (×9): 2 mg via INTRAVENOUS
  Filled 2014-06-29 (×10): qty 1

## 2014-06-29 NOTE — Progress Notes (Signed)
PULMONARY / CRITICAL CARE MEDICINE   Name: [name] Carter MRN: [id number] DOB: [birth year]    ADMISSION DATE:  06/27/2014 CONSULTATION DATE:  06/27/14  REFERRING MD :  Dr. Barnie DelG. Krishnan  CHIEF COMPLAINT:  SOB   INITIAL PRESENTATION: 53 y/o M, smoker, admitted 12/26 with SOB, cough, chest tightness.  Found to have acute respiratory failure with mixed acidosis.  PCCM consulted for evaluation.    STUDIES:  05/15/14  CTA Chest >> neg for PE, bilateral upper lobe predominant GGO   SIGNIFICANT EVENTS: 12/26  Admit with SOB, cough & chest tightness.   SUBJECTIVE:  Remains on  precedex.  C/o neck and back pain. Works himself up & impressive pseudowheeze afebrile  VITAL SIGNS: Temp:  [97.1 F (36.2 C)-98.1 F (36.7 C)] 97.4 F (36.3 C) (12/28 0756) Pulse Rate:  [43-81] 81 (12/28 0900) Resp:  [13-23] 23 (12/28 0900) BP: (111-132)/(60-98) 125/81 mmHg (12/28 0900) SpO2:  [90 %-100 %] 90 % (12/28 0900) FiO2 (%):  [45 %-50 %] 45 % (12/27 2035)   VENTILATOR SETTINGS: Vent Mode:  [-]  FiO2 (%):  [45 %-50 %] 45 %   INTAKE / OUTPUT:  Intake/Output Summary (Last 24 hours) at 06/29/14 0930 Last data filed at 06/29/14 0900  Gross per 24 hour  Intake 3803.93 ml  Output   6650 ml  Net -2846.07 ml    PHYSICAL EXAMINATION: General: in mild distress Neuro: anxious, RASS 0 HEENT:  On [place] Cardiovascular: regular Lungs: better air movement, b/l wheeze Abdomen: soft, non tender Musculoskeletal: no edema  Skin: multiple tattoos  LABS:  CBC  Recent Labs Lab 06/26/14 1535 06/27/14 0318 06/28/14 0415  WBC 8.4 11.8* 16.9*  HGB 14.2 13.9 12.4*  HCT 42.0 41.9 38.4*  PLT 212 220 185    BMET  Recent Labs Lab 06/27/14 0318 06/28/14 0415 06/29/14 0600  NA 141 139 139  K 4.9 5.5* 4.7  CL 107 109 107  CO2 23 23 22   BUN 13 16 21   CREATININE 1.55* 0.91 0.73  GLUCOSE 265* 141* 149*   Electrolytes  Recent Labs Lab 06/27/14 0318 06/28/14 0415 06/29/14 0600  CALCIUM 9.1  8.6 9.0   Sepsis Markers  Recent Labs Lab 06/27/14 1840  LATICACIDVEN 2.9*     ABG  Recent Labs Lab 06/27/14 0351 06/27/14 0904 06/27/14 1235  PHART 7.265* 7.246* 7.270*  PCO2ART 43.8 39.2 42.4  PO2ART 101.0* 88.0 211.0*   Liver Enzymes  Recent Labs Lab 06/28/14 0415  AST 16  ALT 19  ALKPHOS 49  BILITOT 0.5  ALBUMIN 3.4*     Cardiac Enzymes  Recent Labs Lab 06/27/14 1255 06/27/14 1840 06/27/14 2346  TROPONINI 0.03 0.13* 0.03   Glucose  Recent Labs Lab 06/27/14 2129 06/28/14 0853 06/28/14 1149 06/28/14 1618 06/28/14 2202 06/29/14 0753  GLUCAP 144* 143* 135* 131* 133* 142*    Imaging No results found.   ASSESSMENT / PLAN:  PULMONARY A: Acute Respiratory Failure - in setting of COPD exacerbation . Bilateral Upper Lobe GGO - in November 2015.  Prior ANA, RF , HIV & ANCA neg. Hx of Intubation x 2. Tobacco abuse. P:   Oxygen to keep SpO2 > 92% BiPAP prn  Scheduled BD's Continue solumedrol 40 g q6h Nicoderm patch  CARDIOVASCULAR A:  Tachycardia - mild, in setting of exacerbation of COPD >> improved 12/27. P:  Monitor hemodynamics  RENAL A:   Acute Kidney Injury >> improved 12/27. Mixed AG Metabolic Acidosis >> improved 95/6212/27. Hyperkalemia. P:   Monitor renal  fx, urine outpt, electrolytes  GASTROINTESTINAL A:   Nutrition. P:   Advance PO  HEMATOLOGIC A:   No acute issues  P:  Monitor CBC   INFECTIOUS A:  AECOPD  P:    levaquin x 5-7 ds  ENDOCRINE A:   Steroid Induced Hyperglycemia  P:   SSI while on steroids   NEUROLOGIC A:   Chronic Pain. Anxiety. P:   RASS goal: -1 PRN percocet for pain  PRN valium for spasms  Precedex gtt PRN fentanyl  FAMILY  - Updates: no family at bedside    Cyril Mourningakesh Alva MD. FCCP. [facility] Pager [phone] If no response call [phone]    06/29/2014, 9:30 AM

## 2014-06-30 ENCOUNTER — Inpatient Hospital Stay (HOSPITAL_COMMUNITY): Payer: Medicaid Other

## 2014-06-30 DIAGNOSIS — M4802 Spinal stenosis, cervical region: Secondary | ICD-10-CM

## 2014-06-30 LAB — BASIC METABOLIC PANEL
ANION GAP: 10 (ref 5–15)
BUN: 22 mg/dL (ref 6–23)
CHLORIDE: 105 meq/L (ref 96–112)
CO2: 24 mmol/L (ref 19–32)
CREATININE: 0.9 mg/dL (ref 0.50–1.35)
Calcium: 9.3 mg/dL (ref 8.4–10.5)
GFR calc Af Amer: >90 mL/min (ref >90)
GFR calc non Af Amer: >90 mL/min (ref >90)
GLUCOSE: 126 mg/dL — AB (ref 70–99)
Potassium: 4.5 mmol/L (ref 3.5–5.1)
Sodium: 139 mmol/L (ref 135–145)

## 2014-06-30 LAB — CBC
HEMATOCRIT: 38.5 % — AB (ref 39.0–52.0)
HEMOGLOBIN: 12.8 g/dL — AB (ref 13.0–17.0)
MCH: 30 pg (ref 26.0–34.0)
MCHC: 33.2 g/dL (ref 30.0–36.0)
MCV: 90.4 fL (ref 78.0–100.0)
Platelets: 203 10*3/uL (ref 150–400)
RBC: 4.26 MIL/uL (ref 4.22–5.81)
RDW: 13.3 % (ref 11.5–15.5)
WBC: 15.8 10*3/uL — ABNORMAL HIGH (ref 4.0–10.5)

## 2014-06-30 LAB — GLUCOSE, CAPILLARY
GLUCOSE-CAPILLARY: 117 mg/dL — AB (ref 70–99)
Glucose-Capillary: 122 mg/dL — ABNORMAL HIGH (ref 70–99)
Glucose-Capillary: 106 mg/dL — ABNORMAL HIGH (ref 70–99)

## 2014-06-30 MED ORDER — METHYLPREDNISOLONE SODIUM SUCC 40 MG IJ SOLR
40.0000 mg | Freq: Two times a day (BID) | INTRAMUSCULAR | Status: DC
  Administered 2014-06-30 – 2014-07-01 (×2): 40 mg via INTRAVENOUS
  Filled 2014-06-30 (×3): qty 1

## 2014-06-30 NOTE — Consult Note (Signed)
Reason for Consult: Neck pain Referring Physician: Critical-care Dr. Mal Amabile Aldape is an 53 y.o. male.  HPI: Patient is a 53 year old M is a long-standing history of the spine for previous back operations long-standing chronic neck pain with radiation down predominantly his right arm into the last 3 fingers of his right hand. He has had some shocking sensations going down his arms whenever he extends his neck back or when he coughs or sneezes. This apparently may been partially worked up he's had a CT scan of his neck and he's been managed this in [place] prior to relocating here recently. Reports probably right upper to me symptoms with weakness in the right hand difficulty holding anything with his right hand and opening jars.  Past Medical History  Diagnosis Date  . COPD (chronic obstructive pulmonary disease)   . Pneumonia   . Chronic back pain   . DDD (degenerative disc disease), lumbar   . DDD (degenerative disc disease), cervical   . Neck pain     Past Surgical History  Procedure Laterality Date  . Back surgery      low back x4  . Bilateral carpal tunnel release    . Ulnar nerve transposition      ? by description, not clear  . Bilteral knee surgery      Family History  Problem Relation Age of Onset  . Heart attack Father     Social History:  reports that he has been smoking Cigarettes.  He has been smoking about 1.00 pack per day. He does not have any smokeless tobacco history on file. He reports that he does not drink alcohol or use illicit drugs.  Allergies:  Allergies  Allergen Reactions  . Vicodin [Hydrocodone-Acetaminophen] Nausea And Vomiting and Other (See Comments)    Made patient feel funny, stomach pain    Medications: I have reviewed the patient's current medications.  Results for orders placed or performed during the hospital encounter of 06/27/14 (from the past 48 hour(s))  Glucose, capillary     Status: Abnormal   Collection Time: 06/28/14  10:02 PM  Result Value Ref Range   Glucose-Capillary 133 (H) 70 - 99 mg/dL   Comment 1 Documented in Chart    Comment 2 Notify RN   Basic metabolic panel     Status: Abnormal   Collection Time: 06/29/14  6:00 AM  Result Value Ref Range   Sodium 139 135 - 145 mmol/L    Comment: Please note change in reference range.   Potassium 4.7 3.5 - 5.1 mmol/L    Comment: Please note change in reference range.   Chloride 107 96 - 112 mEq/L   CO2 22 19 - 32 mmol/L   Glucose, Bld 149 (H) 70 - 99 mg/dL   BUN 21 6 - 23 mg/dL   Creatinine, Ser 0.73 0.50 - 1.35 mg/dL   Calcium 9.0 8.4 - 10.5 mg/dL   GFR calc non Af Amer >90 >90 mL/min   GFR calc Af Amer >90 >90 mL/min    Comment: (NOTE) The eGFR has been calculated using the CKD EPI equation. This calculation has not been validated in all clinical situations. eGFR's persistently <90 mL/min signify possible Chronic Kidney Disease.    Anion gap 10 5 - 15  Glucose, capillary     Status: Abnormal   Collection Time: 06/29/14  7:53 AM  Result Value Ref Range   Glucose-Capillary 142 (H) 70 - 99 mg/dL   Comment 1 Capillary Sample  CBC     Status: Abnormal   Collection Time: 06/29/14  9:20 AM  Result Value Ref Range   WBC 15.4 (H) 4.0 - 10.5 K/uL   RBC 4.29 4.22 - 5.81 MIL/uL   Hemoglobin 12.8 (L) 13.0 - 17.0 g/dL   HCT 13.6 85.9 - 92.3 %   MCV 92.3 78.0 - 100.0 fL   MCH 29.8 26.0 - 34.0 pg   MCHC 32.3 30.0 - 36.0 g/dL   RDW 41.4 43.6 - 01.6 %   Platelets 213 150 - 400 K/uL  Glucose, capillary     Status: Abnormal   Collection Time: 06/29/14 11:45 AM  Result Value Ref Range   Glucose-Capillary 114 (H) 70 - 99 mg/dL   Comment 1 Capillary Sample    Comment 2 Notify RN   Glucose, capillary     Status: Abnormal   Collection Time: 06/29/14  4:48 PM  Result Value Ref Range   Glucose-Capillary 143 (H) 70 - 99 mg/dL   Comment 1 Capillary Sample    Comment 2 Notify RN   Glucose, capillary     Status: Abnormal   Collection Time: 06/29/14  9:45 PM   Result Value Ref Range   Glucose-Capillary 164 (H) 70 - 99 mg/dL   Comment 1 Capillary Sample   Basic metabolic panel     Status: Abnormal   Collection Time: 06/30/14  3:09 AM  Result Value Ref Range   Sodium 139 135 - 145 mmol/L    Comment: Please note change in reference range.   Potassium 4.5 3.5 - 5.1 mmol/L    Comment: Please note change in reference range.   Chloride 105 96 - 112 mEq/L   CO2 24 19 - 32 mmol/L   Glucose, Bld 126 (H) 70 - 99 mg/dL   BUN 22 6 - 23 mg/dL   Creatinine, Ser 5.80 0.50 - 1.35 mg/dL   Calcium 9.3 8.4 - 06.3 mg/dL   GFR calc non Af Amer >90 >90 mL/min   GFR calc Af Amer >90 >90 mL/min    Comment: (NOTE) The eGFR has been calculated using the CKD EPI equation. This calculation has not been validated in all clinical situations. eGFR's persistently <90 mL/min signify possible Chronic Kidney Disease.    Anion gap 10 5 - 15  CBC     Status: Abnormal   Collection Time: 06/30/14  3:09 AM  Result Value Ref Range   WBC 15.8 (H) 4.0 - 10.5 K/uL   RBC 4.26 4.22 - 5.81 MIL/uL   Hemoglobin 12.8 (L) 13.0 - 17.0 g/dL   HCT 49.4 (L) 94.4 - 73.9 %   MCV 90.4 78.0 - 100.0 fL   MCH 30.0 26.0 - 34.0 pg   MCHC 33.2 30.0 - 36.0 g/dL   RDW 58.4 41.7 - 12.7 %   Platelets 203 150 - 400 K/uL  Glucose, capillary     Status: Abnormal   Collection Time: 06/30/14  8:16 AM  Result Value Ref Range   Glucose-Capillary 122 (H) 70 - 99 mg/dL   Comment 1 Notify RN   Glucose, capillary     Status: Abnormal   Collection Time: 06/30/14 12:04 PM  Result Value Ref Range   Glucose-Capillary 117 (H) 70 - 99 mg/dL   Comment 1 Notify RN   Glucose, capillary     Status: Abnormal   Collection Time: 06/30/14  4:24 PM  Result Value Ref Range   Glucose-Capillary 106 (H) 70 - 99 mg/dL   Comment 1  Venous Sample     Dg Chest Port 1 View  06/29/2014   CLINICAL DATA:  COPD.  EXAM: PORTABLE CHEST - 1 VIEW  COMPARISON:  06/07/2014.  FINDINGS: Mediastinum and hilar structures normal.  Stable cardiomegaly. Lungs are clear of acute infiltrates. No pleural effusion or pneumothorax. No acute bony abnormality .  IMPRESSION: 1. No acute cardiopulmonary disease. 2. Stable cardiomegaly.   Electronically Signed   By: Marcello Moores  Register   On: 06/29/2014 07:38    Review of Systems  Constitutional: Negative.   Musculoskeletal: Positive for myalgias, joint pain, falls and neck pain.  Skin: Negative.   Neurological: Positive for dizziness, tingling, sensory change and headaches.   Blood pressure 141/94, pulse 80, temperature 98.2 F (36.8 C), temperature source Oral, resp. rate 20, height $RemoveBe'5\' 9"'[id number]$  (1.753 m), weight 82.555 kg (182 lb), SpO2 94 %. Physical Exam  Neurological: GCS eye subscore is 4. GCS verbal subscore is 5. GCS motor subscore is 6. He displays Babinski's sign on the right side. He displays Babinski's sign on the left side.  Reflex Scores:      Patellar reflexes are 3+ on the right side and 3+ on the left side.      Achilles reflexes are 3+ on the right side and 3+ on the left side. Strength is 5 out of 5 in his deltoid, bicep, tricep, wrist flexion, wrist extension, hand intrinsics on the right are 4+ out of 5 on the left 5 out of 5. He has bilateral ankle clonus.    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

## 2014-06-30 NOTE — Progress Notes (Signed)
PULMONARY / CRITICAL CARE MEDICINE   Name: [name] Carter MRN: [id number] DOB: 1960-08-02    ADMISSION DATE:  06/27/2014 CONSULTATION DATE:  06/27/14  REFERRING MD :  Dr. Barnie DelG. Krishnan  CHIEF COMPLAINT:  SOB   INITIAL PRESENTATION: 53 y/o M, smoker, admitted 12/26 with SOB, cough, chest tightness.  Found to have acute respiratory failure with mixed acidosis.  PCCM consulted for evaluation.    STUDIES:  05/15/14  CTA Chest >> neg for PE, bilateral upper lobe predominant GGO   SIGNIFICANT EVENTS: 12/26  Admit with SOB, cough & chest tightness.   SUBJECTIVE:  Off precedex.  C/o neck and back pain -numbness over rt arm Less anxious appearing afebrile  VITAL SIGNS: Temp:  [97.7 F (36.5 C)-98.4 F (36.9 C)] 97.7 F (36.5 C) (12/29 0800) Pulse Rate:  [54-93] 93 (12/29 0900) Resp:  [10-26] 16 (12/29 0900) BP: (100-156)/(54-90) 130/67 mmHg (12/29 0900) SpO2:  [91 %-99 %] 97 % (12/29 0900)   VENTILATOR SETTINGS:     INTAKE / OUTPUT:  Intake/Output Summary (Last 24 hours) at 06/30/14 0940 Last data filed at 06/30/14 0900  Gross per 24 hour  Intake 2764.7 ml  Output   4900 ml  Net -2135.3 ml    PHYSICAL EXAMINATION: General: in nodistress Neuro: anxious, RASS 0 HEENT:  On [place] Cardiovascular: regular Lungs: better air movement, no wheeze Abdomen: soft, non tender Musculoskeletal: no edema  Skin: multiple tattoos  LABS:  CBC  Recent Labs Lab 06/28/14 0415 06/29/14 0920 06/30/14 0309  WBC 16.9* 15.4* 15.8*  HGB 12.4* 12.8* 12.8*  HCT 38.4* 39.6 38.5*  PLT 185 213 203    BMET  Recent Labs Lab 06/28/14 0415 06/29/14 0600 06/30/14 0309  NA 139 139 139  K 5.5* 4.7 4.5  CL 109 107 105  CO2 23 22 24   BUN 16 21 22   CREATININE 0.91 0.73 0.90  GLUCOSE 141* 149* 126*   Electrolytes  Recent Labs Lab 06/28/14 0415 06/29/14 0600 06/30/14 0309  CALCIUM 8.6 9.0 9.3   Sepsis Markers  Recent Labs Lab 06/27/14 1840  LATICACIDVEN 2.9*      ABG  Recent Labs Lab 06/27/14 0351 06/27/14 0904 06/27/14 1235  PHART 7.265* 7.246* 7.270*  PCO2ART 43.8 39.2 42.4  PO2ART 101.0* 88.0 211.0*   Liver Enzymes  Recent Labs Lab 06/28/14 0415  AST 16  ALT 19  ALKPHOS 49  BILITOT 0.5  ALBUMIN 3.4*     Cardiac Enzymes  Recent Labs Lab 06/27/14 1255 06/27/14 1840 06/27/14 2346  TROPONINI 0.03 0.13* 0.03   Glucose  Recent Labs Lab 06/28/14 2202 06/29/14 0753 06/29/14 1145 06/29/14 1648 06/29/14 2145 06/30/14 0816  GLUCAP 133* 142* 114* 143* 164* 122*    Imaging Dg Chest Port 1 View  06/29/2014   CLINICAL DATA:  COPD.  EXAM: PORTABLE CHEST - 1 VIEW  COMPARISON:  06/07/2014.  FINDINGS: Mediastinum and hilar structures normal. Stable cardiomegaly. Lungs are clear of acute infiltrates. No pleural effusion or pneumothorax. No acute bony abnormality .  IMPRESSION: 1. No acute cardiopulmonary disease. 2. Stable cardiomegaly.   Electronically Signed   By: Maisie Fushomas  Register   On: 06/29/2014 07:38     ASSESSMENT / PLAN:  PULMONARY A: Acute Respiratory Failure - in setting of COPD exacerbation . Bilateral Upper Lobe GGO - in November 2015.  Prior ANA, RF , HIV & ANCA neg. Hx of Intubation x 2. Tobacco abuse. P:   Oxygen to keep SpO2 > 92% Dc BiPAP Scheduled BD's Continue solumedrol  40 g q12 h -po pred in 24h Nicoderm patch  CARDIOVASCULAR A:  Tachycardia - mild, in setting of exacerbation of COPD >> improved 12/27. P:  Monitor hemodynamics  RENAL A:   Acute Kidney Injury >> improved 12/27. Mixed AG Metabolic Acidosis >> improved 16/1012/27. Hyperkalemia. P:   Monitor renal fx, urine outpt, electrolytes    INFECTIOUS A:  AECOPD  P:    levaquin x 5-7 ds  ENDOCRINE A:   Steroid Induced Hyperglycemia  P:   SSI while on steroids   NEUROLOGIC A:   Chronic Pain. 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 P:   RASS goal: -1 PRN percocet for pain  PRN valium for spasms  Dc Precedex  gtt PRN fentanyl neurosx input  Summary - he moved from [place] to [place] 4965m ago,has not been evaluated for c spine yet, will ask neurosx to see Tr to floor & to triad   Cyril Mourningakesh Alva MD. Tonny BollmanFCCP. [facility] Pager [phone] If no response call [phone]    06/30/2014, 9:40 AM

## 2014-06-30 NOTE — Progress Notes (Signed)
Pt arrived to 4N21. Pt requested pain meds but writer informed him that it was not time and helped him reposition for comfort. Pt alert and oriented x4. Call bell and phone within reach. Will continue to monitor.

## 2014-07-01 LAB — GLUCOSE, CAPILLARY
GLUCOSE-CAPILLARY: 317 mg/dL — AB (ref 70–99)
GLUCOSE-CAPILLARY: 107 mg/dL — AB (ref 70–99)
Glucose-Capillary: 109 mg/dL — ABNORMAL HIGH (ref 70–99)

## 2014-07-01 MED ORDER — LEVOFLOXACIN 500 MG PO TABS: 500.0000 mg | ORAL_TABLET | Freq: Every day | ORAL | Status: DC

## 2014-07-01 MED ORDER — INSULIN ASPART 100 UNIT/ML ~~LOC~~ SOLN: 0.0000 [IU] | Freq: Three times a day (TID) | SUBCUTANEOUS | Status: DC

## 2014-07-01 MED ORDER — OXYCODONE-ACETAMINOPHEN 10-325 MG PO TABS: 1.0000 | ORAL_TABLET | ORAL | Status: DC | PRN

## 2014-07-01 MED ORDER — NICOTINE 14 MG/24HR TD PT24: 14.0000 mg | MEDICATED_PATCH | Freq: Every day | TRANSDERMAL | Status: DC

## 2014-07-01 MED ORDER — INSULIN ASPART 100 UNIT/ML ~~LOC~~ SOLN
5.0000 [IU] | Freq: Once | SUBCUTANEOUS | Status: AC
  Administered 2014-07-01: 5 [IU] via SUBCUTANEOUS

## 2014-07-01 MED ORDER — INSULIN ASPART 100 UNIT/ML ~~LOC~~ SOLN: 0.0000 [IU] | Freq: Every day | SUBCUTANEOUS | Status: DC

## 2014-07-01 MED ORDER — PREDNISONE (PAK) 10 MG PO TABS: ORAL_TABLET | Freq: Every day | ORAL | Status: DC

## 2014-07-01 NOTE — Discharge Summary (Signed)
Physician Discharge Summary  [name] Carter ZOX:[id number] DOB: Nov 26, 1960 DOA: 06/27/2014  PCP: [name]  Admit date: 06/27/2014 Discharge date: 07/01/2014  Time spent: 45 minutes  Recommendations for Outpatient Follow-up:  Patient will be discharged to home.  He is to followup with his primary care physician within one week of discharge.  Patient should also follow up with Dr. Wynetta Emery within 1-2 weeks of discharge.  He should continue taking his medication as prescribed.  Spoke with patient regarding seeking a pain management specialist.  Patient should follow a carb modified diet.  He may resume normal activity as tolerated.  Discharge Diagnoses:  Acute respiratory failure/COPD exacerbation Tachycardia Acute kidney injury/metabolic acidosis with hyperkalemia Chronic pain/cervical stenosis with referred pain Steroid-induced hyperglycemia Tobacco abuse  Discharge Condition: Stable  Diet recommendation: Carb modified  Filed Weights   06/27/14 1700  Weight: 82.555 kg (182 lb)    History of present illness:  On 06/27/2014 by Dr. Osvaldo Shipper [name] Carter is a 53 y.o. male without has medical history of COPD, chronic neck pain, chronic back pain who was in his usual state of health until yesterday when he started developing shortness of breath. He went to the emergency department at [facility]. But at that time, he was mainly complaining of his neck pain. He was treated symptomatically and was discharged home. However, his breathing got worse and he decided to come to [place]. He complains of chest pain, especially with coughing. Has a cough with the thick whitish expectoration. Denies any blood in the sputum. No nausea, vomiting. No fever or chills. No dizziness or lightheadedness. He is very anxious. He has received breathing treatments in the emergency department, and is feeling slightly better. It is difficult to obtain history from him as he kept  talking about his neck pain rather than his breathing difficulty. He apparently is to be seen by orthopedic or neurosurgeon in the near future regarding his neck issues.  Hospital Course:  Acute respiratory failure/COPD exacerbation -Resolved -Initially required BiPAP -Was placed on oxygen to maintain his saturations above 92% and Solu-Medrol, and Levaquin -Patient counseling and smoking cessation -Patient did have workup in the past: Negative AMA, RF, HIV, ANCA  Tachycardia  -Likely secondary to patient's acute respiratory failure and COPD exacerbation -Resolved  Acute kidney injury/metabolic acidosis with hyperkalemia -Resolved  Chronic pain/cervical stenosis with referred pain -Patient did have MRI of the neck which showed chronic changes nothing acute -Neurosurgery consulted and appreciate, Dr. Wynetta Emery will follow-up with the patient in 2 weeks as an outpatient -Will discharge patient with a finite amount of oxycodone -Continue Valium for spasms  Steroid Induced Hyperglycemia -Was placed on ISS during hospitalization  Tobacco abuse -Patient given smoking cessation counseling -Continue nicotine patch  Procedures: None  Consultations: PCCM Neurosurgery  Discharge Exam: Filed Vitals:   07/01/14 0911  BP: 127/82  Pulse: 77  Temp: 97.9 F (36.6 C)  Resp: 20     General: Well developed, well nourished, NAD, appears stated age  HEENT: NCAT, mucous membranes moist  Cardiovascular: S1 S2 auscultated, no rubs, murmurs or gallops. Regular rate and rhythm.  Respiratory: Essentially clear however mild expiratory wheeze  Abdomen: Soft, nontender, nondistended, + bowel sounds  Extremities: warm dry without cyanosis clubbing or edema  Neuro: AAOx3, no focal deficits  Skin: Without rashes exudates or nodules. Multiple tattoos  Psych: Normal affect and demeanor with intact judgement and insight  Discharge Instructions      Discharge Instructions  Discharge  instructions    Complete by:  As directed   Patient will be discharged to home.  He is to followup with his primary care physician within one week of discharge.  Patient should also follow up with Dr. Wynetta Emeryram within 1-2 weeks of discharge.  He should continue taking his medication as prescribed.  Spoke with patient regarding seeking a pain management specialist.  Patient should follow a carb modified diet.  He may resume normal activity as tolerated.            Medication List    STOP taking these medications        doxycycline 100 MG tablet  Commonly known as:  VIBRA-TABS     oxyCODONE-acetaminophen 5-325 MG per tablet  Commonly known as:  PERCOCET  Replaced by:  oxyCODONE-acetaminophen 10-325 MG per tablet     predniSONE 10 MG tablet  Commonly known as:  DELTASONE  Replaced by:  predniSONE 10 MG tablet     predniSONE 20 MG tablet  Commonly known as:  DELTASONE      TAKE these medications        acetaminophen-codeine 300-30 MG per tablet  Commonly known as:  TYLENOL #3  Take 1-2 tablets by mouth every 6 (six) hours as needed.     aerochamber plus with mask inhaler  Use as instructed     albuterol (2.5 MG/3ML) 0.083% nebulizer solution  Commonly known as:  PROVENTIL  Take 3 mLs (2.5 mg total) by nebulization every 4 (four) hours as needed for wheezing or shortness of breath.     albuterol 108 (90 BASE) MCG/ACT inhaler  Commonly known as:  PROVENTIL HFA;VENTOLIN HFA  Inhale 2 puffs into the lungs every 4 (four) hours as needed for wheezing or shortness of breath.     diazepam 5 MG tablet  Commonly known as:  VALIUM  Take 1 tablet (5 mg total) by mouth every 6 (six) hours as needed for muscle spasms.     Ipratropium-Albuterol 20-100 MCG/ACT Aers respimat  Commonly known as:  COMBIVENT  Inhale 1 puff into the lungs 4 (four) times daily.     levofloxacin 500 MG tablet  Commonly known as:  LEVAQUIN  Take 1 tablet (500 mg total) by mouth daily.     nicotine 14 mg/24hr  patch  Commonly known as:  NICODERM CQ - dosed in mg/24 hours  Place 1 patch (14 mg total) onto the skin daily.     oxyCODONE-acetaminophen 10-325 MG per tablet  Commonly known as:  PERCOCET  Take 1 tablet by mouth every 4 (four) hours as needed for pain.     predniSONE 10 MG tablet  Commonly known as:  STERAPRED UNI-PAK  Take by mouth daily. Prednisone dosing: Take  Prednisone 40mg  (4 tabs) x 3 days, then taper to 30mg  (3 tabs) x 3 days, then 20mg  (2 tabs) x 3days, then 10mg  (1 tab) x 3days, then OFF.     promethazine 12.5 MG tablet  Commonly known as:  PHENERGAN  1 po q6h prn nausea       Allergies  Allergen Reactions  . Vicodin [Hydrocodone-Acetaminophen] Nausea And Vomiting and Other (See Comments)    Made patient feel funny, stomach pain   Follow-up Information    [facility] Schedule an appointment as soon as possible for a visit in 1 week.   Why:  Hospital followup   Contact information:   [address] [place] [address] [phone]  The results of significant diagnostics from this hospitalization (including imaging, microbiology, ancillary and laboratory) are listed below for reference.    Significant Diagnostic Studies: Dg Chest 1 View  06/26/2014   CLINICAL DATA:  Severe shortness of breath, chest pain, history COPD, pneumonia, smoking  EXAM: CHEST - 1 VIEW  COMPARISON:  Portable exam 1527 hr compared to 06/13/2014  FINDINGS: Normal heart size, mediastinal contours and pulmonary vascularity.  Lungs clear.  No pleural effusion or pneumothorax.  Osseous structures unremarkable.  IMPRESSION: No acute abnormalities.   Electronically Signed   By: Ulyses SouthwardMark  Boles M.D.   On: 06/26/2014 15:45   Mr Cervical Spine Wo Contrast  06/30/2014   CLINICAL DATA:  Chronic neck pain radiating to RIGHT arm, electric sensation to RIGHT arm when coughing or sneezing. RIGHT arm weakness.  EXAM: MRI CERVICAL SPINE WITHOUT CONTRAST  TECHNIQUE:  Multiplanar, multisequence MR imaging of the cervical spine was performed. No intravenous contrast was administered.  COMPARISON:  CT of the cervical spine May 30, 2014  FINDINGS: Cervical vertebral bodies intact and aligned, broad reversed cervical lordosis. Moderate to severe C5-6, C6-7 at least moderate C7-T1 degenerative disc speech. Moderate subacute to chronic discogenic endplate changes C5-6, moderate chronic discogenic endplate changes C4-5 and C6-7 and mild acute and chronic discogenic endplate changes C7-T1. No STIR signal abnormality to suggest fracture.  Cervical spinal cord is normal morphology and signal characteristics from the cervical medullary junction to the level of T2, the most caudal well visualized level. Craniocervical junction is intact. Small amount of low signal pannus about the odontoid process can be seen with CPPD. Included prevertebral and paraspinal soft tissues are normal.  Level by level evaluation:  C2-3: Annular bulging, uncovertebral hypertrophy and mild facet arthropathy without canal stenosis. Mild LEFT neural foraminal narrowing.  C3-4: 2 mm broad-based central disc protrusion. Uncovertebral hypertrophy. Moderate to severe RIGHT, mild LEFT facet arthropathy. Mild canal stenosis. Moderate RIGHT neural foraminal narrowing.  C4-5: Annular bulging, uncovertebral hypertrophy. Moderate RIGHT greater LEFT facet arthropathy without canal stenosis. Mild to moderate RIGHT neural foraminal narrowing.  C5-6: 2 mm broad-based disc bulge, uncovertebral hypertrophy. Mild facet arthropathy. Mild canal stenosis. Severe bilateral neural foraminal narrowing.  C6-7: Small RIGHT subarticular disc protrusion, uncal vertebral hypertrophy and mild facet arthropathy. No canal stenosis. Severe RIGHT, mild LEFT neural foraminal narrowing.  C7-T1: 1-2 mm broad-based disc bulge with superimposed LEFT subarticular broad-based disc protrusion. No canal stenosis. Severe LEFT greater than RIGHT neural  foraminal narrowing.  IMPRESSION: No acute fracture malalignment.  Broad reversed cervical lordosis.  Degenerative change of the cervical spine resulting in mild canal stenosis at C5-6.  Neural foraminal narrowing at all cervical levels: Severe from C5-6 through C7-T1.   Electronically Signed   By: Awilda Metroourtnay  Bloomer   On: 06/30/2014 22:37   Dg Chest Port 1 View  06/29/2014   CLINICAL DATA:  COPD.  EXAM: PORTABLE CHEST - 1 VIEW  COMPARISON:  06/07/2014.  FINDINGS: Mediastinum and hilar structures normal. Stable cardiomegaly. Lungs are clear of acute infiltrates. No pleural effusion or pneumothorax. No acute bony abnormality .  IMPRESSION: 1. No acute cardiopulmonary disease. 2. Stable cardiomegaly.   Electronically Signed   By: Maisie Fushomas  Register   On: 06/29/2014 07:38   Dg Chest Port 1 View  06/27/2014   CLINICAL DATA:  Acute onset of respiratory distress. Expiratory wheezing. Initial encounter.  EXAM: PORTABLE CHEST - 1 VIEW  COMPARISON:  Chest radiograph performed 06/26/2014  FINDINGS: The lungs are mildly hypoexpanded. Vascular crowding  and vascular congestion are noted. Increased interstitial markings may reflect mild interstitial edema.  The cardiomediastinal silhouette is borderline normal in size. No acute osseous abnormalities are seen.  IMPRESSION: Lungs mildly hypoexpanded. Vascular congestion noted, with question of minimal interstitial edema.   Electronically Signed   By: Roanna Raider M.D.   On: 06/27/2014 03:52   Dg Chest Portable 1 View  06/13/2014   CLINICAL DATA:  Generalized chest pain, shortness of breath and wheezing which began earlier today. Current history of COPD. Current smoker.  EXAM: PORTABLE CHEST - 1 VIEW  COMPARISON:  Portable chest x-ray 05/13/2014, 05/09/2014, 04/24/2014.  FINDINGS: Cardiomediastinal silhouette unremarkable, unchanged. Prominent bronchovascular markings diffusely and moderate central peribronchial thickening, more so than on the prior examinations. No  confluent airspace consolidation. No visible pleural effusions.  IMPRESSION: Moderate changes of acute bronchitis and/or asthma without focal airspace pneumonia.   Electronically Signed   By: Hulan Saas M.D.   On: 06/13/2014 15:13    Microbiology: Recent Results (from the past 240 hour(s))  MRSA PCR Screening     Status: None   Collection Time: 06/27/14  4:48 PM  Result Value Ref Range Status   MRSA by PCR NEGATIVE NEGATIVE Final    Comment:        The GeneXpert MRSA Assay (FDA approved for NASAL specimens only), is one component of a comprehensive MRSA colonization surveillance program. It is not intended to diagnose MRSA infection nor to guide or monitor treatment for MRSA infections.      Labs: Basic Metabolic Panel:  Recent Labs Lab 06/26/14 1535 06/27/14 0318 06/28/14 0415 06/29/14 0600 06/30/14 0309  NA 138 141 139 139 139  K 3.9 4.9 5.5* 4.7 4.5  CL 107 107 109 107 105  CO2 26 23 23 22 24   GLUCOSE 109* 265* 141* 149* 126*  BUN 10 13 16 21 22   CREATININE 0.94 1.55* 0.91 0.73 0.90  CALCIUM 9.0 9.1 8.6 9.0 9.3   Liver Function Tests:  Recent Labs Lab 06/28/14 0415  AST 16  ALT 19  ALKPHOS 49  BILITOT 0.5  PROT 5.8*  ALBUMIN 3.4*   No results for input(s): LIPASE, AMYLASE in the last 168 hours. No results for input(s): AMMONIA in the last 168 hours. CBC:  Recent Labs Lab 06/26/14 1535 06/27/14 0318 06/28/14 0415 06/29/14 0920 06/30/14 0309  WBC 8.4 11.8* 16.9* 15.4* 15.8*  NEUTROABS 4.2 10.6*  --   --   --   HGB 14.2 13.9 12.4* 12.8* 12.8*  HCT 42.0 41.9 38.4* 39.6 38.5*  MCV 90.5 90.1 91.0 92.3 90.4  PLT 212 220 185 213 203   Cardiac Enzymes:  Recent Labs Lab 06/26/14 1535 06/27/14 1255 06/27/14 1840 06/27/14 2346  TROPONINI <0.03 0.03 0.13* 0.03   BNP: BNP (last 3 results)  Recent Labs  04/24/14 1144 05/13/14 1352  PROBNP 45.3 120.7   CBG:  Recent Labs Lab 06/30/14 0816 06/30/14 1204 06/30/14 1624  07/01/14 0514 07/01/14 0737  GLUCAP 122* 117* 106* 317* 107*       Signed:  Mischa Brittingham  [facility] 07/01/2014, 11:00 AM

## 2014-07-01 NOTE — Progress Notes (Signed)
Patient was discharged home with family friend. IV discontinued, discharge instructions and medications reviewed with patient. Patient stated he understood instructions and medications.

## 2014-07-01 NOTE — Discharge Instructions (Signed)
Chronic Obstructive Pulmonary Disease Chronic obstructive pulmonary disease (COPD) is a common lung condition in which airflow from the lungs is limited. COPD is a general term that can be used to describe many different lung problems that limit airflow, including both chronic bronchitis and emphysema. If you have COPD, your lung function will probably never return to normal, but there are measures you can take to improve lung function and make yourself feel better.  CAUSES   Smoking (common).   Exposure to secondhand smoke.   Genetic problems.  Chronic inflammatory lung diseases or recurrent infections. SYMPTOMS   Shortness of breath, especially with physical activity.   Deep, persistent (chronic) cough with a large amount of thick mucus.   Wheezing.   Rapid breaths (tachypnea).   Gray or bluish discoloration (cyanosis) of the skin, especially in fingers, toes, or lips.   Fatigue.   Weight loss.   Frequent infections or episodes when breathing symptoms become much worse (exacerbations).   Chest tightness. DIAGNOSIS  Your health care provider will take a medical history and perform a physical examination to make the initial diagnosis. Additional tests for COPD may include:   Lung (pulmonary) function tests.  Chest X-ray.  CT scan.  Blood tests. TREATMENT  Treatment available to help you feel better when you have COPD includes:   Inhaler and nebulizer medicines. These help manage the symptoms of COPD and make your breathing more comfortable.  Supplemental oxygen. Supplemental oxygen is only helpful if you have a low oxygen level in your blood.   Exercise and physical activity. These are beneficial for nearly all people with COPD. Some people may also benefit from a pulmonary rehabilitation program. HOME CARE INSTRUCTIONS   Take all medicines (inhaled or pills) as directed by your health care provider.  Avoid over-the-counter medicines or cough syrups  that dry up your airway (such as antihistamines) and slow down the elimination of secretions unless instructed otherwise by your health care provider.   If you are a smoker, the most important thing that you can do is stop smoking. Continuing to smoke will cause further lung damage and breathing trouble. Ask your health care provider for help with quitting smoking. He or she can direct you to community resources or hospitals that provide support.  Avoid exposure to irritants such as smoke, chemicals, and fumes that aggravate your breathing.  Use oxygen therapy and pulmonary rehabilitation if directed by your health care provider. If you require home oxygen therapy, ask your health care provider whether you should purchase a pulse oximeter to measure your oxygen level at home.   Avoid contact with individuals who have a contagious illness.  Avoid extreme temperature and humidity changes.  Eat healthy foods. Eating smaller, more frequent meals and resting before meals may help you maintain your strength.  Stay active, but balance activity with periods of rest. Exercise and physical activity will help you maintain your ability to do things you want to do.  Preventing infection and hospitalization is very important when you have COPD. Make sure to receive all the vaccines your health care provider recommends, especially the pneumococcal and influenza vaccines. Ask your health care provider whether you need a pneumonia vaccine.  Learn and use relaxation techniques to manage stress.  Learn and use controlled breathing techniques as directed by your health care provider. Controlled breathing techniques include:   Pursed lip breathing. Start by breathing in (inhaling) through your nose for 1 second. Then, purse your lips as if you were   going to whistle and breathe out (exhale) through the pursed lips for 2 seconds.   Diaphragmatic breathing. Start by putting one hand on your abdomen just above  your waist. Inhale slowly through your nose. The hand on your abdomen should move out. Then purse your lips and exhale slowly. You should be able to feel the hand on your abdomen moving in as you exhale.   Learn and use controlled coughing to clear mucus from your lungs. Controlled coughing is a series of short, progressive coughs. The steps of controlled coughing are:  1. Lean your head slightly forward.  2. Breathe in deeply using diaphragmatic breathing.  3. Try to hold your breath for 3 seconds.  4. Keep your mouth slightly open while coughing twice.  5. Spit any mucus out into a tissue.  6. Rest and repeat the steps once or twice as needed. SEEK MEDICAL CARE IF:   You are coughing up more mucus than usual.   There is a change in the color or thickness of your mucus.   Your breathing is more labored than usual.   Your breathing is faster than usual.  SEEK IMMEDIATE MEDICAL CARE IF:   You have shortness of breath while you are resting.   You have shortness of breath that prevents you from:  Being able to talk.   Performing your usual physical activities.   You have chest pain lasting longer than 5 minutes.   Your skin color is more cyanotic than usual.  You measure low oxygen saturations for longer than 5 minutes with a pulse oximeter. MAKE SURE YOU:   Understand these instructions.  Will watch your condition.  Will get help right away if you are not doing well or get worse. Document Released: 03/29/2005 Document Revised: 11/03/2013 Document Reviewed: 02/13/2013 Exit[employer] Patient Information 2015 [employer]. This information is not intended to replace advice given to you by your health care provider. Make sure you discuss any questions you have with your health care provider. Smoking Cessation Quitting smoking is important to your health and has many advantages. However, it is not always easy to quit since nicotine is a very addictive drug. Oftentimes,  people try 3 times or more before being able to quit. This document explains the best ways for you to prepare to quit smoking. Quitting takes hard work and a lot of effort, but you can do it. ADVANTAGES OF QUITTING SMOKING  You will live longer, feel better, and live better.  Your body will feel the impact of quitting smoking almost immediately.  Within 20 minutes, blood pressure decreases. Your pulse returns to its normal level.  After 8 hours, carbon monoxide levels in the blood return to normal. Your oxygen level increases.  After 24 hours, the chance of having a heart attack starts to decrease. Your breath, hair, and body stop smelling like smoke.  After 48 hours, damaged nerve endings begin to recover. Your sense of taste and smell improve.  After 72 hours, the body is virtually free of nicotine. Your bronchial tubes relax and breathing becomes easier.  After 2 to 12 weeks, lungs can hold more air. Exercise becomes easier and circulation improves.  The risk of having a heart attack, stroke, cancer, or lung disease is greatly reduced.  After 1 year, the risk of coronary heart disease is cut in half.  After 5 years, the risk of stroke falls to the same as a nonsmoker.  After 10 years, the risk of lung cancer is   cut in half and the risk of other cancers decreases significantly.  After 15 years, the risk of coronary heart disease drops, usually to the level of a nonsmoker.  If you are pregnant, quitting smoking will improve your chances of having a healthy baby.  The people you live with, especially any children, will be healthier.  You will have extra money to spend on things other than cigarettes. QUESTIONS TO THINK ABOUT BEFORE ATTEMPTING TO QUIT You may want to talk about your answers with your health care provider.  Why do you want to quit?  If you tried to quit in the past, what helped and what did not?  What will be the most difficult situations for you after you  quit? How will you plan to handle them?  Who can help you through the tough times? Your family? Friends? A health care provider?  What pleasures do you get from smoking? What ways can you still get pleasure if you quit? Here are some questions to ask your health care provider:  How can you help me to be successful at quitting?  What medicine do you think would be best for me and how should I take it?  What should I do if I need more help?  What is smoking withdrawal like? How can I get information on withdrawal? GET READY  Set a quit date.  Change your environment by getting rid of all cigarettes, ashtrays, matches, and lighters in your home, car, or work. Do not let people smoke in your home.  Review your past attempts to quit. Think about what worked and what did not. GET SUPPORT AND ENCOURAGEMENT You have a better chance of being successful if you have help. You can get support in many ways.  Tell your family, friends, and coworkers that you are going to quit and need their support. Ask them not to smoke around you.  Get individual, group, or telephone counseling and support. Programs are available at local hospitals and health centers. Call your local health department for information about programs in your area.  Spiritual beliefs and practices may help some smokers quit.  Download a "quit meter" on your computer to keep track of quit statistics, such as how long you have gone without smoking, cigarettes not smoked, and money saved.  Get a self-help book about quitting smoking and staying off tobacco. LEARN NEW SKILLS AND BEHAVIORS 7. Distract yourself from urges to smoke. Talk to someone, go for a walk, or occupy your time with a task. 8. Change your normal routine. Take a different route to work. Drink tea instead of coffee. Eat breakfast in a different place. 9. Reduce your stress. Take a hot bath, exercise, or read a book. 10. Plan something enjoyable to do every day.  Reward yourself for not smoking. 11. Explore interactive web-based programs that specialize in helping you quit. GET MEDICINE AND USE IT CORRECTLY Medicines can help you stop smoking and decrease the urge to smoke. Combining medicine with the above behavioral methods and support can greatly increase your chances of successfully quitting smoking.  Nicotine replacement therapy helps deliver nicotine to your body without the negative effects and risks of smoking. Nicotine replacement therapy includes nicotine gum, lozenges, inhalers, nasal sprays, and skin patches. Some may be available over-the-counter and others require a prescription.  Antidepressant medicine helps people abstain from smoking, but how this works is unknown. This medicine is available by prescription.  Nicotinic receptor partial agonist medicine simulates the effect of   nicotine in your brain. This medicine is available by prescription. Ask your health care provider for advice about which medicines to use and how to use them based on your health history. Your health care provider will tell you what side effects to look out for if you choose to be on a medicine or therapy. Carefully read the information on the package. Do not use any other product containing nicotine while using a nicotine replacement product.  RELAPSE OR DIFFICULT SITUATIONS Most relapses occur within the first 3 months after quitting. Do not be discouraged if you start smoking again. Remember, most people try several times before finally quitting. You may have symptoms of withdrawal because your body is used to nicotine. You may crave cigarettes, be irritable, feel very hungry, cough often, get headaches, or have difficulty concentrating. The withdrawal symptoms are only temporary. They are strongest when you first quit, but they will go away within 10-14 days. To reduce the chances of relapse, try to:  Avoid drinking alcohol. Drinking lowers your chances of  successfully quitting.  Reduce the amount of caffeine you consume. Once you quit smoking, the amount of caffeine in your body increases and can give you symptoms, such as a rapid heartbeat, sweating, and anxiety.  Avoid smokers because they can make you want to smoke.  Do not let weight gain distract you. Many smokers will gain weight when they quit, usually less than 10 pounds. Eat a healthy diet and stay active. You can always lose the weight gained after you quit.  Find ways to improve your mood other than smoking. FOR MORE INFORMATION  www.smokefree.gov  Document Released: 06/13/2001 Document Revised: 11/03/2013 Document Reviewed: 09/28/2011 Exit[employer] Patient Information 2015 [employer]. This information is not intended to replace advice given to you by your health care provider. Make sure you discuss any questions you have with your health care provider.  

## 2014-07-01 NOTE — Progress Notes (Signed)
Checked pt CBG at 5am and it was 317, paged [facility] to get further order at 5:30  and again at 6:00am..... Waiting response

## 2014-07-07 ENCOUNTER — Ambulatory Visit: Payer: Self-pay | Admitting: Orthopedic Surgery

## 2014-08-20 ENCOUNTER — Inpatient Hospital Stay (HOSPITAL_COMMUNITY)
Admission: EM | Admit: 2014-08-20 | Discharge: 2014-08-21 | DRG: 190 | Disposition: A | Discharge status: Home / Self Care | Payer: Medicaid Other | Attending: Family Medicine | Admitting: Family Medicine

## 2014-08-20 ENCOUNTER — Emergency Department (HOSPITAL_COMMUNITY): Payer: Medicaid Other

## 2014-08-20 ENCOUNTER — Encounter (HOSPITAL_COMMUNITY): Payer: Self-pay

## 2014-08-20 DIAGNOSIS — J9600 Acute respiratory failure, unspecified whether with hypoxia or hypercapnia: Secondary | ICD-10-CM | POA: Diagnosis present

## 2014-08-20 DIAGNOSIS — F17210 Nicotine dependence, cigarettes, uncomplicated: Secondary | ICD-10-CM | POA: Diagnosis present

## 2014-08-20 DIAGNOSIS — Z79899 Other long term (current) drug therapy: Secondary | ICD-10-CM

## 2014-08-20 DIAGNOSIS — J441 Chronic obstructive pulmonary disease with (acute) exacerbation: Secondary | ICD-10-CM | POA: Diagnosis present

## 2014-08-20 DIAGNOSIS — R0602 Shortness of breath: Secondary | ICD-10-CM | POA: Diagnosis present

## 2014-08-20 DIAGNOSIS — M542 Cervicalgia: Secondary | ICD-10-CM | POA: Diagnosis present

## 2014-08-20 DIAGNOSIS — G8929 Other chronic pain: Secondary | ICD-10-CM | POA: Diagnosis present

## 2014-08-20 DIAGNOSIS — F17200 Nicotine dependence, unspecified, uncomplicated: Secondary | ICD-10-CM | POA: Diagnosis present

## 2014-08-20 LAB — BLOOD GAS, ARTERIAL
Acid-Base Excess: 0.2 mmol/L (ref 0.0–2.0)
BICARBONATE: 25.1 meq/L — AB (ref 20.0–24.0)
Delivery systems: POSITIVE
Drawn by: 234301
Expiratory PAP: 8
FIO2: 40 %
Inspiratory PAP: 20
O2 Saturation: 97.2 %
PATIENT TEMPERATURE: 37
PCO2 ART: 45.9 mmHg — AB (ref 35.0–45.0)
PH ART: 7.356 (ref 7.350–7.450)
TCO2: 22 mmol/L (ref 0–100)
pO2, Arterial: 106 mmHg — ABNORMAL HIGH (ref 80.0–100.0)

## 2014-08-20 LAB — TYPE AND SCREEN
ABO/RH(D): A POS
Antibody Screen: NEGATIVE

## 2014-08-20 LAB — BASIC METABOLIC PANEL
ANION GAP: 7 (ref 5–15)
BUN: 14 mg/dL (ref 6–23)
CALCIUM: 9.4 mg/dL (ref 8.4–10.5)
CO2: 28 mmol/L (ref 19–32)
Chloride: 105 mmol/L (ref 96–112)
Creatinine, Ser: 0.97 mg/dL (ref 0.50–1.35)
GFR calc Af Amer: >90 mL/min (ref >90)
GLUCOSE: 100 mg/dL — AB (ref 70–99)
POTASSIUM: 3.6 mmol/L (ref 3.5–5.1)
SODIUM: 140 mmol/L (ref 135–145)

## 2014-08-20 LAB — CBC WITH DIFFERENTIAL/PLATELET
Basophils Absolute: 0 10*3/uL (ref 0.0–0.1)
Basophils Relative: 0 % (ref 0–1)
Eosinophils Absolute: 0.4 10*3/uL (ref 0.0–0.7)
Eosinophils Relative: 4 % (ref 0–5)
HCT: 44.8 % (ref 39.0–52.0)
Hemoglobin: 15.2 g/dL (ref 13.0–17.0)
LYMPHS ABS: 3.2 10*3/uL (ref 0.7–4.0)
LYMPHS PCT: 35 % (ref 12–46)
MCH: 31 pg (ref 26.0–34.0)
MCHC: 33.9 g/dL (ref 30.0–36.0)
MCV: 91.2 fL (ref 78.0–100.0)
Monocytes Absolute: 0.7 10*3/uL (ref 0.1–1.0)
Monocytes Relative: 8 % (ref 3–12)
NEUTROS ABS: 4.8 10*3/uL (ref 1.7–7.7)
NEUTROS PCT: 53 % (ref 43–77)
PLATELETS: 232 10*3/uL (ref 150–400)
RBC: 4.91 MIL/uL (ref 4.22–5.81)
RDW: 13.2 % (ref 11.5–15.5)
WBC: 9.1 10*3/uL (ref 4.0–10.5)

## 2014-08-20 LAB — TROPONIN I

## 2014-08-20 LAB — I-STAT CG4 LACTIC ACID, ED: LACTIC ACID, VENOUS: 1.71 mmol/L (ref 0.5–2.0)

## 2014-08-20 LAB — MRSA PCR SCREENING: MRSA by PCR: NEGATIVE

## 2014-08-20 MED ORDER — ALBUTEROL (5 MG/ML) CONTINUOUS INHALATION SOLN
10.0000 mg/h | INHALATION_SOLUTION | Freq: Once | RESPIRATORY_TRACT | Status: AC
  Administered 2014-08-20: 10 mg/h via RESPIRATORY_TRACT
  Filled 2014-08-20: qty 20

## 2014-08-20 MED ORDER — ACETAMINOPHEN 325 MG PO TABS: 650.0000 mg | ORAL_TABLET | Freq: Four times a day (QID) | ORAL | Status: DC | PRN

## 2014-08-20 MED ORDER — ACETAMINOPHEN 650 MG RE SUPP
650.0000 mg | Freq: Four times a day (QID) | RECTAL | Status: DC | PRN
Start: 2014-08-20 — End: 2014-08-21

## 2014-08-20 MED ORDER — SODIUM CHLORIDE 0.9 % IJ SOLN: 3.0000 mL | INTRAMUSCULAR | Status: DC | PRN

## 2014-08-20 MED ORDER — OXYCODONE-ACETAMINOPHEN 5-325 MG PO TABS
2.0000 | ORAL_TABLET | Freq: Once | ORAL | Status: AC
  Administered 2014-08-20: 2 via ORAL
  Filled 2014-08-20: qty 2

## 2014-08-20 MED ORDER — SODIUM CHLORIDE 0.9 % IV SOLN: 250.0000 mL | INTRAVENOUS | Status: DC | PRN

## 2014-08-20 MED ORDER — FENTANYL CITRATE 0.05 MG/ML IJ SOLN
50.0000 ug | Freq: Once | INTRAMUSCULAR | Status: AC
  Administered 2014-08-20: 50 ug via INTRAVENOUS
  Filled 2014-08-20: qty 2

## 2014-08-20 MED ORDER — METHYLPREDNISOLONE SODIUM SUCC 125 MG IJ SOLR
60.0000 mg | Freq: Two times a day (BID) | INTRAMUSCULAR | Status: DC
  Administered 2014-08-20 – 2014-08-21 (×2): 60 mg via INTRAVENOUS
  Filled 2014-08-20 (×2): qty 2

## 2014-08-20 MED ORDER — IPRATROPIUM-ALBUTEROL 0.5-2.5 (3) MG/3ML IN SOLN
3.0000 mL | RESPIRATORY_TRACT | Status: DC
  Administered 2014-08-20 – 2014-08-21 (×5): 3 mL via RESPIRATORY_TRACT
  Filled 2014-08-20 (×4): qty 3

## 2014-08-20 MED ORDER — LEVOFLOXACIN 500 MG PO TABS
500.0000 mg | ORAL_TABLET | Freq: Every day | ORAL | Status: DC
  Administered 2014-08-21: 500 mg via ORAL
  Filled 2014-08-20: qty 1

## 2014-08-20 MED ORDER — SODIUM CHLORIDE 0.9 % IJ SOLN: 3.0000 mL | Freq: Two times a day (BID) | INTRAMUSCULAR | Status: DC

## 2014-08-20 MED ORDER — ENOXAPARIN SODIUM 40 MG/0.4ML ~~LOC~~ SOLN
40.0000 mg | SUBCUTANEOUS | Status: DC
  Administered 2014-08-20: 40 mg via SUBCUTANEOUS
  Filled 2014-08-20: qty 0.4

## 2014-08-20 MED ORDER — ALBUTEROL SULFATE (2.5 MG/3ML) 0.083% IN NEBU: 2.5000 mg | INHALATION_SOLUTION | RESPIRATORY_TRACT | Status: DC | PRN

## 2014-08-20 MED ORDER — IPRATROPIUM-ALBUTEROL 0.5-2.5 (3) MG/3ML IN SOLN
3.0000 mL | RESPIRATORY_TRACT | Status: DC
  Filled 2014-08-20: qty 3

## 2014-08-20 MED ORDER — HYDROMORPHONE HCL 2 MG PO TABS
2.0000 mg | ORAL_TABLET | ORAL | Status: DC | PRN
  Administered 2014-08-20 – 2014-08-21 (×5): 2 mg via ORAL
  Filled 2014-08-20 (×5): qty 1

## 2014-08-20 MED ORDER — SODIUM CHLORIDE 0.9 % IJ SOLN
3.0000 mL | Freq: Two times a day (BID) | INTRAMUSCULAR | Status: DC
  Administered 2014-08-20 – 2014-08-21 (×2): 3 mL via INTRAVENOUS

## 2014-08-20 MED ORDER — METHYLPREDNISOLONE SODIUM SUCC 125 MG IJ SOLR
125.0000 mg | Freq: Once | INTRAMUSCULAR | Status: AC
  Administered 2014-08-20: 125 mg via INTRAVENOUS
  Filled 2014-08-20: qty 2

## 2014-08-20 MED ORDER — LEVOFLOXACIN IN D5W 750 MG/150ML IV SOLN
750.0000 mg | Freq: Once | INTRAVENOUS | Status: AC
  Administered 2014-08-20: 750 mg via INTRAVENOUS
  Filled 2014-08-20: qty 150

## 2014-08-20 MED ORDER — LORAZEPAM 2 MG/ML IJ SOLN
1.0000 mg | Freq: Once | INTRAMUSCULAR | Status: AC
  Administered 2014-08-20: 1 mg via INTRAVENOUS
  Filled 2014-08-20: qty 1

## 2014-08-20 MED ORDER — NICOTINE 14 MG/24HR TD PT24
14.0000 mg | MEDICATED_PATCH | Freq: Every day | TRANSDERMAL | Status: DC
  Filled 2014-08-20: qty 1

## 2014-08-20 MED ORDER — HYDROCODONE-ACETAMINOPHEN 5-325 MG PO TABS: 1.0000 | ORAL_TABLET | ORAL | Status: DC | PRN

## 2014-08-20 MED ORDER — IPRATROPIUM BROMIDE 0.02 % IN SOLN
0.5000 mg | Freq: Once | RESPIRATORY_TRACT | Status: AC
Start: 2014-08-20 — End: 2014-08-20
  Administered 2014-08-20: 0.5 mg via RESPIRATORY_TRACT
  Filled 2014-08-20: qty 2.5

## 2014-08-20 MED ORDER — FENTANYL CITRATE 0.05 MG/ML IJ SOLN: 50.0000 ug | Freq: Once | INTRAMUSCULAR | Status: DC

## 2014-08-20 NOTE — ED Notes (Signed)
Lab at bedside

## 2014-08-20 NOTE — ED Notes (Signed)
Pt requesting something to drink. Pt made aware his was not to have anything to eat or drink at present

## 2014-08-20 NOTE — H&P (Signed)
History and Physical  [name] Carter YSA:[id number] DOB: 28-Aug-1960 DOA: 08/20/2014  Referring physician: Dr. Regenia Skeeter in ED PCP: Alphia Kava   Chief Complaint: short of breath  HPI:  54 year old man PMH COPD, smoker presented to ED with acute shortness of breath. Found have respiratory distress and placed on BiPAP in the emergency department. Referred for admission for COPD exacerbation.  Patient well known to the health system with recurrent COPD exacerbations and ongoing smoking. He has had extensive investigation in the past and laboratory studies have been unrevealing. Most recently evaluated by pulmonology in [place] in December. He reports he has yet to follow up with the primary care physician and he did not keep follow-up arranged with pulmonology as an outpatient. He has been utilizing inhaler. He reports increasing shortness of breath over the last several days which became severe today. He has had chest pain associated with cough. No fever or systemic symptoms. He continues to smoke approximately 1 pack per day.  In the emergency department initially respiratory distress, treated BiPAP with marked improvement. Treated with bronchodilators and steroids, antibiotics. ABG 7.35/45/106 on BiPAP. Basic metabolic panel and CBC unremarkable. Troponin and lactic acid within normal limits. Chest x-ray per radiology no acute disease. Question left base infiltrate versus atelectasis.    Review of Systems:  Negative for fever, visual changes, sore throat, rash, new muscle aches, dysuria, bleeding, n/v/abdominal pain.  Positive for chest pain with cough, chronic left neck pain.   Past Medical History  Diagnosis Date  . COPD (chronic obstructive pulmonary disease)   . Pneumonia   . Chronic back pain   . DDD (degenerative disc disease), lumbar   . DDD (degenerative disc disease), cervical   . Neck pain     Past Surgical History  Procedure Laterality Date  . Back  surgery      low back x4  . Bilateral carpal tunnel release    . Ulnar nerve transposition      ? by description, not clear  . Bilteral knee surgery      Social History:  reports that he has been smoking Cigarettes.  He has been smoking about 1.00 pack per day. He does not have any smokeless tobacco history on file. He reports that he does not drink alcohol or use illicit drugs.  Allergies  Allergen Reactions  . Vicodin [Hydrocodone-Acetaminophen] Nausea And Vomiting and Other (See Comments)    Made patient feel funny, stomach pain    Family History  Problem Relation Age of Onset  . Heart attack Father      Prior to Admission medications   Medication Sig Start Date End Date Taking? Authorizing Provider  albuterol (PROVENTIL HFA;VENTOLIN HFA) 108 (90 BASE) MCG/ACT inhaler Inhale 2 puffs into the lungs every 4 (four) hours as needed for wheezing or shortness of breath. 06/14/14  Yes Samuella Cota, MD  albuterol (PROVENTIL) (2.5 MG/3ML) 0.083% nebulizer solution Take 3 mLs (2.5 mg total) by nebulization every 4 (four) hours as needed for wheezing or shortness of breath. 05/16/14  Yes Samuella Cota, MD  Ipratropium-Albuterol (COMBIVENT) 20-100 MCG/ACT AERS respimat Inhale 1 puff into the lungs 4 (four) times daily. 06/14/14  Yes Samuella Cota, MD  promethazine (PHENERGAN) 12.5 MG tablet 1 po q6h prn nausea Patient taking differently: Take 12.5 mg by mouth every 6 (six) hours as needed for nausea. 1 po q6h prn nausea 06/22/14  Yes Lenox Ahr, PA-C  acetaminophen-codeine (TYLENOL #3) 300-30 MG per tablet Take  1-2 tablets by mouth every 6 (six) hours as needed. Patient taking differently: Take 1-2 tablets by mouth every 6 (six) hours as needed for moderate pain.  06/22/14   Lenox Ahr, PA-C  diazepam (VALIUM) 5 MG tablet Take 1 tablet (5 mg total) by mouth every 6 (six) hours as needed for muscle spasms. Patient not taking: Reported on 08/20/2014 06/26/14   Jasper Riling.  Pickering, MD  levofloxacin (LEVAQUIN) 500 MG tablet Take 1 tablet (500 mg total) by mouth daily. Patient not taking: Reported on 08/20/2014 07/01/14   Velta Addison Mikhail, DO  nicotine (NICODERM CQ - DOSED IN MG/24 HOURS) 14 mg/24hr patch Place 1 patch (14 mg total) onto the skin daily. Patient not taking: Reported on 08/20/2014 07/01/14   Velta Addison Mikhail, DO  oxyCODONE-acetaminophen (PERCOCET) 10-325 MG per tablet Take 1 tablet by mouth every 4 (four) hours as needed for pain. Patient not taking: Reported on 08/20/2014 07/01/14   Velta Addison Mikhail, DO  predniSONE (STERAPRED UNI-PAK) 10 MG tablet Take by mouth daily. Prednisone dosing: Take  Prednisone 81m (4 tabs) x 3 days, then taper to 349m(3 tabs) x 3 days, then 2044m2 tabs) x 3days, then 63m67m tab) x 3days, then OFF. Patient not taking: Reported on 08/20/2014 07/01/14   MaryCristal Ford  Spacer/Aero-Holding Chambers (AEROCHAMBER PLUS WITH MASK) inhaler Use as instructed Patient not taking: Reported on 06/22/2014 05/05/14   Sam Orlie Dakin   Physical Exam: Filed Vitals:   08/20/14 0900 08/20/14 0930 08/20/14 1000 08/20/14 1030  BP: 122/86 126/84 124/87 127/94  Pulse: 79 77 80 82  Temp:      TempSrc:      Resp: _0 Height:      Weight:      SpO2: 97% 98% 96% 96%    General: examined in ED. Appears calm and comfortable. Not on BiPAP. Speaks in full sentences. Voluble.  Eyes: PERRL, normal lids, irises  ENT: grossly normal hearing, lips & tongue Neck: no LAD, masses or thyromegaly Cardiovascular: RRR, no m/r/g. No LE edema. Respiratory: diffuse wheezes bilateral especially posterior. Mild increased respiratory effort. Abdomen: soft, ntnd Skin: no rash or induration seen. Multiple tattoos. Musculoskeletal: grossly normal tone BUE/BLE Psychiatric: grossly normal mood and affect, speech fluent and appropriate Neurologic: grossly non-focal.  Wt Readings from Last 3 Encounters:  08/20/14 82.555 kg (182 lb)  06/27/14  82.555 kg (182 lb)  06/22/14 82.555 kg (182 lb)    Labs on Admission:  Basic Metabolic Panel:  Recent Labs Lab 08/20/14 0740  NA 140  K 3.6  CL 105  CO2 28  GLUCOSE 100*  BUN 14  CREATININE 0.97  CALCIUM 9.4    CBC:  Recent Labs Lab 08/20/14 0740  WBC 9.1  NEUTROABS 4.8  HGB 15.2  HCT 44.8  MCV 91.2  PLT 232    Cardiac Enzymes:  Recent Labs Lab 08/20/14 0740  TROPONINI <0.03     Recent Labs  04/24/14 1144 05/13/14 1352  PROBNP 45.3 120.7    Radiological Exams on Admission: Dg Chest Portable 1 View  08/20/2014   CLINICAL DATA:  Respiratory distress  EXAM: PORTABLE CHEST - 1 VIEW  COMPARISON:  June 29, 2014  FINDINGS: There is no edema or consolidation. Heart is borderline enlarged with pulmonary vascularity within normal limits. No adenopathy. No bone lesions.  IMPRESSION: Heart borderline enlarged.  No edema or consolidation.   Electronically Signed   By: WillLowella Grip M.D.   On: 08/20/2014  07:54    EKG: Independently reviewed. ST, no acute changes   Principal Problem:   COPD exacerbation Active Problems:   Acute respiratory failure   Tobacco dependence   Chronic pain   Assessment/Plan 1. Acute respiratory failure without documented hypoxia. 2. COPD exacerbation. 3. Tobacco dependence, smokes 1 ppd. I recommended smoking cessation. 4. Chronic neck pain. Followed by neurosurgery as an outpatient.  Labs from 05/2014: HIV nonreactive, respiratory virus panel negative. ANA, c-ANCA, p-ANCA, atypical p-ANCA, Rheumatoid factor negative. ESR 9.   Much improved, appears stable off BiPAP. Plan admit to SDU  BiPAP PRN  Steroids, nebs, oxygen, abx  Pulm consultation  Nicotine patch.  Code Status: full code DVT prophylaxis: Lovenox Family Communication: none present Disposition Plan/Anticipated LOS: admit 2-4 days  Time spent: 50 minutes  Murray Hodgkins, MD  [facility] Pager [phone] 08/20/2014, 10:46 AM

## 2014-08-20 NOTE — ED Notes (Signed)
Admitting at bedside 

## 2014-08-20 NOTE — ED Notes (Signed)
MD at bedside. 

## 2014-08-20 NOTE — ED Provider Notes (Signed)
CSN: [id number]     Arrival date & time 08/20/14  4098 History  This chart was scribed for [name], MD by Tonye Royalty, ED Scribe. This patient was seen in room APA18/APA18 and the patient's care was started at 7:35 AM.    Chief Complaint  Patient presents with  . Respiratory Distress   The history is provided by the patient. No language interpreter was used.    HPI Comments: [name] Carter is a 54 y.o. male with history of COPD who presents to the Emergency Department complaining of cough with onset 4 days ago with worsening SOB yesterday. He has history of SOB and pneumonia. He states he had pneumonia twice in December and states current symptoms are similar. He notes he has been on BIPAP twice in the past 3 months and was intubated twice last year. He notes that he is scheduled for neck surgery soon and states the cough causes pain to his neck and back.   Past Medical History  Diagnosis Date  . COPD (chronic obstructive pulmonary disease)   . Pneumonia   . Chronic back pain   . DDD (degenerative disc disease), lumbar   . DDD (degenerative disc disease), cervical   . Neck pain    Past Surgical History  Procedure Laterality Date  . Back surgery      low back x4  . Bilateral carpal tunnel release    . Ulnar nerve transposition      ? by description, not clear  . Bilteral knee surgery     Family History  Problem Relation Age of Onset  . Heart attack Father    History  Substance Use Topics  . Smoking status: Heavy Tobacco Smoker -- 1.00 packs/day    Types: Cigarettes  . Smokeless tobacco: Not on file  . Alcohol Use: No    Review of Systems  Constitutional: Negative for fever.  Respiratory: Positive for cough and shortness of breath.   Cardiovascular: Positive for chest pain. Negative for leg swelling.  Musculoskeletal: Positive for back pain and neck pain.  All other systems reviewed and are negative.     Allergies  Vicodin  Home Medications   Prior to  Admission medications   Medication Sig Start Date End Date Taking? Authorizing Provider  acetaminophen-codeine (TYLENOL #3) 300-30 MG per tablet Take 1-2 tablets by mouth every 6 (six) hours as needed. Patient taking differently: Take 1-2 tablets by mouth every 6 (six) hours as needed for moderate pain.  06/22/14   Kathie Dike, PA-C  albuterol (PROVENTIL HFA;VENTOLIN HFA) 108 (90 BASE) MCG/ACT inhaler Inhale 2 puffs into the lungs every 4 (four) hours as needed for wheezing or shortness of breath. 06/14/14   Standley Brooking, MD  albuterol (PROVENTIL) (2.5 MG/3ML) 0.083% nebulizer solution Take 3 mLs (2.5 mg total) by nebulization every 4 (four) hours as needed for wheezing or shortness of breath. 05/16/14   Standley Brooking, MD  diazepam (VALIUM) 5 MG tablet Take 1 tablet (5 mg total) by mouth every 6 (six) hours as needed for muscle spasms. 06/26/14   Juliet Rude. Pickering, MD  Ipratropium-Albuterol (COMBIVENT) 20-100 MCG/ACT AERS respimat Inhale 1 puff into the lungs 4 (four) times daily. 06/14/14   Standley Brooking, MD  levofloxacin (LEVAQUIN) 500 MG tablet Take 1 tablet (500 mg total) by mouth daily. 07/01/14   Maryann Mikhail, DO  nicotine (NICODERM CQ - DOSED IN MG/24 HOURS) 14 mg/24hr patch Place 1 patch (14 mg total) onto the  skin daily. 07/01/14   Maryann Mikhail, DO  oxyCODONE-acetaminophen (PERCOCET) 10-325 MG per tablet Take 1 tablet by mouth every 4 (four) hours as needed for pain. 07/01/14   Maryann Mikhail, DO  predniSONE (STERAPRED UNI-PAK) 10 MG tablet Take by mouth daily. Prednisone dosing: Take  Prednisone  (4 tabs) x 3 days, then taper to  (3 tabs) x 3 days, then  (2 tabs) x 3days, then  (1 tab) x 3days, then OFF. 07/01/14   Maryann Mikhail, DO  promethazine (PHENERGAN) 12.5 MG tablet 1 po q6h prn nausea Patient taking differently: Take 12.5 mg by mouth every 6 (six) hours as needed for nausea. 1 po q6h prn nausea 06/22/14   Kathie Dike, PA-C   Spacer/Aero-Holding Chambers (AEROCHAMBER PLUS WITH MASK) inhaler Use as instructed Patient not taking: Reported on 06/22/2014 05/05/14   Doug Sou, MD   There were no vitals taken for this visit. Physical Exam  Constitutional: He is oriented to person, place, and time. He appears well-developed and well-nourished.  HENT:  Head: Normocephalic and atraumatic.  Eyes: Conjunctivae are normal.  Neck: Normal range of motion. Neck supple.  Cardiovascular: Regular rhythm.  Tachycardia present.   No murmur heard. Pulmonary/Chest: Effort normal. He has wheezes.  Diffuse expiratory wheezing Increased work of breathing with accessory muscle use   Musculoskeletal: Normal range of motion.  Neurological: He is alert and oriented to person, place, and time.  Skin: Skin is warm and dry.  Psychiatric: He has a normal mood and affect.  Nursing note and vitals reviewed.   ED Course  Procedures (including critical care time)  DIAGNOSTIC STUDIES: Oxygen Saturation is 94% on nasal canula, adequate by my interpretation.    COORDINATION OF CARE: 7:38 AM Discussed treatment plan with patient at beside, the patient agrees with the plan and has no further questions at this time.   Labs Review Labs Reviewed  BASIC METABOLIC PANEL - Abnormal; Notable for the following:    Glucose, Bld 100 (*)    All other components within normal limits  BLOOD GAS, ARTERIAL - Abnormal; Notable for the following:    pCO2 arterial 45.9 (*)    pO2, Arterial 106.0 (*)    Bicarbonate 25.1 (*)    All other components within normal limits  MRSA PCR SCREENING  CBC WITH DIFFERENTIAL/PLATELET  TROPONIN I  I-STAT CG4 LACTIC ACID, ED  TYPE AND SCREEN    Imaging Review Dg Chest Portable 1 View  08/20/2014   CLINICAL DATA:  Respiratory distress  EXAM: PORTABLE CHEST - 1 VIEW  COMPARISON:  June 29, 2014  FINDINGS: There is no edema or consolidation. Heart is borderline enlarged with pulmonary vascularity within  normal limits. No adenopathy. No bone lesions.  IMPRESSION: Heart borderline enlarged.  No edema or consolidation.   Electronically Signed   By: Bretta Bang III M.D.   On: 08/20/2014 07:54     EKG Interpretation   Date/Time:  Thursday August 20 2014 07:35:39 EST Ventricular Rate:  106 PR Interval:  148 QRS Duration: 101 QT Interval:  356 QTC Calculation: 473 R Axis:   -64 Text Interpretation:  Sinus tachycardia Left anterior fascicular block  Abnormal R-wave progression, late transition Artifact in lead(s) II III  aVF V3 V4 limited evaluation due to artifact Confirmed by Kenley Troop  MD,  Calvary Difranco ([id number]) on 08/20/2014 7:50:49 AM      CRITICAL CARE Performed by: Pricilla Loveless T   Total critical care time: 30 minutes  Critical care time was exclusive  of separately billable procedures and treating other patients.  Critical care was necessary to treat or prevent imminent or life-threatening deterioration.  Critical care was time spent personally by me on the following activities: development of treatment plan with patient and/or surrogate as well as nursing, discussions with consultants, evaluation of patient's response to treatment, examination of patient, obtaining history from patient or surrogate, ordering and performing treatments and interventions, ordering and review of laboratory studies, ordering and review of radiographic studies, pulse oximetry and re-evaluation of patient's condition.  MDM   Final diagnoses:  COPD with acute exacerbation    Patient initially looks very ill with respiratory distress and diffuse wheezing. Patient improved slowly with BiPAP, continuous albuterol, and Solu-Medrol. After a couple hours on BiPAP was able to be taken off and will be admitted to the hospitalist for COPD exacerbation. He continues to have severe chronic neck pain and pain from coughing but does not appear to have any indication of ACS. I do not shortness of breath is from PE or  pneumonia given clear x-ray and history of COPD exacerbations. Discussed with the hospitalist, will admit, they're requesting to add on Levaquin for COPD exacerbation.  I personally performed the services described in this documentation, which was scribed in my presence. The recorded information has been reviewed and is accurate.   Audree CamelScott T Cletus Paris, MD 08/20/14 [phone]

## 2014-08-20 NOTE — ED Notes (Signed)
Pt reports SOB x 4 days.  C/O chest pain and back pain.  Reports has had pneumonia multiple times since Dec.

## 2014-08-21 MED ORDER — IPRATROPIUM-ALBUTEROL 20-100 MCG/ACT IN AERS: 1.0000 | INHALATION_SPRAY | Freq: Four times a day (QID) | RESPIRATORY_TRACT | Status: DC

## 2014-08-21 MED ORDER — ALBUTEROL SULFATE HFA 108 (90 BASE) MCG/ACT IN AERS: 2.0000 | INHALATION_SPRAY | RESPIRATORY_TRACT | Status: AC | PRN

## 2014-08-21 MED ORDER — PREDNISONE 20 MG PO TABS: 40.0000 mg | ORAL_TABLET | Freq: Every day | ORAL | Status: DC

## 2014-08-21 MED ORDER — OXYCODONE-ACETAMINOPHEN 10-325 MG PO TABS
1.0000 | ORAL_TABLET | Freq: Four times a day (QID) | ORAL | Status: DC | PRN
Start: 2014-08-21 — End: 2016-09-19

## 2014-08-21 MED ORDER — PREDNISONE 10 MG PO TABS: ORAL_TABLET | ORAL | Status: DC

## 2014-08-21 MED ORDER — LEVOFLOXACIN 500 MG PO TABS: 500.0000 mg | ORAL_TABLET | Freq: Every day | ORAL | Status: DC

## 2014-08-21 NOTE — Care Management Note (Signed)
    Page 1 of 1   08/21/2014     3:35:30 PM CARE MANAGEMENT NOTE 08/21/2014  Patient:  [name] Carter,[name]   Account Number:  [id number]  Date Initiated:  08/21/2014  Documentation initiated by:  Kathyrn SheriffHILDRESS,JESSICA  Subjective/Objective Assessment:   Pt admitted for dyspnea. Pt is from home, lives with wife and independent with ADL's. Pt has a neb machine but no other DME's, med needs or HH services. Pt has recently become insured through Medicaid. Pt has appointments with cardiology.     Action/Plan:   Pt says he is being followed by a surgeon in [place] for an upcomming neck surgery. Pt says he is currently working to get PCP appointmnet at an office in [place]. Pt says he has no CM needs at the time of discharge.   Anticipated DC Date:  08/21/2014   Anticipated DC Plan:  HOME/SELF CARE      DC Planning Services  CM consult      Choice offered to / List presented to:             Status of service:  Completed, signed off Medicare Important Message given?   (If response is "NO", the following Medicare IM given date fields will be blank) Date Medicare IM given:   Medicare IM given by:   Date Additional Medicare IM given:   Additional Medicare IM given by:    Discharge Disposition:  HOME/SELF CARE  Per UR Regulation:  Reviewed for med. necessity/level of care/duration of stay  If discussed at Long Length of Stay Meetings, dates discussed:    Comments:  08/21/2014 1530 Kathyrn SheriffJessica Childress, RN, MSN, CM

## 2014-08-21 NOTE — Progress Notes (Signed)
  PROGRESS NOTE  [name] Carter [id number] DOB: 1961/06/29 DOA: 08/20/2014 PCP: [name]  Summary: 54 year old man PMH COPD, smoker presented to ED with acute shortness of breath. Found have respiratory distress and placed on BiPAP in the emergency department. Referred for admission for COPD exacerbation.  Assessment/Plan: 1. Acute respiratory failure without documented hypoxia, resolved, secondary to COPD exacerbation. 2. COPD exacerbation, clinically resolved.  3. Tobacco dependence, smokes 1 pack per day. Again I recommended cessation. 4. Chronic neck pain. Followed by neurosurgery as an outpatient.   Much better, acute respiratory issues resolved. COPD at baseline.  Home today on steroid taper, Levaquin; continue bronchodilators.  Brendia Sacksaniel Goodrich, MD  [facility]  Pager [phone] If 7PM-7AM, please contact night-coverage at [email or web address], password [email or web address] 08/21/2014, 8:54 AM  LOS: 1 day   Consultants:  Pulmonology   Procedures:    Antibiotics:  Levaquin 2/18 >> 2/22  HPI/Subjective: Feels much better, breathing better, chronic pain stable, no new issues. Wants to go home.  Objective: Filed Vitals:   08/21/14 0500 08/21/14 0700 08/21/14 0733 08/21/14 0800  BP:  104/66  110/67  Pulse:  63  54  Temp:  97.6 F (36.4 C)    TempSrc:  Oral    Resp:  23  13  Height:      Weight: 79.3 kg (174 lb 13.2 oz)     SpO2:  95% 95% 96%    Intake/Output Summary (Last 24 hours) at 08/21/14 0854 Last data filed at 08/21/14 0400  Gross per 24 hour  Intake   1200 ml  Output   2500 ml  Net  -1300 ml     Filed Weights   08/20/14 0736 08/20/14 1511 08/21/14 0500  Weight: 82.555 kg (182 lb) 79.1 kg (174 lb 6.1 oz) 79.3 kg (174 lb 13.2 oz)    Exam:     Afebrile, VSS General:  Appears calm and comfortable, lying flat, voluble speech Cardiovascular: RRR, no m/r/g.  Telemetry: SR, no arrhythmias  Respiratory: fair air movement, some wheezes,  normal respiratory effort. Speaks in paragraphs. No rhonchi or rales noted. Psychiatric: grossly normal mood and affect, speech fluent and appropriate Neurologic: grossly non-focal.  Data Reviewed:  No new data  Scheduled Meds: . enoxaparin (LOVENOX) injection  40 mg Subcutaneous Q24H  . ipratropium-albuterol  3 mL Nebulization Q4H  . levofloxacin  500 mg Oral Daily  . methylPREDNISolone (SOLU-MEDROL) injection  60 mg Intravenous Q12H  . nicotine  14 mg Transdermal Daily  . sodium chloride  3 mL Intravenous Q12H  . sodium chloride  3 mL Intravenous Q12H   Continuous Infusions:   Principal Problem:   COPD exacerbation Active Problems:   Acute respiratory failure   Tobacco dependence   Chronic pain

## 2014-08-21 NOTE — Discharge Summary (Signed)
Physician Discharge Summary  [name] Carter [id number] DOB: December 21, 1960 DOA: 08/20/2014  PCP: [name]  Patient now has insurance; he was encouraged to establish with PCP of choice based on his insurance--he will pursue a [name]-based physician  Admit date: 08/20/2014 Discharge date: 08/21/2014  Recommendations for Outpatient Follow-up:  1. Further evaluation of COPD 2. Continue to encourage smoking cessation   Follow-up Information    Follow up with [name] Carter On 08/27/2014.   Specialty:  Pulmonology   Why:  10:30 am with Dr. Wynelle Carter information:   [address] [place] [address] [phone]       Discharge Diagnoses:  1. Acute respiratory failure without documented hypoxia 2. COPD exacerbation 3. Tobacco dependence, cigarette smoker  Discharge Condition: Improved Disposition: Home  Diet recommendation: Regular  Filed Weights   08/20/14 0736 08/20/14 1511 08/21/14 0500  Weight: 82.555 kg (182 lb) 79.1 kg (174 lb 6.1 oz) 79.3 kg (174 lb 13.2 oz)    History of present illness:  54 year old man PMH COPD, smoker presented to ED with acute shortness of breath. Found have respiratory distress and placed on BiPAP in the emergency department. Referred for admission for COPD exacerbation.  Hospital Course:  [name] Carter rapidly improved with standard treatment for COPD. No evidence of, getting features. He did not require BiPAP after the emergency department. Hospitalization was uncomplicated and he is now stable for discharge. I stressed smoking cessation with him. He is not interested in quitting at this point. He does not want to use a nicotine patch.  1. Acute respiratory failure without documented hypoxia, resolved, secondary to COPD exacerbation. 2. COPD exacerbation, clinically resolved.  3. Tobacco dependence, smokes 1 pack per day. Again I recommended cessation. 4. Chronic neck pain. Followed by  neurosurgery as an outpatient.   Home today on steroid taper, Levaquin; continue bronchodilators.  Consultants:  none  Procedures:    Antibiotics:  Levaquin 2/18 >> 2/22  Discharge Instructions  Discharge Instructions    Activity as tolerated - No restrictions    Complete by:  As directed      Diet general    Complete by:  As directed      Discharge instructions    Complete by:  As directed   Call your physician or seek immediate medical attention for wheezing, shortness of breath or worsening of condition.          Current Discharge Medication List    START taking these medications   Details  predniSONE (DELTASONE) 10 MG tablet Start 2/20 . Take 40 mg by mouth daily for 3 days, then take 20 mg by mouth daily for 3 days, then take 10 mg by mouth daily for 3 days, then stop. Qty: 21 tablet, Refills: 0      CONTINUE these medications which have CHANGED   Details  albuterol (PROVENTIL HFA;VENTOLIN HFA) 108 (90 BASE) MCG/ACT inhaler Inhale 2 puffs into the lungs every 4 (four) hours as needed for wheezing or shortness of breath. Qty: 1 Inhaler, Refills: 1    Ipratropium-Albuterol (COMBIVENT) 20-100 MCG/ACT AERS respimat Inhale 1 puff into the lungs 4 (four) times daily. Qty: 1 Inhaler, Refills: 1    levofloxacin (LEVAQUIN) 500 MG tablet Take 1 tablet (500 mg total) by mouth daily. Start 2/20 Qty: 3 tablet, Refills: 0      CONTINUE these medications which have NOT CHANGED   Details  albuterol (PROVENTIL) (2.5 MG/3ML) 0.083% nebulizer solution Take 3 mLs (  2.5 mg total) by nebulization every 4 (four) hours as needed for wheezing or shortness of breath.    promethazine (PHENERGAN) 12.5 MG tablet 1 po q6h prn nausea Qty: 12 tablet, Refills: 0    nicotine (NICODERM CQ - DOSED IN MG/24 HOURS) 14 mg/24hr patch Place 1 patch (14 mg total) onto the skin daily. Qty: 28 patch, Refills: 0    oxyCODONE-acetaminophen (PERCOCET) 10-325 MG per tablet Take 1 tablet by mouth  every 4 (four) hours as needed for pain. Qty: 30 tablet, Refills: 0    Spacer/Aero-Holding Chambers (AEROCHAMBER PLUS WITH MASK) inhaler Use as instructed Qty: 1 each, Refills: 2      STOP taking these medications     acetaminophen-codeine (TYLENOL #3) 300-30 MG per tablet      diazepam (VALIUM) 5 MG tablet      predniSONE (STERAPRED UNI-PAK) 10 MG tablet        Allergies  Allergen Reactions  . Vicodin [Hydrocodone-Acetaminophen] Nausea And Vomiting and Other (See Comments)    Made patient feel funny, stomach pain    The results of significant diagnostics from this hospitalization (including imaging, microbiology, ancillary and laboratory) are listed below for reference.    Significant Diagnostic Studies: Dg Chest Portable 1 View  08/20/2014   CLINICAL DATA:  Respiratory distress  EXAM: PORTABLE CHEST - 1 VIEW  COMPARISON:  June 29, 2014  FINDINGS: There is no edema or consolidation. Heart is borderline enlarged with pulmonary vascularity within normal limits. No adenopathy. No bone lesions.  IMPRESSION: Heart borderline enlarged.  No edema or consolidation.   Electronically Signed   By: Bretta BangWilliam  Woodruff III M.D.   On: 08/20/2014 07:54    Microbiology: Recent Results (from the past 240 hour(s))  MRSA PCR Screening     Status: None   Collection Time: 08/20/14  3:06 PM  Result Value Ref Range Status   MRSA by PCR NEGATIVE NEGATIVE Final    Comment:        The GeneXpert MRSA Assay (FDA approved for NASAL specimens only), is one component of a comprehensive MRSA colonization surveillance program. It is not intended to diagnose MRSA infection nor to guide or monitor treatment for MRSA infections.      Labs: Basic Metabolic Panel:  Recent Labs Lab 08/20/14 0740  NA 140  K 3.6  CL 105  CO2 28  GLUCOSE 100*  BUN 14  CREATININE 0.97  CALCIUM 9.4   CBC:  Recent Labs Lab 08/20/14 0740  WBC 9.1  NEUTROABS 4.8  HGB 15.2  HCT 44.8  MCV 91.2  PLT 232    Cardiac Enzymes:  Recent Labs Lab 08/20/14 0740  TROPONINI <0.03    Principal Problem:   COPD exacerbation Active Problems:   Acute respiratory failure   Tobacco dependence   Chronic pain   Time coordinating discharge: 35 minutes  Signed:  Brendia Sacksaniel Maysoon Lozada, MD [facility] 08/21/2014, 9:52 AM

## 2014-08-21 NOTE — Progress Notes (Signed)
D/c instructions reviewed with patient.  F/U appointment made with [facility] [place].  Patient given COPD Gold information per protocol, no cards available to give patient gold card.  Verbalized understanding.  Pt dc'd to home with family. Schonewitz, Candelaria StagersLeigh Anne 08/21/2014

## 2014-08-21 NOTE — Care Management Utilization Note (Signed)
UR completed 

## 2014-08-27 ENCOUNTER — Institutional Professional Consult (permissible substitution): Payer: Self-pay | Admitting: Internal Medicine

## 2014-08-28 ENCOUNTER — Other Ambulatory Visit: Payer: Self-pay | Admitting: Neurosurgery

## 2014-08-28 DIAGNOSIS — M5023 Other cervical disc displacement, cervicothoracic region: Secondary | ICD-10-CM

## 2014-09-02 ENCOUNTER — Ambulatory Visit
Admission: RE | Admit: 2014-09-02 | Discharge: 2014-09-02 | Disposition: A | Discharge status: Home / Self Care | Payer: Medicaid Other | Source: Ambulatory Visit | Attending: Neurosurgery | Admitting: Neurosurgery

## 2014-09-02 DIAGNOSIS — G8929 Other chronic pain: Secondary | ICD-10-CM

## 2014-09-02 DIAGNOSIS — M5023 Other cervical disc displacement, cervicothoracic region: Secondary | ICD-10-CM

## 2014-09-02 MED ORDER — ONDANSETRON HCL 4 MG/2ML IJ SOLN
4.0000 mg | Freq: Once | INTRAMUSCULAR | Status: AC
  Administered 2014-09-02: 4 mg via INTRAMUSCULAR

## 2014-09-02 MED ORDER — MEPERIDINE HCL 100 MG/ML IJ SOLN
100.0000 mg | Freq: Once | INTRAMUSCULAR | Status: AC
  Administered 2014-09-02: 100 mg via INTRAMUSCULAR

## 2014-09-02 MED ORDER — IOHEXOL 300 MG/ML  SOLN
10.0000 mL | Freq: Once | INTRAMUSCULAR | Status: AC | PRN
  Administered 2014-09-02: 10 mL via INTRA_ARTERIAL

## 2014-09-02 MED ORDER — DIAZEPAM 5 MG PO TABS
10.0000 mg | ORAL_TABLET | Freq: Once | ORAL | Status: AC
  Administered 2014-09-02: 10 mg via ORAL

## 2014-09-02 NOTE — Discharge Instructions (Signed)

## 2014-10-28 ENCOUNTER — Ambulatory Visit (HOSPITAL_COMMUNITY): Payer: Medicaid Other | Admitting: Physical Therapy

## 2014-10-29 ENCOUNTER — Ambulatory Visit (HOSPITAL_COMMUNITY): Payer: Medicaid Other | Attending: Anesthesiology | Admitting: Physical Therapy

## 2014-10-29 DIAGNOSIS — M542 Cervicalgia: Secondary | ICD-10-CM | POA: Diagnosis not present

## 2014-10-29 DIAGNOSIS — M5442 Lumbago with sciatica, left side: Secondary | ICD-10-CM | POA: Diagnosis not present

## 2014-10-29 DIAGNOSIS — G8929 Other chronic pain: Secondary | ICD-10-CM | POA: Diagnosis not present

## 2014-10-29 NOTE — Patient Instructions (Signed)
On Elbows (Prone)   Rise up on elbows as high as possible, keeping hips on floor. Hold ____ seconds. Repeat ____ times per set. Do ____ sets per session. Do ____ sessions per day.  http://orth.exer.us/92   Copyright  VHI. All rights reserved.  Knee-to-Chest Stretch: Unilateral   With hand behind right knee, pull knee in to chest until a comfortable stretch is felt in lower back and buttocks. Keep back relaxed. Hold ____ seconds. Repeat ____ times per set. Do ____ sets per session. Do ____ sessions per day.  http://orth.exer.us/126   Copyright  VHI. All rights reserved.  Hamstring Stretch: Active   Support behind right knee. Starting with knee bent, attempt to straighten knee until a comfortable stretch is felt in back of thigh. Hold ____ seconds. Repeat ____ times per set. Do ____ sets per session. Do ____ sessions per day.  http://orth.exer.us/158   Copyright  VHI. All rights reserved.  Isometric Abdominal   Lying on back with knees bent, tighten stomach by pressing elbows down. Hold ____ seconds. Repeat ____ times per set. Do ____ sets per session. Do ____ sessions per day.  http://orth.exer.us/1086   Copyright  VHI. All rights reserved.  Bent Leg Lift (Hook-Lying)   Tighten stomach and slowly raise right leg ____ inches from floor. Keep trunk rigid. Hold ____ seconds. Repeat ____ times per set. Do ____ sets per session. Do ____ sessions per day.  http://orth.exer.us/1090   Copyright  VHI. All rights reserved.  Bridging   Slowly raise buttocks from floor, keeping stomach tight. Repeat ____ times per set. Do ____ sets per session. Do ____ sessions per day.  http://orth.exer.us/1096   Copyright  VHI. All rights reserved.  Heel Squeeze (Prone)   Abdomen supported, bend knees and gently squeeze heels together. Hold ____ seconds. Repeat ____ times per set. Do ____ sets per session. Do ____ sessions per day.  http://orth.exer.us/1080   Copyright  VHI.  All rights reserved.  On Elbows (Prone)   Rise up on elbows as high as possible, keeping hips on floor. Hold ____ seconds. Repeat ____ times per set. Do ____ sets per session. Do ____ sessions per day.  http://orth.exer.us/92   C

## 2014-10-29 NOTE — Therapy (Signed)
[facility] [address] [place], [place], [address] Phone: [phone]   Fax:  [phone]  Physical Therapy Evaluation  Patient Details  Name: [name] StallsLeo Carter MRN: [id number] Date of Birth: Apr 23, 1961 Referring Provider:  Berdie OgrenGordon, Michael Elijah *  Encounter Date: 10/29/2014      PT End of Session - 10/29/14 1604    Visit Number 1   Number of Visits 1   Authorization Type medicaid      Past Medical History  Diagnosis Date  . COPD (chronic obstructive pulmonary disease)   . Pneumonia   . Chronic back pain   . DDD (degenerative disc disease), lumbar   . DDD (degenerative disc disease), cervical   . Neck pain     Past Surgical History  Procedure Laterality Date  . Back surgery      low back x4  . Bilateral carpal tunnel release    . Ulnar nerve transposition      ? by description, not clear  . Bilteral knee surgery      There were no vitals filed for this visit.  Visit Diagnosis:  Neck pain of over 3 months duration  Bilateral low back pain with left-sided sciatica      Subjective Assessment - 10/29/14 1527    Subjective Pt states that his back is bothering him the most today but he has both neck and back pain.  He has pain going down both arms into his hands.and down his Lt leg to the knee.  He has had 8 nerve blocks in his back.    Pertinent History 4 back surgeries with the last being a fusion in 1992.    How long can you sit comfortably? unable to sit in comfortt.    How long can you stand comfortably? 5-10 mintues    How long can you walk comfortably? pt does not try to walk at all due to pain    Currently in Pain? Yes   Pain Score 8    Pain Location Neck   Pain Orientation Lower   Pain Descriptors / Indicators Aching;Burning   Pain Type Chronic pain   Pain Onset More than a month ago  October 2016   Aggravating Factors  coghing, sneezing    Pain Relieving Factors rest   Multiple Pain Sites Yes   Pain  Score 10   Pain Location Back   Pain Orientation Lower;Right;Left   Pain Descriptors / Indicators Aching;Burning   Pain Type Chronic pain   Pain Onset More than a month ago   Pain Frequency Constant   Aggravating Factors  activity             [facility] PT Assessment - 10/29/14 0001    Assessment   Medical Diagnosis acute lumbar/cervical pain    Onset Date --  1992   Prior Therapy 1992   Precautions   Precautions None   Required Braces or Orthoses Spinal Brace   Spinal Brace Lumbar corset  wears prn   Balance Screen   Has the patient fallen in the past 6 months No   Has the patient had a decrease in activity level because of a fear of falling?  Yes   Is the patient reluctant to leave their home because of a fear of falling?  No   Home Environment   Living Enviornment Private residence   Living Arrangements Spouse/significant other   Type of Home Mobile home   Home Access Stairs to enter  Entrance Stairs-Number of Steps 2   Home Layout One level   Prior Function   Level of Independence Needs assistance with homemaking   Vocation Unemployed   Leisure none   Cognition   Overall Cognitive Status Within Functional Limits for tasks assessed   ROM / Strength   AROM / PROM / Strength Strength   AROM   Lumbar Flexion decreased 50% with pain upon returning    Lumbar Extension decreased  50%   Strength   Right Hip Flexion 5/5   Right Hip Extension 5/5   Left Hip Flexion 5/5   Left Hip Extension 5/5   Right/Left Knee Right;Left   Right Knee Flexion 5/5   Right Knee Extension 5/5   Left Knee Flexion 5/5   Left Knee Extension 5/5                   [facility] Adult PT Treatment/Exercise - 10/29/14 0001    Exercises   Exercises Lumbar   Lumbar Exercises: Stretches   Active Hamstring Stretch 3 reps;30 seconds   Single Knee to Chest Stretch 2 reps;30 seconds   Lower Trunk Rotation 5 reps   Prone on Elbows Stretch 60 seconds   Lumbar Exercises: Supine   Ab Set 5  reps   Bent Knee Raise 5 reps   Dead Bug 5 reps   Other Supine Lumbar Exercises decompression ex 1-5                PT Education - 10/29/14 1601    Education provided Yes   Education Details HEP   Person(s) Educated Patient   Methods Explanation;Handout   Comprehension Verbalized understanding;Returned demonstration          PT Short Term Goals - 10/29/14 1606    PT SHORT TERM GOAL #1   Title I HEP                  Plan - 10/29/14 1604    Clinical Impression Statement [name] Carter is a 54 yo male who has chronic neck and back pain.  He has been referred to therapy for evaluation and treatment.  Evaluation shows decreased ROM, decreased spinal stabliity and increased pain.  [name] Carter insurance will only cover an evaluation therfore he was instructed, completed and was given a HEP to work on his own.     PT Next Visit Plan discharge.    PT Home Exercise Plan given for stretching and stability   Consulted and Agree with Plan of Care Patient         Problem List Patient Active Problem List   Diagnosis Date Noted  . COPD exacerbation 08/20/2014  . COPD with acute exacerbation 06/27/2014  . Chronic pain 06/27/2014  . Acute respiratory failure with hypoxia 06/13/2014  . CAP (community acquired pneumonia) 05/16/2014  . Chest pain   . Acute respiratory failure 05/13/2014  . Tobacco dependence 05/13/2014   Virgina Organ, PT CLT [phone] 10/29/2014, 4:08 PM  [facility] [address] [place], [place], [address] Phone: [phone]   Fax:  [phone]

## 2015-01-01 ENCOUNTER — Encounter: Payer: Self-pay | Admitting: Internal Medicine

## 2015-01-26 ENCOUNTER — Ambulatory Visit: Payer: Medicaid Other | Admitting: Gastroenterology

## 2015-01-28 ENCOUNTER — Ambulatory Visit: Payer: Medicaid Other | Admitting: Gastroenterology

## 2015-02-12 ENCOUNTER — Ambulatory Visit (INDEPENDENT_AMBULATORY_CARE_PROVIDER_SITE_OTHER): Payer: Medicaid Other | Admitting: Gastroenterology

## 2015-02-12 ENCOUNTER — Encounter: Payer: Self-pay | Admitting: Gastroenterology

## 2015-02-12 VITALS — BP 123/84 | HR 78 | Temp 96.6°F | Ht 69.0 in | Wt 174.4 lb

## 2015-02-12 DIAGNOSIS — K2970 Gastritis, unspecified, without bleeding: Secondary | ICD-10-CM | POA: Diagnosis not present

## 2015-02-12 DIAGNOSIS — R1013 Epigastric pain: Secondary | ICD-10-CM

## 2015-02-12 DIAGNOSIS — K2990 Gastroduodenitis, unspecified, without bleeding: Secondary | ICD-10-CM

## 2015-02-12 DIAGNOSIS — R1032 Left lower quadrant pain: Secondary | ICD-10-CM | POA: Diagnosis not present

## 2015-02-12 DIAGNOSIS — K5900 Constipation, unspecified: Secondary | ICD-10-CM | POA: Diagnosis not present

## 2015-02-12 MED ORDER — PANTOPRAZOLE SODIUM 40 MG PO TBEC: 40.0000 mg | DELAYED_RELEASE_TABLET | Freq: Every day | ORAL | Status: DC

## 2015-02-12 MED ORDER — LINACLOTIDE 145 MCG PO CAPS: 145.0000 ug | ORAL_CAPSULE | Freq: Every day | ORAL | Status: DC

## 2015-02-12 NOTE — Progress Notes (Signed)
Primary Care Physician:  [name]  Primary Gastroenterologist:  Roetta Sessions, MD   Chief Complaint  Patient presents with  . Gastrophageal Reflux    HPI:  [name] Carter is a 54 y.o. male here for new patient consultation for further evaluation of GERD at the request of Augustine Radar, FNP-C. Patient with h/o chronic gastritis and intestinal metaplasia on bx in 2014 per PCP notes. 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 Random colon bx negative. Tubular adenoma removed.   Off Nexium for two years. Has back on for two months. Stomach swells. Pulls on back. Burns in upper abdomen. Certain foods make it worse. Severe LLQ into groin, intermittent for few months. No fever. No dysuria. Always constipation, with pain medication. Takes Miralax when real bound up. Causes LLQ pain. Takes miralax if no BM in three days. No melena, brbpr. Really dark stools, one two weeks ago.   Current Outpatient Prescriptions  Medication Sig Dispense Refill  . albuterol (PROVENTIL HFA;VENTOLIN HFA) 108 (90 BASE) MCG/ACT inhaler Inhale 2 puffs into the lungs every 4 (four) hours as needed for wheezing or shortness of breath. 1 Inhaler 1  . albuterol (PROVENTIL) (2.5 MG/3ML) 0.083% nebulizer solution Take 3 mLs (2.5 mg total) by nebulization every 4 (four) hours as needed for wheezing or shortness of breath.    . cyclobenzaprine (FLEXERIL) 10 MG tablet Take 10 mg by mouth at bedtime.    . DULoxetine (CYMBALTA) 30 MG capsule Take 30 mg by mouth 2 (two) times daily.    Marland Kitchen esomeprazole (NEXIUM) 40 MG capsule Take 40 mg by mouth daily before breakfast.    . Ipratropium-Albuterol (COMBIVENT) 20-100 MCG/ACT AERS respimat Inhale 1 puff into the lungs 4 (four) times daily. 1 Inhaler 1  . oxyCODONE-acetaminophen (PERCOCET) 10-325 MG per tablet Take 1 tablet by mouth every 6 (six) hours as needed (severe pain). 20 tablet 0  . promethazine (PHENERGAN) 12.5 MG tablet 1 po q6h prn nausea  (Patient taking differently: Take 12.5 mg by mouth every 6 (six) hours as needed for nausea. 1 po q6h prn nausea) 12 tablet 0  . Spacer/Aero-Holding Chambers (AEROCHAMBER PLUS WITH MASK) inhaler Use as instructed 1 each 2  . traZODone (DESYREL) 100 MG tablet Take 100 mg by mouth at bedtime.     No current facility-administered medications for this visit.    Allergies as of 02/12/2015 - Review Complete 02/12/2015  Allergen Reaction Noted  . Vicodin [hydrocodone-acetaminophen] Nausea And Vomiting and Other (See Comments) 03/16/2014    Past Medical History  Diagnosis Date  . COPD (chronic obstructive pulmonary disease)   . Pneumonia   . Chronic back pain   . DDD (degenerative disc disease), lumbar   . DDD (degenerative disc disease), cervical   . Neck pain   . HTN (hypertension)     Past Surgical History  Procedure Laterality Date  . Back surgery      low back x4  . Bilateral carpal tunnel release    . Ulnar nerve transposition      ? by description, not clear  . Bilteral knee surgery      Family History  Problem Relation Age of Onset  . Heart attack Father   . Colon cancer Father     diagnosed in his 41s    Social History   Social History  . Marital Status: Married    Spouse Name: N/A  . Number of Children: 4  . Years of Education: N/A  Occupational History  . Not on file.   Social History Main Topics  . Smoking status: Heavy Tobacco Smoker -- 1.00 packs/day    Types: Cigarettes  . Smokeless tobacco: Not on file     Comment: one pack daily  . Alcohol Use: No  . Drug Use: No  . Sexual Activity: Yes    Birth Control/ Protection: None   Other Topics Concern  . Not on file   Social History Narrative      ROS:  General: Negative for anorexia, weight loss, fever, chills, fatigue, weakness. Eyes: Negative for vision changes.  ENT: Negative for hoarseness, difficulty swallowing , nasal congestion. CV: Negative for chest pain, angina, palpitations,  dyspnea on exertion, peripheral edema.  Respiratory: Negative for dyspnea at rest, dyspnea on exertion, cough, sputum, wheezing.  GI: See history of present illness. GU:  Negative for dysuria, hematuria, urinary incontinence, urinary frequency, nocturnal urination.  MS: Negative for joint pain, low back pain.  Derm: Negative for rash or itching.  Neuro: Negative for weakness, abnormal sensation, seizure, frequent headaches, memory loss, confusion.  Psych: Negative for anxiety, depression, suicidal ideation, hallucinations.  Endo: Negative for unusual weight change.  Heme: Negative for bruising or bleeding. Allergy: Negative for rash or hives.    Physical Examination:  BP 123/84 mmHg  Pulse 78  Temp(Src) 96.6 F (35.9 C) (Oral)  Ht 5\' 9"  (1.753 m)  Wt 174 lb 6.4 oz (79.107 kg)  BMI 25.74 kg/m2   General: Well-nourished, well-developed in no acute distress.  Head: Normocephalic, atraumatic.   Eyes: Conjunctiva pink, no icterus. Mouth: Oropharyngeal mucosa moist and pink , no lesions erythema or exudate. Neck: Supple without thyromegaly, masses, or lymphadenopathy.  Lungs: Clear to auscultation bilaterally.  Heart: Regular rate and rhythm, no murmurs rubs or gallops.  Abdomen: Bowel sounds are normal, moderate epigastric and LLQ tenderness, nondistended, no hepatosplenomegaly or masses, no abdominal bruits or    hernia , no rebound or guarding.   Rectal: not performed Extremities: No lower extremity edema. No clubbing or deformities.  Neuro: Alert and oriented x 4 , grossly normal neurologically.  Skin: Warm and dry, no rash or jaundice.   Psych: Alert and cooperative, normal mood and affect.  Labs: Lab Results  Component Value Date   WBC 9.1 08/20/2014   HGB 15.2 08/20/2014   HCT 44.8 08/20/2014   MCV 91.2 08/20/2014   PLT 232 08/20/2014   Lab Results  Component Value Date   CREATININE 0.97 08/20/2014   BUN 14 08/20/2014   NA 140 08/20/2014   K 3.6 08/20/2014   CL  105 08/20/2014   CO2 28 08/20/2014   Lab Results  Component Value Date   ALT 19 06/28/2014   AST 16 06/28/2014   ALKPHOS 49 06/28/2014   BILITOT 0.5 06/28/2014     Imaging Studies: No results found.  Impression/Plan:  54 y/o male with chronic GERD with inadequate control now back on PPI. Associated with epigastric burning. LLQ pain noted worse with constipation. Significant tenderness on exam. ?PUD, ?diverticulitis. Given severity of pain, recommend CT A/P with contrast for further evaluation.   Retrieve copy of prior EGD/TCS. Stop Nexium, start pantoprazole.   Not mentioned above, patient has had recent diarrhea, typically has constipation. Thinks he has a viral infection. If no resolution, he will let me know. If constipation resumes, then start Linzess.

## 2015-02-12 NOTE — Patient Instructions (Signed)
1. Please have your labs and CT scan done. 2. I will review records when available and make further recommendations.  3. Stop Nexium. Start pantoprazole one daily before breakfast. 4. Once your diarrhea resolves, start Linzess one daily before breakfast for constipation.  5. If your diarrhea doesn't resolve by next week, please call me.

## 2015-02-17 ENCOUNTER — Other Ambulatory Visit (HOSPITAL_COMMUNITY): Payer: Medicaid Other

## 2015-02-18 ENCOUNTER — Encounter: Payer: Self-pay | Admitting: Gastroenterology

## 2015-02-19 NOTE — Progress Notes (Signed)
cc'ed to pcp °

## 2015-02-23 ENCOUNTER — Ambulatory Visit (HOSPITAL_COMMUNITY): Payer: Medicaid Other

## 2015-03-01 ENCOUNTER — Institutional Professional Consult (permissible substitution): Payer: Medicaid Other | Admitting: Internal Medicine

## 2015-03-02 ENCOUNTER — Ambulatory Visit (HOSPITAL_COMMUNITY): Admission: RE | Admit: 2015-03-02 | Payer: Medicaid Other | Source: Ambulatory Visit

## 2015-03-05 ENCOUNTER — Ambulatory Visit (HOSPITAL_COMMUNITY)
Admission: RE | Admit: 2015-03-05 | Discharge: 2015-03-05 | Disposition: A | Discharge status: Home / Self Care | Payer: Medicaid Other | Source: Ambulatory Visit | Attending: Gastroenterology | Admitting: Gastroenterology

## 2015-03-05 DIAGNOSIS — K5900 Constipation, unspecified: Secondary | ICD-10-CM | POA: Insufficient documentation

## 2015-03-05 DIAGNOSIS — R1032 Left lower quadrant pain: Secondary | ICD-10-CM

## 2015-03-05 DIAGNOSIS — I10 Essential (primary) hypertension: Secondary | ICD-10-CM | POA: Diagnosis not present

## 2015-03-05 DIAGNOSIS — K2950 Unspecified chronic gastritis without bleeding: Secondary | ICD-10-CM | POA: Insufficient documentation

## 2015-03-05 DIAGNOSIS — N281 Cyst of kidney, acquired: Secondary | ICD-10-CM | POA: Insufficient documentation

## 2015-03-05 DIAGNOSIS — K4090 Unilateral inguinal hernia, without obstruction or gangrene, not specified as recurrent: Secondary | ICD-10-CM | POA: Diagnosis not present

## 2015-03-05 DIAGNOSIS — R1013 Epigastric pain: Secondary | ICD-10-CM | POA: Insufficient documentation

## 2015-03-05 DIAGNOSIS — J449 Chronic obstructive pulmonary disease, unspecified: Secondary | ICD-10-CM | POA: Diagnosis not present

## 2015-03-05 DIAGNOSIS — K2990 Gastroduodenitis, unspecified, without bleeding: Secondary | ICD-10-CM

## 2015-03-05 DIAGNOSIS — K2970 Gastritis, unspecified, without bleeding: Secondary | ICD-10-CM

## 2015-03-05 MED ORDER — IOHEXOL 300 MG/ML  SOLN
100.0000 mL | Freq: Once | INTRAMUSCULAR | Status: AC | PRN
  Administered 2015-03-05: 100 mL via INTRAVENOUS

## 2015-03-09 ENCOUNTER — Institutional Professional Consult (permissible substitution): Payer: Medicaid Other | Admitting: Internal Medicine

## 2015-03-26 ENCOUNTER — Encounter: Payer: Self-pay | Admitting: Gastroenterology

## 2015-03-26 NOTE — Progress Notes (Signed)
Reviewed records of previous colonoscopy and EGD done in [place] in January 2014. Past surgical history updated. 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 Negative H pylori. No evidence of celiac disease. Duodenal mucosa with mild Brunner's gland hyperplasia which was nonspecific. On colonoscopy he had a rectal tubular adenoma removed, random colon biopsies were negative.  Patient returning for follow-up for consideration of EGD given ongoing symptoms, CT findings (please see CT report) and history of gastritis with intestinal metaplasia.

## 2015-03-26 NOTE — Progress Notes (Signed)
Quick Note:  Please let patient know his CT showed bilateral renal cysts, fairly large one on the left (8X6X7cm) benign appearing. Right inguinal hernia. Stomach incompletely distended therefore not completely evaluated.  Patient needs a follow up OV. Likely needs EGD. (for ongoing symptoms mostly, and he does have h/o chronic gastritis and intestinal metaplasia). ______

## 2015-04-14 ENCOUNTER — Encounter: Payer: Self-pay | Admitting: Internal Medicine

## 2015-04-14 NOTE — Progress Notes (Signed)
APPT MADE AND LETTER SENT  °

## 2015-04-25 ENCOUNTER — Inpatient Hospital Stay (HOSPITAL_COMMUNITY)
Admission: EM | Admit: 2015-04-25 | Discharge: 2015-05-03 | DRG: 208 | Disposition: A | Discharge status: Home / Self Care | Payer: Medicaid Other | Attending: Internal Medicine | Admitting: Internal Medicine

## 2015-04-25 ENCOUNTER — Encounter (HOSPITAL_COMMUNITY): Payer: Self-pay | Admitting: Emergency Medicine

## 2015-04-25 ENCOUNTER — Emergency Department (HOSPITAL_COMMUNITY): Payer: Medicaid Other

## 2015-04-25 DIAGNOSIS — F17210 Nicotine dependence, cigarettes, uncomplicated: Secondary | ICD-10-CM | POA: Diagnosis present

## 2015-04-25 DIAGNOSIS — J9601 Acute respiratory failure with hypoxia: Secondary | ICD-10-CM | POA: Diagnosis present

## 2015-04-25 DIAGNOSIS — F419 Anxiety disorder, unspecified: Secondary | ICD-10-CM | POA: Diagnosis present

## 2015-04-25 DIAGNOSIS — J9621 Acute and chronic respiratory failure with hypoxia: Principal | ICD-10-CM | POA: Diagnosis present

## 2015-04-25 DIAGNOSIS — Z8249 Family history of ischemic heart disease and other diseases of the circulatory system: Secondary | ICD-10-CM

## 2015-04-25 DIAGNOSIS — J441 Chronic obstructive pulmonary disease with (acute) exacerbation: Secondary | ICD-10-CM | POA: Diagnosis present

## 2015-04-25 DIAGNOSIS — I10 Essential (primary) hypertension: Secondary | ICD-10-CM | POA: Diagnosis present

## 2015-04-25 DIAGNOSIS — G894 Chronic pain syndrome: Secondary | ICD-10-CM | POA: Diagnosis present

## 2015-04-25 DIAGNOSIS — R0602 Shortness of breath: Secondary | ICD-10-CM | POA: Diagnosis present

## 2015-04-25 DIAGNOSIS — F17200 Nicotine dependence, unspecified, uncomplicated: Secondary | ICD-10-CM | POA: Diagnosis present

## 2015-04-25 DIAGNOSIS — Z23 Encounter for immunization: Secondary | ICD-10-CM | POA: Diagnosis not present

## 2015-04-25 DIAGNOSIS — J189 Pneumonia, unspecified organism: Secondary | ICD-10-CM | POA: Diagnosis present

## 2015-04-25 DIAGNOSIS — Z79891 Long term (current) use of opiate analgesic: Secondary | ICD-10-CM | POA: Diagnosis not present

## 2015-04-25 DIAGNOSIS — J449 Chronic obstructive pulmonary disease, unspecified: Secondary | ICD-10-CM

## 2015-04-25 DIAGNOSIS — Z800 Family history of malignant neoplasm of digestive organs: Secondary | ICD-10-CM

## 2015-04-25 LAB — INFLUENZA PANEL BY PCR (TYPE A & B)
H1N1FLUPCR: NOT DETECTED
INFLAPCR: NEGATIVE
Influenza B By PCR: NEGATIVE

## 2015-04-25 LAB — URINALYSIS, ROUTINE W REFLEX MICROSCOPIC
BILIRUBIN URINE: NEGATIVE
Glucose, UA: NEGATIVE mg/dL
KETONES UR: NEGATIVE mg/dL
Leukocytes, UA: NEGATIVE
NITRITE: NEGATIVE
PH: 6 (ref 5.0–8.0)
Specific Gravity, Urine: >1.03 — ABNORMAL HIGH (ref 1.005–1.030)
UROBILINOGEN UA: 0.2 mg/dL (ref 0.0–1.0)

## 2015-04-25 LAB — BASIC METABOLIC PANEL
Anion gap: 9 (ref 5–15)
BUN: 11 mg/dL (ref 6–20)
CALCIUM: 8.8 mg/dL — AB (ref 8.9–10.3)
CO2: 24 mmol/L (ref 22–32)
Chloride: 105 mmol/L (ref 101–111)
Creatinine, Ser: 1.15 mg/dL (ref 0.61–1.24)
GFR calc Af Amer: >60 mL/min (ref >60)
Glucose, Bld: 90 mg/dL (ref 65–99)
Potassium: 3.7 mmol/L (ref 3.5–5.1)
SODIUM: 138 mmol/L (ref 135–145)

## 2015-04-25 LAB — CBC WITH DIFFERENTIAL/PLATELET
BASOS ABS: 0 10*3/uL (ref 0.0–0.1)
BASOS PCT: 0 %
EOS ABS: 0.1 10*3/uL (ref 0.0–0.7)
Eosinophils Relative: 1 %
HCT: 43 % (ref 39.0–52.0)
HEMOGLOBIN: 14.3 g/dL (ref 13.0–17.0)
Lymphocytes Relative: 11 %
Lymphs Abs: 1.3 10*3/uL (ref 0.7–4.0)
MCH: 30.8 pg (ref 26.0–34.0)
MCHC: 33.3 g/dL (ref 30.0–36.0)
MCV: 92.7 fL (ref 78.0–100.0)
MONO ABS: 0.9 10*3/uL (ref 0.1–1.0)
MONOS PCT: 8 %
NEUTROS PCT: 80 %
Neutro Abs: 9.1 10*3/uL — ABNORMAL HIGH (ref 1.7–7.7)
Platelets: 173 10*3/uL (ref 150–400)
RBC: 4.64 MIL/uL (ref 4.22–5.81)
RDW: 12.9 % (ref 11.5–15.5)
WBC: 11.4 10*3/uL — ABNORMAL HIGH (ref 4.0–10.5)

## 2015-04-25 LAB — BLOOD GAS, ARTERIAL
ACID-BASE DEFICIT: 2 mmol/L (ref 0.0–2.0)
BICARBONATE: 22.3 meq/L (ref 20.0–24.0)
FIO2: 50
MECHVT: 560 mL
O2 Saturation: 98.5 %
PEEP/CPAP: 5 cmH2O
PH ART: 7.319 — AB (ref 7.350–7.450)
RATE: 16 resp/min
pCO2 arterial: 46.5 mmHg — ABNORMAL HIGH (ref 35.0–45.0)
pO2, Arterial: 138 mmHg — ABNORMAL HIGH (ref 80.0–100.0)

## 2015-04-25 LAB — MRSA PCR SCREENING: MRSA BY PCR: NEGATIVE

## 2015-04-25 LAB — URINE MICROSCOPIC-ADD ON

## 2015-04-25 LAB — I-STAT CG4 LACTIC ACID, ED: LACTIC ACID, VENOUS: 1.61 mmol/L (ref 0.5–2.0)

## 2015-04-25 LAB — LACTIC ACID, PLASMA
LACTIC ACID, VENOUS: 1.8 mmol/L (ref 0.5–2.0)
Lactic Acid, Venous: 1.1 mmol/L (ref 0.5–2.0)

## 2015-04-25 LAB — TROPONIN I

## 2015-04-25 LAB — TRIGLYCERIDES: TRIGLYCERIDES: 79 mg/dL (ref <150)

## 2015-04-25 MED ORDER — PROPOFOL 1000 MG/100ML IV EMUL
INTRAVENOUS | Status: AC
  Administered 2015-04-25: 40 ug/kg/min via INTRAVENOUS
  Filled 2015-04-25: qty 100

## 2015-04-25 MED ORDER — MIDAZOLAM HCL 2 MG/2ML IJ SOLN
INTRAMUSCULAR | Status: AC
  Filled 2015-04-25: qty 2

## 2015-04-25 MED ORDER — ETOMIDATE 2 MG/ML IV SOLN
INTRAVENOUS | Status: AC
  Administered 2015-04-25: 20 mg
  Filled 2015-04-25: qty 20

## 2015-04-25 MED ORDER — SODIUM CHLORIDE 0.9 % IV SOLN
1000.0000 mL | INTRAVENOUS | Status: DC
  Administered 2015-04-25 – 2015-04-28 (×8): 1000 mL via INTRAVENOUS

## 2015-04-25 MED ORDER — PROPOFOL 1000 MG/100ML IV EMUL
INTRAVENOUS | Status: AC
  Administered 2015-04-25: 1000 mg
  Filled 2015-04-25: qty 100

## 2015-04-25 MED ORDER — PROPOFOL 1000 MG/100ML IV EMUL
5.0000 ug/kg/min | INTRAVENOUS | Status: DC
  Administered 2015-04-25: 60 ug/kg/min via INTRAVENOUS
  Administered 2015-04-25: 1000 mg via INTRAVENOUS
  Administered 2015-04-26: 70 ug/kg/min via INTRAVENOUS
  Administered 2015-04-26: 65 ug/kg/min via INTRAVENOUS
  Administered 2015-04-26: 70 ug/kg/min via INTRAVENOUS
  Administered 2015-04-26: 80 ug/kg/min via INTRAVENOUS
  Administered 2015-04-26: 70 ug/kg/min via INTRAVENOUS
  Administered 2015-04-26: 60 ug/kg/min via INTRAVENOUS
  Administered 2015-04-27: 80 ug/kg/min via INTRAVENOUS
  Administered 2015-04-27: 60 ug/kg/min via INTRAVENOUS
  Filled 2015-04-25 (×7): qty 100
  Filled 2015-04-25: qty 200
  Filled 2015-04-25 (×2): qty 100

## 2015-04-25 MED ORDER — ONDANSETRON HCL 4 MG/2ML IJ SOLN: 4.0000 mg | Freq: Three times a day (TID) | INTRAMUSCULAR | Status: AC | PRN

## 2015-04-25 MED ORDER — PROPOFOL 1000 MG/100ML IV EMUL
5.0000 ug/kg/min | Freq: Once | INTRAVENOUS | Status: AC
  Administered 2015-04-25: 40 ug/kg/min via INTRAVENOUS

## 2015-04-25 MED ORDER — FENTANYL CITRATE (PF) 100 MCG/2ML IJ SOLN
INTRAMUSCULAR | Status: AC
  Filled 2015-04-25: qty 2

## 2015-04-25 MED ORDER — ANTISEPTIC ORAL RINSE SOLUTION (CORINZ)
7.0000 mL | Freq: Four times a day (QID) | OROMUCOSAL | Status: DC
Start: 2015-04-26 — End: 2015-04-30
  Administered 2015-04-26 – 2015-04-29 (×11): 7 mL via OROMUCOSAL

## 2015-04-25 MED ORDER — METHYLPREDNISOLONE SODIUM SUCC 125 MG IJ SOLR
125.0000 mg | Freq: Once | INTRAMUSCULAR | Status: AC
  Administered 2015-04-25: 125 mg via INTRAVENOUS
  Filled 2015-04-25: qty 2

## 2015-04-25 MED ORDER — DEXTROSE 5 % IV SOLN
1.0000 g | INTRAVENOUS | Status: DC
  Administered 2015-04-26 – 2015-04-27 (×2): 1 g via INTRAVENOUS
  Filled 2015-04-25 (×2): qty 10

## 2015-04-25 MED ORDER — ALBUTEROL SULFATE (2.5 MG/3ML) 0.083% IN NEBU
2.5000 mg | INHALATION_SOLUTION | RESPIRATORY_TRACT | Status: AC
  Administered 2015-04-25 – 2015-04-26 (×3): 2.5 mg via RESPIRATORY_TRACT
  Filled 2015-04-25 (×3): qty 3

## 2015-04-25 MED ORDER — SUCCINYLCHOLINE CHLORIDE 20 MG/ML IJ SOLN
INTRAMUSCULAR | Status: AC
  Administered 2015-04-25: 100 mg
  Filled 2015-04-25: qty 1

## 2015-04-25 MED ORDER — IPRATROPIUM-ALBUTEROL 0.5-2.5 (3) MG/3ML IN SOLN
3.0000 mL | Freq: Once | RESPIRATORY_TRACT | Status: AC
  Administered 2015-04-25: 3 mL via RESPIRATORY_TRACT
  Filled 2015-04-25: qty 3

## 2015-04-25 MED ORDER — DEXTROSE 5 % IV SOLN: 1.0000 g | INTRAVENOUS | Status: DC

## 2015-04-25 MED ORDER — CHLORHEXIDINE GLUCONATE 0.12% ORAL RINSE (MEDLINE KIT)
15.0000 mL | Freq: Two times a day (BID) | OROMUCOSAL | Status: DC
  Administered 2015-04-25 – 2015-04-28 (×5): 15 mL via OROMUCOSAL

## 2015-04-25 MED ORDER — FENTANYL CITRATE (PF) 100 MCG/2ML IJ SOLN
100.0000 ug | Freq: Once | INTRAMUSCULAR | Status: AC
  Administered 2015-04-25: 100 ug via INTRAVENOUS

## 2015-04-25 MED ORDER — AZITHROMYCIN 500 MG IV SOLR
500.0000 mg | INTRAVENOUS | Status: DC
Start: 2015-04-25 — End: 2015-04-28
  Administered 2015-04-26 – 2015-04-27 (×2): 500 mg via INTRAVENOUS
  Filled 2015-04-25 (×2): qty 500

## 2015-04-25 MED ORDER — FAMOTIDINE IN NACL 20-0.9 MG/50ML-% IV SOLN
20.0000 mg | Freq: Two times a day (BID) | INTRAVENOUS | Status: DC
  Administered 2015-04-25 – 2015-04-29 (×9): 20 mg via INTRAVENOUS
  Filled 2015-04-25 (×16): qty 50

## 2015-04-25 MED ORDER — ROCURONIUM BROMIDE 50 MG/5ML IV SOLN
INTRAVENOUS | Status: AC
  Filled 2015-04-25: qty 2

## 2015-04-25 MED ORDER — FAMOTIDINE IN NACL 20-0.9 MG/50ML-% IV SOLN
INTRAVENOUS | Status: AC
  Filled 2015-04-25: qty 50

## 2015-04-25 MED ORDER — SODIUM CHLORIDE 0.9 % IV SOLN: INTRAVENOUS | Status: DC

## 2015-04-25 MED ORDER — DEXTROSE 5 % IV SOLN
1.0000 g | Freq: Once | INTRAVENOUS | Status: DC
  Administered 2015-04-25: 1 g via INTRAVENOUS
  Filled 2015-04-25: qty 10

## 2015-04-25 MED ORDER — ALBUTEROL SULFATE (2.5 MG/3ML) 0.083% IN NEBU
2.5000 mg | INHALATION_SOLUTION | Freq: Once | RESPIRATORY_TRACT | Status: AC
  Administered 2015-04-25: 2.5 mg via RESPIRATORY_TRACT
  Filled 2015-04-25: qty 3

## 2015-04-25 MED ORDER — ALBUTEROL (5 MG/ML) CONTINUOUS INHALATION SOLN: 10.0000 mg/h | INHALATION_SOLUTION | RESPIRATORY_TRACT | Status: DC

## 2015-04-25 MED ORDER — AZITHROMYCIN 500 MG IV SOLR: 500.0000 mg | INTRAVENOUS | Status: DC

## 2015-04-25 MED ORDER — SODIUM CHLORIDE 0.9 % IV SOLN
30.0000 ug/h | INTRAVENOUS | Status: DC
  Filled 2015-04-25: qty 50

## 2015-04-25 MED ORDER — METHYLPREDNISOLONE SODIUM SUCC 125 MG IJ SOLR
125.0000 mg | Freq: Four times a day (QID) | INTRAMUSCULAR | Status: DC
  Administered 2015-04-25 – 2015-04-29 (×14): 125 mg via INTRAVENOUS
  Filled 2015-04-25 (×15): qty 2

## 2015-04-25 MED ORDER — PROPOFOL 1000 MG/100ML IV EMUL
INTRAVENOUS | Status: AC
  Administered 2015-04-25: 1000 mg via INTRAVENOUS
  Filled 2015-04-25: qty 100

## 2015-04-25 MED ORDER — ENOXAPARIN SODIUM 40 MG/0.4ML ~~LOC~~ SOLN
40.0000 mg | SUBCUTANEOUS | Status: DC
  Administered 2015-04-25 – 2015-05-02 (×8): 40 mg via SUBCUTANEOUS
  Filled 2015-04-25 (×8): qty 0.4

## 2015-04-25 MED ORDER — FENTANYL CITRATE (PF) 2500 MCG/50ML IJ SOLN
INTRAMUSCULAR | Status: AC
Start: 2015-04-25 — End: 2015-04-25
  Filled 2015-04-25: qty 50

## 2015-04-25 MED ORDER — LIDOCAINE HCL (CARDIAC) 20 MG/ML IV SOLN
INTRAVENOUS | Status: AC
  Filled 2015-04-25: qty 5

## 2015-04-25 MED ORDER — DEXTROSE 5 % IV SOLN
500.0000 mg | Freq: Once | INTRAVENOUS | Status: DC
  Administered 2015-04-25: 500 mg via INTRAVENOUS
  Filled 2015-04-25: qty 500

## 2015-04-25 MED ORDER — MIDAZOLAM HCL 2 MG/2ML IJ SOLN: 2.0000 mg | Freq: Once | INTRAMUSCULAR | Status: DC

## 2015-04-25 NOTE — ED Notes (Signed)
Pt c/o SOB/cp/fever. Audible expiratory wheezing noted.

## 2015-04-25 NOTE — Progress Notes (Signed)
ANTIBIOTIC CONSULT NOTE - INITIAL  Pharmacy Consult for Azithromycin & Rocephin Indication: pneumonia  Allergies  Allergen Reactions  . Vicodin [Hydrocodone-Acetaminophen] Nausea And Vomiting and Other (See Comments)    Made patient feel funny, stomach pain    Patient Measurements: Height: 5\' 9"  (175.3 cm) Weight: 185 lb (83.915 kg) IBW/kg (Calculated) : 70.7 Adjusted Body Weight:   Vital Signs: Temp: 98 F (36.7 C) (10/23 1405) Temp Source: Oral (10/23 1405) BP: 142/82 mmHg (10/23 1405) Pulse Rate: 107 (10/23 1405) Intake/Output from previous day:   Intake/Output from this shift:    Labs: No results for input(s): WBC, HGB, PLT, LABCREA, CREATININE in the last 72 hours. CrCl cannot be calculated (Patient has no serum creatinine result on file.). No results for input(s): VANCOTROUGH, VANCOPEAK, VANCORANDOM, GENTTROUGH, GENTPEAK, GENTRANDOM, TOBRATROUGH, TOBRAPEAK, TOBRARND, AMIKACINPEAK, AMIKACINTROU, AMIKACIN in the last 72 hours.   Microbiology: No results found for this or any previous visit (from the past 720 hour(s)).  Medical History: Past Medical History  Diagnosis Date  . COPD (chronic obstructive pulmonary disease) (HCC)   . Pneumonia   . Chronic back pain   . DDD (degenerative disc disease), lumbar   . DDD (degenerative disc disease), cervical   . Neck pain   . HTN (hypertension)     Medications:  Scheduled:  . etomidate      . lidocaine (cardiac) 100 mg/815ml      . methylPREDNISolone (SOLU-MEDROL) injection  125 mg Intravenous Once  . rocuronium      . succinylcholine       Assessment: 54 yo male ED patient, complaint SOB, CP and fever. Audible expiratory wheezing. Ceftriaxone & Azithromycin for CAP  Goal of Therapy:  Eradicate infection  Plan:  Ceftriaxone 1 GM IV every 24 hours Azithromycin 500 mg IV every 24 hours Labs per protocol  Raquel JamesPittman, Alizey Noren Bennett 04/25/2015,2:49 PM

## 2015-04-25 NOTE — ED Provider Notes (Signed)
CSN: [id number]     Arrival date & time 04/25/15  1357 History   First MD Initiated Contact with Patient 04/25/15 1419     Chief Complaint  Patient presents with  . Shortness of Breath     (Consider location/radiation/quality/duration/timing/severity/associated sxs/prior Treatment) HPI Comments: The patient is a 54 year old male who presents in acute respiratory distress. He states that he is hurting all over from his chest to his head, has body aches but has been severely dyspneic over the course of the day today. On arrival the patient is hypoxic, he is wheezing in all lung fields, he is unable to speak in more than 2 word sentences. Level V caveat apply secondary to severe respiratory distress  Reportedly the patient has a history of being intubated twice in the past for severe respiratory distress, COPD and pneumonia  Patient is a 54 y.o. male presenting with shortness of breath. The history is provided by the patient.  Shortness of Breath   Past Medical History  Diagnosis Date  . COPD (chronic obstructive pulmonary disease) (HCC)   . Pneumonia   . Chronic back pain   . DDD (degenerative disc disease), lumbar   . DDD (degenerative disc disease), cervical   . Neck pain   . HTN (hypertension)    Past Surgical History  Procedure Laterality Date  . Back surgery      low back x4  . Bilateral carpal tunnel release    . Ulnar nerve transposition      ? by description, not clear  . Bilteral knee surgery    . Esophagogastroduodenoscopy  07/2012    [place] MD: MAC. Chronic gastritis and intestinal metaplasia, ?metaplastic atrophic gastritis, negative H.pylori, no celiac, duidnal mucosa with mild Brunner gland hyperplasia, nonspecific.   . Colonoscopy  07/2012    [place] MD: MAC. Random colon biopsies benign. Rectal polyp tubular adenoma.   Family History  Problem Relation Age of Onset  . Heart attack Father   . Colon cancer Father     diagnosed in his 1s   Social History   Substance Use Topics  . Smoking status: Heavy Tobacco Smoker -- 1.00 packs/day    Types: Cigarettes  . Smokeless tobacco: None     Comment: one pack daily  . Alcohol Use: No    Review of Systems  Unable to perform ROS: Severe respiratory distress  Respiratory: Positive for shortness of breath.   All other systems reviewed and are negative.     Allergies  Vicodin  Home Medications   Prior to Admission medications   Medication Sig Start Date End Date Taking? Authorizing Provider  albuterol (PROVENTIL HFA;VENTOLIN HFA) 108 (90 BASE) MCG/ACT inhaler Inhale 2 puffs into the lungs every 4 (four) hours as needed for wheezing or shortness of breath. 08/21/14  Yes Standley Brooking, MD  albuterol (PROVENTIL) (2.5 MG/3ML) 0.083% nebulizer solution Take 3 mLs (2.5 mg total) by nebulization every 4 (four) hours as needed for wheezing or shortness of breath. 05/16/14  Yes Standley Brooking, MD  cyclobenzaprine (FLEXERIL) 10 MG tablet Take 10 mg by mouth at bedtime.   Yes Historical Provider, MD  DULoxetine (CYMBALTA) 30 MG capsule Take 30 mg by mouth 2 (two) times daily.   Yes Historical Provider, MD  Ipratropium-Albuterol (COMBIVENT) 20-100 MCG/ACT AERS respimat Inhale 1 puff into the lungs 4 (four) times daily. 08/21/14  Yes Standley Brooking, MD  Linaclotide [facility]) 145 MCG CAPS capsule Take 1 capsule (145 mcg total) by mouth daily  before breakfast. For constipation 02/12/15  Yes Tiffany KocherLeslie S Lewis, PA-C  oxyCODONE-acetaminophen (PERCOCET) 10-325 MG per tablet Take 1 tablet by mouth every 6 (six) hours as needed (severe pain). 08/21/14  Yes Standley Brookinganiel P Goodrich, MD  pantoprazole (PROTONIX) 40 MG tablet Take 1 tablet (40 mg total) by mouth daily before breakfast. 02/12/15  Yes Tiffany KocherLeslie S Lewis, PA-C  promethazine (PHENERGAN) 12.5 MG tablet 1 po q6h prn nausea Patient taking differently: Take 12.5 mg by mouth every 6 (six) hours as needed for nausea. 1 po q6h prn nausea 06/22/14  Yes Ivery QualeHobson Bryant, PA-C   traZODone (DESYREL) 100 MG tablet Take 100 mg by mouth at bedtime.   Yes Historical Provider, MD  Spacer/Aero-Holding Chambers (AEROCHAMBER PLUS WITH MASK) inhaler Use as instructed 05/05/14   Doug SouSam Jacubowitz, MD   BP 104/59 mmHg  Pulse 65  Temp(Src) 97.3 F (36.3 C) (Axillary)  Resp 18  Ht 5\' 9"  (1.753 m)  Wt 186 lb 15.2 oz (84.8 kg)  BMI 27.60 kg/m2  SpO2 98% Physical Exam  Constitutional: He appears well-developed and well-nourished. He appears distressed.  HENT:  Head: Normocephalic and atraumatic.  Mouth/Throat: Oropharynx is clear and moist. No oropharyngeal exudate.  Eyes: Conjunctivae and EOM are normal. Pupils are equal, round, and reactive to light. Right eye exhibits no discharge. Left eye exhibits no discharge. No scleral icterus.  Neck: Normal range of motion. Neck supple. No JVD present. No thyromegaly present.  Cardiovascular: Regular rhythm, normal heart sounds and intact distal pulses.  Exam reveals no gallop and no friction rub.   No murmur heard. Tachycardic, strong pulses at the radial arteries, no JVD  Pulmonary/Chest: He is in respiratory distress. He has wheezes. He has rales.  , Prolonged expiratory phase, diffuse wheezing, diffuse rales  Abdominal: Soft. Bowel sounds are normal. He exhibits no distension and no mass. There is no tenderness.  Musculoskeletal: Normal range of motion. He exhibits no edema or tenderness.  Lymphadenopathy:    He has no cervical adenopathy.  Neurological: He is alert. Coordination normal.  Skin: Skin is warm and dry. No rash noted. No erythema.  Psychiatric: He has a normal mood and affect. His behavior is normal.  Nursing note and vitals reviewed.   ED Course  Procedures (including critical care time) Labs Review Labs Reviewed  CBC WITH DIFFERENTIAL/PLATELET - Abnormal; Notable for the following:    WBC 11.4 (*)    Neutro Abs 9.1 (*)    All other components within normal limits  BASIC METABOLIC PANEL - Abnormal; Notable  for the following:    Calcium 8.8 (*)    All other components within normal limits  URINALYSIS, ROUTINE W REFLEX MICROSCOPIC (NOT AT [facility]) - Abnormal; Notable for the following:    Specific Gravity, Urine >1.030 (*)    Hgb urine dipstick MODERATE (*)    Protein, ur TRACE (*)    All other components within normal limits  URINE MICROSCOPIC-ADD ON - Abnormal; Notable for the following:    Bacteria, UA FEW (*)    All other components within normal limits  COMPREHENSIVE METABOLIC PANEL - Abnormal; Notable for the following:    Glucose, Bld 164 (*)    Calcium 8.0 (*)    Total Protein 5.7 (*)    Albumin 3.1 (*)    All other components within normal limits  CBC - Abnormal; Notable for the following:    Hemoglobin 12.9 (*)    All other components within normal limits  BLOOD GAS, ARTERIAL - Abnormal;  Notable for the following:    pH, Arterial 7.341 (*)    Acid-base deficit 2.4 (*)    All other components within normal limits  BLOOD GAS, ARTERIAL - Abnormal; Notable for the following:    pH, Arterial 7.319 (*)    pCO2 arterial 46.5 (*)    pO2, Arterial 138.00 (*)    All other components within normal limits  BLOOD GAS, ARTERIAL - Abnormal; Notable for the following:    pO2, Arterial 62.6 (*)    All other components within normal limits  BASIC METABOLIC PANEL - Abnormal; Notable for the following:    Chloride 116 (*)    Glucose, Bld 146 (*)    Calcium 8.1 (*)    All other components within normal limits  CBC - Abnormal; Notable for the following:    WBC 10.7 (*)    RBC 4.11 (*)    Hemoglobin 12.5 (*)    HCT 38.7 (*)    All other components within normal limits  CULTURE, BLOOD (ROUTINE X 2)  CULTURE, BLOOD (ROUTINE X 2)  URINE CULTURE  MRSA PCR SCREENING  CULTURE, RESPIRATORY (NON-EXPECTORATED)  CULTURE, EXPECTORATED SPUTUM-ASSESSMENT  GRAM STAIN  TROPONIN I  INFLUENZA PANEL BY PCR (TYPE A & B, H1N1)  LACTIC ACID, PLASMA  LACTIC ACID, PLASMA  HIV ANTIBODY (ROUTINE TESTING)   STREP PNEUMONIAE URINARY ANTIGEN  TRIGLYCERIDES  LEGIONELLA PNEUMOPHILA SEROGP 1 UR AG  BLOOD GAS, ARTERIAL  I-STAT CG4 LACTIC ACID, ED  I-STAT CG4 LACTIC ACID, ED    Imaging Review Dg Chest Port 1 View  04/27/2015  CLINICAL DATA:  54 year old male with history of acute renal failure and hypoxemia. Evaluate endotracheal tube placement. EXAM: PORTABLE CHEST 1 VIEW COMPARISON:  Chest x-ray 04/25/2015. FINDINGS: An endotracheal tube is in place with tip 6.2 cm above the carina. A nasogastric tube is seen extending into the stomach, however, the tip of the nasogastric tube extends below the lower margin of the image. Lung volumes are normal. Persistent airspace consolidation in the left upper lobe appears very similar to the recent prior examination. Diffuse peribronchial cuffing. No pleural effusions. No definite cephalization of the pulmonary vasculature. Heart size is normal. Upper mediastinal contours are within normal limits. IMPRESSION: 1. Support apparatus, as above. 2. Persistent left upper lobe airspace consolidation concerning for pneumonia. 3. In addition, there is diffuse peribronchial cuffing, suggesting concurrent bronchitis. Electronically Signed   By: Trudie Reed M.D.   On: 04/27/2015 00:49   Dg Chest Port 1 View  04/25/2015  CLINICAL DATA:  ETT placement, OG tube placed EXAM: PORTABLE CHEST 1 VIEW COMPARISON:  04/25/2015 FINDINGS: Endotracheal tube terminates at the thoracic inlet. Patchy left upper lobe opacity, suspicious for pneumonia. Right upper lobe opacity is less conspicuous/ obscured on the current study. No pleural effusion or pneumothorax. Enteric tube courses into the stomach. IMPRESSION: Endotracheal tube terminates at the thoracic inlet. Enteric tube courses into the stomach. Patchy left upper lobe opacity, suspicious for pneumonia. Electronically Signed   By: Charline Bills M.D.   On: 04/25/2015 15:06   Dg Chest Portable 1 View  04/25/2015  CLINICAL DATA:   Shortness of breath, chest pain and fever. Wheezing. EXAM: PORTABLE CHEST 1 VIEW COMPARISON:  08/20/2014 FINDINGS: Lungs are adequately inflated demonstrate hazy opacification over the upper lobes bilaterally which may be due to infection. No evidence of effusion. Cardiomediastinal silhouette is within normal. Remainder the exam is unchanged. IMPRESSION: Mild hazy airspace density over the upper lobes bilaterally which may be due  to infection. Electronically Signed   By: Elberta Fortis M.D.   On: 04/25/2015 14:25   I have personally reviewed and evaluated these images and lab results as part of my medical decision-making.  ED ECG REPORT  I personally interpreted this EKG   Date: 04/27/2015   Rate: 107  Rhythm: sinus tachycardia  QRS Axis: left  Intervals: normal  ST/T Wave abnormalities: nonspecific T wave changes  Conduction Disutrbances:none  Narrative Interpretation:   Old EKG Reviewed: unchanged   MDM   Final diagnoses:  Acute respiratory failure with hypoxia (HCC)  COPD, severe (HCC)    The patient is in severe respiratory distress, vital signs are very abnormal with significant hypoxia, tachypnea, on continuous nebulizer with oxygen the patient continued to desaturate to the point where BiPAP was thought to be unhelpful and the patient would need intubation. This was discussed briefly with the patient was awake and alert enough to not that he would agree to this intervention. Chest x-ray reviewed and shows bilateral upper lobe infiltrates. This could be the cause of this pain, would also consider flu. He says he has had a high fever the last 2 days  The patient has had consistent respiratory distress, vital signs have improved significantly after intubation, post intubation x-ray shows that there is a left upper lobe infiltrate most likely. Discussed with hospitalist who will agree to admit the patient to the intensive care unit, holding orders requested.  Currently getting fentanyl,  Versed, propofol drip, fentanyl drip has been ordered  INTUBATION Performed by: Vida Roller  Required items: required blood products, implants, devices, and special equipment available Patient identity confirmed: provided demographic data and hospital-assigned identification number Time out: Immediately prior to procedure a "time out" was called to verify the correct patient, procedure, equipment, support staff and site/side marked as required.  Indications:  Hypoxic respiratory failure  Intubation method: Direct Laryngoscopy   Preoxygenation: BVM  Sedatives: 20mg  Etomidate Paralytic: 100mg  Succinylcholine  Tube Size: 8 cuffed  Post-procedure assessment: chest rise and ETCO2 monitor Breath sounds: equal and absent over the epigastrium Tube secured with: ETT holder Chest x-ray interpreted by radiologist and me.  Chest x-ray findings: endotracheal tube in appropriate position  Patient tolerated the procedure well with no immediate complications.    CRITICAL CARE Performed by: Vida Roller Total critical care time: 35 Critical care time was exclusive of separately billable procedures and treating other patients. Critical care was necessary to treat or prevent imminent or life-threatening deterioration. Critical care was time spent personally by me on the following activities: development of treatment plan with patient and/or surrogate as well as nursing, discussions with consultants, evaluation of patient's response to treatment, examination of patient, obtaining history from patient or surrogate, ordering and performing treatments and interventions, ordering and review of laboratory studies, ordering and review of radiographic studies, pulse oximetry and re-evaluation of patient's condition.     Eber Hong, MD 04/27/15 [phone]

## 2015-04-25 NOTE — H&P (Signed)
[facility] History and Physical  Andersen Iorio ZOX:[id number] DOB: [birth year] DOA: 04/25/2015  Referring physician: ER PCP: Rondel Baton   Chief Complaint: Respiratory distress  HPI: Jaylene Schrom is a 54 y.o. male  This is a 54 year old man, with ongoing tobacco abuse, who has COPD presents with acute respiratory distress. He has become severely dyspneic over the course of the day. He says that he has been aching all over. On arrival to the emergency room, he was extremely hypoxic, wheezing in all lung fields and unable to speak in more than 2 word sentences. He has been apparently intubated twice in the past for severe respiratory distress. The emergency room physician felt that he was in severe respiratory distress and the patient is now intubated and mechanically ventilated. Therefore, he is unable to give me any clear history whatsoever. Chest x-ray shows evidence of left upper lobe pneumonia. He is now being admitted for further management.   Review of Systems:  Unable to obtain review of systems secondary to the patient's medical state.  Past Medical History  Diagnosis Date  . COPD (chronic obstructive pulmonary disease) (HCC)   . Pneumonia   . Chronic back pain   . DDD (degenerative disc disease), lumbar   . DDD (degenerative disc disease), cervical   . Neck pain   . HTN (hypertension)    Past Surgical History  Procedure Laterality Date  . Back surgery      low back x4  . Bilateral carpal tunnel release    . Ulnar nerve transposition      ? by description, not clear  . Bilteral knee surgery    . Esophagogastroduodenoscopy  07/2012    [place] MD: MAC. Chronic gastritis and intestinal metaplasia, ?metaplastic atrophic gastritis, negative H.pylori, no celiac, duidnal mucosa with mild Brunner gland hyperplasia, nonspecific.   . Colonoscopy  07/2012    [place] MD: MAC. Random colon biopsies benign. Rectal polyp tubular adenoma.   Social History:   reports that he has been smoking Cigarettes.  He has been smoking about 1.00 pack per day. He does not have any smokeless tobacco history on file. He reports that he does not drink alcohol or use illicit drugs.  Allergies  Allergen Reactions  . Vicodin [Hydrocodone-Acetaminophen] Nausea And Vomiting and Other (See Comments)    Made patient feel funny, stomach pain    Family History  Problem Relation Age of Onset  . Heart attack Father   . Colon cancer Father     diagnosed in his 22s    Prior to Admission medications   Medication Sig Start Date End Date Taking? Authorizing Provider  albuterol (PROVENTIL HFA;VENTOLIN HFA) 108 (90 BASE) MCG/ACT inhaler Inhale 2 puffs into the lungs every 4 (four) hours as needed for wheezing or shortness of breath. 08/21/14  Yes Standley Brooking, MD  albuterol (PROVENTIL) (2.5 MG/3ML) 0.083% nebulizer solution Take 3 mLs (2.5 mg total) by nebulization every 4 (four) hours as needed for wheezing or shortness of breath. 05/16/14  Yes Standley Brooking, MD  cyclobenzaprine (FLEXERIL) 10 MG tablet Take 10 mg by mouth at bedtime.   Yes Historical Provider, MD  DULoxetine (CYMBALTA) 30 MG capsule Take 30 mg by mouth 2 (two) times daily.   Yes Historical Provider, MD  Ipratropium-Albuterol (COMBIVENT) 20-100 MCG/ACT AERS respimat Inhale 1 puff into the lungs 4 (four) times daily. 08/21/14  Yes Standley Brooking, MD  Linaclotide [facility]) 145 MCG CAPS capsule Take 1 capsule (145 mcg total)  by mouth daily before breakfast. For constipation 02/12/15  Yes Tiffany KocherLeslie S Lewis, PA-C  oxyCODONE-acetaminophen (PERCOCET) 10-325 MG per tablet Take 1 tablet by mouth every 6 (six) hours as needed (severe pain). 08/21/14  Yes Standley Brookinganiel P Goodrich, MD  pantoprazole (PROTONIX) 40 MG tablet Take 1 tablet (40 mg total) by mouth daily before breakfast. 02/12/15  Yes Tiffany KocherLeslie S Lewis, PA-C  promethazine (PHENERGAN) 12.5 MG tablet 1 po q6h prn nausea Patient taking differently: Take 12.5 mg by mouth  every 6 (six) hours as needed for nausea. 1 po q6h prn nausea 06/22/14  Yes Ivery QualeHobson Bryant, PA-C  traZODone (DESYREL) 100 MG tablet Take 100 mg by mouth at bedtime.   Yes Historical Provider, MD  Spacer/Aero-Holding Chambers (AEROCHAMBER PLUS WITH MASK) inhaler Use as instructed 05/05/14   Doug SouSam Jacubowitz, MD   Physical Exam: Filed Vitals:   04/25/15 1530 04/25/15 1545 04/25/15 1550 04/25/15 1600  BP: 108/69  91/59 92/65  Pulse: 83 82 81 80  Temp:      TempSrc:      Resp: 24 17 16 17   Height:      Weight:      SpO2: 98% 98% 98% 98%    Wt Readings from Last 3 Encounters:  04/25/15 83.915 kg (185 lb)  02/12/15 79.107 kg (174 lb 6.4 oz)  08/21/14 79.3 kg (174 lb 13.2 oz)    General: Patient is intubated and mechanically ventilated. Appears to be stable. There is no peripheral cyanosis. Peripheries are warm and he is hemodynamically stable. Eyes: PERRL, normal lids, irises & conjunctiva ENT: grossly normal hearing, lips & tongue Neck: no LAD, masses or thyromegaly Cardiovascular: RRR, no m/r/g. No LE edema. Telemetry: SR, no arrhythmias  Respiratory: Bilateral wheezing throughout. No bronchial breathing or crackles. Abdomen: soft, ntnd Skin: no rash or induration seen on limited exam Musculoskeletal: grossly normal tone BUE/BLE Psychiatric: Not examined. Neurologic: Not examined as patient is sedated.           Labs on Admission:  Basic Metabolic Panel:  Recent Labs Lab 04/25/15 1417  NA 138  K 3.7  CL 105  CO2 24  GLUCOSE 90  BUN 11  CREATININE 1.15  CALCIUM 8.8*   Liver Function Tests: No results for input(s): AST, ALT, ALKPHOS, BILITOT, PROT, ALBUMIN in the last 168 hours. No results for input(s): LIPASE, AMYLASE in the last 168 hours. No results for input(s): AMMONIA in the last 168 hours. CBC:  Recent Labs Lab 04/25/15 1417  WBC 11.4*  NEUTROABS 9.1*  HGB 14.3  HCT 43.0  MCV 92.7  PLT 173   Cardiac Enzymes:  Recent Labs Lab 04/25/15 1417    TROPONINI <0.03    BNP (last 3 results) No results for input(s): BNP in the last 8760 hours.  ProBNP (last 3 results)  Recent Labs  05/13/14 1352  PROBNP 120.7    CBG: No results for input(s): GLUCAP in the last 168 hours.  Radiological Exams on Admission: Dg Chest Port 1 View  04/25/2015  CLINICAL DATA:  ETT placement, OG tube placed EXAM: PORTABLE CHEST 1 VIEW COMPARISON:  04/25/2015 FINDINGS: Endotracheal tube terminates at the thoracic inlet. Patchy left upper lobe opacity, suspicious for pneumonia. Right upper lobe opacity is less conspicuous/ obscured on the current study. No pleural effusion or pneumothorax. Enteric tube courses into the stomach. IMPRESSION: Endotracheal tube terminates at the thoracic inlet. Enteric tube courses into the stomach. Patchy left upper lobe opacity, suspicious for pneumonia. Electronically Signed   By: Lurlean HornsSriyesh  Rito Ehrlich M.D.   On: 04/25/2015 15:06   Dg Chest Portable 1 View  04/25/2015  CLINICAL DATA:  Shortness of breath, chest pain and fever. Wheezing. EXAM: PORTABLE CHEST 1 VIEW COMPARISON:  08/20/2014 FINDINGS: Lungs are adequately inflated demonstrate hazy opacification over the upper lobes bilaterally which may be due to infection. No evidence of effusion. Cardiomediastinal silhouette is within normal. Remainder the exam is unchanged. IMPRESSION: Mild hazy airspace density over the upper lobes bilaterally which may be due to infection. Electronically Signed   By: Elberta Fortis M.D.   On: 04/25/2015 14:25      Assessment/Plan   1. Left upper lobe community-acquired pneumonia. He will be treated with antibiotics. 2. COPD exacerbation. IV steroids and bronchodilators. 3. Acute respiratory failure with hypoxia. He is intubated and mechanically ventilated. Pulmonary consultation.  He will be admitted to the intensive care unit. Further recommendations will depend on patient's hospital progress.  Code Status: Full code.  DVT Prophylaxis:  Lovenox.  Family Communication: No family members present at the bedside.   Disposition Plan: Home when medically stable  Time spent: 45 minutes.  Wilson Singer [facility] Pager [phone].

## 2015-04-25 NOTE — Progress Notes (Signed)
eLink Physician-Brief Progress Note Patient Name: [name] Carter DOB: 10/13/1960 MRN: [id number]   Date of Service  04/25/2015  HPI/Events of Note  Pt on ventilator  eICU Interventions  Ventilator protocol with  Stress ulcer prophylaxis     Intervention Category Intermediate Interventions: Best-practice therapies (e.g. DVT, beta blocker, etc.)  [name] Carter 04/25/2015, 5:51 PM

## 2015-04-26 LAB — BLOOD GAS, ARTERIAL
Acid-base deficit: 2.4 mmol/L — ABNORMAL HIGH (ref 0.0–2.0)
Bicarbonate: 22.1 meq/L (ref 20.0–24.0)
Drawn by: 317771
FIO2: 0.4
MECHVT: 560 mL
O2 Saturation: 95.1 %
PEEP: 5 cmH2O
RATE: 16 {breaths}/min
pCO2 arterial: 42.7 mmHg (ref 35.0–45.0)
pH, Arterial: 7.341 — ABNORMAL LOW (ref 7.350–7.450)
pO2, Arterial: 81.2 mmHg (ref 80.0–100.0)

## 2015-04-26 LAB — COMPREHENSIVE METABOLIC PANEL
ALBUMIN: 3.1 g/dL — AB (ref 3.5–5.0)
ALT: 17 U/L (ref 17–63)
ANION GAP: 6 (ref 5–15)
AST: 26 U/L (ref 15–41)
Alkaline Phosphatase: 64 U/L (ref 38–126)
BILIRUBIN TOTAL: 0.6 mg/dL (ref 0.3–1.2)
BUN: 12 mg/dL (ref 6–20)
CO2: 22 mmol/L (ref 22–32)
Calcium: 8 mg/dL — ABNORMAL LOW (ref 8.9–10.3)
Chloride: 110 mmol/L (ref 101–111)
Creatinine, Ser: 0.79 mg/dL (ref 0.61–1.24)
GFR calc Af Amer: >60 mL/min (ref >60)
GFR calc non Af Amer: >60 mL/min (ref >60)
GLUCOSE: 164 mg/dL — AB (ref 65–99)
POTASSIUM: 4.4 mmol/L (ref 3.5–5.1)
SODIUM: 138 mmol/L (ref 135–145)
TOTAL PROTEIN: 5.7 g/dL — AB (ref 6.5–8.1)

## 2015-04-26 LAB — CBC
HEMATOCRIT: 39.5 % (ref 39.0–52.0)
HEMOGLOBIN: 12.9 g/dL — AB (ref 13.0–17.0)
MCH: 30.4 pg (ref 26.0–34.0)
MCHC: 32.7 g/dL (ref 30.0–36.0)
MCV: 92.9 fL (ref 78.0–100.0)
Platelets: 151 10*3/uL (ref 150–400)
RBC: 4.25 MIL/uL (ref 4.22–5.81)
RDW: 13 % (ref 11.5–15.5)
WBC: 7.5 10*3/uL (ref 4.0–10.5)

## 2015-04-26 LAB — STREP PNEUMONIAE URINARY ANTIGEN: Strep Pneumo Urinary Antigen: NEGATIVE

## 2015-04-26 LAB — HIV ANTIBODY (ROUTINE TESTING W REFLEX): HIV SCREEN 4TH GENERATION: NONREACTIVE

## 2015-04-26 MED ORDER — ALBUTEROL SULFATE (2.5 MG/3ML) 0.083% IN NEBU
INHALATION_SOLUTION | RESPIRATORY_TRACT | Status: AC
  Filled 2015-04-26: qty 3

## 2015-04-26 MED ORDER — ALBUTEROL SULFATE (2.5 MG/3ML) 0.083% IN NEBU
2.5000 mg | INHALATION_SOLUTION | RESPIRATORY_TRACT | Status: DC
  Administered 2015-04-26 – 2015-04-27 (×7): 2.5 mg via RESPIRATORY_TRACT
  Filled 2015-04-26 (×7): qty 3

## 2015-04-26 NOTE — Progress Notes (Signed)
Consult requested by: [facility] Consult requested for acute on chronic respiratory failure:  HPI: This is a 54 year old with long-standing problem with COPD. He apparently been having trouble for several days but was concerned that if he came to the hospital he would end up being on the ventilator. He eventually got so short of breath he came to the hospital was found to be hypoxic wheezing and very short of breath. He was intubated in the emergency department and is really not able to give any history now. Chest x-ray showed left upper lobe pneumonia  Past Medical History  Diagnosis Date  . COPD (chronic obstructive pulmonary disease) (HCC)   . Pneumonia   . Chronic back pain   . DDD (degenerative disc disease), lumbar   . DDD (degenerative disc disease), cervical   . Neck pain   . HTN (hypertension)      Family History  Problem Relation Age of Onset  . Heart attack Father   . Colon cancer Father     diagnosed in his 3570s     Social History   Social History  . Marital Status: Married    Spouse Name: N/A  . Number of Children: 4  . Years of Education: N/A   Social History Main Topics  . Smoking status: Heavy Tobacco Smoker -- 1.00 packs/day    Types: Cigarettes  . Smokeless tobacco: None     Comment: one pack daily  . Alcohol Use: No  . Drug Use: No  . Sexual Activity: Yes    Birth Control/ Protection: None   Other Topics Concern  . None   Social History Narrative     ROS: Unable to obtain    Objective: Vital signs in last 24 hours: Temp:  [96.8 F (36 C)-100.2 F (37.9 C)] 96.8 F (36 C) (10/24 0815) Pulse Rate:  [62-107] 77 (10/24 0815) Resp:  [16-32] 29 (10/24 0815) BP: (90-142)/(53-82) 128/70 mmHg (10/24 0815) SpO2:  [90 %-100 %] 99 % (10/24 0815) FiO2 (%):  [40 %-98 %] 40 % (10/24 0808) Weight:  [82.3 kg (181 lb 7 oz)-83.915 kg (185 lb)] 83.3 kg (183 lb 10.3 oz) (10/24 0500) Weight change:  Last BM Date:  (unknown)  Intake/Output from  previous day: 10/23 0701 - 10/24 0700 In: 2409.8 [I.V.:2309.8; IV Piggyback:100] Out: 1700 [Urine:1550; Emesis/NG output:150]  PHYSICAL EXAM He is intubated sedated and on mechanical ventilation. His pupils react. Mucous membranes are dry. Neck is supple without masses. His chest shows wheezes bilaterally. His heart is regular without gallop. Abdomen is soft without masses and he does not have any edema. Central nervous system examination cannot really be assessed  Lab Results: Basic Metabolic Panel:  Recent Labs  40/98/1110/23/16 1417  NA 138  K 3.7  CL 105  CO2 24  GLUCOSE 90  BUN 11  CREATININE 1.15  CALCIUM 8.8*   Liver Function Tests: No results for input(s): AST, ALT, ALKPHOS, BILITOT, PROT, ALBUMIN in the last 72 hours. No results for input(s): LIPASE, AMYLASE in the last 72 hours. No results for input(s): AMMONIA in the last 72 hours. CBC:  Recent Labs  04/25/15 1417  WBC 11.4*  NEUTROABS 9.1*  HGB 14.3  HCT 43.0  MCV 92.7  PLT 173   Cardiac Enzymes:  Recent Labs  04/25/15 1417  TROPONINI <0.03   BNP: No results for input(s): PROBNP in the last 72 hours. D-Dimer: No results for input(s): DDIMER in the last 72 hours. CBG: No results for input(s): GLUCAP  in the last 72 hours. Hemoglobin A1C: No results for input(s): HGBA1C in the last 72 hours. Fasting Lipid Panel:  Recent Labs  04/25/15 2208  TRIG 79   Thyroid Function Tests: No results for input(s): TSH, T4TOTAL, FREET4, T3FREE, THYROIDAB in the last 72 hours. Anemia Panel: No results for input(s): VITAMINB12, FOLATE, FERRITIN, TIBC, IRON, RETICCTPCT in the last 72 hours. Coagulation: No results for input(s): LABPROT, INR in the last 72 hours. Urine Drug Screen: Drugs of Abuse     Component Value Date/Time   LABOPIA POSITIVE* 06/28/2014 0130   COCAINSCRNUR NONE DETECTED 06/28/2014 0130   LABBENZ POSITIVE* 06/28/2014 0130   AMPHETMU NONE DETECTED 06/28/2014 0130   THCU NONE DETECTED 06/28/2014  0130   LABBARB NONE DETECTED 06/28/2014 0130    Alcohol Level: No results for input(s): ETH in the last 72 hours. Urinalysis:  Recent Labs  04/25/15 1451  COLORURINE YELLOW  LABSPEC >1.030*  PHURINE 6.0  GLUCOSEU NEGATIVE  HGBUR MODERATE*  BILIRUBINUR NEGATIVE  KETONESUR NEGATIVE  PROTEINUR TRACE*  UROBILINOGEN 0.2  NITRITE NEGATIVE  LEUKOCYTESUR NEGATIVE   Misc. Labs:   ABGS:  Recent Labs  04/25/15 1710  PHART 7.319*  PO2ART 138.00*  HCO3 22.3     MICROBIOLOGY: Recent Results (from the past 240 hour(s))  Blood Culture (routine x 2)     Status: None (Preliminary result)   Collection Time: 04/25/15  2:51 PM  Result Value Ref Range Status   Specimen Description BLOOD RIGHT HAND  Final   Special Requests BOTTLES DRAWN AEROBIC AND ANAEROBIC 8CC EACH  Final   Culture NO GROWTH <12 HOURS  Final   Report Status PENDING  Incomplete  Blood Culture (routine x 2)     Status: None (Preliminary result)   Collection Time: 04/25/15  2:59 PM  Result Value Ref Range Status   Specimen Description BLOOD LEFT HAND  Final   Special Requests BOTTLES DRAWN AEROBIC AND ANAEROBIC 8CC EACH  Final   Culture NO GROWTH <12 HOURS  Final   Report Status PENDING  Incomplete  MRSA PCR Screening     Status: None   Collection Time: 04/25/15  5:35 PM  Result Value Ref Range Status   MRSA by PCR NEGATIVE NEGATIVE Final    Comment:        The GeneXpert MRSA Assay (FDA approved for NASAL specimens only), is one component of a comprehensive MRSA colonization surveillance program. It is not intended to diagnose MRSA infection nor to guide or monitor treatment for MRSA infections.     Studies/Results: Dg Chest Port 1 View  04/25/2015  CLINICAL DATA:  ETT placement, OG tube placed EXAM: PORTABLE CHEST 1 VIEW COMPARISON:  04/25/2015 FINDINGS: Endotracheal tube terminates at the thoracic inlet. Patchy left upper lobe opacity, suspicious for pneumonia. Right upper lobe opacity is less  conspicuous/ obscured on the current study. No pleural effusion or pneumothorax. Enteric tube courses into the stomach. IMPRESSION: Endotracheal tube terminates at the thoracic inlet. Enteric tube courses into the stomach. Patchy left upper lobe opacity, suspicious for pneumonia. Electronically Signed   By: Charline Bills M.D.   On: 04/25/2015 15:06   Dg Chest Portable 1 View  04/25/2015  CLINICAL DATA:  Shortness of breath, chest pain and fever. Wheezing. EXAM: PORTABLE CHEST 1 VIEW COMPARISON:  08/20/2014 FINDINGS: Lungs are adequately inflated demonstrate hazy opacification over the upper lobes bilaterally which may be due to infection. No evidence of effusion. Cardiomediastinal silhouette is within normal. Remainder the exam is unchanged. IMPRESSION:  Mild hazy airspace density over the upper lobes bilaterally which may be due to infection. Electronically Signed   By: Elberta Fortis M.D.   On: 04/25/2015 14:25    Medications:  Prior to Admission:  Prescriptions prior to admission  Medication Sig Dispense Refill Last Dose  . albuterol (PROVENTIL HFA;VENTOLIN HFA) 108 (90 BASE) MCG/ACT inhaler Inhale 2 puffs into the lungs every 4 (four) hours as needed for wheezing or shortness of breath. 1 Inhaler 1 04/25/2015 at Unknown time  . albuterol (PROVENTIL) (2.5 MG/3ML) 0.083% nebulizer solution Take 3 mLs (2.5 mg total) by nebulization every 4 (four) hours as needed for wheezing or shortness of breath.   04/25/2015 at Unknown time  . cyclobenzaprine (FLEXERIL) 10 MG tablet Take 10 mg by mouth at bedtime.   04/24/2015 at Unknown time  . DULoxetine (CYMBALTA) 30 MG capsule Take 30 mg by mouth 2 (two) times daily.   04/25/2015 at Unknown time  . Ipratropium-Albuterol (COMBIVENT) 20-100 MCG/ACT AERS respimat Inhale 1 puff into the lungs 4 (four) times daily. 1 Inhaler 1 04/25/2015 at Unknown time  . Linaclotide (LINZESS) 145 MCG CAPS capsule Take 1 capsule (145 mcg total) by mouth daily before breakfast.  For constipation 30 capsule 5 04/24/2015 at Unknown time  . oxyCODONE-acetaminophen (PERCOCET) 10-325 MG per tablet Take 1 tablet by mouth every 6 (six) hours as needed (severe pain). 20 tablet 0 Past Week at Unknown time  . pantoprazole (PROTONIX) 40 MG tablet Take 1 tablet (40 mg total) by mouth daily before breakfast. 30 tablet 5 04/24/2015 at Unknown time  . promethazine (PHENERGAN) 12.5 MG tablet 1 po q6h prn nausea (Patient taking differently: Take 12.5 mg by mouth every 6 (six) hours as needed for nausea. 1 po q6h prn nausea) 12 tablet 0 unknown  . traZODone (DESYREL) 100 MG tablet Take 100 mg by mouth at bedtime.   04/24/2015 at Unknown time  . Spacer/Aero-Holding Chambers (AEROCHAMBER PLUS WITH MASK) inhaler Use as instructed 1 each 2 Taking   Scheduled: . albuterol  2.5 mg Nebulization Q4H  . antiseptic oral rinse  7 mL Mouth Rinse QID  . azithromycin  500 mg Intravenous Q24H  . cefTRIAXone (ROCEPHIN)  IV  1 g Intravenous Q24H  . chlorhexidine gluconate  15 mL Mouth Rinse BID  . enoxaparin (LOVENOX) injection  40 mg Subcutaneous Q24H  . famotidine (PEPCID) IV  20 mg Intravenous Q12H  . methylPREDNISolone (SOLU-MEDROL) injection  125 mg Intravenous Q6H  . midazolam  2 mg Intravenous Once   Continuous: . sodium chloride 1,000 mL (04/26/15 0600)  . fentaNYL infusion INTRAVENOUS    . propofol (DIPRIVAN) infusion 65 mcg/kg/min (04/26/15 0817)   PRN:  Assesment: He has acute on chronic respiratory failure. At baseline he has severe COPD. He has community-acquired pneumonia. Active Problems:   Tobacco dependence   Acute respiratory failure with hypoxia (HCC)   COPD exacerbation (HCC)   Community acquired pneumonia    Plan: I don't think we have much opportunity to wean. Would leave him on the ventilator today and perhaps attempt weaning tomorrow    LOS: 1 day   [name] Carter 04/26/2015, 8:38 AM

## 2015-04-26 NOTE — Care Management Note (Signed)
sCase Management Note  Patient Details  Name: [name] Carter MRN: [id number] Date of Birth: 1961-06-13  Subjective/Objective:                  Pt is from home with family and ind at baseline. Admitted for COPD exacerbation. Pt currently intubated and sedated.   Action/Plan: Anticipate return home with self care at DC. Will cont to follow for DC planning.   Expected Discharge Date:  04/30/15               Expected Discharge Plan:  Home/Self Care  In-House Referral:  NA  Discharge planning Services  CM Consult  Post Acute Care Choice:  NA Choice offered to:  NA  DME Arranged:    DME Agency:     HH Arranged:    HH Agency:     Status of Service:  In process, will continue to follow  Medicare Important Message Given:    Date Medicare IM Given:    Medicare IM give by:    Date Additional Medicare IM Given:    Additional Medicare Important Message give by:     If discussed at Long Length of Stay Meetings, dates discussed:    Additional Comments:  [name], Martin Smeal Demske, RN 04/26/2015, 3:52 PM

## 2015-04-26 NOTE — Progress Notes (Signed)
[facility] PROGRESS NOTE  Geoff Dacanay WJX:[id number] DOB: 07-Sep-1960 DOA: 04/25/2015 PCP: [name]  Assessment/Plan: Acute Hypoxemic Respiratory Failure -Ventilatory dependant. -No weaning attempts today per pulmonary. -2/2 CAP and COPD exacerbation. -See below for details. -Repeat CXR in am.  CAP -Continue rocephin/azithro. -Cx data pending.  COPD with Acute Exacerbation -Still very tight, continue steroids at current dose. -Continue nebs.   Code Status: Full Code Family Communication: None  Disposition Plan: Keep in ICU today   Consultants:  Pulmonary, Dr. Juanetta Gosling   Antibiotics:  Rocephin  Azithro   Subjective: Heavily sedated on vent.  Objective: Filed Vitals:   04/26/15 0830 04/26/15 0845 04/26/15 0900 04/26/15 0915  BP: 109/64 100/59 96/58 97/56   Pulse: 66 69 68 66  Temp: 96.9 F (36.1 C) 97 F (36.1 C) 97.1 F (36.2 C) 97.1 F (36.2 C)  TempSrc:      Resp: Height:      Weight:      SpO2: 100% 96% 96% 97%    Intake/Output Summary (Last 24 hours) at 04/26/15 1017 Last data filed at 04/26/15 0600  Gross per 24 hour  Intake 2409.78 ml  Output   1700 ml  Net 709.78 ml   Filed Weights   04/25/15 1405 04/25/15 1705 04/26/15 0500  Weight: 83.915 kg (185 lb) 82.3 kg (181 lb 7 oz) 83.3 kg (183 lb 10.3 oz)    Exam:   General:  Sedated on vent  Cardiovascular: RRR  Respiratory: Fair air movement, slight wheezes  Abdomen: S/NT/ND/+BS  Extremities: no C/C/E   Neurologic:  Unable to assess given sedation.  Data Reviewed: Basic Metabolic Panel:  Recent Labs Lab 04/25/15 1417 04/26/15 0459  NA 138 138  K 3.7 4.4  CL 105 110  CO2 24 22  GLUCOSE 90 164*  BUN 11 12  CREATININE 1.15 0.79  CALCIUM 8.8* 8.0*   Liver Function Tests:  Recent Labs Lab 04/26/15 0459  AST 26  ALT 17  ALKPHOS 64  BILITOT 0.6  PROT 5.7*  ALBUMIN 3.1*   No results for input(s): LIPASE, AMYLASE in  the last 168 hours. No results for input(s): AMMONIA in the last 168 hours. CBC:  Recent Labs Lab 04/25/15 1417 04/26/15 0459  WBC 11.4* 7.5  NEUTROABS 9.1*  --   HGB 14.3 12.9*  HCT 43.0 39.5  MCV 92.7 92.9  PLT 173 151   Cardiac Enzymes:  Recent Labs Lab 04/25/15 1417  TROPONINI <0.03   BNP (last 3 results) No results for input(s): BNP in the last 8760 hours.  ProBNP (last 3 results)  Recent Labs  05/13/14 1352  PROBNP 120.7    CBG: No results for input(s): GLUCAP in the last 168 hours.  Recent Results (from the past 240 hour(s))  Blood Culture (routine x 2)     Status: None (Preliminary result)   Collection Time: 04/25/15  2:51 PM  Result Value Ref Range Status   Specimen Description BLOOD RIGHT HAND  Final   Special Requests BOTTLES DRAWN AEROBIC AND ANAEROBIC 8CC EACH  Final   Culture NO GROWTH <12 HOURS  Final   Report Status PENDING  Incomplete  Blood Culture (routine x 2)     Status: None (Preliminary result)   Collection Time: 04/25/15  2:59 PM  Result Value Ref Range Status   Specimen Description BLOOD LEFT HAND  Final   Special Requests BOTTLES DRAWN AEROBIC AND ANAEROBIC [facility] EACH  Final  Culture NO GROWTH <12 HOURS  Final   Report Status PENDING  Incomplete  MRSA PCR Screening     Status: None   Collection Time: 04/25/15  5:35 PM  Result Value Ref Range Status   MRSA by PCR NEGATIVE NEGATIVE Final    Comment:        The GeneXpert MRSA Assay (FDA approved for NASAL specimens only), is one component of a comprehensive MRSA colonization surveillance program. It is not intended to diagnose MRSA infection nor to guide or monitor treatment for MRSA infections.      Studies: Dg Chest Port 1 View  04/25/2015  CLINICAL DATA:  ETT placement, OG tube placed EXAM: PORTABLE CHEST 1 VIEW COMPARISON:  04/25/2015 FINDINGS: Endotracheal tube terminates at the thoracic inlet. Patchy left upper lobe opacity, suspicious for pneumonia. Right upper lobe  opacity is less conspicuous/ obscured on the current study. No pleural effusion or pneumothorax. Enteric tube courses into the stomach. IMPRESSION: Endotracheal tube terminates at the thoracic inlet. Enteric tube courses into the stomach. Patchy left upper lobe opacity, suspicious for pneumonia. Electronically Signed   By: Charline BillsSriyesh  Krishnan M.D.   On: 04/25/2015 15:06   Dg Chest Portable 1 View  04/25/2015  CLINICAL DATA:  Shortness of breath, chest pain and fever. Wheezing. EXAM: PORTABLE CHEST 1 VIEW COMPARISON:  08/20/2014 FINDINGS: Lungs are adequately inflated demonstrate hazy opacification over the upper lobes bilaterally which may be due to infection. No evidence of effusion. Cardiomediastinal silhouette is within normal. Remainder the exam is unchanged. IMPRESSION: Mild hazy airspace density over the upper lobes bilaterally which may be due to infection. Electronically Signed   By: Elberta Fortisaniel  Boyle M.D.   On: 04/25/2015 14:25    Scheduled Meds: . albuterol  2.5 mg Nebulization Q4H  . albuterol      . antiseptic oral rinse  7 mL Mouth Rinse QID  . azithromycin  500 mg Intravenous Q24H  . cefTRIAXone (ROCEPHIN)  IV  1 g Intravenous Q24H  . chlorhexidine gluconate  15 mL Mouth Rinse BID  . enoxaparin (LOVENOX) injection  40 mg Subcutaneous Q24H  . famotidine (PEPCID) IV  20 mg Intravenous Q12H  . methylPREDNISolone (SOLU-MEDROL) injection  125 mg Intravenous Q6H  . midazolam  2 mg Intravenous Once   Continuous Infusions: . sodium chloride 1,000 mL (04/26/15 0600)  . fentaNYL infusion INTRAVENOUS    . propofol (DIPRIVAN) infusion 65 mcg/kg/min (04/26/15 [id number])    Active Problems:   Tobacco dependence   Acute respiratory failure with hypoxia (HCC)   COPD exacerbation (HCC)   Community acquired pneumonia    Critical care time spent: 45 minutes. Greater than 50% of this time was spent in direct contact with the patient coordinating care.    Chaya JanHERNANDEZ ACOSTA,ESTELA  [facility] Pager [phone]  If 7PM-7AM, please contact night-coverage at [email or web address], password [email or web address] 04/26/2015, 10:17 AM  LOS: 1 day

## 2015-04-26 NOTE — Progress Notes (Signed)
Initial Nutrition Assessment   INTERVENTION: If unable to wean as expected: -Initiate  Vital High Protein @ 20 ml/hr via OGT   -Add 60 ml Prostat TID (90 gr protein and 600 kcal) per tube.    -Tube feeding regimen including propofol provides 1938 kcal (100% of needs), 132 grams of protein, and 401ml of H2O.     -Propofol providing: 858 kcal lipids at current rate noted below.   NUTRITION DIAGNOSIS:     Inadequate oral intake related to inability to eat as evidenced by vent status.   GOAL:  Pt will meet est needs based on ASPEN guidelines    MONITOR: Vent status, labs and weight changes     REASON FOR ASSESSMENT:   Ventilator    ASSESSMENT: Pt has hx of severe COPD and presents with acute respiratory distress. Unlikely to wean today per MD. He has an OG to low intermittent suction-100 ml output noted.  MV: 12.1  Temp (24hrs), Avg:98.1 F (36.7 C), Min:96.8 F (36 C), Max:100.2 F (37.9 C)  Propofol: 32.5 ml/hr   Diet Order:  Diet NPO time specified  Skin:   intact  Last BM:   10/24  Height:   Ht Readings from Last 1 Encounters:  04/25/15 5\' 9"  (1.753 m)    Weight:   Wt Readings from Last 1 Encounters:  04/26/15 183 lb 10.3 oz (83.3 kg)    Ideal Body Weight:  72.7 kg  BMI:  Body mass index is 27.11 kg/(m^2).  Estimated Nutritional Needs:   Kcal: 1909   Protein:  124.5 gr  Fluid:  1.9 liters daily  EDUCATION NEEDS: none identified  Royann ShiversLynn Michel Hendon MS,RD,CSG,LDN Office: [phone] Pager: [phone]

## 2015-04-27 ENCOUNTER — Other Ambulatory Visit: Payer: Self-pay

## 2015-04-27 ENCOUNTER — Encounter: Payer: Self-pay | Admitting: Gastroenterology

## 2015-04-27 ENCOUNTER — Inpatient Hospital Stay (HOSPITAL_COMMUNITY): Payer: Medicaid Other

## 2015-04-27 LAB — BLOOD GAS, ARTERIAL
Acid-Base Excess: 3.7 mmol/L — ABNORMAL HIGH (ref 0.0–2.0)
Acid-base deficit: 1.6 mmol/L (ref 0.0–2.0)
BICARBONATE: 21.3 meq/L (ref 20.0–24.0)
Bicarbonate: 22.6 mEq/L (ref 20.0–24.0)
DRAWN BY: 317771
Drawn by: 25788
FIO2: 0.4
FIO2: 40
LHR: 16 {breaths}/min
MECHVT: 560 mL
O2 Saturation: 90.5 %
O2 Saturation: 97.2 %
PCO2 ART: 39.4 mmHg (ref 35.0–45.0)
PEEP/CPAP: 5 cmH2O
PEEP: 5 cmH2O
PO2 ART: 62.6 mmHg — AB (ref 80.0–100.0)
PO2 ART: 104 mmHg — AB (ref 80.0–100.0)
Pressure support: 5 cmH2O
RATE: 16 resp/min
VT: 560 mL
pCO2 arterial: 40.4 mmHg (ref 35.0–45.0)
pH, Arterial: 7.371 (ref 7.350–7.450)
pH, Arterial: 7.347 — ABNORMAL LOW (ref 7.350–7.450)

## 2015-04-27 LAB — CBC
HEMATOCRIT: 38.7 % — AB (ref 39.0–52.0)
HEMOGLOBIN: 12.5 g/dL — AB (ref 13.0–17.0)
MCH: 30.4 pg (ref 26.0–34.0)
MCHC: 32.3 g/dL (ref 30.0–36.0)
MCV: 94.2 fL (ref 78.0–100.0)
Platelets: 164 10*3/uL (ref 150–400)
RBC: 4.11 MIL/uL — ABNORMAL LOW (ref 4.22–5.81)
RDW: 13.3 % (ref 11.5–15.5)
WBC: 10.7 10*3/uL — AB (ref 4.0–10.5)

## 2015-04-27 LAB — URINE CULTURE: Culture: NO GROWTH

## 2015-04-27 LAB — BASIC METABOLIC PANEL
ANION GAP: 5 (ref 5–15)
BUN: 13 mg/dL (ref 6–20)
CO2: 23 mmol/L (ref 22–32)
Calcium: 8.1 mg/dL — ABNORMAL LOW (ref 8.9–10.3)
Chloride: 116 mmol/L — ABNORMAL HIGH (ref 101–111)
Creatinine, Ser: 0.69 mg/dL (ref 0.61–1.24)
GFR calc Af Amer: >60 mL/min (ref >60)
Glucose, Bld: 146 mg/dL — ABNORMAL HIGH (ref 65–99)
POTASSIUM: 4 mmol/L (ref 3.5–5.1)
SODIUM: 144 mmol/L (ref 135–145)

## 2015-04-27 LAB — LEGIONELLA PNEUMOPHILA SEROGP 1 UR AG: L. pneumophila Serogp 1 Ur Ag: NEGATIVE

## 2015-04-27 MED ORDER — ALBUTEROL SULFATE (2.5 MG/3ML) 0.083% IN NEBU
2.5000 mg | INHALATION_SOLUTION | RESPIRATORY_TRACT | Status: DC | PRN
  Administered 2015-04-30: 2.5 mg via RESPIRATORY_TRACT
  Filled 2015-04-27: qty 3

## 2015-04-27 MED ORDER — OXYCODONE HCL 5 MG PO TABS
5.0000 mg | ORAL_TABLET | Freq: Four times a day (QID) | ORAL | Status: DC | PRN
  Administered 2015-04-27 (×2): 5 mg via ORAL
  Filled 2015-04-27 (×2): qty 1

## 2015-04-27 MED ORDER — KETOROLAC TROMETHAMINE 30 MG/ML IJ SOLN
30.0000 mg | Freq: Once | INTRAMUSCULAR | Status: AC
  Administered 2015-04-27: 30 mg via INTRAVENOUS
  Filled 2015-04-27: qty 1

## 2015-04-27 MED ORDER — BOOST / RESOURCE BREEZE PO LIQD
1.0000 | Freq: Three times a day (TID) | ORAL | Status: DC
  Administered 2015-04-27 – 2015-04-29 (×6): 1 via ORAL

## 2015-04-27 MED ORDER — OXYCODONE-ACETAMINOPHEN 5-325 MG PO TABS
2.0000 | ORAL_TABLET | Freq: Four times a day (QID) | ORAL | Status: DC | PRN
  Administered 2015-04-27 – 2015-04-29 (×6): 2 via ORAL
  Filled 2015-04-27 (×7): qty 2

## 2015-04-27 MED ORDER — OXYCODONE HCL 5 MG PO TABS: 10.0000 mg | ORAL_TABLET | Freq: Four times a day (QID) | ORAL | Status: DC | PRN

## 2015-04-27 MED ORDER — ALPRAZOLAM 0.5 MG PO TABS
0.5000 mg | ORAL_TABLET | Freq: Two times a day (BID) | ORAL | Status: DC | PRN
  Administered 2015-04-27 – 2015-05-03 (×6): 0.5 mg via ORAL
  Filled 2015-04-27: qty 2
  Filled 2015-04-27 (×6): qty 1

## 2015-04-27 MED ORDER — MORPHINE SULFATE (PF) 2 MG/ML IV SOLN
1.0000 mg | Freq: Once | INTRAVENOUS | Status: AC
  Administered 2015-04-27: 1 mg via INTRAVENOUS
  Filled 2015-04-27: qty 1

## 2015-04-27 MED ORDER — IPRATROPIUM-ALBUTEROL 0.5-2.5 (3) MG/3ML IN SOLN
3.0000 mL | RESPIRATORY_TRACT | Status: DC
  Administered 2015-04-27 – 2015-05-03 (×34): 3 mL via RESPIRATORY_TRACT
  Filled 2015-04-27 (×35): qty 3

## 2015-04-27 NOTE — Progress Notes (Signed)
[facility] PROGRESS NOTE  [name] Carter ZOX:[id number] DOB: 09-12-1960 DOA: 04/25/2015 PCP: [name]  Assessment/Plan: Acute Hypoxemic Respiratory Failure -Extubated today. -Currently satting upper 90s on 40% VM. -2/2 CAP and COPD exacerbation. -See below for details.  CAP -Continue rocephin/azithro. -Cx data remains negative to date.  COPD with Acute Exacerbation -No wheezing on exam today altho lots of ronchi with coarse breath sounds. -Continue steroids at current dose. -Add low dose BDZ as he appears to be quite anxious.  Tobacco Abuse -Counseled on cessation.  Code Status: Full Code Family Communication: None  Disposition Plan: Keep in ICU today   Consultants:  Pulmonary, Dr. Juanetta Gosling   Antibiotics:  Rocephin  Azithro   Subjective: Awake, no increase WOB, appears very anxious and tearful.  Objective: Filed Vitals:   04/27/15 0800 04/27/15 0900 04/27/15 0909 04/27/15 1000  BP: 150/82 144/86  139/85  Pulse: 98 100  104  Temp: 97.3 F (36.3 C) 98.5 F (36.9 C)    TempSrc:      Resp: Height:      Weight:      SpO2: 99% 98% 95% 94%    Intake/Output Summary (Last 24 hours) at 04/27/15 1018 Last data filed at 04/27/15 0900  Gross per 24 hour  Intake 3573.24 ml  Output   3975 ml  Net -401.76 ml   Filed Weights   04/25/15 1705 04/26/15 0500 04/27/15 0511  Weight: 82.3 kg (181 lb 7 oz) 83.3 kg (183 lb 10.3 oz) 84.8 kg (186 lb 15.2 oz)    Exam:   General:  Awake, tearful  Cardiovascular: RRR  Respiratory: Good air movement, ronchi and coarse breath sounds.  Abdomen: S/NT/ND/+BS  Extremities: no C/C/E   Neurologic:  Grossly intact and non-focal  Data Reviewed: Basic Metabolic Panel:  Recent Labs Lab 04/25/15 1417 04/26/15 0459 04/27/15 0411  NA 138 138 144  K 3.7 4.4 4.0  CL 105 110 116*  CO2 GLUCOSE 90 164* 146*  BUN CREATININE 1.15 0.79 0.69  CALCIUM 8.8* 8.0*  8.1*   Liver Function Tests:  Recent Labs Lab 04/26/15 0459  AST 26  ALT 17  ALKPHOS 64  BILITOT 0.6  PROT 5.7*  ALBUMIN 3.1*   No results for input(s): LIPASE, AMYLASE in the last 168 hours. No results for input(s): AMMONIA in the last 168 hours. CBC:  Recent Labs Lab 04/25/15 1417 04/26/15 0459 04/27/15 0411  WBC 11.4* 7.5 10.7*  NEUTROABS 9.1*  --   --   HGB 14.3 12.9* 12.5*  HCT 43.0 39.5 38.7*  MCV 92.7 92.9 94.2  PLT 173 151 164   Cardiac Enzymes:  Recent Labs Lab 04/25/15 1417  TROPONINI <0.03   BNP (last 3 results) No results for input(s): BNP in the last 8760 hours.  ProBNP (last 3 results)  Recent Labs  05/13/14 1352  PROBNP 120.7    CBG: No results for input(s): GLUCAP in the last 168 hours.  Recent Results (from the past 240 hour(s))  Blood Culture (routine x 2)     Status: None (Preliminary result)   Collection Time: 04/25/15  2:51 PM  Result Value Ref Range Status   Specimen Description BLOOD RIGHT HAND  Final   Special Requests BOTTLES DRAWN AEROBIC AND ANAEROBIC 8CC EACH  Final   Culture NO GROWTH < 24 HOURS  Final   Report Status PENDING  Incomplete  Urine culture  Status: None   Collection Time: 04/25/15  2:51 PM  Result Value Ref Range Status   Specimen Description URINE, CATHETERIZED  Final   Special Requests NONE  Final   Culture   Final    NO GROWTH 2 DAYS Performed at [facility]    Report Status 04/27/2015 FINAL  Final  Blood Culture (routine x 2)     Status: None (Preliminary result)   Collection Time: 04/25/15  2:59 PM  Result Value Ref Range Status   Specimen Description BLOOD LEFT HAND  Final   Special Requests BOTTLES DRAWN AEROBIC AND ANAEROBIC 8CC EACH  Final   Culture NO GROWTH < 24 HOURS  Final   Report Status PENDING  Incomplete  MRSA PCR Screening     Status: None   Collection Time: 04/25/15  5:35 PM  Result Value Ref Range Status   MRSA by PCR NEGATIVE NEGATIVE Final    Comment:        The  GeneXpert MRSA Assay (FDA approved for NASAL specimens only), is one component of a comprehensive MRSA colonization surveillance program. It is not intended to diagnose MRSA infection nor to guide or monitor treatment for MRSA infections.   Culture, respiratory (NON-Expectorated)     Status: None (Preliminary result)   Collection Time: 04/25/15 11:45 PM  Result Value Ref Range Status   Specimen Description SPUTUM  Final   Special Requests NONE  Final   Gram Stain   Final    FEW WBC PRESENT,BOTH PMN AND MONONUCLEAR RARE SQUAMOUS EPITHELIAL CELLS PRESENT NO ORGANISMS SEEN Performed at [facility]    Culture   Final    Culture reincubated for better growth Performed at [facility]    Report Status PENDING  Incomplete     Studies: Dg Chest Port 1 View  04/27/2015  CLINICAL DATA:  54 year old male with history of acute renal failure and hypoxemia. Evaluate endotracheal tube placement. EXAM: PORTABLE CHEST 1 VIEW COMPARISON:  Chest x-ray 04/25/2015. FINDINGS: An endotracheal tube is in place with tip 6.2 cm above the carina. A nasogastric tube is seen extending into the stomach, however, the tip of the nasogastric tube extends below the lower margin of the image. Lung volumes are normal. Persistent airspace consolidation in the left upper lobe appears very similar to the recent prior examination. Diffuse peribronchial cuffing. No pleural effusions. No definite cephalization of the pulmonary vasculature. Heart size is normal. Upper mediastinal contours are within normal limits. IMPRESSION: 1. Support apparatus, as above. 2. Persistent left upper lobe airspace consolidation concerning for pneumonia. 3. In addition, there is diffuse peribronchial cuffing, suggesting concurrent bronchitis. Electronically Signed   By: Trudie Reed M.D.   On: 04/27/2015 00:49   Dg Chest Port 1 View  04/25/2015  CLINICAL DATA:  ETT placement, OG tube placed EXAM: PORTABLE CHEST 1 VIEW  COMPARISON:  04/25/2015 FINDINGS: Endotracheal tube terminates at the thoracic inlet. Patchy left upper lobe opacity, suspicious for pneumonia. Right upper lobe opacity is less conspicuous/ obscured on the current study. No pleural effusion or pneumothorax. Enteric tube courses into the stomach. IMPRESSION: Endotracheal tube terminates at the thoracic inlet. Enteric tube courses into the stomach. Patchy left upper lobe opacity, suspicious for pneumonia. Electronically Signed   By: Charline Bills M.D.   On: 04/25/2015 15:06   Dg Chest Portable 1 View  04/25/2015  CLINICAL DATA:  Shortness of breath, chest pain and fever. Wheezing. EXAM: PORTABLE CHEST 1 VIEW COMPARISON:  08/20/2014 FINDINGS: Lungs are  adequately inflated demonstrate hazy opacification over the upper lobes bilaterally which may be due to infection. No evidence of effusion. Cardiomediastinal silhouette is within normal. Remainder the exam is unchanged. IMPRESSION: Mild hazy airspace density over the upper lobes bilaterally which may be due to infection. Electronically Signed   By: Elberta Fortisaniel  Boyle M.D.   On: 04/25/2015 14:25    Scheduled Meds: . antiseptic oral rinse  7 mL Mouth Rinse QID  . azithromycin  500 mg Intravenous Q24H  . cefTRIAXone (ROCEPHIN)  IV  1 g Intravenous Q24H  . chlorhexidine gluconate  15 mL Mouth Rinse BID  . enoxaparin (LOVENOX) injection  40 mg Subcutaneous Q24H  . famotidine (PEPCID) IV  20 mg Intravenous Q12H  . ipratropium-albuterol  3 mL Nebulization Q4H  . methylPREDNISolone (SOLU-MEDROL) injection  125 mg Intravenous Q6H  . midazolam  2 mg Intravenous Once   Continuous Infusions: . sodium chloride 1,000 mL (04/27/15 0900)  . fentaNYL infusion INTRAVENOUS    . propofol (DIPRIVAN) infusion 30 mcg/kg/min (04/27/15 0843)    Active Problems:   Tobacco dependence   Acute respiratory failure with hypoxia (HCC)   COPD exacerbation (HCC)   Community acquired pneumonia    Time spent: 25 minutes.  Greater than 50% of this time was spent in direct contact with the patient coordinating care.    Chaya JanHERNANDEZ ACOSTA,ESTELA  [facility] Pager [phone]  If 7PM-7AM, please contact night-coverage at [email or web address], password [email or web address] 04/27/2015, 10:18 AM  LOS: 2 days

## 2015-04-27 NOTE — Progress Notes (Signed)
Subjective: He remains intubated but is in the process of weaning now. He looks fairly comfortable and is motioning for the tube to be removed  Objective: Vital signs in last 24 hours: Temp:  [96.9 F (36.1 C)-98.6 F (37 C)] 97.3 F (36.3 C) (10/25 0800) Pulse Rate:  [53-91] 65 (10/25 0530) Resp:  [18-30] 18 (10/25 0530) BP: (88-138)/(50-85) 104/59 mmHg (10/25 0400) SpO2:  [93 %-100 %] 98 % (10/25 0726) FiO2 (%):  [40 %] 40 % (10/25 0727) Weight:  [84.8 kg (186 lb 15.2 oz)] 84.8 kg (186 lb 15.2 oz) (10/25 0511) Weight change: 0.885 kg (1 lb 15.2 oz) Last BM Date:  (unknown)  Intake/Output from previous day: 10/24 0701 - 10/25 0700 In: 3148.2 [I.V.:2798.2; IV Piggyback:350] Out: 3975 [Urine:3675; Emesis/NG output:300]  PHYSICAL EXAM General appearance: He is still somewhat sedated the endotracheal tube is still in place and he is weaning Resp: rhonchi bilaterally and wheezes bilaterally Cardio: regular rate and rhythm, S1, S2 normal, no murmur, click, rub or gallop GI: soft, non-tender; bowel sounds normal; no masses,  no organomegaly Extremities: extremities normal, atraumatic, no cyanosis or edema  Lab Results:  Results for orders placed or performed during the hospital encounter of 04/25/15 (from the past 48 hour(s))  CBC with Differential     Status: Abnormal   Collection Time: 04/25/15  2:17 PM  Result Value Ref Range   WBC 11.4 (H) 4.0 - 10.5 K/uL   RBC 4.64 4.22 - 5.81 MIL/uL   Hemoglobin 14.3 13.0 - 17.0 g/dL   HCT 43.0 39.0 - 52.0 %   MCV 92.7 78.0 - 100.0 fL   MCH 30.8 26.0 - 34.0 pg   MCHC 33.3 30.0 - 36.0 g/dL   RDW 12.9 11.5 - 15.5 %   Platelets 173 150 - 400 K/uL   Neutrophils Relative % 80 %   Neutro Abs 9.1 (H) 1.7 - 7.7 K/uL   Lymphocytes Relative 11 %   Lymphs Abs 1.3 0.7 - 4.0 K/uL   Monocytes Relative 8 %   Monocytes Absolute 0.9 0.1 - 1.0 K/uL   Eosinophils Relative 1 %   Eosinophils Absolute 0.1 0.0 - 0.7 K/uL   Basophils Relative 0 %    Basophils Absolute 0.0 0.0 - 0.1 K/uL  Troponin I     Status: None   Collection Time: 04/25/15  2:17 PM  Result Value Ref Range   Troponin I <0.03 <0.031 ng/mL    Comment:        NO INDICATION OF MYOCARDIAL INJURY.   Basic metabolic panel     Status: Abnormal   Collection Time: 04/25/15  2:17 PM  Result Value Ref Range   Sodium 138 135 - 145 mmol/L   Potassium 3.7 3.5 - 5.1 mmol/L   Chloride 105 101 - 111 mmol/L   CO2 24 22 - 32 mmol/L   Glucose, Bld 90 65 - 99 mg/dL   BUN 11 6 - 20 mg/dL   Creatinine, Ser 1.15 0.61 - 1.24 mg/dL   Calcium 8.8 (L) 8.9 - 10.3 mg/dL   GFR calc non Af Amer >60 >60 mL/min   GFR calc Af Amer >60 >60 mL/min    Comment: (NOTE) The eGFR has been calculated using the CKD EPI equation. This calculation has not been validated in all clinical situations. eGFR's persistently <60 mL/min signify possible Chronic Kidney Disease.    Anion gap 9 5 - 15  Blood Culture (routine x 2)     Status: None (Preliminary  result)   Collection Time: 04/25/15  2:51 PM  Result Value Ref Range   Specimen Description BLOOD RIGHT HAND    Special Requests BOTTLES DRAWN AEROBIC AND ANAEROBIC 8CC EACH    Culture NO GROWTH < 24 HOURS    Report Status PENDING   Urinalysis, Routine w reflex microscopic (not at [facility])     Status: Abnormal   Collection Time: 04/25/15  2:51 PM  Result Value Ref Range   Color, Urine YELLOW YELLOW   APPearance CLEAR CLEAR   Specific Gravity, Urine >1.030 (H) 1.005 - 1.030   pH 6.0 5.0 - 8.0   Glucose, UA NEGATIVE NEGATIVE mg/dL   Hgb urine dipstick MODERATE (A) NEGATIVE   Bilirubin Urine NEGATIVE NEGATIVE   Ketones, ur NEGATIVE NEGATIVE mg/dL   Protein, ur TRACE (A) NEGATIVE mg/dL   Urobilinogen, UA 0.2 0.0 - 1.0 mg/dL   Nitrite NEGATIVE NEGATIVE   Leukocytes, UA NEGATIVE NEGATIVE  Urine culture     Status: None (Preliminary result)   Collection Time: 04/25/15  2:51 PM  Result Value Ref Range   Specimen Description URINE, CATHETERIZED     Special Requests NONE    Culture      NO GROWTH < 24 HOURS Performed at [facility]    Report Status PENDING   Urine microscopic-add on     Status: Abnormal   Collection Time: 04/25/15  2:51 PM  Result Value Ref Range   Squamous Epithelial / LPF RARE RARE   WBC, UA 3-6 <3 WBC/hpf   RBC / HPF 7-10 <3 RBC/hpf   Bacteria, UA FEW (A) RARE  Blood Culture (routine x 2)     Status: None (Preliminary result)   Collection Time: 04/25/15  2:59 PM  Result Value Ref Range   Specimen Description BLOOD LEFT HAND    Special Requests BOTTLES DRAWN AEROBIC AND ANAEROBIC [place]    Culture NO GROWTH < 24 HOURS    Report Status PENDING   HIV antibody     Status: None   Collection Time: 04/25/15  2:59 PM  Result Value Ref Range   HIV Screen 4th Generation wRfx Non Reactive Non Reactive    Comment: (NOTE) Performed At: [facility] [address] [place], [place] [id number] Lindon Romp MD QQ:[id number]   I-Stat CG4 Lactic Acid, ED  (not at  [facility])     Status: None   Collection Time: 04/25/15  3:11 PM  Result Value Ref Range   Lactic Acid, Venous 1.61 0.5 - 2.0 mmol/L  Lactic acid, plasma     Status: None   Collection Time: 04/25/15  3:30 PM  Result Value Ref Range   Lactic Acid, Venous 1.8 0.5 - 2.0 mmol/L  Influenza panel by PCR (type A & B, H1N1)     Status: None   Collection Time: 04/25/15  3:36 PM  Result Value Ref Range   Influenza A By PCR NEGATIVE NEGATIVE   Influenza B By PCR NEGATIVE NEGATIVE   H1N1 flu by pcr NOT DETECTED NOT DETECTED    Comment:        The Xpert Flu assay (FDA approved for nasal aspirates or washes and nasopharyngeal swab specimens), is intended as an aid in the diagnosis of influenza and should not be used as a sole basis for treatment.   Strep pneumoniae urinary antigen     Status: None   Collection Time: 04/25/15  4:48 PM  Result Value Ref Range   Strep Pneumo Urinary Antigen  NEGATIVE NEGATIVE    Comment:        Infection due to  S. pneumoniae cannot be absolutely ruled out since the antigen present may be below the detection limit of the test. Performed at [facility]   Blood gas, arterial     Status: Abnormal   Collection Time: 04/25/15  5:10 PM  Result Value Ref Range   FIO2 50.00    Delivery systems VENTILATOR    Mode PRESSURE REGULATED VOLUME CONTROL    VT 560 mL   LHR 16 resp/min   Peep/cpap 5.0 cm H20   pH, Arterial 7.319 (L) 7.350 - 7.450   pCO2 arterial 46.5 (H) 35.0 - 45.0 mmHg   pO2, Arterial 138.00 (H) 80.0 - 100.0 mmHg   Bicarbonate 22.3 20.0 - 24.0 mEq/L   Acid-base deficit 2.0 0.0 - 2.0 mmol/L   O2 Saturation 98.5 %   Collection site RIGHT RADIAL    Drawn by LAWSON,P.RRT    Sample type ARTERIAL    Allens test (pass/fail) PASS PASS  MRSA PCR Screening     Status: None   Collection Time: 04/25/15  5:35 PM  Result Value Ref Range   MRSA by PCR NEGATIVE NEGATIVE    Comment:        The GeneXpert MRSA Assay (FDA approved for NASAL specimens only), is one component of a comprehensive MRSA colonization surveillance program. It is not intended to diagnose MRSA infection nor to guide or monitor treatment for MRSA infections.   Lactic acid, plasma     Status: None   Collection Time: 04/25/15  7:03 PM  Result Value Ref Range   Lactic Acid, Venous 1.1 0.5 - 2.0 mmol/L  Triglycerides     Status: None   Collection Time: 04/25/15 10:08 PM  Result Value Ref Range   Triglycerides 79 <150 mg/dL  Culture, respiratory (NON-Expectorated)     Status: None (Preliminary result)   Collection Time: 04/25/15 11:45 PM  Result Value Ref Range   Specimen Description SPUTUM    Special Requests NONE    Gram Stain      FEW WBC PRESENT,BOTH PMN AND MONONUCLEAR RARE SQUAMOUS EPITHELIAL CELLS PRESENT NO ORGANISMS SEEN Performed at [facility]    Culture PENDING    Report Status PENDING   Blood gas, arterial     Status: Abnormal   Collection Time: 04/26/15  3:13 AM  Result Value Ref  Range   FIO2 0.40    Delivery systems VENTILATOR    Mode PRESSURE REGULATED VOLUME CONTROL    VT 560 mL   LHR 16.0 resp/min   Peep/cpap 5.0 cm H20   pH, Arterial 7.341 (L) 7.350 - 7.450   pCO2 arterial 42.7 35.0 - 45.0 mmHg   pO2, Arterial 81.2 80.0 - 100.0 mmHg   Bicarbonate 22.1 20.0 - 24.0 mEq/L   Acid-base deficit 2.4 (H) 0.0 - 2.0 mmol/L   O2 Saturation 95.1 %   Collection site RIGHT RADIAL    Drawn by [id number]    Sample type ARTERIAL DRAW    Allens test (pass/fail) PASS PASS  Comprehensive metabolic panel     Status: Abnormal   Collection Time: 04/26/15  4:59 AM  Result Value Ref Range   Sodium 138 135 - 145 mmol/L   Potassium 4.4 3.5 - 5.1 mmol/L   Chloride 110 101 - 111 mmol/L   CO2 22 22 - 32 mmol/L   Glucose, Bld 164 (H) 65 - 99 mg/dL   BUN 12 6 -  20 mg/dL   Creatinine, Ser 0.79 0.61 - 1.24 mg/dL   Calcium 8.0 (L) 8.9 - 10.3 mg/dL   Total Protein 5.7 (L) 6.5 - 8.1 g/dL   Albumin 3.1 (L) 3.5 - 5.0 g/dL   AST 26 15 - 41 U/L   ALT 17 17 - 63 U/L   Alkaline Phosphatase 64 38 - 126 U/L   Total Bilirubin 0.6 0.3 - 1.2 mg/dL   GFR calc non Af Amer >60 >60 mL/min   GFR calc Af Amer >60 >60 mL/min    Comment: (NOTE) The eGFR has been calculated using the CKD EPI equation. This calculation has not been validated in all clinical situations. eGFR's persistently <60 mL/min signify possible Chronic Kidney Disease.    Anion gap 6 5 - 15  CBC     Status: Abnormal   Collection Time: 04/26/15  4:59 AM  Result Value Ref Range   WBC 7.5 4.0 - 10.5 K/uL   RBC 4.25 4.22 - 5.81 MIL/uL   Hemoglobin 12.9 (L) 13.0 - 17.0 g/dL   HCT 39.5 39.0 - 52.0 %   MCV 92.9 78.0 - 100.0 fL   MCH 30.4 26.0 - 34.0 pg   MCHC 32.7 30.0 - 36.0 g/dL   RDW 13.0 11.5 - 15.5 %   Platelets 151 150 - 400 K/uL  Blood gas, arterial     Status: Abnormal   Collection Time: 04/27/15  4:01 AM  Result Value Ref Range   FIO2 0.40    Delivery systems VENTILATOR    Mode PRESSURE REGULATED VOLUME CONTROL     VT 560 mL   LHR 16.0 resp/min   Peep/cpap 5.0 cm H20   pH, Arterial 7.371 7.350 - 7.450   pCO2 arterial 40.4 35.0 - 45.0 mmHg   pO2, Arterial 62.6 (L) 80.0 - 100.0 mmHg   Bicarbonate 22.6 20.0 - 24.0 mEq/L   Acid-base deficit 1.6 0.0 - 2.0 mmol/L   O2 Saturation 90.5 %   Collection site RIGHT RADIAL    Drawn by [id number]    Sample type ARTERIAL DRAW    Allens test (pass/fail) PASS PASS  Basic metabolic panel     Status: Abnormal   Collection Time: 04/27/15  4:11 AM  Result Value Ref Range   Sodium 144 135 - 145 mmol/L   Potassium 4.0 3.5 - 5.1 mmol/L   Chloride 116 (H) 101 - 111 mmol/L   CO2 23 22 - 32 mmol/L   Glucose, Bld 146 (H) 65 - 99 mg/dL   BUN 13 6 - 20 mg/dL   Creatinine, Ser 0.69 0.61 - 1.24 mg/dL   Calcium 8.1 (L) 8.9 - 10.3 mg/dL   GFR calc non Af Amer >60 >60 mL/min   GFR calc Af Amer >60 >60 mL/min    Comment: (NOTE) The eGFR has been calculated using the CKD EPI equation. This calculation has not been validated in all clinical situations. eGFR's persistently <60 mL/min signify possible Chronic Kidney Disease.    Anion gap 5 5 - 15  CBC     Status: Abnormal   Collection Time: 04/27/15  4:11 AM  Result Value Ref Range   WBC 10.7 (H) 4.0 - 10.5 K/uL   RBC 4.11 (L) 4.22 - 5.81 MIL/uL   Hemoglobin 12.5 (L) 13.0 - 17.0 g/dL   HCT 38.7 (L) 39.0 - 52.0 %   MCV 94.2 78.0 - 100.0 fL   MCH 30.4 26.0 - 34.0 pg   MCHC 32.3 30.0 - 36.0 g/dL  RDW 13.3 11.5 - 15.5 %   Platelets 164 150 - 400 K/uL  Blood gas, arterial     Status: Abnormal   Collection Time: 04/27/15  8:35 AM  Result Value Ref Range   FIO2 40.00    Delivery systems VENTILATOR    Mode PRESSURE REGULATED VOLUME CONTROL    VT 560 mL   LHR 16 resp/min   Peep/cpap 5.0 cm H20   Pressure support 5 cm H20   pH, Arterial 7.347 (L) 7.350 - 7.450   pCO2 arterial 39.4 35.0 - 45.0 mmHg   pO2, Arterial 104 (H) 80.0 - 100.0 mmHg   Bicarbonate 21.3 20.0 - 24.0 mEq/L   Acid-Base Excess 3.7 (H) 0.0 - 2.0 mmol/L    O2 Saturation 97.2 %   Collection site LEFT RADIAL    Drawn by [id number]    Sample type ARTERIAL    Allens test (pass/fail) PASS PASS    ABGS  Recent Labs  04/27/15 0835  PHART 7.347*  PO2ART 104*  HCO3 21.3   CULTURES Recent Results (from the past 240 hour(s))  Blood Culture (routine x 2)     Status: None (Preliminary result)   Collection Time: 04/25/15  2:51 PM  Result Value Ref Range Status   Specimen Description BLOOD RIGHT HAND  Final   Special Requests BOTTLES DRAWN AEROBIC AND ANAEROBIC [place]  Final   Culture NO GROWTH < 24 HOURS  Final   Report Status PENDING  Incomplete  Urine culture     Status: None (Preliminary result)   Collection Time: 04/25/15  2:51 PM  Result Value Ref Range Status   Specimen Description URINE, CATHETERIZED  Final   Special Requests NONE  Final   Culture   Final    NO GROWTH < 24 HOURS Performed at [facility]    Report Status PENDING  Incomplete  Blood Culture (routine x 2)     Status: None (Preliminary result)   Collection Time: 04/25/15  2:59 PM  Result Value Ref Range Status   Specimen Description BLOOD LEFT HAND  Final   Special Requests BOTTLES DRAWN AEROBIC AND ANAEROBIC [place]  Final   Culture NO GROWTH < 24 HOURS  Final   Report Status PENDING  Incomplete  MRSA PCR Screening     Status: None   Collection Time: 04/25/15  5:35 PM  Result Value Ref Range Status   MRSA by PCR NEGATIVE NEGATIVE Final    Comment:        The GeneXpert MRSA Assay (FDA approved for NASAL specimens only), is one component of a comprehensive MRSA colonization surveillance program. It is not intended to diagnose MRSA infection nor to guide or monitor treatment for MRSA infections.   Culture, respiratory (NON-Expectorated)     Status: None (Preliminary result)   Collection Time: 04/25/15 11:45 PM  Result Value Ref Range Status   Specimen Description SPUTUM  Final   Special Requests NONE  Final   Gram Stain   Final    FEW WBC  PRESENT,BOTH PMN AND MONONUCLEAR RARE SQUAMOUS EPITHELIAL CELLS PRESENT NO ORGANISMS SEEN Performed at [facility]    Culture PENDING  Incomplete   Report Status PENDING  Incomplete   Studies/Results: Dg Chest Port 1 View  04/27/2015  CLINICAL DATA:  54 year old male with history of acute renal failure and hypoxemia. Evaluate endotracheal tube placement. EXAM: PORTABLE CHEST 1 VIEW COMPARISON:  Chest x-ray 04/25/2015. FINDINGS: An endotracheal tube is in place with tip 6.2  cm above the carina. A nasogastric tube is seen extending into the stomach, however, the tip of the nasogastric tube extends below the lower margin of the image. Lung volumes are normal. Persistent airspace consolidation in the left upper lobe appears very similar to the recent prior examination. Diffuse peribronchial cuffing. No pleural effusions. No definite cephalization of the pulmonary vasculature. Heart size is normal. Upper mediastinal contours are within normal limits. IMPRESSION: 1. Support apparatus, as above. 2. Persistent left upper lobe airspace consolidation concerning for pneumonia. 3. In addition, there is diffuse peribronchial cuffing, suggesting concurrent bronchitis. Electronically Signed   By: Vinnie Langton M.D.   On: 04/27/2015 00:49   Dg Chest Port 1 View  04/25/2015  CLINICAL DATA:  ETT placement, OG tube placed EXAM: PORTABLE CHEST 1 VIEW COMPARISON:  04/25/2015 FINDINGS: Endotracheal tube terminates at the thoracic inlet. Patchy left upper lobe opacity, suspicious for pneumonia. Right upper lobe opacity is less conspicuous/ obscured on the current study. No pleural effusion or pneumothorax. Enteric tube courses into the stomach. IMPRESSION: Endotracheal tube terminates at the thoracic inlet. Enteric tube courses into the stomach. Patchy left upper lobe opacity, suspicious for pneumonia. Electronically Signed   By: Julian Hy M.D.   On: 04/25/2015 15:06   Dg Chest Portable 1  View  04/25/2015  CLINICAL DATA:  Shortness of breath, chest pain and fever. Wheezing. EXAM: PORTABLE CHEST 1 VIEW COMPARISON:  08/20/2014 FINDINGS: Lungs are adequately inflated demonstrate hazy opacification over the upper lobes bilaterally which may be due to infection. No evidence of effusion. Cardiomediastinal silhouette is within normal. Remainder the exam is unchanged. IMPRESSION: Mild hazy airspace density over the upper lobes bilaterally which may be due to infection. Electronically Signed   By: Marin Olp M.D.   On: 04/25/2015 14:25    Medications:  Prior to Admission:  Prescriptions prior to admission  Medication Sig Dispense Refill Last Dose  . albuterol (PROVENTIL HFA;VENTOLIN HFA) 108 (90 BASE) MCG/ACT inhaler Inhale 2 puffs into the lungs every 4 (four) hours as needed for wheezing or shortness of breath. 1 Inhaler 1 04/25/2015 at Unknown time  . albuterol (PROVENTIL) (2.5 MG/3ML) 0.083% nebulizer solution Take 3 mLs (2.5 mg total) by nebulization every 4 (four) hours as needed for wheezing or shortness of breath.   04/25/2015 at Unknown time  . cyclobenzaprine (FLEXERIL) 10 MG tablet Take 10 mg by mouth at bedtime.   04/24/2015 at Unknown time  . DULoxetine (CYMBALTA) 30 MG capsule Take 30 mg by mouth 2 (two) times daily.   04/25/2015 at Unknown time  . Ipratropium-Albuterol (COMBIVENT) 20-100 MCG/ACT AERS respimat Inhale 1 puff into the lungs 4 (four) times daily. 1 Inhaler 1 04/25/2015 at Unknown time  . Linaclotide (LINZESS) 145 MCG CAPS capsule Take 1 capsule (145 mcg total) by mouth daily before breakfast. For constipation 30 capsule 5 04/24/2015 at Unknown time  . oxyCODONE-acetaminophen (PERCOCET) 10-325 MG per tablet Take 1 tablet by mouth every 6 (six) hours as needed (severe pain). 20 tablet 0 Past Week at Unknown time  . pantoprazole (PROTONIX) 40 MG tablet Take 1 tablet (40 mg total) by mouth daily before breakfast. 30 tablet 5 04/24/2015 at Unknown time  .  promethazine (PHENERGAN) 12.5 MG tablet 1 po q6h prn nausea (Patient taking differently: Take 12.5 mg by mouth every 6 (six) hours as needed for nausea. 1 po q6h prn nausea) 12 tablet 0 unknown  . traZODone (DESYREL) 100 MG tablet Take 100 mg by mouth at bedtime.  04/24/2015 at Unknown time  . Spacer/Aero-Holding Chambers (AEROCHAMBER PLUS WITH MASK) inhaler Use as instructed 1 each 2 Taking   Scheduled: . albuterol  2.5 mg Nebulization Q4H  . antiseptic oral rinse  7 mL Mouth Rinse QID  . azithromycin  500 mg Intravenous Q24H  . cefTRIAXone (ROCEPHIN)  IV  1 g Intravenous Q24H  . chlorhexidine gluconate  15 mL Mouth Rinse BID  . enoxaparin (LOVENOX) injection  40 mg Subcutaneous Q24H  . famotidine (PEPCID) IV  20 mg Intravenous Q12H  . methylPREDNISolone (SOLU-MEDROL) injection  125 mg Intravenous Q6H  . midazolam  2 mg Intravenous Once   Continuous: . sodium chloride 1,000 mL (04/27/15 0629)  . fentaNYL infusion INTRAVENOUS    . propofol (DIPRIVAN) infusion 40 mcg/kg/min (04/27/15 0730)   PRN:  Assesment: He has acute on chronic respiratory failure. 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 He is weaning now and may be ready to come off the ventilator although it's not clear. He is still wheezing and still has rhonchi but does not have much in the way of secretions. Active Problems:   Tobacco dependence   Acute respiratory failure with hypoxia (HCC)   COPD exacerbation ([place])   Community acquired pneumonia    Plan: Continue weaning process. If he meets criteria he will be extubated. He is still critically ill requiring high complexity decision making    LOS: 2 days   Kassadee Carawan L 04/27/2015, 8:49 AM

## 2015-04-27 NOTE — Procedures (Signed)
Extubation Procedure Note  Patient Details:   Name: Sherlynn StallsLeo Runyon DOB: [birth year] MRN: [id number]   Airway Documentation:  Airway 8 mm (Active)  Secured at (cm) 28 cm 04/27/2015  7:27 AM  Measured From Lips 04/27/2015  7:27 AM  Secured Location Center 04/27/2015  7:27 AM  Secured By [employer] Tube Holder 04/27/2015  7:27 AM  Tube Holder Repositioned Yes 04/27/2015  7:27 AM  Cuff Pressure (cm H2O) 26 cm H2O 04/26/2015  7:51 PM  Site Condition Dry 04/27/2015  7:27 AM    Evaluation  O2 sats: stable throughout Complications: No apparent complications Patient did tolerate procedure well. Bilateral Breath Sounds: Expiratory wheezes Suctioning: Oral, Airway Yes  Katheren Shamsowell, Demtrius Rounds Farmer 04/27/2015, 9:11 AM

## 2015-04-27 NOTE — Progress Notes (Signed)
ETT tube advanced another 3cm per MD order. Tube is now at 28cm

## 2015-04-27 NOTE — Progress Notes (Addendum)
Nutrition Follow up   INTERVENTION:  -Continue to advance to soft diet  -Boost Breeze po BID, each supplement provides 250 kcal and 9 grams of protein   NUTRITION DIAGNOSIS:     Inadequate oral intake related to inability to eat as evidenced by pt report  GOAL:  Pt will meet est needs based on ASPEN guidelines    MONITOR:tolerance of diet and percent meal intake     ASSESSMENT: Pt weaned successfully and diet is advancing. He is having difficulty consuming liquids and is c/o pain which he relates to the PNA. Concern for pt ability to meet his nutrition needs orally if this persists. NFPE -normal.   Labs: glucose 146    Diet Order:  Diet clear liquid Room service appropriate?: Yes; Fluid consistency:: Thin  Skin:   intact  Last BM:   10/25  Height:   Ht Readings from Last 1 Encounters:  04/25/15 5\' 9"  (1.753 m)    Weight:   Wt Readings from Last 1 Encounters:  04/27/15 186 lb 15.2 oz (84.8 kg)    Ideal Body Weight:  72.7 kg  BMI:  Body mass index is 27.6 kg/(m^2).  Estimated Nutritional Needs:   Kcal: 1909   Protein:  124.5 gr  Fluid:  1.9 liters daily  EDUCATION NEEDS: none identified  Royann ShiversLynn Samiya Mervin MS,RD,CSG,LDN Office: [phone] Pager: [phone]

## 2015-04-27 NOTE — Progress Notes (Signed)
Pt's ABG was in normal range, HR,RR and BP were all normal. NIF -30, FVC1L. Pt followed simple directions. RT extubated per Dr. Juanetta GoslingHawkins to a 4l [place]. Hr 102, RR 29, BP 132/74. Pt is talking and corherent

## 2015-04-27 NOTE — Progress Notes (Signed)
eLink Physician-Brief Progress Note Patient Name: [name] StallsLeo Carter DOB: 1960-11-09 MRN: [id number]   Date of Service  04/27/2015  HPI/Events of Note  ETT high on yesterday's chest x-ray, now with low tidal volumes  eICU Interventions  Advance ETT 3cm and repeat CXR     Intervention Category Major Interventions: Respiratory failure - evaluation and management  Max FickleDouglas Helaman [place] 04/27/2015, 12:08 AM

## 2015-04-27 NOTE — Progress Notes (Signed)
eLink Physician-Brief Progress Note Patient Name: [name] Carter DOB: 1960/09/13 MRN: [id number]   Date of Service  04/27/2015  HPI/Events of Note  cxr reviewed, ett still > 6cm from carina  eICU Interventions  Advance another 3cm     Intervention Category Intermediate Interventions: Diagnostic test evaluation  Max FickleDouglas Evin Loiseau 04/27/2015, 12:54 AM

## 2015-04-27 NOTE — Care Management Note (Signed)
Case Management Note  Patient Details  Name: [name] Carter MRN: [id number] Date of Birth: 15-Jun-1961  Subjective/Objective:                  Pt extubated this AM. Pt lives with wife and grandchildren. No home O2 but pt does have neb machine. Pt has PCP and no med needs prior to admission.   Action/Plan: Pt plans to return home with self care. No CM needs anticipated. Will cont to follow.   Expected Discharge Date:  04/30/15               Expected Discharge Plan:  Home/Self Care  In-House Referral:  NA  Discharge planning Services  CM Consult  Post Acute Care Choice:  NA Choice offered to:  NA  DME Arranged:    DME Agency:     HH Arranged:    HH Agency:     Status of Service:  In process, will continue to follow  Medicare Important Message Given:    Date Medicare IM Given:    Medicare IM give by:    Date Additional Medicare IM Given:    Additional Medicare Important Message give by:     If discussed at Long Length of Stay Meetings, dates discussed:    Additional Comments:  [name], [name], RN 04/27/2015, 11:44 AM

## 2015-04-27 NOTE — Progress Notes (Signed)
RT called to room due to patient's Vt low as well as Ve low. Elink contacted. RT spoke with Dr. Victorino SparrowMquaid about concerns with patient. Per MD ETT tube was advanced 3cm and is now 25cm measured at the lip. Tube was advanced without any complications. CXR has been ordered to confirm placement of ETT tube.

## 2015-04-27 NOTE — Progress Notes (Signed)
CRITICAL VALUE ALERT  Critical value received:  Positive Legionella in Urine  Date of notification:  04/27/2015  Time of notification:  5:41 PM   Critical value read back:Yes.    Nurse who received alert:  l schonewitz, rn   MD notified (1st page):  hernandez  Time of first page:  5:42 PM

## 2015-04-28 DIAGNOSIS — F17200 Nicotine dependence, unspecified, uncomplicated: Secondary | ICD-10-CM

## 2015-04-28 DIAGNOSIS — J9601 Acute respiratory failure with hypoxia: Secondary | ICD-10-CM

## 2015-04-28 DIAGNOSIS — J189 Pneumonia, unspecified organism: Secondary | ICD-10-CM

## 2015-04-28 DIAGNOSIS — J441 Chronic obstructive pulmonary disease with (acute) exacerbation: Secondary | ICD-10-CM

## 2015-04-28 LAB — CBC
HCT: 40.3 % (ref 39.0–52.0)
HEMOGLOBIN: 12.9 g/dL — AB (ref 13.0–17.0)
MCH: 30.6 pg (ref 26.0–34.0)
MCHC: 32 g/dL (ref 30.0–36.0)
MCV: 95.7 fL (ref 78.0–100.0)
Platelets: 183 10*3/uL (ref 150–400)
RBC: 4.21 MIL/uL — ABNORMAL LOW (ref 4.22–5.81)
RDW: 13.8 % (ref 11.5–15.5)
WBC: 17.8 10*3/uL — ABNORMAL HIGH (ref 4.0–10.5)

## 2015-04-28 LAB — BASIC METABOLIC PANEL
ANION GAP: 7 (ref 5–15)
BUN: 15 mg/dL (ref 6–20)
CALCIUM: 7.9 mg/dL — AB (ref 8.9–10.3)
CO2: 24 mmol/L (ref 22–32)
Chloride: 112 mmol/L — ABNORMAL HIGH (ref 101–111)
Creatinine, Ser: 0.82 mg/dL (ref 0.61–1.24)
GFR calc Af Amer: >60 mL/min (ref >60)
GLUCOSE: 157 mg/dL — AB (ref 65–99)
Potassium: 3.7 mmol/L (ref 3.5–5.1)
Sodium: 143 mmol/L (ref 135–145)

## 2015-04-28 LAB — CULTURE, RESPIRATORY

## 2015-04-28 LAB — CULTURE, RESPIRATORY W GRAM STAIN: Culture: NORMAL

## 2015-04-28 MED ORDER — ALBUTEROL (5 MG/ML) CONTINUOUS INHALATION SOLN
7.5000 mg/h | INHALATION_SOLUTION | RESPIRATORY_TRACT | Status: DC
  Administered 2015-04-28: 7.5 mg/h via RESPIRATORY_TRACT
  Filled 2015-04-28: qty 20

## 2015-04-28 MED ORDER — DOCUSATE SODIUM 100 MG PO CAPS
100.0000 mg | ORAL_CAPSULE | Freq: Two times a day (BID) | ORAL | Status: DC
  Administered 2015-04-28 – 2015-05-03 (×11): 100 mg via ORAL
  Filled 2015-04-28 (×11): qty 1

## 2015-04-28 MED ORDER — MORPHINE SULFATE (PF) 2 MG/ML IV SOLN
2.0000 mg | INTRAVENOUS | Status: DC | PRN
  Administered 2015-04-28 – 2015-05-03 (×49): 2 mg via INTRAVENOUS
  Filled 2015-04-28 (×50): qty 1

## 2015-04-28 MED ORDER — LEVOFLOXACIN IN D5W 750 MG/150ML IV SOLN
750.0000 mg | INTRAVENOUS | Status: DC
  Administered 2015-04-28 – 2015-04-29 (×2): 750 mg via INTRAVENOUS
  Filled 2015-04-28 (×2): qty 150

## 2015-04-28 MED ORDER — BISACODYL 10 MG RE SUPP
10.0000 mg | Freq: Every day | RECTAL | Status: DC | PRN
  Administered 2015-04-29: 10 mg via RECTAL
  Filled 2015-04-28 (×2): qty 1

## 2015-04-28 MED ORDER — GUAIFENESIN ER 600 MG PO TB12
1200.0000 mg | ORAL_TABLET | Freq: Two times a day (BID) | ORAL | Status: DC
  Administered 2015-04-28 – 2015-05-03 (×11): 1200 mg via ORAL
  Filled 2015-04-28 (×10): qty 2

## 2015-04-28 MED ORDER — FUROSEMIDE 10 MG/ML IJ SOLN
40.0000 mg | Freq: Once | INTRAMUSCULAR | Status: AC
  Administered 2015-04-28: 40 mg via INTRAVENOUS
  Filled 2015-04-28: qty 4

## 2015-04-28 MED ORDER — INFLUENZA VAC SPLIT QUAD 0.5 ML IM SUSY
0.5000 mL | PREFILLED_SYRINGE | INTRAMUSCULAR | Status: AC
  Administered 2015-04-29: 0.5 mL via INTRAMUSCULAR
  Filled 2015-04-28: qty 0.5

## 2015-04-28 NOTE — Progress Notes (Signed)
Subjective: He was able to be extubated yesterday and is doing okay. He is still short of breath. He says that in the past Rocephin and Zithromax have not been very effective for him. He is still on IV steroids and is still short of breath and wheezing  Objective: Vital signs in last 24 hours: Temp:  [97.1 F (36.2 C)-98.9 F (37.2 C)] 97.5 F (36.4 C) (10/26 0739) Pulse Rate:  [48-120] 48 (10/26 0400) Resp:  [13-29] 14 (10/26 0400) BP: (86-146)/(65-86) 117/75 mmHg (10/26 0400) SpO2:  [88 %-100 %] 96 % (10/26 0716) FiO2 (%):  [40 %] 40 % (10/25 1137) Weight:  [86.9 kg (191 lb 9.3 oz)] 86.9 kg (191 lb 9.3 oz) (10/26 0452) Weight change: 2.1 kg (4 lb 10.1 oz) Last BM Date: 04/25/15  Intake/Output from previous day: 10/25 0701 - 10/26 0700 In: 2351.3 [P.O.:420; I.V.:1531.3; IV Piggyback:400] Out: 1200 [Urine:1200]  PHYSICAL EXAM General appearance: alert, cooperative and mild distress Resp: rhonchi bilaterally and wheezes bilaterally Cardio: regular rate and rhythm, S1, S2 normal, no murmur, click, rub or gallop GI: soft, non-tender; bowel sounds normal; no masses,  no organomegaly Extremities: extremities normal, atraumatic, no cyanosis or edema  Lab Results:  Results for orders placed or performed during the hospital encounter of 04/25/15 (from the past 48 hour(s))  Blood gas, arterial     Status: Abnormal   Collection Time: 04/27/15  4:01 AM  Result Value Ref Range   FIO2 0.40    Delivery systems VENTILATOR    Mode PRESSURE REGULATED VOLUME CONTROL    VT 560 mL   LHR 16.0 resp/min   Peep/cpap 5.0 cm H20   pH, Arterial 7.371 7.350 - 7.450   pCO2 arterial 40.4 35.0 - 45.0 mmHg   pO2, Arterial 62.6 (L) 80.0 - 100.0 mmHg   Bicarbonate 22.6 20.0 - 24.0 mEq/L   Acid-base deficit 1.6 0.0 - 2.0 mmol/L   O2 Saturation 90.5 %   Collection site RIGHT RADIAL    Drawn by [id number]    Sample type ARTERIAL DRAW    Allens test (pass/fail) PASS PASS  Basic metabolic panel     Status:  Abnormal   Collection Time: 04/27/15  4:11 AM  Result Value Ref Range   Sodium 144 135 - 145 mmol/L   Potassium 4.0 3.5 - 5.1 mmol/L   Chloride 116 (H) 101 - 111 mmol/L   CO2 23 22 - 32 mmol/L   Glucose, Bld 146 (H) 65 - 99 mg/dL   BUN 13 6 - 20 mg/dL   Creatinine, Ser 0.69 0.61 - 1.24 mg/dL   Calcium 8.1 (L) 8.9 - 10.3 mg/dL   GFR calc non Af Amer >60 >60 mL/min   GFR calc Af Amer >60 >60 mL/min    Comment: (NOTE) The eGFR has been calculated using the CKD EPI equation. This calculation has not been validated in all clinical situations. eGFR's persistently <60 mL/min signify possible Chronic Kidney Disease.    Anion gap 5 5 - 15  CBC     Status: Abnormal   Collection Time: 04/27/15  4:11 AM  Result Value Ref Range   WBC 10.7 (H) 4.0 - 10.5 K/uL   RBC 4.11 (L) 4.22 - 5.81 MIL/uL   Hemoglobin 12.5 (L) 13.0 - 17.0 g/dL   HCT 38.7 (L) 39.0 - 52.0 %   MCV 94.2 78.0 - 100.0 fL   MCH 30.4 26.0 - 34.0 pg   MCHC 32.3 30.0 - 36.0 g/dL   RDW 13.3  11.5 - 15.5 %   Platelets 164 150 - 400 K/uL  Blood gas, arterial     Status: Abnormal   Collection Time: 04/27/15  8:35 AM  Result Value Ref Range   FIO2 40.00    Delivery systems VENTILATOR    Mode PRESSURE REGULATED VOLUME CONTROL    VT 560 mL   LHR 16 resp/min   Peep/cpap 5.0 cm H20   Pressure support 5 cm H20   pH, Arterial 7.347 (L) 7.350 - 7.450   pCO2 arterial 39.4 35.0 - 45.0 mmHg   pO2, Arterial 104 (H) 80.0 - 100.0 mmHg   Bicarbonate 21.3 20.0 - 24.0 mEq/L   Acid-Base Excess 3.7 (H) 0.0 - 2.0 mmol/L   O2 Saturation 97.2 %   Collection site LEFT RADIAL    Drawn by [id number]    Sample type ARTERIAL    Allens test (pass/fail) PASS PASS  Basic metabolic panel     Status: Abnormal   Collection Time: 04/28/15  4:50 AM  Result Value Ref Range   Sodium 143 135 - 145 mmol/L   Potassium 3.7 3.5 - 5.1 mmol/L   Chloride 112 (H) 101 - 111 mmol/L   CO2 24 22 - 32 mmol/L   Glucose, Bld 157 (H) 65 - 99 mg/dL   BUN 15 6 - 20 mg/dL    Creatinine, Ser 0.82 0.61 - 1.24 mg/dL   Calcium 7.9 (L) 8.9 - 10.3 mg/dL   GFR calc non Af Amer >60 >60 mL/min   GFR calc Af Amer >60 >60 mL/min    Comment: (NOTE) The eGFR has been calculated using the CKD EPI equation. This calculation has not been validated in all clinical situations. eGFR's persistently <60 mL/min signify possible Chronic Kidney Disease.    Anion gap 7 5 - 15  CBC     Status: Abnormal   Collection Time: 04/28/15  4:50 AM  Result Value Ref Range   WBC 17.8 (H) 4.0 - 10.5 K/uL   RBC 4.21 (L) 4.22 - 5.81 MIL/uL   Hemoglobin 12.9 (L) 13.0 - 17.0 g/dL   HCT 40.3 39.0 - 52.0 %   MCV 95.7 78.0 - 100.0 fL   MCH 30.6 26.0 - 34.0 pg   MCHC 32.0 30.0 - 36.0 g/dL   RDW 13.8 11.5 - 15.5 %   Platelets 183 150 - 400 K/uL    ABGS  Recent Labs  04/27/15 0835  PHART 7.347*  PO2ART 104*  HCO3 21.3   CULTURES Recent Results (from the past 240 hour(s))  Blood Culture (routine x 2)     Status: None (Preliminary result)   Collection Time: 04/25/15  2:51 PM  Result Value Ref Range Status   Specimen Description BLOOD RIGHT HAND  Final   Special Requests BOTTLES DRAWN AEROBIC AND ANAEROBIC [place]  Final   Culture NO GROWTH < 24 HOURS  Final   Report Status PENDING  Incomplete  Urine culture     Status: None   Collection Time: 04/25/15  2:51 PM  Result Value Ref Range Status   Specimen Description URINE, CATHETERIZED  Final   Special Requests NONE  Final   Culture   Final    NO GROWTH 2 DAYS Performed at [facility]    Report Status 04/27/2015 FINAL  Final  Blood Culture (routine x 2)     Status: None (Preliminary result)   Collection Time: 04/25/15  2:59 PM  Result Value Ref Range Status   Specimen Description BLOOD  LEFT HAND  Final   Special Requests BOTTLES DRAWN AEROBIC AND ANAEROBIC 8CC EACH  Final   Culture NO GROWTH < 24 HOURS  Final   Report Status PENDING  Incomplete  MRSA PCR Screening     Status: None   Collection Time: 04/25/15  5:35 PM   Result Value Ref Range Status   MRSA by PCR NEGATIVE NEGATIVE Final    Comment:        The GeneXpert MRSA Assay (FDA approved for NASAL specimens only), is one component of a comprehensive MRSA colonization surveillance program. It is not intended to diagnose MRSA infection nor to guide or monitor treatment for MRSA infections.   Culture, respiratory (NON-Expectorated)     Status: None (Preliminary result)   Collection Time: 04/25/15 11:45 PM  Result Value Ref Range Status   Specimen Description SPUTUM  Final   Special Requests NONE  Final   Gram Stain   Final    FEW WBC PRESENT,BOTH PMN AND MONONUCLEAR RARE SQUAMOUS EPITHELIAL CELLS PRESENT NO ORGANISMS SEEN Performed at [facility]    Culture   Final    Culture reincubated for better growth Performed at [facility]    Report Status PENDING  Incomplete   Studies/Results: Dg Chest Port 1 View  04/27/2015  CLINICAL DATA:  54 year old male with history of acute renal failure and hypoxemia. Evaluate endotracheal tube placement. EXAM: PORTABLE CHEST 1 VIEW COMPARISON:  Chest x-ray 04/25/2015. FINDINGS: An endotracheal tube is in place with tip 6.2 cm above the carina. A nasogastric tube is seen extending into the stomach, however, the tip of the nasogastric tube extends below the lower margin of the image. Lung volumes are normal. Persistent airspace consolidation in the left upper lobe appears very similar to the recent prior examination. Diffuse peribronchial cuffing. No pleural effusions. No definite cephalization of the pulmonary vasculature. Heart size is normal. Upper mediastinal contours are within normal limits. IMPRESSION: 1. Support apparatus, as above. 2. Persistent left upper lobe airspace consolidation concerning for pneumonia. 3. In addition, there is diffuse peribronchial cuffing, suggesting concurrent bronchitis. Electronically Signed   By: Vinnie Langton M.D.   On: 04/27/2015 00:49     Medications:  Prior to Admission:  Prescriptions prior to admission  Medication Sig Dispense Refill Last Dose  . albuterol (PROVENTIL HFA;VENTOLIN HFA) 108 (90 BASE) MCG/ACT inhaler Inhale 2 puffs into the lungs every 4 (four) hours as needed for wheezing or shortness of breath. 1 Inhaler 1 04/25/2015 at Unknown time  . albuterol (PROVENTIL) (2.5 MG/3ML) 0.083% nebulizer solution Take 3 mLs (2.5 mg total) by nebulization every 4 (four) hours as needed for wheezing or shortness of breath.   04/25/2015 at Unknown time  . cyclobenzaprine (FLEXERIL) 10 MG tablet Take 10 mg by mouth at bedtime.   04/24/2015 at Unknown time  . DULoxetine (CYMBALTA) 30 MG capsule Take 30 mg by mouth 2 (two) times daily.   04/25/2015 at Unknown time  . Ipratropium-Albuterol (COMBIVENT) 20-100 MCG/ACT AERS respimat Inhale 1 puff into the lungs 4 (four) times daily. 1 Inhaler 1 04/25/2015 at Unknown time  . Linaclotide (LINZESS) 145 MCG CAPS capsule Take 1 capsule (145 mcg total) by mouth daily before breakfast. For constipation 30 capsule 5 04/24/2015 at Unknown time  . oxyCODONE-acetaminophen (PERCOCET) 10-325 MG per tablet Take 1 tablet by mouth every 6 (six) hours as needed (severe pain). 20 tablet 0 Past Week at Unknown time  . pantoprazole (PROTONIX) 40 MG tablet Take 1 tablet (40  mg total) by mouth daily before breakfast. 30 tablet 5 04/24/2015 at Unknown time  . promethazine (PHENERGAN) 12.5 MG tablet 1 po q6h prn nausea (Patient taking differently: Take 12.5 mg by mouth every 6 (six) hours as needed for nausea. 1 po q6h prn nausea) 12 tablet 0 unknown  . traZODone (DESYREL) 100 MG tablet Take 100 mg by mouth at bedtime.   04/24/2015 at Unknown time  . Spacer/Aero-Holding Chambers (AEROCHAMBER PLUS WITH MASK) inhaler Use as instructed 1 each 2 Taking   Scheduled: . antiseptic oral rinse  7 mL Mouth Rinse QID  . chlorhexidine gluconate  15 mL Mouth Rinse BID  . enoxaparin (LOVENOX) injection  40 mg Subcutaneous  Q24H  . famotidine (PEPCID) IV  20 mg Intravenous Q12H  . feeding supplement  1 Container Oral TID BM  . guaiFENesin  1,200 mg Oral BID  . ipratropium-albuterol  3 mL Nebulization Q4H  . levofloxacin (LEVAQUIN) IV  750 mg Intravenous Q24H  . methylPREDNISolone (SOLU-MEDROL) injection  125 mg Intravenous Q6H   Continuous: . albuterol     MBE:[id number], ALPRAZolam, morphine injection, oxyCODONE-acetaminophen  Assesment: He was admitted with acute hypoxic respiratory failure requiring ventilator support. He was able to be extubated yesterday. He does have community-acquired pneumonia. Because of his history of having trouble clearing with Zithromax and Rocephin I switched him to Levaquin. I agree with Dr. Gus Puma assessment that he needs to stay in the ICU today. He is still very sick with COPD having trouble with chronic pain and having significant anxiety and he is at  risk of ending up having to be reintubated Active Problems:   Tobacco dependence   Acute respiratory failure with hypoxia (HCC)   COPD exacerbation ([place])   Community acquired pneumonia    Plan: Continue current treatments. Continue IV steroids. Change his antibiotics as above. Keep him in the ICU because he is still quite sick and still requiring multiple complex interventions.    LOS: 3 days   Kriston Pasquarello L 04/28/2015, 8:49 AM

## 2015-04-28 NOTE — Progress Notes (Signed)
[facility] PROGRESS NOTE  [name] Carter GNF:[id number] DOB: 05-24-1961    PCP:   [name] Carter   HPI: [name] Carter is an 54 y.o. male with hx of severe COPD, active tobacco abuse, HTN, chronic pain on chronic narcotics, admitted for hypoxic respiratory failure requiring intubation, extubated yesterday, on Rocephin and Zithromax for CAP, complaining of pain and SOB.  He is currently on Solumedrol at 125mg  IV Q 6 hours, and has significant wheezing today.   Morphine shot given yesterday was helpful, and he requested again today.  He is followed by [name] Carter as well.    Rewiew of Systems:  Constitutional: Negative for malaise, fever and chills. No significant weight loss or weight gain Eyes: Negative for eye pain, redness and discharge, diplopia, visual changes, or flashes of light. ENMT: Negative for ear pain, hoarseness, nasal congestion, sinus pressure and sore throat. No headaches; tinnitus, drooling, or problem swallowing. Cardiovascular: Negative for chest pain, palpitations, diaphoresis,  and peripheral edema. ; No orthopnea, PND Respiratory: Negative for cough, hemoptysis, wheezing and stridor. No pleuritic chestpain. Gastrointestinal: Negative for nausea, vomiting, diarrhea, constipation, abdominal pain, melena, blood in stool, hematemesis, jaundice and rectal bleeding.    Genitourinary: Negative for frequency, dysuria, incontinence,flank pain and hematuria; Musculoskeletal: Negative for back pain and neck pain. Negative for swelling and trauma.;  Skin: . Negative for pruritus, rash, abrasions, bruising and skin lesion.; ulcerations Neuro: Negative for headache, lightheadedness and neck stiffness. Negative for weakness, altered level of consciousness , altered mental status, extremity weakness, burning feet, involuntary movement, seizure and syncope.  Psych: negative for anxiety, depression, insomnia, tearfulness, panic attacks, hallucinations, paranoia, suicidal  or homicidal ideation    Past Medical History  Diagnosis Date  . COPD (chronic obstructive pulmonary disease) (HCC)   . Pneumonia   . Chronic back pain   . DDD (degenerative disc disease), lumbar   . DDD (degenerative disc disease), cervical   . Neck pain   . HTN (hypertension)     Past Surgical History  Procedure Laterality Date  . Back surgery      low back x4  . Bilateral carpal tunnel release    . Ulnar nerve transposition      ? by description, not clear  . Bilteral knee surgery    . Esophagogastroduodenoscopy  07/2012    [place] MD: MAC. Chronic gastritis and intestinal metaplasia, ?metaplastic atrophic gastritis, negative H.pylori, no celiac, duidnal mucosa with mild Brunner gland hyperplasia, nonspecific.   . Colonoscopy  07/2012    [place] MD: MAC. Random colon biopsies benign. Rectal polyp tubular adenoma.    Medications:  HOME MEDS: Prior to Admission medications   Medication Sig Start Date End Date Taking? Authorizing Provider  albuterol (PROVENTIL HFA;VENTOLIN HFA) 108 (90 BASE) MCG/ACT inhaler Inhale 2 puffs into the lungs every 4 (four) hours as needed for wheezing or shortness of breath. 08/21/14  Yes Standley Brookinganiel P Goodrich, MD  albuterol (PROVENTIL) (2.5 MG/3ML) 0.083% nebulizer solution Take 3 mLs (2.5 mg total) by nebulization every 4 (four) hours as needed for wheezing or shortness of breath. 05/16/14  Yes Standley Brookinganiel P Goodrich, MD  cyclobenzaprine (FLEXERIL) 10 MG tablet Take 10 mg by mouth at bedtime.   Yes Historical Provider, MD  DULoxetine (CYMBALTA) 30 MG capsule Take 30 mg by mouth 2 (two) times daily.   Yes Historical Provider, MD  Ipratropium-Albuterol (COMBIVENT) 20-100 MCG/ACT AERS respimat Inhale 1 puff into the lungs 4 (four) times daily. 08/21/14  Yes Standley Brookinganiel P Goodrich, MD  Linaclotide (LINZESS) 145 MCG CAPS capsule Take 1 capsule (145 mcg total) by mouth daily before breakfast. For constipation 02/12/15  Yes [name] Kocher, PA-C  oxyCODONE-acetaminophen  (PERCOCET) 10-325 MG per tablet Take 1 tablet by mouth every 6 (six) hours as needed (severe pain). 08/21/14  Yes Standley Brooking, MD  pantoprazole (PROTONIX) 40 MG tablet Take 1 tablet (40 mg total) by mouth daily before breakfast. 02/12/15  Yes [name] Kocher, PA-C  promethazine (PHENERGAN) 12.5 MG tablet 1 po q6h prn nausea Patient taking differently: Take 12.5 mg by mouth every 6 (six) hours as needed for nausea. 1 po q6h prn nausea 06/22/14  Yes [name] Quale, PA-C  traZODone (DESYREL) 100 MG tablet Take 100 mg by mouth at bedtime.   Yes Historical Provider, MD  Spacer/Aero-Holding Chambers (AEROCHAMBER PLUS WITH MASK) inhaler Use as instructed 05/05/14   [name] Sou, MD     Allergies:  Allergies  Allergen Reactions  . Vicodin [Hydrocodone-Acetaminophen] Nausea And Vomiting and Other (See Comments)    Made patient feel funny, stomach pain    Social History:   reports that he has been smoking Cigarettes.  He has been smoking about 1.00 pack per day. He does not have any smokeless tobacco history on file. He reports that he does not drink alcohol or use illicit drugs.  Family History: Family History  Problem Relation Age of Onset  . Heart attack Father   . Colon cancer Father     diagnosed in his 20s     Physical Exam: Filed Vitals:   04/28/15 0400 04/28/15 0452 04/28/15 0716 04/28/15 0739  BP: 117/75     Pulse: 48     Temp: 97.1 F (36.2 C)   97.5 F (36.4 C)  TempSrc: Oral   Oral  Resp: 14     Height:      Weight:  86.9 kg (191 lb 9.3 oz)    SpO2: 96% 92% 96%    Blood pressure 117/75, pulse 48, temperature 97.5 F (36.4 C), temperature source Oral, resp. rate 14, height  (1.753 m), weight 86.9 kg (191 lb 9.3 oz), SpO2 96 %.  GEN:  Pleasant  patient lying in the stretcher in no acute distress; cooperative with exam. PSYCH:  alert and oriented x4; does not appear anxious or depressed; affect is appropriate. HEENT: Mucous membranes pink and anicteric; PERRLA;  EOM intact; no cervical lymphadenopathy nor thyromegaly or carotid bruit; no JVD; There were no stridor. Neck is very supple. Breasts:: Not examined CHEST WALL: No tenderness CHEST: Normal respiration, He has significant wheezing bilaterally, no rales.  HEART: Regular rate and rhythm.  There are no murmur, rub, or gallops.   BACK: No kyphosis or scoliosis; no CVA tenderness ABDOMEN: soft and non-tender; no masses, no organomegaly, normal abdominal bowel sounds; no pannus; no intertriginous candida. There is no rebound and no distention. Rectal Exam: Not done EXTREMITIES: No bone or joint deformity; age-appropriate arthropathy of the hands and knees; no edema; no ulcerations.  There is no calf tenderness. Genitalia: not examined PULSES: 2+ and symmetric SKIN: Normal hydration no rash or ulceration CNS: Cranial nerves 2-12 grossly intact no focal lateralizing neurologic deficit.  Speech is fluent; uvula elevated with phonation, facial symmetry and tongue midline. DTR are normal bilaterally, cerebella exam is intact, barbinski is negative and strengths are equaled bilaterally.  No sensory loss.   Labs on Admission:  Basic Metabolic Panel:  Recent Labs Lab 04/25/15 1417 04/26/15 0459 04/27/15 0411 04/28/15 7253  NA 138 138 144 143  K 3.7 4.4 4.0 3.7  CL 105 110 116* 112*  CO2 GLUCOSE 90 164* 146* 157*  BUN CREATININE 1.15 0.79 0.69 0.82  CALCIUM 8.8* 8.0* 8.1* 7.9*   Liver Function Tests:  Recent Labs Lab 04/26/15 0459  AST 26  ALT 17  ALKPHOS 64  BILITOT 0.6  PROT 5.7*  ALBUMIN 3.1*   CBC:  Recent Labs Lab 04/25/15 1417 04/26/15 0459 04/27/15 0411 04/28/15 0450  WBC 11.4* 7.5 10.7* 17.8*  NEUTROABS 9.1*  --   --   --   HGB 14.3 12.9* 12.5* 12.9*  HCT 43.0 39.5 38.7* 40.3  MCV 92.7 92.9 94.2 95.7  PLT 173 151 164 183   Cardiac Enzymes:  Recent Labs Lab 04/25/15 1417  TROPONINI <0.03    CBG: No results for input(s): GLUCAP in  the last 168 hours.   Radiological Exams on Admission: Dg Chest Port 1 View  04/27/2015  CLINICAL DATA:  54 year old male with history of acute renal failure and hypoxemia. Evaluate endotracheal tube placement. EXAM: PORTABLE CHEST 1 VIEW COMPARISON:  Chest x-ray 04/25/2015. FINDINGS: An endotracheal tube is in place with tip 6.2 cm above the carina. A nasogastric tube is seen extending into the stomach, however, the tip of the nasogastric tube extends below the lower margin of the image. Lung volumes are normal. Persistent airspace consolidation in the left upper lobe appears very similar to the recent prior examination. Diffuse peribronchial cuffing. No pleural effusions. No definite cephalization of the pulmonary vasculature. Heart size is normal. Upper mediastinal contours are within normal limits. IMPRESSION: 1. Support apparatus, as above. 2. Persistent left upper lobe airspace consolidation concerning for pneumonia. 3. In addition, there is diffuse peribronchial cuffing, suggesting concurrent bronchitis. Electronically Signed   By: Trudie Reed M.D.   On: 04/27/2015 00:49    Assessment/Plan Present on Admission:  . COPD exacerbation (HCC) . Acute respiratory failure with hypoxia (HCC) . Tobacco dependence . Community acquired pneumonia   Acute Hypoxemic Respiratory Failure Still has significant wheezing.  Will continue high dose steroids, give hour nebs.  D/C IVF, Give Lasix x 1. -Currently satting upper 90s on 40% VM. -2/2 CAP and COPD exacerbation. -See below for details.  CAP -Continue rocephin/azithro. -Cx data remains negative to date.  COPD with Acute Exacerbation -Continue steroids at current dose. -Add low dose BDZ as he appears to be quite anxious.  Chronic pain syndrome:  Will give IV Morphine PRN.   Tobacco Abuse -Counseled on cessation.  Code Status: Full Code Family Communication: None  Disposition Plan: Keep in ICU today   Other plans as per  orders.   Houston Siren, MD. [facility] Pager [phone] 7pm to 7am.  04/28/2015, 8:14 AM

## 2015-04-29 MED ORDER — HYDROMORPHONE HCL 2 MG PO TABS
2.0000 mg | ORAL_TABLET | Freq: Four times a day (QID) | ORAL | Status: DC | PRN
  Administered 2015-04-29 – 2015-05-03 (×7): 2 mg via ORAL
  Filled 2015-04-29 (×9): qty 1

## 2015-04-29 MED ORDER — FLEET ENEMA 7-19 GM/118ML RE ENEM
1.0000 | ENEMA | Freq: Once | RECTAL | Status: AC
  Administered 2015-04-30: 1 via RECTAL

## 2015-04-29 MED ORDER — METHYLPREDNISOLONE SODIUM SUCC 125 MG IJ SOLR
80.0000 mg | Freq: Four times a day (QID) | INTRAMUSCULAR | Status: DC
  Administered 2015-04-29 – 2015-04-30 (×4): 80 mg via INTRAVENOUS
  Filled 2015-04-29 (×4): qty 2

## 2015-04-29 MED ORDER — POLYETHYLENE GLYCOL 3350 17 G PO PACK
17.0000 g | PACK | Freq: Every day | ORAL | Status: DC
  Administered 2015-04-29 – 2015-05-03 (×5): 17 g via ORAL
  Filled 2015-04-29 (×5): qty 1

## 2015-04-29 MED ORDER — TERBUTALINE SULFATE 5 MG PO TABS
2.5000 mg | ORAL_TABLET | Freq: Four times a day (QID) | ORAL | Status: DC
  Administered 2015-04-29 – 2015-04-30 (×4): 2.5 mg via ORAL
  Filled 2015-04-29 (×5): qty 1

## 2015-04-29 NOTE — Progress Notes (Signed)
Wasted 250 ml's fentanyl on 04/29/2015 in sink witnessed by Laury AxonMichael Gray RN.

## 2015-04-29 NOTE — Progress Notes (Signed)
[facility] PROGRESS NOTE  [name] Carter WGN:[id number] DOB: 09-21-1960    PCP:   [name]   HPI:  [name] Carter is an 54 y.o. male with hx of severe COPD, active tobacco abuse, HTN, chronic pain on chronic narcotics, admitted for hypoxic respiratory failure requiring intubation, extubated yesterday, on Rocephin and Zithromax for CAP, complaining of pain and SOB. He is currently on Solumedrol at  IV Q 6 hours, and has significant wheezing still, though better.  Morphine shot given yesterday was helpful, and he requested again today. He is followed by Dr Juanetta Gosling as well.   Rewiew of Systems:  Constitutional: Negative for malaise, fever and chills. No significant weight loss or weight gain Eyes: Negative for eye pain, redness and discharge, diplopia, visual changes, or flashes of light. ENMT: Negative for ear pain, hoarseness, nasal congestion, sinus pressure and sore throat. No headaches; tinnitus, drooling, or problem swallowing. Cardiovascular: Negative for chest pain, palpitations, diaphoresis, and peripheral edema. ; No orthopnea, PND Respiratory: Negative for cough, hemoptysis, wheezing and stridor. No pleuritic chestpain. Gastrointestinal: Negative for nausea, vomiting, diarrhea, constipation, abdominal pain, melena, blood in stool, hematemesis, jaundice and rectal bleeding.    Genitourinary: Negative for frequency, dysuria, incontinence,flank pain and hematuria; Musculoskeletal: Negative for back pain and neck pain. Negative for swelling and trauma.;  Skin: . Negative for pruritus, rash, abrasions, bruising and skin lesion.; ulcerations Neuro: Negative for headache, lightheadedness and neck stiffness. Negative for weakness, altered level of consciousness , altered mental status, extremity weakness, burning feet, involuntary movement, seizure and syncope.  Psych: negative for anxiety, depression, insomnia, tearfulness, panic attacks, hallucinations,  paranoia, suicidal or homicidal ideation    Past Medical History  Diagnosis Date  . COPD (chronic obstructive pulmonary disease) (HCC)   . Pneumonia   . Chronic back pain   . DDD (degenerative disc disease), lumbar   . DDD (degenerative disc disease), cervical   . Neck pain   . HTN (hypertension)     Past Surgical History  Procedure Laterality Date  . Back surgery      low back x4  . Bilateral carpal tunnel release    . Ulnar nerve transposition      ? by description, not clear  . Bilteral knee surgery    . Esophagogastroduodenoscopy  07/2012    [place] MD: MAC. Chronic gastritis and intestinal metaplasia, ?metaplastic atrophic gastritis, negative H.pylori, no celiac, duidnal mucosa with mild Brunner gland hyperplasia, nonspecific.   . Colonoscopy  07/2012    [place] MD: MAC. Random colon biopsies benign. Rectal polyp tubular adenoma.    Medications:  HOME MEDS: Prior to Admission medications   Medication Sig Start Date End Date Taking? Authorizing Provider  albuterol (PROVENTIL HFA;VENTOLIN HFA) 108 (90 BASE) MCG/ACT inhaler Inhale 2 puffs into the lungs every 4 (four) hours as needed for wheezing or shortness of breath. 08/21/14  Yes Standley Brooking, MD  albuterol (PROVENTIL) (2.5 MG/3ML) 0.083% nebulizer solution Take 3 mLs (2.5 mg total) by nebulization every 4 (four) hours as needed for wheezing or shortness of breath. 05/16/14  Yes Standley Brooking, MD  cyclobenzaprine (FLEXERIL) 10 MG tablet Take 10 mg by mouth at bedtime.   Yes Historical Provider, MD  DULoxetine (CYMBALTA) 30 MG capsule Take 30 mg by mouth 2 (two) times daily.   Yes Historical Provider, MD  Ipratropium-Albuterol (COMBIVENT) 20-100 MCG/ACT AERS respimat Inhale 1 puff into the lungs 4 (four) times daily. 08/21/14  Yes Standley Brooking, MD  Linaclotide (  LINZESS) 145 MCG CAPS capsule Take 1 capsule (145 mcg total) by mouth daily before breakfast. For constipation 02/12/15  Yes Tiffany Kocher, PA-C   oxyCODONE-acetaminophen (PERCOCET) 10-325 MG per tablet Take 1 tablet by mouth every 6 (six) hours as needed (severe pain). 08/21/14  Yes Standley Brooking, MD  pantoprazole (PROTONIX) 40 MG tablet Take 1 tablet (40 mg total) by mouth daily before breakfast. 02/12/15  Yes Tiffany Kocher, PA-C  promethazine (PHENERGAN) 12.5 MG tablet 1 po q6h prn nausea Patient taking differently: Take 12.5 mg by mouth every 6 (six) hours as needed for nausea. 1 po q6h prn nausea 06/22/14  Yes Ivery Quale, PA-C  traZODone (DESYREL) 100 MG tablet Take 100 mg by mouth at bedtime.   Yes Historical Provider, MD  Spacer/Aero-Holding Chambers (AEROCHAMBER PLUS WITH MASK) inhaler Use as instructed 05/05/14   Doug Sou, MD     Allergies:  Allergies  Allergen Reactions  . Vicodin [Hydrocodone-Acetaminophen] Nausea And Vomiting and Other (See Comments)    Made patient feel funny, stomach pain    Social History:   reports that he has been smoking Cigarettes.  He has been smoking about 1.00 pack per day. He does not have any smokeless tobacco history on file. He reports that he does not drink alcohol or use illicit drugs.  Family History: Family History  Problem Relation Age of Onset  . Heart attack Father   . Colon cancer Father     diagnosed in his 22s     Physical Exam: Filed Vitals:   04/29/15 0600 04/29/15 0700 04/29/15 0808 04/29/15 0828  BP: 113/76 108/74    Pulse: 69 66    Temp:   97.7 F (36.5 C)   TempSrc:   Oral   Resp: 11     Height:      Weight:      SpO2: 96% 97%  98%   Blood pressure 108/74, pulse 66, temperature 97.7 F (36.5 C), temperature source Oral, resp. rate 11, height  (1.753 m), weight 86.4 kg (190 lb 7.6 oz), SpO2 98 %.  GEN:  Pleasant  patient lying in the stretcher in no acute distress; cooperative with exam. PSYCH:  alert and oriented x4; does not appear anxious or depressed; affect is appropriate. HEENT: Mucous membranes pink and anicteric; PERRLA; EOM intact;  no cervical lymphadenopathy nor thyromegaly or carotid bruit; no JVD; There were no stridor. Neck is very supple. Breasts:: Not examined CHEST WALL: No tenderness CHEST: Normal respiration, bilateral wheezing, with no rales.  HEART: Regular rate and rhythm.  There are no murmur, rub, or gallops.   BACK: No kyphosis or scoliosis; no CVA tenderness ABDOMEN: soft and non-tender; no masses, no organomegaly, normal abdominal bowel sounds; no pannus; no intertriginous candida. There is no rebound and no distention. Rectal Exam: Not done EXTREMITIES: No bone or joint deformity; age-appropriate arthropathy of the hands and knees; no edema; no ulcerations.  There is no calf tenderness. Genitalia: not examined PULSES: 2+ and symmetric SKIN: Normal hydration no rash or ulceration CNS: Cranial nerves 2-12 grossly intact no focal lateralizing neurologic deficit.  Speech is fluent; uvula elevated with phonation, facial symmetry and tongue midline. DTR are normal bilaterally, cerebella exam is intact, barbinski is negative and strengths are equaled bilaterally.  No sensory loss.   Labs on Admission:  Basic Metabolic Panel:  Recent Labs Lab 04/25/15 1417 04/26/15 0459 04/27/15 0411 04/28/15 0450  NA 138 138 144 143  K 3.7 4.4 4.0 3.7  CL 105 110 116* 112*  CO2 24 22 23 24   GLUCOSE 90 164* 146* 157*  BUN 11 12 13 15   CREATININE 1.15 0.79 0.69 0.82  CALCIUM 8.8* 8.0* 8.1* 7.9*   Liver Function Tests:  Recent Labs Lab 04/26/15 0459  AST 26  ALT 17  ALKPHOS 64  BILITOT 0.6  PROT 5.7*  ALBUMIN 3.1*   CBC:  Recent Labs Lab 04/25/15 1417 04/26/15 0459 04/27/15 0411 04/28/15 0450  WBC 11.4* 7.5 10.7* 17.8*  NEUTROABS 9.1*  --   --   --   HGB 14.3 12.9* 12.5* 12.9*  HCT 43.0 39.5 38.7* 40.3  MCV 92.7 92.9 94.2 95.7  PLT 173 151 164 183   Cardiac Enzymes:  Recent Labs Lab 04/25/15 1417  TROPONINI <0.03   Assessment/Plan Present on Admission:  . COPD exacerbation (HCC) .  Acute respiratory failure with hypoxia (HCC) . Tobacco dependence . Community acquired pneumonia  Acute Hypoxemic Respiratory Failure Still has significant wheezing. Will decrease IV Steroid, add Terbutaline orally QID.  D/C IVF, Give Lasix x 1. -Currently satting upper 90s on 40% VM. -2/2 CAP and COPD exacerbation. -See below for details.  CAP -Continue rocephin/azithro. -Cx data remains negative to date.  COPD with Acute Exacerbation -Continue steroids with tapering.  -Add low dose BDZ as he appears to be quite anxious.  Chronic pain syndrome: Will continue IV Morphine PRN.  Add Dilaudid orally.   Tobacco Abuse -Counseled on cessation.  Code Status: Full Code Family Communication: None  Disposition Plan: Keep in ICU today   Faylinn Schwenn, MD. [facility] Pager [phone] 7pm to 7am.  04/29/2015, 9:18 AM

## 2015-04-29 NOTE — Progress Notes (Signed)
Subjective: He says he feels better. He still complaining of significant pain. He still has cough and wheezing.  Objective: Vital signs in last 24 hours: Temp:  [97 F (36.1 C)-98.5 F (36.9 C)] 97.7 F (36.5 C) (10/27 0808) Pulse Rate:  [66-124] 66 (10/27 0700) Resp:  [11-20] 11 (10/27 0600) BP: (108-144)/(70-127) 108/74 mmHg (10/27 0700) SpO2:  [80 %-100 %] 97 % (10/27 0700) Weight:  [86.4 kg (190 lb 7.6 oz)] 86.4 kg (190 lb 7.6 oz) (10/27 0500) Weight change: -0.5 kg (-1 lb 1.6 oz) Last BM Date: 04/25/15  Intake/Output from previous day: 10/26 0701 - 10/27 0700 In: 1835 [P.O.:1560; I.V.:125; IV Piggyback:150] Out: 4775 [Urine:4775]  PHYSICAL EXAM General appearance: alert, cooperative and mild distress Resp: Bilateral wheezes and rhonchi but he is moving air well Cardio: regular rate and rhythm, S1, S2 normal, no murmur, click, rub or gallop GI: soft, non-tender; bowel sounds normal; no masses,  no organomegaly Extremities: extremities normal, atraumatic, no cyanosis or edema  Lab Results:  Results for orders placed or performed during the hospital encounter of 04/25/15 (from the past 48 hour(s))  Blood gas, arterial     Status: Abnormal   Collection Time: 04/27/15  8:35 AM  Result Value Ref Range   FIO2 40.00    Delivery systems VENTILATOR    Mode PRESSURE REGULATED VOLUME CONTROL    VT 560 mL   LHR 16 resp/min   Peep/cpap 5.0 cm H20   Pressure support 5 cm H20   pH, Arterial 7.347 (L) 7.350 - 7.450   pCO2 arterial 39.4 35.0 - 45.0 mmHg   pO2, Arterial 104 (H) 80.0 - 100.0 mmHg   Bicarbonate 21.3 20.0 - 24.0 mEq/L   Acid-Base Excess 3.7 (H) 0.0 - 2.0 mmol/L   O2 Saturation 97.2 %   Collection site LEFT RADIAL    Drawn by [id number]    Sample type ARTERIAL    Allens test (pass/fail) PASS PASS  Basic metabolic panel     Status: Abnormal   Collection Time: 04/28/15  4:50 AM  Result Value Ref Range   Sodium 143 135 - 145 mmol/L   Potassium 3.7 3.5 - 5.1 mmol/L   Chloride 112 (H) 101 - 111 mmol/L   CO2 24 22 - 32 mmol/L   Glucose, Bld 157 (H) 65 - 99 mg/dL   BUN 15 6 - 20 mg/dL   Creatinine, Ser 0.82 0.61 - 1.24 mg/dL   Calcium 7.9 (L) 8.9 - 10.3 mg/dL   GFR calc non Af Amer >60 >60 mL/min   GFR calc Af Amer >60 >60 mL/min    Comment: (NOTE) The eGFR has been calculated using the CKD EPI equation. This calculation has not been validated in all clinical situations. eGFR's persistently <60 mL/min signify possible Chronic Kidney Disease.    Anion gap 7 5 - 15  CBC     Status: Abnormal   Collection Time: 04/28/15  4:50 AM  Result Value Ref Range   WBC 17.8 (H) 4.0 - 10.5 K/uL   RBC 4.21 (L) 4.22 - 5.81 MIL/uL   Hemoglobin 12.9 (L) 13.0 - 17.0 g/dL   HCT 40.3 39.0 - 52.0 %   MCV 95.7 78.0 - 100.0 fL   MCH 30.6 26.0 - 34.0 pg   MCHC 32.0 30.0 - 36.0 g/dL   RDW 13.8 11.5 - 15.5 %   Platelets 183 150 - 400 K/uL    ABGS  Recent Labs  04/27/15 0835  PHART 7.347*  PO2ART 104*  HCO3 21.3   CULTURES Recent Results (from the past 240 hour(s))  Blood Culture (routine x 2)     Status: None (Preliminary result)   Collection Time: 04/25/15  2:51 PM  Result Value Ref Range Status   Specimen Description BLOOD RIGHT HAND DRAWN BY RN  Final   Special Requests BOTTLES DRAWN AEROBIC AND ANAEROBIC [place]  Final   Culture NO GROWTH 3 DAYS  Final   Report Status PENDING  Incomplete  Urine culture     Status: None   Collection Time: 04/25/15  2:51 PM  Result Value Ref Range Status   Specimen Description URINE, CATHETERIZED  Final   Special Requests NONE  Final   Culture   Final    NO GROWTH 2 DAYS Performed at [name]    Report Status 04/27/2015 FINAL  Final  Blood Culture (routine x 2)     Status: None (Preliminary result)   Collection Time: 04/25/15  2:59 PM  Result Value Ref Range Status   Specimen Description BLOOD LEFT HAND  Final   Special Requests BOTTLES DRAWN AEROBIC AND ANAEROBIC [place]  Final   Culture NO GROWTH 3  DAYS  Final   Report Status PENDING  Incomplete  MRSA PCR Screening     Status: None   Collection Time: 04/25/15  5:35 PM  Result Value Ref Range Status   MRSA by PCR NEGATIVE NEGATIVE Final    Comment:        The GeneXpert MRSA Assay (FDA approved for NASAL specimens only), is one component of a comprehensive MRSA colonization surveillance program. It is not intended to diagnose MRSA infection nor to guide or monitor treatment for MRSA infections.   Culture, respiratory (NON-Expectorated)     Status: None   Collection Time: 04/25/15 11:45 PM  Result Value Ref Range Status   Specimen Description SPUTUM  Final   Special Requests NONE  Final   Gram Stain   Final    FEW WBC PRESENT,BOTH PMN AND MONONUCLEAR RARE SQUAMOUS EPITHELIAL CELLS PRESENT NO ORGANISMS SEEN Performed at [facility]    Culture   Final    NORMAL OROPHARYNGEAL FLORA Performed at [facility]    Report Status 04/28/2015 FINAL  Final   Studies/Results: No results found.  Medications:  Prior to Admission:  Prescriptions prior to admission  Medication Sig Dispense Refill Last Dose  . albuterol (PROVENTIL HFA;VENTOLIN HFA) 108 (90 BASE) MCG/ACT inhaler Inhale 2 puffs into the lungs every 4 (four) hours as needed for wheezing or shortness of breath. 1 Inhaler 1 04/25/2015 at Unknown time  . albuterol (PROVENTIL) (2.5 MG/3ML) 0.083% nebulizer solution Take 3 mLs (2.5 mg total) by nebulization every 4 (four) hours as needed for wheezing or shortness of breath.   04/25/2015 at Unknown time  . cyclobenzaprine (FLEXERIL) 10 MG tablet Take 10 mg by mouth at bedtime.   04/24/2015 at Unknown time  . DULoxetine (CYMBALTA) 30 MG capsule Take 30 mg by mouth 2 (two) times daily.   04/25/2015 at Unknown time  . Ipratropium-Albuterol (COMBIVENT) 20-100 MCG/ACT AERS respimat Inhale 1 puff into the lungs 4 (four) times daily. 1 Inhaler 1 04/25/2015 at Unknown time  . Linaclotide (LINZESS) 145 MCG CAPS capsule  Take 1 capsule (145 mcg total) by mouth daily before breakfast. For constipation 30 capsule 5 04/24/2015 at Unknown time  . oxyCODONE-acetaminophen (PERCOCET) 10-325 MG per tablet Take 1 tablet by mouth every 6 (six) hours as needed (severe pain). [place]  tablet 0 Past Week at Unknown time  . pantoprazole (PROTONIX) 40 MG tablet Take 1 tablet (40 mg total) by mouth daily before breakfast. 30 tablet 5 04/24/2015 at Unknown time  . promethazine (PHENERGAN) 12.5 MG tablet 1 po q6h prn nausea (Patient taking differently: Take 12.5 mg by mouth every 6 (six) hours as needed for nausea. 1 po q6h prn nausea) 12 tablet 0 unknown  . traZODone (DESYREL) 100 MG tablet Take 100 mg by mouth at bedtime.   04/24/2015 at Unknown time  . Spacer/Aero-Holding Chambers (AEROCHAMBER PLUS WITH MASK) inhaler Use as instructed 1 each 2 Taking   Scheduled: . antiseptic oral rinse  7 mL Mouth Rinse QID  . chlorhexidine gluconate  15 mL Mouth Rinse BID  . docusate sodium  100 mg Oral BID  . enoxaparin (LOVENOX) injection  40 mg Subcutaneous Q24H  . famotidine (PEPCID) IV  20 mg Intravenous Q12H  . feeding supplement  1 Container Oral TID BM  . guaiFENesin  1,200 mg Oral BID  . Influenza vac split quadrivalent PF  0.5 mL Intramuscular Tomorrow-1000  . ipratropium-albuterol  3 mL Nebulization Q4H  . levofloxacin (LEVAQUIN) IV  750 mg Intravenous Q24H  . methylPREDNISolone (SOLU-MEDROL) injection  125 mg Intravenous Q6H   Continuous: . albuterol 7.5 mg/hr (04/28/15 1141)   JSE:[id number], ALPRAZolam, bisacodyl, morphine injection, oxyCODONE-acetaminophen  Assesment: He was admitted with acute on chronic hypoxic respiratory failure requiring ventilator support. He has community-acquired pneumonia. At baseline he has severe COPD and ongoing tobacco use. He says that even when he's at his best he still has a lot of wheezing and shortness of breath. I think he is probably not back to baseline yet although I don't know him very  well. He hopes to be transferred from the ICU today Active Problems:   Tobacco dependence   Acute respiratory failure with hypoxia (HCC)   COPD exacerbation ([place])   Community acquired pneumonia    Plan: Continue current treatments. Potential transfer from the ICU which is okay from a pulmonary point of view.    LOS: 4 days   Shevaun Lovan L 04/29/2015, 8:26 AM

## 2015-04-30 LAB — CULTURE, BLOOD (ROUTINE X 2)
CULTURE: NO GROWTH
Culture: NO GROWTH

## 2015-04-30 MED ORDER — PANTOPRAZOLE SODIUM 40 MG PO TBEC
40.0000 mg | DELAYED_RELEASE_TABLET | Freq: Every day | ORAL | Status: DC
  Administered 2015-05-01 – 2015-05-03 (×3): 40 mg via ORAL
  Filled 2015-04-30 (×3): qty 1

## 2015-04-30 MED ORDER — METHYLPREDNISOLONE SODIUM SUCC 40 MG IJ SOLR
40.0000 mg | Freq: Four times a day (QID) | INTRAMUSCULAR | Status: DC
  Administered 2015-04-30 – 2015-05-02 (×8): 40 mg via INTRAVENOUS
  Filled 2015-04-30 (×8): qty 1

## 2015-04-30 MED ORDER — LINACLOTIDE 145 MCG PO CAPS
145.0000 ug | ORAL_CAPSULE | Freq: Every day | ORAL | Status: DC
  Administered 2015-05-01 – 2015-05-02 (×2): 145 ug via ORAL
  Filled 2015-04-30 (×7): qty 1

## 2015-04-30 MED ORDER — CETYLPYRIDINIUM CHLORIDE 0.05 % MT LIQD: 7.0000 mL | Freq: Two times a day (BID) | OROMUCOSAL | Status: DC

## 2015-04-30 MED ORDER — TERBUTALINE SULFATE 5 MG PO TABS
5.0000 mg | ORAL_TABLET | Freq: Four times a day (QID) | ORAL | Status: DC
  Administered 2015-04-30 – 2015-05-03 (×12): 5 mg via ORAL
  Filled 2015-04-30 (×21): qty 1

## 2015-04-30 MED ORDER — LEVOFLOXACIN 750 MG PO TABS
750.0000 mg | ORAL_TABLET | Freq: Every day | ORAL | Status: DC
  Administered 2015-04-30 – 2015-05-03 (×4): 750 mg via ORAL
  Filled 2015-04-30 (×4): qty 1

## 2015-04-30 NOTE — Care Management Note (Signed)
Case Management Note  Patient Details  Name: [name] Carter MRN: [id number] Date of Birth: 10/17/1960   Expected Discharge Date:  04/30/15               Expected Discharge Plan:  Home/Self Care  In-House Referral:  NA  Discharge planning Services  CM Consult  Post Acute Care Choice:  NA Choice offered to:  NA  DME Arranged:    DME Agency:     HH Arranged:    HH Agency:     Status of Service:  In process, will continue to follow  Medicare Important Message Given:    Date Medicare IM Given:    Medicare IM give by:    Date Additional Medicare IM Given:    Additional Medicare Important Message give by:     If discussed at Long Length of Stay Meetings, dates discussed:    Additional Comments: Pt will need home O2 assessment prior to DC.  [name] Carter, [name] Pegues Demske, RN 04/30/2015, 1:09 PM

## 2015-04-30 NOTE — Progress Notes (Signed)
Called report to [name] LukeLisa Covington, RN on dept 300. Verbalized understanding.  Pt transferred to room 319 in safe and stable condition.

## 2015-04-30 NOTE — Progress Notes (Signed)
Pt. Brought from ICU. Stable. Ambulated in hallway with Respiratory.

## 2015-04-30 NOTE — Progress Notes (Signed)
Subjective: He says he feels better. He is concerned that he is going to need oxygen at home and I think that is very likely. He is still having some cough and wheezing  Objective: Vital signs in last 24 hours: Temp:  [97.1 F (36.2 C)-99 F (37.2 C)] 97.6 F (36.4 C) (10/28 0746) Pulse Rate:  [69-94] 74 (10/28 0700) Resp:  [13-22] 17 (10/28 0700) BP: (118-146)/(76-95) 135/83 mmHg (10/28 0700) SpO2:  [90 %-100 %] 94 % (10/28 0750) Weight:  [90.1 kg (198 lb 10.2 oz)] 90.1 kg (198 lb 10.2 oz) (10/28 0500) Weight change: 3.7 kg (8 lb 2.5 oz) Last BM Date: 04/30/15  Intake/Output from previous day: 10/27 0701 - 10/28 0700 In: 1240 [P.O.:840; IV Piggyback:400] Out: 1701 [Urine:1700; Stool:1]  PHYSICAL EXAM General appearance: alert, cooperative and mild distress Resp: wheezes bilaterally Cardio: regular rate and rhythm, S1, S2 normal, no murmur, click, rub or gallop GI: soft, non-tender; bowel sounds normal; no masses,  no organomegaly Extremities: extremities normal, atraumatic, no cyanosis or edema  Lab Results:  No results found for this or any previous visit (from the past 48 hour(s)).  ABGS No results for input(s): PHART, PO2ART, TCO2, HCO3 in the last 72 hours.  Invalid input(s): PCO2 CULTURES Recent Results (from the past 240 hour(s))  Blood Culture (routine x 2)     Status: None   Collection Time: 04/25/15  2:51 PM  Result Value Ref Range Status   Specimen Description BLOOD RIGHT HAND DRAWN BY RN  Final   Special Requests BOTTLES DRAWN AEROBIC AND ANAEROBIC 8CC EACH  Final   Culture NO GROWTH 5 DAYS  Final   Report Status 04/30/2015 FINAL  Final  Urine culture     Status: None   Collection Time: 04/25/15  2:51 PM  Result Value Ref Range Status   Specimen Description URINE, CATHETERIZED  Final   Special Requests NONE  Final   Culture   Final    NO GROWTH 2 DAYS [facility]    Report Status 04/27/2015 FINAL  Final  Blood Culture (routine x  2)     Status: None   Collection Time: 04/25/15  2:59 PM  Result Value Ref Range Status   Specimen Description BLOOD LEFT HAND  Final   Special Requests BOTTLES DRAWN AEROBIC AND ANAEROBIC 8CC EACH  Final   Culture NO GROWTH 5 DAYS  Final   Report Status 04/30/2015 FINAL  Final  MRSA PCR Screening     Status: None   Collection Time: 04/25/15  5:35 PM  Result Value Ref Range Status   MRSA by PCR NEGATIVE NEGATIVE Final    Comment:        The GeneXpert MRSA Assay (FDA approved for NASAL specimens only), is one component of a comprehensive MRSA colonization surveillance program. It is not intended to diagnose MRSA infection nor to guide or monitor treatment for MRSA infections.   Culture, respiratory (NON-Expectorated)     Status: None   Collection Time: 04/25/15 11:45 PM  Result Value Ref Range Status   Specimen Description SPUTUM  Final   Special Requests NONE  Final   Gram Stain   Final    FEW WBC PRESENT,BOTH PMN AND MONONUCLEAR RARE SQUAMOUS EPITHELIAL CELLS PRESENT NO ORGANISMS SEEN [facility]formed at [facility]    Culture   Final    NORMAL OROPHARYNGEAL FLORA Performed at [facility]    Report Status 04/28/2015 FINAL  Final   Studies/Results: No results  found.  Medications:  Prior to Admission:  Prescriptions prior to admission  Medication Sig Dispense Refill Last Dose  . albuterol (PROVENTIL HFA;VENTOLIN HFA) 108 (90 BASE) MCG/ACT inhaler Inhale 2 puffs into the lungs every 4 (four) hours as needed for wheezing or shortness of breath. 1 Inhaler 1 04/25/2015 at Unknown time  . albuterol (PROVENTIL) (2.5 MG/3ML) 0.083% nebulizer solution Take 3 mLs (2.5 mg total) by nebulization every 4 (four) hours as needed for wheezing or shortness of breath.   04/25/2015 at Unknown time  . cyclobenzaprine (FLEXERIL) 10 MG tablet Take 10 mg by mouth at bedtime.   04/24/2015 at Unknown time  . DULoxetine (CYMBALTA) 30 MG capsule Take 30 mg by mouth 2 (two) times  daily.   04/25/2015 at Unknown time  . Ipratropium-Albuterol (COMBIVENT) 20-100 MCG/ACT AERS respimat Inhale 1 puff into the lungs 4 (four) times daily. 1 Inhaler 1 04/25/2015 at Unknown time  . Linaclotide (LINZESS) 145 MCG CAPS capsule Take 1 capsule (145 mcg total) by mouth daily before breakfast. For constipation 30 capsule 5 04/24/2015 at Unknown time  . oxyCODONE-acetaminophen (PERCOCET) 10-325 MG per tablet Take 1 tablet by mouth every 6 (six) hours as needed (severe pain). 20 tablet 0 Past Week at Unknown time  . pantoprazole (PROTONIX) 40 MG tablet Take 1 tablet (40 mg total) by mouth daily before breakfast. 30 tablet 5 04/24/2015 at Unknown time  . promethazine (PHENERGAN) 12.5 MG tablet 1 po q6h prn nausea (Patient taking differently: Take 12.5 mg by mouth every 6 (six) hours as needed for nausea. 1 po q6h prn nausea) 12 tablet 0 unknown  . traZODone (DESYREL) 100 MG tablet Take 100 mg by mouth at bedtime.   04/24/2015 at Unknown time  . Spacer/Aero-Holding Chambers (AEROCHAMBER PLUS WITH MASK) inhaler Use as instructed 1 each 2 Taking   Scheduled: . docusate sodium  100 mg Oral BID  . enoxaparin (LOVENOX) injection  40 mg Subcutaneous Q24H  . famotidine (PEPCID) IV  20 mg Intravenous Q12H  . feeding supplement  1 Container Oral TID BM  . guaiFENesin  1,200 mg Oral BID  . ipratropium-albuterol  3 mL Nebulization Q4H  . levofloxacin  750 mg Oral Daily  . Linaclotide  145 mcg Oral Daily  . methylPREDNISolone (SOLU-MEDROL) injection  40 mg Intravenous Q6H  . polyethylene glycol  17 g Oral Daily  . terbutaline  5 mg Oral 4 times per day   Continuous: . albuterol 7.5 mg/hr (04/28/15 1141)   ZOX:[id number], ALPRAZolam, bisacodyl, HYDROmorphone, morphine injection  Assesment: He has acute hypoxic respiratory failure and he required intubation and mechanical ventilation but that has improved. At baseline he has severe COPD. He has community-acquired pneumonia which is improving. I don't  know him well but he says he has wheezing all the time. Active Problems:   Tobacco dependence   Acute respiratory failure with hypoxia (HCC)   COPD exacerbation (HCC)   Community acquired pneumonia    Plan: Continue current treatments. Discussed the need for CT chest with Dr. Conley Rolls and I think he may need that probably the screening test for lung cancer as an outpatient. I would continue other treatments for now.    LOS: 5 days   Vaishali Baise L 04/30/2015, 9:14 AM

## 2015-04-30 NOTE — Progress Notes (Signed)
**Note De-Identified Adelei Scobey Obfuscation** Patient presented good effort while walking with RRT for 2 minutes. SAT 84% on 4L with HR 115.  Patient SOB and audible wheezing.  RRT to continue to monitor.

## 2015-04-30 NOTE — Progress Notes (Signed)
[facility] PROGRESS NOTE  [name] Carter NUU:[id number] DOB: 1961/06/06    PCP:   [name]   HPI:  [name] Carter is an 54 y.o. male with hx of severe COPD, active tobacco abuse, HTN, chronic pain on chronic narcotics, admitted for hypoxic respiratory failure requiring intubation, subsequently extubated, on Rocephin and Zithromax for CAP, complaining of pain and SOB. He is currently on Solumedrol at 80 IV Q 6 hours, and has significant wheezing still, though better. He is followed by Dr Juanetta Gosling as well. Patient is doing a little better today.  Oral Terbutaline was started yesterday.   Rewiew of Systems:  Constitutional: Negative for malaise, fever and chills. No significant weight loss or weight gain Eyes: Negative for eye pain, redness and discharge, diplopia, visual changes, or flashes of light. ENMT: Negative for ear pain, hoarseness, nasal congestion, sinus pressure and sore throat. No headaches; tinnitus, drooling, or problem swallowing. Cardiovascular: Negative for chest pain, palpitations, diaphoresis,  and peripheral edema. ; No orthopnea, PND Respiratory: Negative for cough, hemoptysis, wheezing and stridor. No pleuritic chestpain. Gastrointestinal: Negative for nausea, vomiting, diarrhea, constipation, abdominal pain, melena, blood in stool, hematemesis, jaundice and rectal bleeding.    Genitourinary: Negative for frequency, dysuria, incontinence,flank pain and hematuria; Musculoskeletal: Negative for back pain and neck pain. Negative for swelling and trauma.;  Skin: . Negative for pruritus, rash, abrasions, bruising and skin lesion.; ulcerations Neuro: Negative for headache, lightheadedness and neck stiffness. Negative for weakness, altered level of consciousness , altered mental status, extremity weakness, burning feet, involuntary movement, seizure and syncope.  Psych: negative for anxiety, depression, insomnia, tearfulness, panic attacks, hallucinations,  paranoia, suicidal or homicidal ideation   Past Medical History  Diagnosis Date  . COPD (chronic obstructive pulmonary disease) (HCC)   . Pneumonia   . Chronic back pain   . DDD (degenerative disc disease), lumbar   . DDD (degenerative disc disease), cervical   . Neck pain   . HTN (hypertension)     Past Surgical History  Procedure Laterality Date  . Back surgery      low back x4  . Bilateral carpal tunnel release    . Ulnar nerve transposition      ? by description, not clear  . Bilteral knee surgery    . Esophagogastroduodenoscopy  07/2012    [place] MD: MAC. Chronic gastritis and intestinal metaplasia, ?metaplastic atrophic gastritis, negative H.pylori, no celiac, duidnal mucosa with mild Brunner gland hyperplasia, nonspecific.   . Colonoscopy  07/2012    [place] MD: MAC. Random colon biopsies benign. Rectal polyp tubular adenoma.    Medications:  HOME MEDS: Prior to Admission medications   Medication Sig Start Date End Date Taking? Authorizing Provider  albuterol (PROVENTIL HFA;VENTOLIN HFA) 108 (90 BASE) MCG/ACT inhaler Inhale 2 puffs into the lungs every 4 (four) hours as needed for wheezing or shortness of breath. 08/21/14  Yes Standley Brooking, MD  albuterol (PROVENTIL) (2.5 MG/3ML) 0.083% nebulizer solution Take 3 mLs (2.5 mg total) by nebulization every 4 (four) hours as needed for wheezing or shortness of breath. 05/16/14  Yes Standley Brooking, MD  cyclobenzaprine (FLEXERIL) 10 MG tablet Take 10 mg by mouth at bedtime.   Yes Historical Provider, MD  DULoxetine (CYMBALTA) 30 MG capsule Take 30 mg by mouth 2 (two) times daily.   Yes Historical Provider, MD  Ipratropium-Albuterol (COMBIVENT) 20-100 MCG/ACT AERS respimat Inhale 1 puff into the lungs 4 (four) times daily. 08/21/14  Yes Standley Brooking, MD  Linaclotide (LINZESS) 145 MCG CAPS capsule Take 1 capsule (145 mcg total) by mouth daily before breakfast. For constipation 02/12/15  Yes Tiffany KocherLeslie S Lewis, PA-C   oxyCODONE-acetaminophen (PERCOCET) 10-325 MG per tablet Take 1 tablet by mouth every 6 (six) hours as needed (severe pain). 08/21/14  Yes Standley Brookinganiel P Goodrich, MD  pantoprazole (PROTONIX) 40 MG tablet Take 1 tablet (40 mg total) by mouth daily before breakfast. 02/12/15  Yes Tiffany KocherLeslie S Lewis, PA-C  promethazine (PHENERGAN) 12.5 MG tablet 1 po q6h prn nausea Patient taking differently: Take 12.5 mg by mouth every 6 (six) hours as needed for nausea. 1 po q6h prn nausea 06/22/14  Yes Ivery QualeHobson Bryant, PA-C  traZODone (DESYREL) 100 MG tablet Take 100 mg by mouth at bedtime.   Yes Historical Provider, MD  Spacer/Aero-Holding Chambers (AEROCHAMBER PLUS WITH MASK) inhaler Use as instructed 05/05/14   Doug SouSam Jacubowitz, MD     Allergies:  Allergies  Allergen Reactions  . Vicodin [Hydrocodone-Acetaminophen] Nausea And Vomiting and Other (See Comments)    Made patient feel funny, stomach pain    Social History:   reports that he has been smoking Cigarettes.  He has been smoking about 1.00 pack per day. He does not have any smokeless tobacco history on file. He reports that he does not drink alcohol or use illicit drugs.  Family History: Family History  Problem Relation Age of Onset  . Heart attack Father   . Colon cancer Father     diagnosed in his 3670s     Physical Exam: Filed Vitals:   04/30/15 0600 04/30/15 0700 04/30/15 0746 04/30/15 0750  BP: 118/79 135/83    Pulse: 71 74    Temp:   97.6 F (36.4 C)   TempSrc:   Oral   Resp: 17 17    Height:      Weight:      SpO2: 92% 92%  94%   Blood pressure 135/83, pulse 74, temperature 97.6 F (36.4 C), temperature source Oral, resp. rate 17, height 5\' 9"  (1.753 m), weight 90.1 kg (198 lb 10.2 oz), SpO2 94 %.  GEN:  Pleasant  patient lying in the stretcher in no acute distress; cooperative with exam. PSYCH:  alert and oriented x4; does not appear anxious or depressed; affect is appropriate. HEENT: Mucous membranes pink and anicteric; PERRLA; EOM  intact; no cervical lymphadenopathy nor thyromegaly or carotid bruit; no JVD; There were no stridor. Neck is very supple. Breasts:: Not examined CHEST WALL: No tenderness CHEST: Normal respiration, wheezing with coughs, but increase air movements.  HEART: Regular rate and rhythm.  There are no murmur, rub, or gallops.   BACK: No kyphosis or scoliosis; no CVA tenderness ABDOMEN: soft and non-tender; no masses, no organomegaly, normal abdominal bowel sounds; no pannus; no intertriginous candida. There is no rebound and no distention. Rectal Exam: Not done EXTREMITIES: No bone or joint deformity; age-appropriate arthropathy of the hands and knees; no edema; no ulcerations.  There is no calf tenderness. Genitalia: not examined PULSES: 2+ and symmetric SKIN: Normal hydration no rash or ulceration CNS: Cranial nerves 2-12 grossly intact no focal lateralizing neurologic deficit.  Speech is fluent; uvula elevated with phonation, facial symmetry and tongue midline. DTR are normal bilaterally, cerebella exam is intact, barbinski is negative and strengths are equaled bilaterally.  No sensory loss.   Labs on Admission:  Basic Metabolic Panel:  Recent Labs Lab 04/25/15 1417 04/26/15 0459 04/27/15 0411 04/28/15 0450  NA 138 138 144 143  K 3.7  4.4 4.0 3.7  CL 105 110 116* 112*  CO2 GLUCOSE 90 164* 146* 157*  BUN CREATININE 1.15 0.79 0.69 0.82  CALCIUM 8.8* 8.0* 8.1* 7.9*   Liver Function Tests:  Recent Labs Lab 04/26/15 0459  AST 26  ALT 17  ALKPHOS 64  BILITOT 0.6  PROT 5.7*  ALBUMIN 3.1*   CBC:  Recent Labs Lab 04/25/15 1417 04/26/15 0459 04/27/15 0411 04/28/15 0450  WBC 11.4* 7.5 10.7* 17.8*  NEUTROABS 9.1*  --   --   --   HGB 14.3 12.9* 12.5* 12.9*  HCT 43.0 39.5 38.7* 40.3  MCV 92.7 92.9 94.2 95.7  PLT 173 151 164 183   Cardiac Enzymes:  Recent Labs Lab 04/25/15 1417  TROPONINI <0.03   Assessment/Plan Present on Admission:  . COPD  exacerbation (HCC) . Acute respiratory failure with hypoxia (HCC) . Tobacco dependence . Community acquired pneumonia  PLAN: Acute Hypoxemic Respiratory Failure Still has significant wheezing. Continue to taper IV Steroid, increase Terbutaline orally to  QID.  Will Transfer him to telemetry today.   CAP -Continue rocephin/azithro. -Cx data remains negative to date.  COPD with Acute Exacerbation -Continue steroids with tapering.  -Add low dose BDZ as he appears to be quite anxious.  Chronic pain syndrome: Will continue IV Morphine PRN. Add Dilaudid orally.   Tobacco Abuse -Counseled on cessation.  Code Status: Full Code Family Communication: None  Disposition Plan: To home when appropriate.    Houston Siren, MD. [facility] Pager [phone] 7pm to 7am.  04/30/2015, 8:40 AM

## 2015-05-01 LAB — COMPREHENSIVE METABOLIC PANEL
ALK PHOS: 84 U/L (ref 38–126)
ALT: 112 U/L — AB (ref 17–63)
AST: 51 U/L — AB (ref 15–41)
Albumin: 2.6 g/dL — ABNORMAL LOW (ref 3.5–5.0)
Anion gap: 6 (ref 5–15)
BUN: 17 mg/dL (ref 6–20)
CHLORIDE: 105 mmol/L (ref 101–111)
CO2: 32 mmol/L (ref 22–32)
CREATININE: 0.67 mg/dL (ref 0.61–1.24)
Calcium: 8.4 mg/dL — ABNORMAL LOW (ref 8.9–10.3)
GFR calc Af Amer: >60 mL/min (ref >60)
GFR calc non Af Amer: >60 mL/min (ref >60)
GLUCOSE: 126 mg/dL — AB (ref 65–99)
Potassium: 4.3 mmol/L (ref 3.5–5.1)
SODIUM: 143 mmol/L (ref 135–145)
Total Bilirubin: 0.7 mg/dL (ref 0.3–1.2)
Total Protein: 5.6 g/dL — ABNORMAL LOW (ref 6.5–8.1)

## 2015-05-01 LAB — CBC
HCT: 38.5 % — ABNORMAL LOW (ref 39.0–52.0)
HEMOGLOBIN: 12.5 g/dL — AB (ref 13.0–17.0)
MCH: 29.9 pg (ref 26.0–34.0)
MCHC: 32.5 g/dL (ref 30.0–36.0)
MCV: 92.1 fL (ref 78.0–100.0)
PLATELETS: 188 10*3/uL (ref 150–400)
RBC: 4.18 MIL/uL — AB (ref 4.22–5.81)
RDW: 13.2 % (ref 11.5–15.5)
WBC: 11 10*3/uL — AB (ref 4.0–10.5)

## 2015-05-01 MED ORDER — GI COCKTAIL ~~LOC~~
30.0000 mL | Freq: Once | ORAL | Status: AC
  Administered 2015-05-01: 30 mL via ORAL
  Filled 2015-05-01: qty 30

## 2015-05-01 MED ORDER — ALUM & MAG HYDROXIDE-SIMETH 200-200-20 MG/5ML PO SUSP
30.0000 mL | Freq: Once | ORAL | Status: AC
  Administered 2015-05-01: 30 mL via ORAL
  Filled 2015-05-01: qty 30

## 2015-05-01 MED ORDER — HYDROCOD POLST-CPM POLST ER 10-8 MG/5ML PO SUER
5.0000 mL | Freq: Two times a day (BID) | ORAL | Status: DC | PRN
Start: 2015-05-01 — End: 2015-05-03
  Administered 2015-05-01 – 2015-05-02 (×2): 5 mL via ORAL
  Filled 2015-05-01 (×3): qty 5

## 2015-05-01 NOTE — Progress Notes (Signed)
[facility] PROGRESS NOTE  [name] StallsLeo Katzman ZOX:[id number] DOB: [birth year]    [facility]    HPI: [name] Carter is an 54 y.o. male with hx of severe COPD, active tobacco abuse, HTN, chronic pain on chronic narcotics, admitted for hypoxic respiratory failure requiring intubation, subsequently extubated, on Rocephin and Zithromax for CAP, complaining of pain and SOB. He is currently on Solumedrol at 80 IV Q 6 hours, and has significant wheezing still, though better. He is followed by Dr Juanetta GoslingHawkins as well. Patient is doing a little better today. Oral Terbutaline was started and now on 5mg  QID.  He is getting slowly better.   Rewiew of Systems:  Constitutional: Negative for malaise, fever and chills. No significant weight loss or weight gain Eyes: Negative for eye pain, redness and discharge, diplopia, visual changes, or flashes of light. ENMT: Negative for ear pain, hoarseness, nasal congestion, sinus pressure and sore throat. No headaches; tinnitus, drooling, or problem swallowing. Cardiovascular: Negative for chest pain, palpitations, diaphoresis,  and peripheral edema. ; No orthopnea, PND Respiratory: Negative for cough, hemoptysis, wheezing and stridor. No pleuritic chestpain. Gastrointestinal: Negative for nausea, vomiting, diarrhea, constipation, abdominal pain, melena, blood in stool, hematemesis, jaundice and rectal bleeding.    Genitourinary: Negative for frequency, dysuria, incontinence,flank pain and hematuria; Musculoskeletal: Negative for back pain and neck pain. Negative for swelling and trauma.;  Skin: . Negative for pruritus, rash, abrasions, bruising and skin lesion.; ulcerations Neuro: Negative for headache, lightheadedness and neck stiffness. Negative for weakness, altered level of consciousness , altered mental status, extremity weakness, burning feet, involuntary movement, seizure and syncope.  Psych: negative for anxiety, depression, insomnia,  tearfulness, panic attacks, hallucinations, paranoia, suicidal or homicidal ideation   Past Medical History  Diagnosis Date  . COPD (chronic obstructive pulmonary disease) (HCC)   . Pneumonia   . Chronic back pain   . DDD (degenerative disc disease), lumbar   . DDD (degenerative disc disease), cervical   . Neck pain   . HTN (hypertension)     Past Surgical History  Procedure Laterality Date  . Back surgery      low back x4  . Bilateral carpal tunnel release    . Ulnar nerve transposition      ? by description, not clear  . Bilteral knee surgery    . Esophagogastroduodenoscopy  07/2012    [place] MD: MAC. Chronic gastritis and intestinal metaplasia, ?metaplastic atrophic gastritis, negative H.pylori, no celiac, duidnal mucosa with mild Brunner gland hyperplasia, nonspecific.   . Colonoscopy  07/2012    [place] MD: MAC. Random colon biopsies benign. Rectal polyp tubular adenoma.    Medications:  HOME MEDS: Prior to Admission medications   Medication Sig Start Date End Date Taking? Authorizing Provider  albuterol (PROVENTIL HFA;VENTOLIN HFA) 108 (90 BASE) MCG/ACT inhaler Inhale 2 puffs into the lungs every 4 (four) hours as needed for wheezing or shortness of breath. 08/21/14  Yes Standley Brookinganiel P Goodrich, MD  albuterol (PROVENTIL) (2.5 MG/3ML) 0.083% nebulizer solution Take 3 mLs (2.5 mg total) by nebulization every 4 (four) hours as needed for wheezing or shortness of breath. 05/16/14  Yes Standley Brookinganiel P Goodrich, MD  cyclobenzaprine (FLEXERIL) 10 MG tablet Take 10 mg by mouth at bedtime.   Yes Historical Provider, MD  DULoxetine (CYMBALTA) 30 MG capsule Take 30 mg by mouth 2 (two) times daily.   Yes Historical Provider, MD  Ipratropium-Albuterol (COMBIVENT) 20-100 MCG/ACT AERS respimat Inhale 1 puff into the lungs 4 (four) times  daily. 08/21/14  Yes Standley Brooking, MD  Linaclotide [facility]) 145 MCG CAPS capsule Take 1 capsule (145 mcg total) by mouth daily before breakfast. For  constipation 02/12/15  Yes Tiffany Kocher, PA-C  oxyCODONE-acetaminophen (PERCOCET) 10-325 MG per tablet Take 1 tablet by mouth every 6 (six) hours as needed (severe pain). 08/21/14  Yes Standley Brooking, MD  pantoprazole (PROTONIX) 40 MG tablet Take 1 tablet (40 mg total) by mouth daily before breakfast. 02/12/15  Yes Tiffany Kocher, PA-C  promethazine (PHENERGAN) 12.5 MG tablet 1 po q6h prn nausea Patient taking differently: Take 12.5 mg by mouth every 6 (six) hours as needed for nausea. 1 po q6h prn nausea 06/22/14  Yes Ivery Quale, PA-C  traZODone (DESYREL) 100 MG tablet Take 100 mg by mouth at bedtime.   Yes Historical Provider, MD  Spacer/Aero-Holding Chambers (AEROCHAMBER PLUS WITH MASK) inhaler Use as instructed 05/05/14   Doug Sou, MD     Allergies:  Allergies  Allergen Reactions  . Vicodin [Hydrocodone-Acetaminophen] Nausea And Vomiting and Other (See Comments)    Made patient feel funny, stomach pain    Social History:   reports that he has been smoking Cigarettes.  He has been smoking about 1.00 pack per day. He does not have any smokeless tobacco history on file. He reports that he does not drink alcohol or use illicit drugs.  Family History: Family History  Problem Relation Age of Onset  . Heart attack Father   . Colon cancer Father     diagnosed in his 3s     Physical Exam: Filed Vitals:   05/01/15 0014 05/01/15 0530 05/01/15 0829 05/01/15 1130  BP:  132/86    Pulse:  72    Temp:  97.5 F (36.4 C)    TempSrc:  Oral    Resp:  18    Height:      Weight:      SpO2: 96% 95% 94% 92%   Blood pressure 132/86, pulse 72, temperature 97.5 F (36.4 C), temperature source Oral, resp. rate 18, height  (1.753 m), weight 90.1 kg (198 lb 10.2 oz), SpO2 92 %.  GEN:  Pleasant  patient lying in the stretcher in no acute distress; cooperative with exam. PSYCH:  alert and oriented x4; does not appear anxious or depressed; affect is appropriate. HEENT: Mucous  membranes pink and anicteric; PERRLA; EOM intact; no cervical lymphadenopathy nor thyromegaly or carotid bruit; no JVD; There were no stridor. Neck is very supple. Breasts:: Not examined CHEST WALL: No tenderness CHEST: Normal respiration, still with expiratory wheezes.  HEART: Regular rate and rhythm.  There are no murmur, rub, or gallops.   BACK: No kyphosis or scoliosis; no CVA tenderness ABDOMEN: soft and non-tender; no masses, no organomegaly, normal abdominal bowel sounds; no pannus; no intertriginous candida. There is no rebound and no distention. Rectal Exam: Not done EXTREMITIES: No bone or joint deformity; age-appropriate arthropathy of the hands and knees; no edema; no ulcerations.  There is no calf tenderness. Genitalia: not examined PULSES: 2+ and symmetric SKIN: Normal hydration no rash or ulceration CNS: Cranial nerves 2-12 grossly intact no focal lateralizing neurologic deficit.  Speech is fluent; uvula elevated with phonation, facial symmetry and tongue midline. DTR are normal bilaterally, cerebella exam is intact, barbinski is negative and strengths are equaled bilaterally.  No sensory loss.  Basic Metabolic Panel:  Recent Labs Lab 04/25/15 1417 04/26/15 0459 04/27/15 0411 04/28/15 0450 05/01/15 0602  NA 138 138 144 143  143  K 3.7 4.4 4.0 3.7 4.3  CL 105 110 116* 112* 105  CO2 32  GLUCOSE 90 164* 146* 157* 126*  BUN CREATININE 1.15 0.79 0.69 0.82 0.67  CALCIUM 8.8* 8.0* 8.1* 7.9* 8.4*   Liver Function Tests:  Recent Labs Lab 04/26/15 0459 05/01/15 0602  AST 26 51*  ALT 17 112*  ALKPHOS 64 84  BILITOT 0.6 0.7  PROT 5.7* 5.6*  ALBUMIN 3.1* 2.6*   CBC:  Recent Labs Lab 04/25/15 1417 04/26/15 0459 04/27/15 0411 04/28/15 0450 05/01/15 0602  WBC 11.4* 7.5 10.7* 17.8* 11.0*  NEUTROABS 9.1*  --   --   --   --   HGB 14.3 12.9* 12.5* 12.9* 12.5*  HCT 43.0 39.5 38.7* 40.3 38.5*  MCV 92.7 92.9 94.2 95.7 92.1  PLT 173 151 164  183 188   Cardiac Enzymes:  Recent Labs Lab 04/25/15 1417  TROPONINI <0.03    Assessment/Plan Present on Admission:  . COPD exacerbation (HCC) . Acute respiratory failure with hypoxia (HCC) . Tobacco dependence . Community acquired pneumonia  PLAN:  Will continue with current Tx for COPD exacerbation.  Will continue with steroids, antibiotics, and neb.  Will give a little cough suppressant.  I will obtain labs to be sure he does n't have combined variable immunoglobin deficiency  (CVID).   Will continue current pain regimen.   No more cigarettes,  Please.   Other plans as per orders.  Code Status: Boykin Peek, MD. [facility] Pager [phone] 7pm to 7am.  05/01/2015, 12:37 PM

## 2015-05-01 NOTE — Progress Notes (Signed)
Subjective: He says he feels better. He rates himself at about a 6 out of 10 with 10 being the best he gets. He is still having cough congestion and wheezing.  Objective: Vital signs in last 24 hours: Temp:  [97.5 F (36.4 C)-98.8 F (37.1 C)] 97.5 F (36.4 C) (10/29 0530) Pulse Rate:  [72-85] 72 (10/29 0530) Resp:  [14-18] 18 (10/29 0530) BP: (130-144)/(77-86) 132/86 mmHg (10/29 0530) SpO2:  [88 %-96 %] 94 % (10/29 0829) Weight change:  Last BM Date: 04/30/15  Intake/Output from previous day: 10/28 0701 - 10/29 0700 In: 1320 [P.O.:1320] Out: 1950 [Urine:1950]  PHYSICAL EXAM General appearance: alert, cooperative and mild distress Resp: rhonchi bilaterally and wheezes bilaterally Cardio: regular rate and rhythm, S1, S2 normal, no murmur, click, rub or gallop GI: soft, non-tender; bowel sounds normal; no masses,  no organomegaly Extremities: extremities normal, atraumatic, no cyanosis or edema  Lab Results:  Results for orders placed or performed during the hospital encounter of 04/25/15 (from the past 48 hour(s))  CBC     Status: Abnormal   Collection Time: 05/01/15  6:02 AM  Result Value Ref Range   WBC 11.0 (H) 4.0 - 10.5 K/uL   RBC 4.18 (L) 4.22 - 5.81 MIL/uL   Hemoglobin 12.5 (L) 13.0 - 17.0 g/dL   HCT 38.5 (L) 39.0 - 52.0 %   MCV 92.1 78.0 - 100.0 fL   MCH 29.9 26.0 - 34.0 pg   MCHC 32.5 30.0 - 36.0 g/dL   RDW 13.2 11.5 - 15.5 %   Platelets 188 150 - 400 K/uL  Comprehensive metabolic panel     Status: Abnormal   Collection Time: 05/01/15  6:02 AM  Result Value Ref Range   Sodium 143 135 - 145 mmol/L   Potassium 4.3 3.5 - 5.1 mmol/L   Chloride 105 101 - 111 mmol/L   CO2 32 22 - 32 mmol/L   Glucose, Bld 126 (H) 65 - 99 mg/dL   BUN 17 6 - 20 mg/dL   Creatinine, Ser 0.67 0.61 - 1.24 mg/dL   Calcium 8.4 (L) 8.9 - 10.3 mg/dL   Total Protein 5.6 (L) 6.5 - 8.1 g/dL   Albumin 2.6 (L) 3.5 - 5.0 g/dL   AST 51 (H) 15 - 41 U/L   ALT 112 (H) 17 - 63 U/L   Alkaline  Phosphatase 84 38 - 126 U/L   Total Bilirubin 0.7 0.3 - 1.2 mg/dL   GFR calc non Af Amer >60 >60 mL/min   GFR calc Af Amer >60 >60 mL/min    Comment: (NOTE) The eGFR has been calculated using the CKD EPI equation. This calculation has not been validated in all clinical situations. eGFR's persistently <60 mL/min signify possible Chronic Kidney Disease.    Anion gap 6 5 - 15    ABGS No results for input(s): PHART, PO2ART, TCO2, HCO3 in the last 72 hours.  Invalid input(s): PCO2 CULTURES Recent Results (from the past 240 hour(s))  Blood Culture (routine x 2)     Status: None   Collection Time: 04/25/15  2:51 PM  Result Value Ref Range Status   Specimen Description BLOOD RIGHT HAND DRAWN BY RN  Final   Special Requests BOTTLES DRAWN AEROBIC AND ANAEROBIC [place]  Final   Culture NO GROWTH 5 DAYS  Final   Report Status 04/30/2015 FINAL  Final  Urine culture     Status: None   Collection Time: 04/25/15  2:51 PM  Result Value Ref Range  Status   Specimen Description URINE, CATHETERIZED  Final   Special Requests NONE  Final   Culture   Final    NO GROWTH 2 DAYS Performed at [facility]    Report Status 04/27/2015 FINAL  Final  Blood Culture (routine x 2)     Status: None   Collection Time: 04/25/15  2:59 PM  Result Value Ref Range Status   Specimen Description BLOOD LEFT HAND  Final   Special Requests BOTTLES DRAWN AEROBIC AND ANAEROBIC [place]  Final   Culture NO GROWTH 5 DAYS  Final   Report Status 04/30/2015 FINAL  Final  MRSA PCR Screening     Status: None   Collection Time: 04/25/15  5:35 PM  Result Value Ref Range Status   MRSA by PCR NEGATIVE NEGATIVE Final    Comment:        The GeneXpert MRSA Assay (FDA approved for NASAL specimens only), is one component of a comprehensive MRSA colonization surveillance program. It is not intended to diagnose MRSA infection nor to guide or monitor treatment for MRSA infections.   Culture, respiratory  (NON-Expectorated)     Status: None   Collection Time: 04/25/15 11:45 PM  Result Value Ref Range Status   Specimen Description SPUTUM  Final   Special Requests NONE  Final   Gram Stain   Final    FEW WBC PRESENT,BOTH PMN AND MONONUCLEAR RARE SQUAMOUS EPITHELIAL CELLS PRESENT NO ORGANISMS SEEN Performed at [facility]    Culture   Final    NORMAL OROPHARYNGEAL FLORA Performed at [facility]    Report Status 04/28/2015 FINAL  Final   Studies/Results: No results found.  Medications:  Prior to Admission:  Prescriptions prior to admission  Medication Sig Dispense Refill Last Dose  . albuterol (PROVENTIL HFA;VENTOLIN HFA) 108 (90 BASE) MCG/ACT inhaler Inhale 2 puffs into the lungs every 4 (four) hours as needed for wheezing or shortness of breath. 1 Inhaler 1 04/25/2015 at Unknown time  . albuterol (PROVENTIL) (2.5 MG/3ML) 0.083% nebulizer solution Take 3 mLs (2.5 mg total) by nebulization every 4 (four) hours as needed for wheezing or shortness of breath.   04/25/2015 at Unknown time  . cyclobenzaprine (FLEXERIL) 10 MG tablet Take 10 mg by mouth at bedtime.   04/24/2015 at Unknown time  . DULoxetine (CYMBALTA) 30 MG capsule Take 30 mg by mouth 2 (two) times daily.   04/25/2015 at Unknown time  . Ipratropium-Albuterol (COMBIVENT) 20-100 MCG/ACT AERS respimat Inhale 1 puff into the lungs 4 (four) times daily. 1 Inhaler 1 04/25/2015 at Unknown time  . Linaclotide (LINZESS) 145 MCG CAPS capsule Take 1 capsule (145 mcg total) by mouth daily before breakfast. For constipation 30 capsule 5 04/24/2015 at Unknown time  . oxyCODONE-acetaminophen (PERCOCET) 10-325 MG per tablet Take 1 tablet by mouth every 6 (six) hours as needed (severe pain). 20 tablet 0 Past Week at Unknown time  . pantoprazole (PROTONIX) 40 MG tablet Take 1 tablet (40 mg total) by mouth daily before breakfast. 30 tablet 5 04/24/2015 at Unknown time  . promethazine (PHENERGAN) 12.5 MG tablet 1 po q6h prn nausea  (Patient taking differently: Take 12.5 mg by mouth every 6 (six) hours as needed for nausea. 1 po q6h prn nausea) 12 tablet 0 unknown  . traZODone (DESYREL) 100 MG tablet Take 100 mg by mouth at bedtime.   04/24/2015 at Unknown time  . Spacer/Aero-Holding Chambers (AEROCHAMBER PLUS WITH MASK) inhaler Use as instructed 1 each 2  Taking   Scheduled: . docusate sodium  100 mg Oral BID  . enoxaparin (LOVENOX) injection  40 mg Subcutaneous Q24H  . feeding supplement  1 Container Oral TID BM  . guaiFENesin  1,200 mg Oral BID  . ipratropium-albuterol  3 mL Nebulization Q4H  . levofloxacin  750 mg Oral Daily  . Linaclotide  145 mcg Oral Daily  . methylPREDNISolone (SOLU-MEDROL) injection  40 mg Intravenous Q6H  . pantoprazole  40 mg Oral Q0600  . polyethylene glycol  17 g Oral Daily  . terbutaline  5 mg Oral 4 times per day   Continuous: . albuterol 7.5 mg/hr (04/28/15 1141)   BTD:HRCBULAGT, ALPRAZolam, bisacodyl, HYDROmorphone, morphine injection  Assesment: He was admitted with acute hypoxic respiratory failure requiring ventilator support. This is because of severe COPD with COPD exacerbation and community-acquired pneumonia. He is slowly improving Active Problems:   Tobacco dependence   Acute respiratory failure with hypoxia (HCC)   COPD exacerbation ([place])   Community acquired pneumonia    Plan: Continue current treatments    LOS: 6 days   Anzel Kearse L 05/01/2015, 10:43 AM

## 2015-05-02 LAB — CREATININE, SERUM
Creatinine, Ser: 0.53 mg/dL — ABNORMAL LOW (ref 0.61–1.24)
GFR calc Af Amer: >60 mL/min (ref >60)
GFR calc non Af Amer: >60 mL/min (ref >60)

## 2015-05-02 LAB — IGG, IGA, IGM
IGG (IMMUNOGLOBIN G), SERUM: 596 mg/dL — AB (ref 700–1600)
IGM, SERUM: 81 mg/dL (ref 20–172)
IgA: 159 mg/dL (ref 90–386)

## 2015-05-02 MED ORDER — PREDNISONE 20 MG PO TABS
40.0000 mg | ORAL_TABLET | Freq: Every day | ORAL | Status: DC
  Administered 2015-05-02 – 2015-05-03 (×2): 40 mg via ORAL
  Filled 2015-05-02 (×2): qty 2

## 2015-05-02 NOTE — Progress Notes (Signed)
Subjective: He says he feels better. He is wheezing significantly less. He has no new complaints. He is coughing up some sputum now.  Objective: Vital signs in last 24 hours: Temp:  [97.9 F (36.6 C)-98.4 F (36.9 C)] 98.1 F (36.7 C) (10/30 0508) Pulse Rate:  [73-81] 73 (10/30 0508) Resp:  [18] 18 (10/30 0508) BP: (117-146)/(74-83) 117/75 mmHg (10/30 0508) SpO2:  [91 %-96 %] 94 % (10/30 0829) Weight change:  Last BM Date: 05/01/15  Intake/Output from previous day: 10/29 0701 - 10/30 0700 In: 960 [P.O.:960] Out: 2076 [Urine:2075; Stool:1]  PHYSICAL EXAM General appearance: alert, cooperative and no distress Resp: He has much less wheezing and is moving air better Cardio: regular rate and rhythm, S1, S2 normal, no murmur, click, rub or gallop GI: soft, non-tender; bowel sounds normal; no masses,  no organomegaly Extremities: extremities normal, atraumatic, no cyanosis or edema  Lab Results:  Results for orders placed or performed during the hospital encounter of 04/25/15 (from the past 48 hour(s))  CBC     Status: Abnormal   Collection Time: 05/01/15  6:02 AM  Result Value Ref Range   WBC 11.0 (H) 4.0 - 10.5 K/uL   RBC 4.18 (L) 4.22 - 5.81 MIL/uL   Hemoglobin 12.5 (L) 13.0 - 17.0 g/dL   HCT 38.5 (L) 39.0 - 52.0 %   MCV 92.1 78.0 - 100.0 fL   MCH 29.9 26.0 - 34.0 pg   MCHC 32.5 30.0 - 36.0 g/dL   RDW 13.2 11.5 - 15.5 %   Platelets 188 150 - 400 K/uL  Comprehensive metabolic panel     Status: Abnormal   Collection Time: 05/01/15  6:02 AM  Result Value Ref Range   Sodium 143 135 - 145 mmol/L   Potassium 4.3 3.5 - 5.1 mmol/L   Chloride 105 101 - 111 mmol/L   CO2 32 22 - 32 mmol/L   Glucose, Bld 126 (H) 65 - 99 mg/dL   BUN 17 6 - 20 mg/dL   Creatinine, Ser 0.67 0.61 - 1.24 mg/dL   Calcium 8.4 (L) 8.9 - 10.3 mg/dL   Total Protein 5.6 (L) 6.5 - 8.1 g/dL   Albumin 2.6 (L) 3.5 - 5.0 g/dL   AST 51 (H) 15 - 41 U/L   ALT 112 (H) 17 - 63 U/L   Alkaline Phosphatase 84 38 -  126 U/L   Total Bilirubin 0.7 0.3 - 1.2 mg/dL   GFR calc non Af Amer >60 >60 mL/min   GFR calc Af Amer >60 >60 mL/min    Comment: (NOTE) The eGFR has been calculated using the CKD EPI equation. This calculation has not been validated in all clinical situations. eGFR's persistently <60 mL/min signify possible Chronic Kidney Disease.    Anion gap 6 5 - 15  Creatinine, serum     Status: Abnormal   Collection Time: 05/02/15  5:55 AM  Result Value Ref Range   Creatinine, Ser 0.53 (L) 0.61 - 1.24 mg/dL   GFR calc non Af Amer >60 >60 mL/min   GFR calc Af Amer >60 >60 mL/min    Comment: (NOTE) The eGFR has been calculated using the CKD EPI equation. This calculation has not been validated in all clinical situations. eGFR's persistently <60 mL/min signify possible Chronic Kidney Disease.     ABGS No results for input(s): PHART, PO2ART, TCO2, HCO3 in the last 72 hours.  Invalid input(s): PCO2 CULTURES Recent Results (from the past 240 hour(s))  Blood Culture (routine x 2)  Status: None   Collection Time: 04/25/15  2:51 PM  Result Value Ref Range Status   Specimen Description BLOOD RIGHT HAND DRAWN BY RN  Final   Special Requests BOTTLES DRAWN AEROBIC AND ANAEROBIC [place]  Final   Culture NO GROWTH 5 DAYS  Final   Report Status 04/30/2015 FINAL  Final  Urine culture     Status: None   Collection Time: 04/25/15  2:51 PM  Result Value Ref Range Status   Specimen Description URINE, CATHETERIZED  Final   Special Requests NONE  Final   Culture   Final    NO GROWTH 2 DAYS Performed at [facility]    Report Status 04/27/2015 FINAL  Final  Blood Culture (routine x 2)     Status: None   Collection Time: 04/25/15  2:59 PM  Result Value Ref Range Status   Specimen Description BLOOD LEFT HAND  Final   Special Requests BOTTLES DRAWN AEROBIC AND ANAEROBIC [place]  Final   Culture NO GROWTH 5 DAYS  Final   Report Status 04/30/2015 FINAL  Final  MRSA PCR Screening      Status: None   Collection Time: 04/25/15  5:35 PM  Result Value Ref Range Status   MRSA by PCR NEGATIVE NEGATIVE Final    Comment:        The GeneXpert MRSA Assay (FDA approved for NASAL specimens only), is one component of a comprehensive MRSA colonization surveillance program. It is not intended to diagnose MRSA infection nor to guide or monitor treatment for MRSA infections.   Culture, respiratory (NON-Expectorated)     Status: None   Collection Time: 04/25/15 11:45 PM  Result Value Ref Range Status   Specimen Description SPUTUM  Final   Special Requests NONE  Final   Gram Stain   Final    FEW WBC PRESENT,BOTH PMN AND MONONUCLEAR RARE SQUAMOUS EPITHELIAL CELLS PRESENT NO ORGANISMS SEEN Performed at [facility]    Culture   Final    NORMAL OROPHARYNGEAL FLORA Performed at [facility]    Report Status 04/28/2015 FINAL  Final   Studies/Results: No results found.  Medications:  Prior to Admission:  Prescriptions prior to admission  Medication Sig Dispense Refill Last Dose  . albuterol (PROVENTIL HFA;VENTOLIN HFA) 108 (90 BASE) MCG/ACT inhaler Inhale 2 puffs into the lungs every 4 (four) hours as needed for wheezing or shortness of breath. 1 Inhaler 1 04/25/2015 at Unknown time  . albuterol (PROVENTIL) (2.5 MG/3ML) 0.083% nebulizer solution Take 3 mLs (2.5 mg total) by nebulization every 4 (four) hours as needed for wheezing or shortness of breath.   04/25/2015 at Unknown time  . cyclobenzaprine (FLEXERIL) 10 MG tablet Take 10 mg by mouth at bedtime.   04/24/2015 at Unknown time  . DULoxetine (CYMBALTA) 30 MG capsule Take 30 mg by mouth 2 (two) times daily.   04/25/2015 at Unknown time  . Ipratropium-Albuterol (COMBIVENT) 20-100 MCG/ACT AERS respimat Inhale 1 puff into the lungs 4 (four) times daily. 1 Inhaler 1 04/25/2015 at Unknown time  . Linaclotide (LINZESS) 145 MCG CAPS capsule Take 1 capsule (145 mcg total) by mouth daily before breakfast. For  constipation 30 capsule 5 04/24/2015 at Unknown time  . oxyCODONE-acetaminophen (PERCOCET) 10-325 MG per tablet Take 1 tablet by mouth every 6 (six) hours as needed (severe pain). 20 tablet 0 Past Week at Unknown time  . pantoprazole (PROTONIX) 40 MG tablet Take 1 tablet (40 mg total) by mouth daily before  breakfast. 30 tablet 5 04/24/2015 at Unknown time  . promethazine (PHENERGAN) 12.5 MG tablet 1 po q6h prn nausea (Patient taking differently: Take 12.5 mg by mouth every 6 (six) hours as needed for nausea. 1 po q6h prn nausea) 12 tablet 0 unknown  . traZODone (DESYREL) 100 MG tablet Take 100 mg by mouth at bedtime.   04/24/2015 at Unknown time  . Spacer/Aero-Holding Chambers (AEROCHAMBER PLUS WITH MASK) inhaler Use as instructed 1 each 2 Taking   Scheduled: . docusate sodium  100 mg Oral BID  . enoxaparin (LOVENOX) injection  40 mg Subcutaneous Q24H  . feeding supplement  1 Container Oral TID BM  . guaiFENesin  1,200 mg Oral BID  . ipratropium-albuterol  3 mL Nebulization Q4H  . levofloxacin  750 mg Oral Daily  . Linaclotide  145 mcg Oral Daily  . pantoprazole  40 mg Oral Q0600  . polyethylene glycol  17 g Oral Daily  . [START ON 05/03/2015] predniSONE  40 mg Oral QAC breakfast  . terbutaline  5 mg Oral 4 times per day   Continuous: . albuterol 7.5 mg/hr (04/28/15 1141)   CWC:BJSEGBTDV, ALPRAZolam, bisacodyl, chlorpheniramine-HYDROcodone, HYDROmorphone, morphine injection  Assesment: He was admitted with acute ventilator-dependent respiratory failure. He has community-acquired pneumonia and COPD exacerbation. At baseline he has severe COPD. Active Problems:   Tobacco dependence   Acute respiratory failure with hypoxia (HCC)   COPD exacerbation ([place])   Community acquired pneumonia    Plan: Continue treatments. He does seem to be getting better daily although it is slow    LOS: 7 days   HAWKINS,EDWARD L 05/02/2015, 10:52 AM

## 2015-05-02 NOTE — Progress Notes (Signed)
[facility] PROGRESS NOTE  [name] Carter OZH:[id number] DOB: 1960-07-19    [facility]   HPI: [name] Carter is an 54 y.o. male with hx of severe COPD, active tobacco abuse, HTN, chronic pain on chronic narcotics, admitted for hypoxic respiratory failure requiring intubation, subsequently extubated, on Rocephin and Zithromax for CAP, complaining of pain and SOB. He is currently on Solumedrol at 60 IV Q 6 hours, and has significant wheezing still, though better. He is followed by Dr Juanetta GoslingHawkins as well. Patient is doing a little better today. Oral Terbutaline was started and now on 5mg  QID. He is getting slowly better.    Rewiew of Systems:  Constitutional: Negative for malaise, fever and chills. No significant weight loss or weight gain Eyes: Negative for eye pain, redness and discharge, diplopia, visual changes, or flashes of light. ENMT: Negative for ear pain, hoarseness, nasal congestion, sinus pressure and sore throat. No headaches; tinnitus, drooling, or problem swallowing. Cardiovascular: Negative for chest pain, palpitations, diaphoresis, dyspnea and peripheral edema. ; No orthopnea, PND Respiratory: Negative for cough, hemoptysis, wheezing and stridor. No pleuritic chestpain. Gastrointestinal: Negative for nausea, vomiting, diarrhea, constipation, abdominal pain, melena, blood in stool, hematemesis, jaundice and rectal bleeding.    Genitourinary: Negative for frequency, dysuria, incontinence,flank pain and hematuria; Musculoskeletal: Negative for back pain and neck pain. Negative for swelling and trauma.;  Skin: . Negative for pruritus, rash, abrasions, bruising and skin lesion.; ulcerations Neuro: Negative for headache, lightheadedness and neck stiffness. Negative for weakness, altered level of consciousness , altered mental status, extremity weakness, burning feet, involuntary movement, seizure and syncope.  Psych: negative for anxiety, depression,  insomnia, tearfulness, panic attacks, hallucinations, paranoia, suicidal or homicidal ideation    Past Medical History  Diagnosis Date  . COPD (chronic obstructive pulmonary disease) (HCC)   . Pneumonia   . Chronic back pain   . DDD (degenerative disc disease), lumbar   . DDD (degenerative disc disease), cervical   . Neck pain   . HTN (hypertension)     Past Surgical History  Procedure Laterality Date  . Back surgery      low back x4  . Bilateral carpal tunnel release    . Ulnar nerve transposition      ? by description, not clear  . Bilteral knee surgery    . Esophagogastroduodenoscopy  07/2012    [place] MD: MAC. Chronic gastritis and intestinal metaplasia, ?metaplastic atrophic gastritis, negative H.pylori, no celiac, duidnal mucosa with mild Brunner gland hyperplasia, nonspecific.   . Colonoscopy  07/2012    [place] MD: MAC. Random colon biopsies benign. Rectal polyp tubular adenoma.    Medications:  HOME MEDS: Prior to Admission medications   Medication Sig Start Date End Date Taking? Authorizing Provider  albuterol (PROVENTIL HFA;VENTOLIN HFA) 108 (90 BASE) MCG/ACT inhaler Inhale 2 puffs into the lungs every 4 (four) hours as needed for wheezing or shortness of breath. 08/21/14  Yes Standley Brookinganiel P Goodrich, MD  albuterol (PROVENTIL) (2.5 MG/3ML) 0.083% nebulizer solution Take 3 mLs (2.5 mg total) by nebulization every 4 (four) hours as needed for wheezing or shortness of breath. 05/16/14  Yes Standley Brookinganiel P Goodrich, MD  cyclobenzaprine (FLEXERIL) 10 MG tablet Take 10 mg by mouth at bedtime.   Yes Historical Provider, MD  DULoxetine (CYMBALTA) 30 MG capsule Take 30 mg by mouth 2 (two) times daily.   Yes Historical Provider, MD  Ipratropium-Albuterol (COMBIVENT) 20-100 MCG/ACT AERS respimat Inhale 1 puff into the lungs 4 (four) times  daily. 08/21/14  Yes Standley Brooking, MD  Linaclotide [facility]) 145 MCG CAPS capsule Take 1 capsule (145 mcg total) by mouth daily before breakfast. For  constipation 02/12/15  Yes Tiffany Kocher, PA-C  oxyCODONE-acetaminophen (PERCOCET) 10-325 MG per tablet Take 1 tablet by mouth every 6 (six) hours as needed (severe pain). 08/21/14  Yes Standley Brooking, MD  pantoprazole (PROTONIX) 40 MG tablet Take 1 tablet (40 mg total) by mouth daily before breakfast. 02/12/15  Yes Tiffany Kocher, PA-C  promethazine (PHENERGAN) 12.5 MG tablet 1 po q6h prn nausea Patient taking differently: Take 12.5 mg by mouth every 6 (six) hours as needed for nausea. 1 po q6h prn nausea 06/22/14  Yes Ivery Quale, PA-C  traZODone (DESYREL) 100 MG tablet Take 100 mg by mouth at bedtime.   Yes Historical Provider, MD  Spacer/Aero-Holding Chambers (AEROCHAMBER PLUS WITH MASK) inhaler Use as instructed 05/05/14   Doug Sou, MD     Allergies:  Allergies  Allergen Reactions  . Vicodin [Hydrocodone-Acetaminophen] Nausea And Vomiting and Other (See Comments)    Made patient feel funny, stomach pain    Social History:   reports that he has been smoking Cigarettes.  He has been smoking about 1.00 pack per day. He does not have any smokeless tobacco history on file. He reports that he does not drink alcohol or use illicit drugs.  Family History: Family History  Problem Relation Age of Onset  . Heart attack Father   . Colon cancer Father     diagnosed in his 78s     Physical Exam: Filed Vitals:   05/01/15 2049 05/01/15 2331 05/02/15 0508 05/02/15 0829  BP: 146/83  117/75   Pulse: 73  73   Temp: 98.4 F (36.9 C)  98.1 F (36.7 C)   TempSrc: Oral  Oral   Resp: 18  18   Height:      Weight:      SpO2: 93% 95% 96% 94%   Blood pressure 117/75, pulse 73, temperature 98.1 F (36.7 C), temperature source Oral, resp. rate 18, height  (1.753 m), weight 90.1 kg (198 lb 10.2 oz), SpO2 94 %.  GEN:  Pleasant  patient lying in the stretcher in no acute distress; cooperative with exam. PSYCH:  alert and oriented x4; does not appear anxious or depressed; affect is  appropriate. HEENT: Mucous membranes pink and anicteric; PERRLA; EOM intact; no cervical lymphadenopathy nor thyromegaly or carotid bruit; no JVD; There were no stridor. Neck is very supple. Breasts:: Not examined CHEST WALL: No tenderness CHEST: Normal respiration, wheezing is now minimal.  HEART: Regular rate and rhythm.  There are no murmur, rub, or gallops.   BACK: No kyphosis or scoliosis; no CVA tenderness ABDOMEN: soft and non-tender; no masses, no organomegaly, normal abdominal bowel sounds; no pannus; no intertriginous candida. There is no rebound and no distention. Rectal Exam: Not done EXTREMITIES: No bone or joint deformity; age-appropriate arthropathy of the hands and knees; no edema; no ulcerations.  There is no calf tenderness. Genitalia: not examined PULSES: 2+ and symmetric SKIN: Normal hydration no rash or ulceration CNS: Cranial nerves 2-12 grossly intact no focal lateralizing neurologic deficit.  Speech is fluent; uvula elevated with phonation, facial symmetry and tongue midline. DTR are normal bilaterally, cerebella exam is intact, barbinski is negative and strengths are equaled bilaterally.  No sensory loss.   Labs on Admission:  Basic Metabolic Panel:  Recent Labs Lab 04/25/15 1417 04/26/15 0459 04/27/15 0411 04/28/15 6644  05/01/15 0602 05/02/15 0555  NA 138 138 144 143 143  --   K 3.7 4.4 4.0 3.7 4.3  --   CL 105 110 116* 112* 105  --   CO2 32  --   GLUCOSE 90 164* 146* 157* 126*  --   BUN --   CREATININE 1.15 0.79 0.69 0.82 0.67 0.53*  CALCIUM 8.8* 8.0* 8.1* 7.9* 8.4*  --    Liver Function Tests:  Recent Labs Lab 04/26/15 0459 05/01/15 0602  AST 26 51*  ALT 17 112*  ALKPHOS 64 84  BILITOT 0.6 0.7  PROT 5.7* 5.6*  ALBUMIN 3.1* 2.6*    Recent Labs Lab 04/25/15 1417 04/26/15 0459 04/27/15 0411 04/28/15 0450 05/01/15 0602  WBC 11.4* 7.5 10.7* 17.8* 11.0*  NEUTROABS 9.1*  --   --   --   --   HGB 14.3 12.9* 12.5*  12.9* 12.5*  HCT 43.0 39.5 38.7* 40.3 38.5*  MCV 92.7 92.9 94.2 95.7 92.1  PLT 173 151 164 183 188   Cardiac Enzymes:  Recent Labs Lab 04/25/15 1417  TROPONINI <0.03   Assessment/Plan Present on Admission:  . COPD exacerbation (HCC) . Acute respiratory failure with hypoxia (HCC) . Tobacco dependence . Community acquired pneumonia  PLAN:  PLAN: Will continue with current Tx for COPD exacerbation. Will continue with steroids, change to Prednisone today, continue antibiotics, and neb. Cough suppressant has helped. Labs to be sure he does n't have combined variable immunoglobin deficiency (CVID) is still pending.  Will continue current pain regimen. No more cigarettes, Please.   He will need to follow up with pain clinic for his narcotic needs.   Other plans as per orders.  Code Status: FULL Unk Lightning, MD. [facility] Pager [phone] 7pm to 7am.  05/02/2015, 9:50 AM

## 2015-05-03 ENCOUNTER — Encounter (HOSPITAL_COMMUNITY): Payer: Self-pay | Admitting: Internal Medicine

## 2015-05-03 MED ORDER — TERBUTALINE SULFATE 5 MG PO TABS: 5.0000 mg | ORAL_TABLET | Freq: Four times a day (QID) | ORAL | Status: DC

## 2015-05-03 MED ORDER — LEVOFLOXACIN 750 MG PO TABS: 750.0000 mg | ORAL_TABLET | Freq: Every day | ORAL | Status: DC

## 2015-05-03 MED ORDER — ALPRAZOLAM 0.5 MG PO TABS: 0.5000 mg | ORAL_TABLET | Freq: Two times a day (BID) | ORAL | Status: DC | PRN

## 2015-05-03 MED ORDER — PREDNISONE 20 MG PO TABS: 20.0000 mg | ORAL_TABLET | Freq: Every day | ORAL | Status: DC

## 2015-05-03 NOTE — Progress Notes (Signed)
Pts O2 sats 85% on RA at rest. Pt qualifies for home O2 due to COPD.

## 2015-05-03 NOTE — Care Management Note (Signed)
Case Management Note  Patient Details  Name: [name] Carter MRN: [id number] Date of Birth: 1960/08/27  Subjective/Objective:                    Action/Plan:   Expected Discharge Date:  04/30/15               Expected Discharge Plan:  Home/Self Care  In-House Referral:  NA  Discharge planning Services  CM Consult  Post Acute Care Choice:  Durable Medical Equipment Choice offered to:  Patient  DME Arranged:  Oxygen DME Agency:     HH Arranged:    HH Agency:     Status of Service:  Completed, signed off  Medicare Important Message Given:    Date Medicare IM Given:    Medicare IM give by:    Date Additional Medicare IM Given:    Additional Medicare Important Message give by:     If discussed at Long Length of Stay Meetings, dates discussed:    Additional Comments: Pt [facility] and they will deliver portable to pts room prior to discharge. Pt has a neb machine for home use. Pt and pts nurse aware of discharge arrangements. [name] Carter, [name] Loretto Saratogarowder, RN 05/03/2015, 12:32 PM

## 2015-05-03 NOTE — Discharge Summary (Signed)
Physician Discharge Summary  [name] Carter JWJ:[id number] DOB: Sep 03, 1960 DOA: 04/25/2015  PCP: [name]  Admit date: 04/25/2015 Discharge date: 05/03/2015  Time spent: 35 minutes  Recommendations for Outpatient Follow-up:  1. Follow up with PCP in one week. 2. Follow up with Dr Juanetta Gosling as soon as appointment available.  3. Likely will need to see ID for low IGG.   Discharge Diagnoses:  Active Problems:   Tobacco dependence   Acute respiratory failure with hypoxia (HCC)   COPD exacerbation (HCC)   Community acquired pneumonia   Discharge Condition: much improved.   Diet recommendation: cardiac healthy diet  Filed Weights   04/28/15 0452 04/29/15 0500 04/30/15 0500  Weight: 86.9 kg (191 lb 9.3 oz) 86.4 kg (190 lb 7.6 oz) 90.1 kg (198 lb 10.2 oz)    History of present illness: Patient was admitted into the ICU in respiratory extremis by dr Karilyn Cota on Apr 25, 2015.  As per his H and P:  " [name] Carter is a 54 y.o. male  This is a 54 year old man, with ongoing tobacco abuse, who has COPD presents with acute respiratory distress. He has become severely dyspneic over the course of the day. He says that he has been aching all over. On arrival to the emergency room, he was extremely hypoxic, wheezing in all lung fields and unable to speak in more than 2 word sentences. He has been apparently intubated twice in the past for severe respiratory distress. The emergency room physician felt that he was in severe respiratory distress and the patient is now intubated and mechanically ventilated. Therefore, he is unable to give me any clear history whatsoever. Chest x-ray shows evidence of left upper lobe pneumonia. He is now being admitted for further management.   Hospital Course:  [name] Carter is an 54 y.o. male with hx of severe COPD, active tobacco abuse, HTN, chronic pain on chronic narcotics, admitted for hypoxic respiratory failure requiring intubation, subsequently  extubated, and was treated with Rocephin and Zithromax for CAP.  He slowly improved and his steroid was tapered very slowly.  In addition, because of his significant wheezing, he was started on Terbutaline and titrated to 5mg  QID.  He was subsequently transferred to the floor.  He was seen and guided by Pulmonary, Dr Blase Mess.  He also recommended that he has a follow up CXR and offered follow up in his office upon discharge.  He also has had chronic pain, and had chronic narcotic dependency.  His Narcotics were carefully administered so that he can tolerate his pain, but no to excessively sedated him or compromising his respiratory status.  Because he told me he had several episodes of PNA, IGG, IGM, and IGA levels were checked to rule out combined variable immuno-deficiency syndrome (CVID), and his IGG level was low in the 500's.   I briefly spoke with Dr Orvan Falconer of ID, and he recommneded that patient be seen at the [facility] in consideration for IVIG infusion.  He is anxious to go home, and is stable for discharge.  He will be discharged on Levaquin for another 7 days, along with Prednisone.  I would like him seen by Dr Juanetta Gosling, PCP, and please refer him to infectious disease in consideration for IVIG infusion.  Thank you so much for allowing me to participate in his care.   Consultations:  Pulmonary:  Dr Juanetta Gosling.  Phone consultation:  Dr Orvan Falconer of Infectious Disease.   Discharge Exam: Filed Vitals:   05/03/15 6213  BP: 134/85  Pulse: 74  Temp: 99.4 F (37.4 C)  Resp: 18    Discharge Instructions   Discharge Instructions    Diet - low sodium heart healthy    Complete by:  As directed      Discharge instructions    Complete by:  As directed   Take your medicine as prescribed.  Follow up with your PCP and Dr Juanetta Gosling.  You will need to see infectious disease as well.     Increase activity slowly    Complete by:  As directed           Current Discharge Medication List    START  taking these medications   Details  ALPRAZolam (XANAX) 0.5 MG tablet Take 1 tablet (0.5 mg total) by mouth 2 (two) times daily as needed for anxiety. Qty: 20 tablet, Refills: 0    levofloxacin (LEVAQUIN) 750 MG tablet Take 1 tablet (750 mg total) by mouth daily. Qty: 7 tablet, Refills: 0    predniSONE (DELTASONE) 20 MG tablet Take 1 tablet (20 mg total) by mouth daily before breakfast. Qty: 5 tablet, Refills: 0    terbutaline (BRETHINE) 5 MG tablet Take 1 tablet (5 mg total) by mouth every 6 (six) hours. Qty: 120 tablet, Refills: 1      CONTINUE these medications which have NOT CHANGED   Details  albuterol (PROVENTIL HFA;VENTOLIN HFA) 108 (90 BASE) MCG/ACT inhaler Inhale 2 puffs into the lungs every 4 (four) hours as needed for wheezing or shortness of breath. Qty: 1 Inhaler, Refills: 1    DULoxetine (CYMBALTA) 30 MG capsule Take 30 mg by mouth 2 (two) times daily.    Ipratropium-Albuterol (COMBIVENT) 20-100 MCG/ACT AERS respimat Inhale 1 puff into the lungs 4 (four) times daily. Qty: 1 Inhaler, Refills: 1    Linaclotide (LINZESS) 145 MCG CAPS capsule Take 1 capsule (145 mcg total) by mouth daily before breakfast. For constipation Qty: 30 capsule, Refills: 5    oxyCODONE-acetaminophen (PERCOCET) 10-325 MG per tablet Take 1 tablet by mouth every 6 (six) hours as needed (severe pain). Qty: 20 tablet, Refills: 0    pantoprazole (PROTONIX) 40 MG tablet Take 1 tablet (40 mg total) by mouth daily before breakfast. Qty: 30 tablet, Refills: 5    promethazine (PHENERGAN) 12.5 MG tablet 1 po q6h prn nausea Qty: 12 tablet, Refills: 0    traZODone (DESYREL) 100 MG tablet Take 100 mg by mouth at bedtime.    Spacer/Aero-Holding Chambers (AEROCHAMBER PLUS WITH MASK) inhaler Use as instructed Qty: 1 each, Refills: 2      STOP taking these medications     albuterol (PROVENTIL) (2.5 MG/3ML) 0.083% nebulizer solution      cyclobenzaprine (FLEXERIL) 10 MG tablet        Allergies   Allergen Reactions  . Vicodin [Hydrocodone-Acetaminophen] Nausea And Vomiting and Other (See Comments)    Made patient feel funny, stomach pain      The results of significant diagnostics from this hospitalization (including imaging, microbiology, ancillary and laboratory) are listed below for reference.    Significant Diagnostic Studies: Dg Chest Port 1 View  04/27/2015  CLINICAL DATA:  54 year old male with history of acute renal failure and hypoxemia. Evaluate endotracheal tube placement. EXAM: PORTABLE CHEST 1 VIEW COMPARISON:  Chest x-ray 04/25/2015. FINDINGS: An endotracheal tube is in place with tip 6.2 cm above the carina. A nasogastric tube is seen extending into the stomach, however, the tip of the nasogastric tube extends below the lower margin of  the image. Lung volumes are normal. Persistent airspace consolidation in the left upper lobe appears very similar to the recent prior examination. Diffuse peribronchial cuffing. No pleural effusions. No definite cephalization of the pulmonary vasculature. Heart size is normal. Upper mediastinal contours are within normal limits. IMPRESSION: 1. Support apparatus, as above. 2. Persistent left upper lobe airspace consolidation concerning for pneumonia. 3. In addition, there is diffuse peribronchial cuffing, suggesting concurrent bronchitis. Electronically Signed   By: Trudie Reedaniel  Entrikin M.D.   On: 04/27/2015 00:49   Dg Chest Port 1 View  04/25/2015  CLINICAL DATA:  ETT placement, OG tube placed EXAM: PORTABLE CHEST 1 VIEW COMPARISON:  04/25/2015 FINDINGS: Endotracheal tube terminates at the thoracic inlet. Patchy left upper lobe opacity, suspicious for pneumonia. Right upper lobe opacity is less conspicuous/ obscured on the current study. No pleural effusion or pneumothorax. Enteric tube courses into the stomach. IMPRESSION: Endotracheal tube terminates at the thoracic inlet. Enteric tube courses into the stomach. Patchy left upper lobe opacity,  suspicious for pneumonia. Electronically Signed   By: Charline BillsSriyesh  Krishnan M.D.   On: 04/25/2015 15:06   Dg Chest Portable 1 View  04/25/2015  CLINICAL DATA:  Shortness of breath, chest pain and fever. Wheezing. EXAM: PORTABLE CHEST 1 VIEW COMPARISON:  08/20/2014 FINDINGS: Lungs are adequately inflated demonstrate hazy opacification over the upper lobes bilaterally which may be due to infection. No evidence of effusion. Cardiomediastinal silhouette is within normal. Remainder the exam is unchanged. IMPRESSION: Mild hazy airspace density over the upper lobes bilaterally which may be due to infection. Electronically Signed   By: Elberta Fortisaniel  Boyle M.D.   On: 04/25/2015 14:25    Microbiology: Recent Results (from the past 240 hour(s))  Blood Culture (routine x 2)     Status: None   Collection Time: 04/25/15  2:51 PM  Result Value Ref Range Status   Specimen Description BLOOD RIGHT HAND DRAWN BY RN  Final   Special Requests BOTTLES DRAWN AEROBIC AND ANAEROBIC 8CC EACH  Final   Culture NO GROWTH 5 DAYS  Final   Report Status 04/30/2015 FINAL  Final  Urine culture     Status: None   Collection Time: 04/25/15  2:51 PM  Result Value Ref Range Status   Specimen Description URINE, CATHETERIZED  Final   Special Requests NONE  Final   Culture   Final    NO GROWTH 2 DAYS Performed at [facility]     Report Status 04/27/2015 FINAL  Final  Blood Culture (routine x 2)     Status: None   Collection Time: 04/25/15  2:59 PM  Result Value Ref Range Status   Specimen Description BLOOD LEFT HAND  Final   Special Requests BOTTLES DRAWN AEROBIC AND ANAEROBIC 8CC EACH  Final   Culture NO GROWTH 5 DAYS  Final   Report Status 04/30/2015 FINAL  Final  MRSA PCR Screening     Status: None   Collection Time: 04/25/15  5:35 PM  Result Value Ref Range Status   MRSA by PCR NEGATIVE NEGATIVE Final    Comment:        The GeneXpert MRSA Assay (FDA approved for NASAL specimens only), is one component of  a comprehensive MRSA colonization surveillance program. It is not intended to diagnose MRSA infection nor to guide or monitor treatment for MRSA infections.   Culture, respiratory (NON-Expectorated)     Status: None   Collection Time: 04/25/15 11:45 PM  Result Value Ref Range Status   Specimen Description SPUTUM  Final   Special Requests NONE  Final   Gram Stain   Final    FEW WBC PRESENT,BOTH PMN AND MONONUCLEAR RARE SQUAMOUS EPITHELIAL CELLS PRESENT NO ORGANISMS SEEN Performed at [facility]    Culture   Final    NORMAL OROPHARYNGEAL FLORA Performed at [facility]    Report Status 04/28/2015 FINAL  Final     Labs: Basic Metabolic Panel:  Recent Labs Lab 04/27/15 0411 04/28/15 0450 05/01/15 0602 05/02/15 0555  NA 144 143 143  --   K 4.0 3.7 4.3  --   CL 116* 112* 105  --   CO2 23 24 32  --   GLUCOSE 146* 157* 126*  --   BUN --   CREATININE 0.69 0.82 0.67 0.53*  CALCIUM 8.1* 7.9* 8.4*  --    Liver Function Tests:  Recent Labs Lab 05/01/15 0602  AST 51*  ALT 112*  ALKPHOS 84  BILITOT 0.7  PROT 5.6*  ALBUMIN 2.6*   CBC:  Recent Labs Lab 04/27/15 0411 04/28/15 0450 05/01/15 0602  WBC 10.7* 17.8* 11.0*  HGB 12.5* 12.9* 12.5*  HCT 38.7* 40.3 38.5*  MCV 94.2 95.7 92.1  PLT 164 183 188    ProBNP (last 3 results)  Recent Labs  05/13/14 1352  PROBNP 120.7    Signed:  Haru Shaff  [facility] 05/03/2015, 12:25 PM

## 2015-05-03 NOTE — Progress Notes (Addendum)
He says he feels much better and thinks he is ready for discharge. He looks comfortable. His chest is actually clear today. He is in no distress.  I will plan to sign off. I will be happy to see him in follow-up in my office if his primary care team requests.  He will need chest x-ray in about 6 weeks. He needs to be on long-acting bronchodilators and I think he's or he got those at home.

## 2015-05-03 NOTE — Progress Notes (Signed)
Patient was discharged with instructions given on medications,and follow up visits,patient verbalized understanding. Patient discharged with oxygen at 3 liters nasal canula. Staff accompanied patient to awaiting vehicle.

## 2015-05-10 ENCOUNTER — Ambulatory Visit: Payer: Medicaid Other | Admitting: Gastroenterology

## 2015-05-20 ENCOUNTER — Encounter: Payer: Self-pay | Admitting: Gastroenterology

## 2015-05-20 ENCOUNTER — Ambulatory Visit (INDEPENDENT_AMBULATORY_CARE_PROVIDER_SITE_OTHER): Payer: Medicaid Other | Admitting: Gastroenterology

## 2015-05-20 VITALS — BP 118/74 | HR 62 | Temp 97.8°F | Ht 69.0 in | Wt 183.8 lb

## 2015-05-20 DIAGNOSIS — R1013 Epigastric pain: Secondary | ICD-10-CM

## 2015-05-20 DIAGNOSIS — K219 Gastro-esophageal reflux disease without esophagitis: Secondary | ICD-10-CM

## 2015-05-20 DIAGNOSIS — K5900 Constipation, unspecified: Secondary | ICD-10-CM | POA: Diagnosis not present

## 2015-05-20 MED ORDER — DEXLANSOPRAZOLE 60 MG PO CPDR: 60.0000 mg | DELAYED_RELEASE_CAPSULE | Freq: Every day | ORAL | Status: DC

## 2015-05-20 NOTE — Patient Instructions (Addendum)
1. Return to the office in three months for follow up of abdominal pain, GERD, constipation. Call sooner if needed. At time of next office visit we will discuss possible upper endoscopy. 2. Stop pantoprazole. Start Dexilant once daily before breakfast. We have provided samples and sent RX to your pharmacy. 3. Please discuss with PCP or Dr. Juanetta GoslingHawkins regarding seeing infectious disease specialist about recurrent pneumonia and low IgG.

## 2015-05-20 NOTE — Progress Notes (Signed)
[facility] Care Physician: [name]  Primary Gastroenterologist:  [name] SessionsMichael Rourk, MD   Chief Complaint  Patient presents with  . Follow-up    HPI: [name] StallsLeo Carter is a 54 y.o. male here for follow-up. He was initially seen in August 2016 for further evaluation of GERD at the request of his PCP. He has a history of chronic gastritis and intestinal metaplasia on biopsies in 2014. 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 Random colon biopsies negative. Tubular adenoma removed.  When I last saw patient he was complaining of abdominal swelling, upper abdominal burning worse with certain foods. Severe left lower quadrant pain radiating into the groin, chronic constipation. CT abdomen pelvis obtained showing bilateral renal cyst largest inferior pole left kidney 8.2 cm, small right inguinal hernia containing fat, underdistended stomach which cannot be further evaluated. Nothing to explain left lower quadrant pain.  Recently hospitalized with acute respiratory distress in the setting of COPD/pneumonia requiring intubation. Hospitalized for 1 week, discharged from October 31. Bump in transaminases in the setting of acute illness.   Patient complains of fatigue. Breathing much improved. Has to use oxygen 24 hours daily. No smoking in 25 days. Heartburn still poorly managed. He believes is related to the acetaminophen and his Percocet. He used to do better on oxycodone only. He has failed Nexium, pantoprazole, Zantac. Complains of epigastric burning. No dysphagia. Bowel movements are more regular using Linzess. Generally takes it every other day because it causes multiple loose stools. No melena rectal bleeding. Denies lower abdominal pain. Appetite returning. Weight is up.  We were contemplating upper endoscopy to further evaluate his upper GI symptoms as well as follow-up on chronic gastritis with intestinal metaplasia. Given recent events of pneumonia  requiring intubation, we need to postpone procedure.   Current Outpatient Prescriptions  Medication Sig Dispense Refill  . albuterol (PROVENTIL HFA;VENTOLIN HFA) 108 (90 BASE) MCG/ACT inhaler Inhale 2 puffs into the lungs every 4 (four) hours as needed for wheezing or shortness of breath. 1 Inhaler 1  . Ipratropium-Albuterol (COMBIVENT) 20-100 MCG/ACT AERS respimat Inhale 1 puff into the lungs 4 (four) times daily. 1 Inhaler 1  . Linaclotide (LINZESS) 145 MCG CAPS capsule Take 1 capsule (145 mcg total) by mouth daily before breakfast. For constipation 30 capsule 5  . oxyCODONE-acetaminophen (PERCOCET) 10-325 MG per tablet Take 1 tablet by mouth every 6 (six) hours as needed (severe pain). (Patient taking differently: Take 1 tablet by mouth every 4 (four) hours as needed (severe pain). ) 20 tablet 0  . pantoprazole (PROTONIX) 40 MG tablet Take 1 tablet (40 mg total) by mouth daily before breakfast. 30 tablet 5  . Spacer/Aero-Holding Chambers (AEROCHAMBER PLUS WITH MASK) inhaler Use as instructed 1 each 2   No current facility-administered medications for this visit.    Allergies as of 05/20/2015 - Review Complete 05/20/2015  Allergen Reaction Noted  . Vicodin [hydrocodone-acetaminophen] Nausea And Vomiting and Other (See Comments) 03/16/2014    ROS:  General: Negative for anorexia, weight loss, fever, chills, fatigue, weakness. ENT: Negative for hoarseness, difficulty swallowing , nasal congestion. CV: Negative for chest pain, angina, palpitations, dyspnea on exertion, peripheral edema.  Respiratory: Negative for dyspnea at rest, dyspnea on exertion, cough, sputum, wheezing.  GI: See history of present illness. GU:  Negative for dysuria, hematuria, urinary incontinence, urinary frequency, nocturnal urination.  Endo: Negative for unusual weight change.    Physical Examination:   BP 118/74 mmHg  Pulse 62  Temp(Src) 97.8 F (36.6 C) (Oral)  Ht  (1.753 m)  Wt 183 lb 12.8 oz  (83.371 kg)  BMI 27.13 kg/m2  General: Well-nourished, well-developed in no acute distress.  Eyes: No icterus. Mouth: Oropharyngeal mucosa moist and pink , no lesions erythema or exudate. Lungs: Clear to auscultation bilaterally.  Heart: Regular rate and rhythm, no murmurs rubs or gallops.  Abdomen: Bowel sounds are normal, nontender, nondistended, no hepatosplenomegaly or masses, no abdominal bruits or hernia , no rebound or guarding.   Extremities: No lower extremity edema. No clubbing or deformities. Neuro: Alert and oriented x 4   Skin: Warm and dry, no jaundice.   Psych: Alert and cooperative, normal mood and affect.  Labs:  Lab Results  Component Value Date   WBC 11.0* 05/01/2015   HGB 12.5* 05/01/2015   HCT 38.5* 05/01/2015   MCV 92.1 05/01/2015   PLT 188 05/01/2015   Lab Results  Component Value Date   CREATININE 0.53* 05/02/2015   BUN 17 05/01/2015   NA 143 05/01/2015   K 4.3 05/01/2015   CL 105 05/01/2015   CO2 32 05/01/2015   Lab Results  Component Value Date   ALT 112* 05/01/2015   AST 51* 05/01/2015   ALKPHOS 84 05/01/2015   BILITOT 0.7 05/01/2015    Imaging Studies: Dg Chest Port 1 View  04/27/2015  CLINICAL DATA:  54 year old male with history of acute renal failure and hypoxemia. Evaluate endotracheal tube placement. EXAM: PORTABLE CHEST 1 VIEW COMPARISON:  Chest x-ray 04/25/2015. FINDINGS: An endotracheal tube is in place with tip 6.2 cm above the carina. A nasogastric tube is seen extending into the stomach, however, the tip of the nasogastric tube extends below the lower margin of the image. Lung volumes are normal. Persistent airspace consolidation in the left upper lobe appears very similar to the recent prior examination. Diffuse peribronchial cuffing. No pleural effusions. No definite cephalization of the pulmonary vasculature. Heart size is normal. Upper mediastinal contours are within normal limits. IMPRESSION: 1. Support apparatus, as above. 2.  Persistent left upper lobe airspace consolidation concerning for pneumonia. 3. In addition, there is diffuse peribronchial cuffing, suggesting concurrent bronchitis. Electronically Signed   By: Trudie Reed M.D.   On: 04/27/2015 00:49   Dg Chest Port 1 View  04/25/2015  CLINICAL DATA:  ETT placement, OG tube placed EXAM: PORTABLE CHEST 1 VIEW COMPARISON:  04/25/2015 FINDINGS: Endotracheal tube terminates at the thoracic inlet. Patchy left upper lobe opacity, suspicious for pneumonia. Right upper lobe opacity is less conspicuous/ obscured on the current study. No pleural effusion or pneumothorax. Enteric tube courses into the stomach. IMPRESSION: Endotracheal tube terminates at the thoracic inlet. Enteric tube courses into the stomach. Patchy left upper lobe opacity, suspicious for pneumonia. Electronically Signed   By: Charline Bills M.D.   On: 04/25/2015 15:06   Dg Chest Portable 1 View  04/25/2015  CLINICAL DATA:  Shortness of breath, chest pain and fever. Wheezing. EXAM: PORTABLE CHEST 1 VIEW COMPARISON:  08/20/2014 FINDINGS: Lungs are adequately inflated demonstrate hazy opacification over the upper lobes bilaterally which may be due to infection. No evidence of effusion. Cardiomediastinal silhouette is within normal. Remainder the exam is unchanged. IMPRESSION: Mild hazy airspace density over the upper lobes bilaterally which may be due to infection. Electronically Signed   By: Elberta Fortis M.D.   On: 04/25/2015 14:25

## 2015-05-20 NOTE — Assessment & Plan Note (Signed)
Ongoing epigastric burning, heartburn symptoms in the setting of pantoprazole. Previously failed Nexium. Last EGD in 2014 as outlined above. We had planned for repeat upper endoscopy however given recent pneumonia requiring intubation, we will postpone procedure. Switch from pantoprazole to Dexilant 60 mg before breakfast. Samples and prescription provided. Patient is undergoing insurance changes and there may be coverage issues.  Anti-reflex measures discussed. He plans to discuss switching from Percocet to oxycodone with his pain management provider to see if this helps with his reflux symptoms.  Return to the office in 3 months. He will call sooner if needed. Consider upper endoscopy at that time if he continues to do well from a respiratory standpoint.

## 2015-05-20 NOTE — Assessment & Plan Note (Signed)
Continue Linzess daily on empty stomach. Hold for significant diarrhea. Return to the office in 3 months or call sooner if needed.

## 2015-05-24 NOTE — Progress Notes (Signed)
CC'D TO PCP °

## 2015-07-13 DIAGNOSIS — M79672 Pain in left foot: Secondary | ICD-10-CM | POA: Diagnosis not present

## 2015-07-13 DIAGNOSIS — Z79891 Long term (current) use of opiate analgesic: Secondary | ICD-10-CM | POA: Diagnosis not present

## 2015-07-13 DIAGNOSIS — M79671 Pain in right foot: Secondary | ICD-10-CM | POA: Diagnosis not present

## 2015-07-13 DIAGNOSIS — G894 Chronic pain syndrome: Secondary | ICD-10-CM | POA: Diagnosis not present

## 2015-07-14 DIAGNOSIS — J441 Chronic obstructive pulmonary disease with (acute) exacerbation: Secondary | ICD-10-CM | POA: Diagnosis not present

## 2015-07-14 DIAGNOSIS — M545 Low back pain: Secondary | ICD-10-CM | POA: Diagnosis not present

## 2015-07-21 DIAGNOSIS — Z23 Encounter for immunization: Secondary | ICD-10-CM | POA: Diagnosis not present

## 2015-07-21 DIAGNOSIS — J449 Chronic obstructive pulmonary disease, unspecified: Secondary | ICD-10-CM | POA: Diagnosis not present

## 2015-07-21 DIAGNOSIS — D809 Immunodeficiency with predominantly antibody defects, unspecified: Secondary | ICD-10-CM | POA: Diagnosis not present

## 2015-08-12 DIAGNOSIS — Z79891 Long term (current) use of opiate analgesic: Secondary | ICD-10-CM | POA: Diagnosis not present

## 2015-08-12 DIAGNOSIS — M79605 Pain in left leg: Secondary | ICD-10-CM | POA: Diagnosis not present

## 2015-08-12 DIAGNOSIS — M79604 Pain in right leg: Secondary | ICD-10-CM | POA: Diagnosis not present

## 2015-08-12 DIAGNOSIS — M545 Low back pain: Secondary | ICD-10-CM | POA: Diagnosis not present

## 2015-08-12 DIAGNOSIS — M542 Cervicalgia: Secondary | ICD-10-CM | POA: Diagnosis not present

## 2015-08-12 DIAGNOSIS — G894 Chronic pain syndrome: Secondary | ICD-10-CM | POA: Diagnosis not present

## 2015-09-09 DIAGNOSIS — M79605 Pain in left leg: Secondary | ICD-10-CM | POA: Diagnosis not present

## 2015-09-09 DIAGNOSIS — G894 Chronic pain syndrome: Secondary | ICD-10-CM | POA: Diagnosis not present

## 2015-09-09 DIAGNOSIS — M79604 Pain in right leg: Secondary | ICD-10-CM | POA: Diagnosis not present

## 2015-09-09 DIAGNOSIS — Z79891 Long term (current) use of opiate analgesic: Secondary | ICD-10-CM | POA: Diagnosis not present

## 2015-09-09 DIAGNOSIS — M545 Low back pain: Secondary | ICD-10-CM | POA: Diagnosis not present

## 2015-09-09 DIAGNOSIS — M542 Cervicalgia: Secondary | ICD-10-CM | POA: Diagnosis not present

## 2015-09-29 DIAGNOSIS — Z79891 Long term (current) use of opiate analgesic: Secondary | ICD-10-CM | POA: Diagnosis not present

## 2015-09-29 DIAGNOSIS — G894 Chronic pain syndrome: Secondary | ICD-10-CM | POA: Diagnosis not present

## 2015-09-29 DIAGNOSIS — M79605 Pain in left leg: Secondary | ICD-10-CM | POA: Diagnosis not present

## 2015-09-29 DIAGNOSIS — M545 Low back pain: Secondary | ICD-10-CM | POA: Diagnosis not present

## 2015-09-29 DIAGNOSIS — M79604 Pain in right leg: Secondary | ICD-10-CM | POA: Diagnosis not present

## 2015-09-29 DIAGNOSIS — M542 Cervicalgia: Secondary | ICD-10-CM | POA: Diagnosis not present

## 2015-10-09 DIAGNOSIS — J189 Pneumonia, unspecified organism: Secondary | ICD-10-CM | POA: Diagnosis not present

## 2015-10-11 DIAGNOSIS — M79605 Pain in left leg: Secondary | ICD-10-CM | POA: Diagnosis not present

## 2015-10-11 DIAGNOSIS — M79604 Pain in right leg: Secondary | ICD-10-CM | POA: Diagnosis not present

## 2015-10-11 DIAGNOSIS — M545 Low back pain: Secondary | ICD-10-CM | POA: Diagnosis not present

## 2015-10-11 DIAGNOSIS — G894 Chronic pain syndrome: Secondary | ICD-10-CM | POA: Diagnosis not present

## 2015-10-11 DIAGNOSIS — Z79891 Long term (current) use of opiate analgesic: Secondary | ICD-10-CM | POA: Diagnosis not present

## 2015-10-11 DIAGNOSIS — M542 Cervicalgia: Secondary | ICD-10-CM | POA: Diagnosis not present

## 2015-10-19 DIAGNOSIS — J441 Chronic obstructive pulmonary disease with (acute) exacerbation: Secondary | ICD-10-CM | POA: Diagnosis not present

## 2015-10-19 DIAGNOSIS — J189 Pneumonia, unspecified organism: Secondary | ICD-10-CM | POA: Diagnosis not present

## 2015-10-19 DIAGNOSIS — D809 Immunodeficiency with predominantly antibody defects, unspecified: Secondary | ICD-10-CM | POA: Diagnosis not present

## 2015-11-10 DIAGNOSIS — M542 Cervicalgia: Secondary | ICD-10-CM | POA: Diagnosis not present

## 2015-11-10 DIAGNOSIS — G890 Central pain syndrome: Secondary | ICD-10-CM | POA: Diagnosis not present

## 2015-11-10 DIAGNOSIS — Z79891 Long term (current) use of opiate analgesic: Secondary | ICD-10-CM | POA: Diagnosis not present

## 2015-11-10 DIAGNOSIS — M79605 Pain in left leg: Secondary | ICD-10-CM | POA: Diagnosis not present

## 2015-11-10 DIAGNOSIS — M545 Low back pain: Secondary | ICD-10-CM | POA: Diagnosis not present

## 2015-11-10 DIAGNOSIS — M79604 Pain in right leg: Secondary | ICD-10-CM | POA: Diagnosis not present

## 2015-11-10 DIAGNOSIS — G894 Chronic pain syndrome: Secondary | ICD-10-CM | POA: Diagnosis not present

## 2015-12-10 DIAGNOSIS — G8929 Other chronic pain: Secondary | ICD-10-CM | POA: Diagnosis not present

## 2015-12-10 DIAGNOSIS — M542 Cervicalgia: Secondary | ICD-10-CM | POA: Diagnosis not present

## 2015-12-10 DIAGNOSIS — M79672 Pain in left foot: Secondary | ICD-10-CM | POA: Diagnosis not present

## 2015-12-10 DIAGNOSIS — G894 Chronic pain syndrome: Secondary | ICD-10-CM | POA: Diagnosis not present

## 2015-12-10 DIAGNOSIS — M79605 Pain in left leg: Secondary | ICD-10-CM | POA: Diagnosis not present

## 2015-12-10 DIAGNOSIS — M79604 Pain in right leg: Secondary | ICD-10-CM | POA: Diagnosis not present

## 2015-12-10 DIAGNOSIS — Z79891 Long term (current) use of opiate analgesic: Secondary | ICD-10-CM | POA: Diagnosis not present

## 2015-12-10 DIAGNOSIS — M79671 Pain in right foot: Secondary | ICD-10-CM | POA: Diagnosis not present

## 2015-12-10 DIAGNOSIS — M545 Low back pain: Secondary | ICD-10-CM | POA: Diagnosis not present

## 2015-12-18 DIAGNOSIS — R918 Other nonspecific abnormal finding of lung field: Secondary | ICD-10-CM | POA: Diagnosis not present

## 2015-12-18 DIAGNOSIS — R05 Cough: Secondary | ICD-10-CM | POA: Diagnosis not present

## 2015-12-18 DIAGNOSIS — Z885 Allergy status to narcotic agent status: Secondary | ICD-10-CM | POA: Diagnosis not present

## 2015-12-18 DIAGNOSIS — K219 Gastro-esophageal reflux disease without esophagitis: Secondary | ICD-10-CM | POA: Diagnosis not present

## 2015-12-18 DIAGNOSIS — J440 Chronic obstructive pulmonary disease with acute lower respiratory infection: Secondary | ICD-10-CM | POA: Diagnosis not present

## 2015-12-18 DIAGNOSIS — M545 Low back pain: Secondary | ICD-10-CM | POA: Diagnosis not present

## 2015-12-18 DIAGNOSIS — Z8249 Family history of ischemic heart disease and other diseases of the circulatory system: Secondary | ICD-10-CM | POA: Diagnosis not present

## 2015-12-18 DIAGNOSIS — R0789 Other chest pain: Secondary | ICD-10-CM | POA: Diagnosis not present

## 2015-12-18 DIAGNOSIS — I272 Other secondary pulmonary hypertension: Secondary | ICD-10-CM | POA: Diagnosis not present

## 2015-12-18 DIAGNOSIS — Z7951 Long term (current) use of inhaled steroids: Secondary | ICD-10-CM | POA: Diagnosis not present

## 2015-12-18 DIAGNOSIS — F17210 Nicotine dependence, cigarettes, uncomplicated: Secondary | ICD-10-CM | POA: Diagnosis not present

## 2015-12-18 DIAGNOSIS — R062 Wheezing: Secondary | ICD-10-CM | POA: Diagnosis not present

## 2015-12-18 DIAGNOSIS — Z8701 Personal history of pneumonia (recurrent): Secondary | ICD-10-CM | POA: Diagnosis not present

## 2015-12-18 DIAGNOSIS — Z888 Allergy status to other drugs, medicaments and biological substances status: Secondary | ICD-10-CM | POA: Diagnosis not present

## 2015-12-18 DIAGNOSIS — J9621 Acute and chronic respiratory failure with hypoxia: Secondary | ICD-10-CM | POA: Diagnosis not present

## 2015-12-18 DIAGNOSIS — D72829 Elevated white blood cell count, unspecified: Secondary | ICD-10-CM | POA: Diagnosis not present

## 2015-12-18 DIAGNOSIS — T380X5A Adverse effect of glucocorticoids and synthetic analogues, initial encounter: Secondary | ICD-10-CM | POA: Diagnosis not present

## 2015-12-18 DIAGNOSIS — J45909 Unspecified asthma, uncomplicated: Secondary | ICD-10-CM | POA: Diagnosis not present

## 2015-12-18 DIAGNOSIS — R0602 Shortness of breath: Secondary | ICD-10-CM | POA: Diagnosis not present

## 2015-12-18 DIAGNOSIS — J441 Chronic obstructive pulmonary disease with (acute) exacerbation: Secondary | ICD-10-CM | POA: Diagnosis not present

## 2015-12-19 DIAGNOSIS — J209 Acute bronchitis, unspecified: Secondary | ICD-10-CM | POA: Diagnosis not present

## 2015-12-19 DIAGNOSIS — F17210 Nicotine dependence, cigarettes, uncomplicated: Secondary | ICD-10-CM | POA: Diagnosis not present

## 2015-12-19 DIAGNOSIS — J449 Chronic obstructive pulmonary disease, unspecified: Secondary | ICD-10-CM | POA: Diagnosis not present

## 2015-12-19 DIAGNOSIS — J45909 Unspecified asthma, uncomplicated: Secondary | ICD-10-CM | POA: Diagnosis not present

## 2015-12-20 DIAGNOSIS — J449 Chronic obstructive pulmonary disease, unspecified: Secondary | ICD-10-CM | POA: Diagnosis not present

## 2015-12-20 DIAGNOSIS — J441 Chronic obstructive pulmonary disease with (acute) exacerbation: Secondary | ICD-10-CM | POA: Diagnosis not present

## 2015-12-20 DIAGNOSIS — R05 Cough: Secondary | ICD-10-CM | POA: Diagnosis not present

## 2015-12-20 DIAGNOSIS — Z720 Tobacco use: Secondary | ICD-10-CM | POA: Diagnosis not present

## 2015-12-20 DIAGNOSIS — R0600 Dyspnea, unspecified: Secondary | ICD-10-CM | POA: Diagnosis not present

## 2015-12-20 DIAGNOSIS — R062 Wheezing: Secondary | ICD-10-CM | POA: Diagnosis not present

## 2015-12-20 DIAGNOSIS — Z48813 Encounter for surgical aftercare following surgery on the respiratory system: Secondary | ICD-10-CM | POA: Diagnosis not present

## 2015-12-21 DIAGNOSIS — R0600 Dyspnea, unspecified: Secondary | ICD-10-CM | POA: Diagnosis not present

## 2015-12-21 DIAGNOSIS — R05 Cough: Secondary | ICD-10-CM | POA: Diagnosis not present

## 2015-12-21 DIAGNOSIS — Z720 Tobacco use: Secondary | ICD-10-CM | POA: Diagnosis not present

## 2015-12-21 DIAGNOSIS — J441 Chronic obstructive pulmonary disease with (acute) exacerbation: Secondary | ICD-10-CM | POA: Diagnosis not present

## 2015-12-21 DIAGNOSIS — R062 Wheezing: Secondary | ICD-10-CM | POA: Diagnosis not present

## 2015-12-22 DIAGNOSIS — R0600 Dyspnea, unspecified: Secondary | ICD-10-CM | POA: Diagnosis not present

## 2015-12-22 DIAGNOSIS — R062 Wheezing: Secondary | ICD-10-CM | POA: Diagnosis not present

## 2015-12-22 DIAGNOSIS — J441 Chronic obstructive pulmonary disease with (acute) exacerbation: Secondary | ICD-10-CM | POA: Diagnosis not present

## 2015-12-22 DIAGNOSIS — R05 Cough: Secondary | ICD-10-CM | POA: Diagnosis not present

## 2015-12-22 DIAGNOSIS — R0602 Shortness of breath: Secondary | ICD-10-CM | POA: Diagnosis not present

## 2015-12-22 DIAGNOSIS — Z720 Tobacco use: Secondary | ICD-10-CM | POA: Diagnosis not present

## 2015-12-23 DIAGNOSIS — Z720 Tobacco use: Secondary | ICD-10-CM | POA: Diagnosis not present

## 2015-12-23 DIAGNOSIS — J441 Chronic obstructive pulmonary disease with (acute) exacerbation: Secondary | ICD-10-CM | POA: Diagnosis not present

## 2015-12-23 DIAGNOSIS — R05 Cough: Secondary | ICD-10-CM | POA: Diagnosis not present

## 2015-12-23 DIAGNOSIS — R062 Wheezing: Secondary | ICD-10-CM | POA: Diagnosis not present

## 2015-12-23 DIAGNOSIS — R0600 Dyspnea, unspecified: Secondary | ICD-10-CM | POA: Diagnosis not present

## 2015-12-24 DIAGNOSIS — Z720 Tobacco use: Secondary | ICD-10-CM | POA: Diagnosis not present

## 2015-12-24 DIAGNOSIS — R0600 Dyspnea, unspecified: Secondary | ICD-10-CM | POA: Diagnosis not present

## 2015-12-24 DIAGNOSIS — R0602 Shortness of breath: Secondary | ICD-10-CM | POA: Diagnosis not present

## 2015-12-24 DIAGNOSIS — R05 Cough: Secondary | ICD-10-CM | POA: Diagnosis not present

## 2015-12-24 DIAGNOSIS — R062 Wheezing: Secondary | ICD-10-CM | POA: Diagnosis not present

## 2015-12-24 DIAGNOSIS — J441 Chronic obstructive pulmonary disease with (acute) exacerbation: Secondary | ICD-10-CM | POA: Diagnosis not present

## 2015-12-25 DIAGNOSIS — J449 Chronic obstructive pulmonary disease, unspecified: Secondary | ICD-10-CM | POA: Diagnosis not present

## 2015-12-25 DIAGNOSIS — F17210 Nicotine dependence, cigarettes, uncomplicated: Secondary | ICD-10-CM | POA: Diagnosis not present

## 2016-01-10 DIAGNOSIS — G894 Chronic pain syndrome: Secondary | ICD-10-CM | POA: Diagnosis not present

## 2016-01-10 DIAGNOSIS — Z79891 Long term (current) use of opiate analgesic: Secondary | ICD-10-CM | POA: Diagnosis not present

## 2016-01-10 DIAGNOSIS — M79604 Pain in right leg: Secondary | ICD-10-CM | POA: Diagnosis not present

## 2016-01-10 DIAGNOSIS — M79605 Pain in left leg: Secondary | ICD-10-CM | POA: Diagnosis not present

## 2016-01-10 DIAGNOSIS — M545 Low back pain: Secondary | ICD-10-CM | POA: Diagnosis not present

## 2016-01-10 DIAGNOSIS — M542 Cervicalgia: Secondary | ICD-10-CM | POA: Diagnosis not present

## 2016-01-12 DIAGNOSIS — J449 Chronic obstructive pulmonary disease, unspecified: Secondary | ICD-10-CM | POA: Diagnosis not present

## 2016-01-12 DIAGNOSIS — A310 Pulmonary mycobacterial infection: Secondary | ICD-10-CM | POA: Diagnosis not present

## 2016-01-16 IMAGING — CR DG CHEST 2V
2 series · 2 of 2 positions shown · non-contrast
Comparison: 04/24/2014

CLINICAL DATA: Shortness of breath and productive cough.

EXAM:
CHEST  2 VIEW

[view not recorded (1 of 2)]
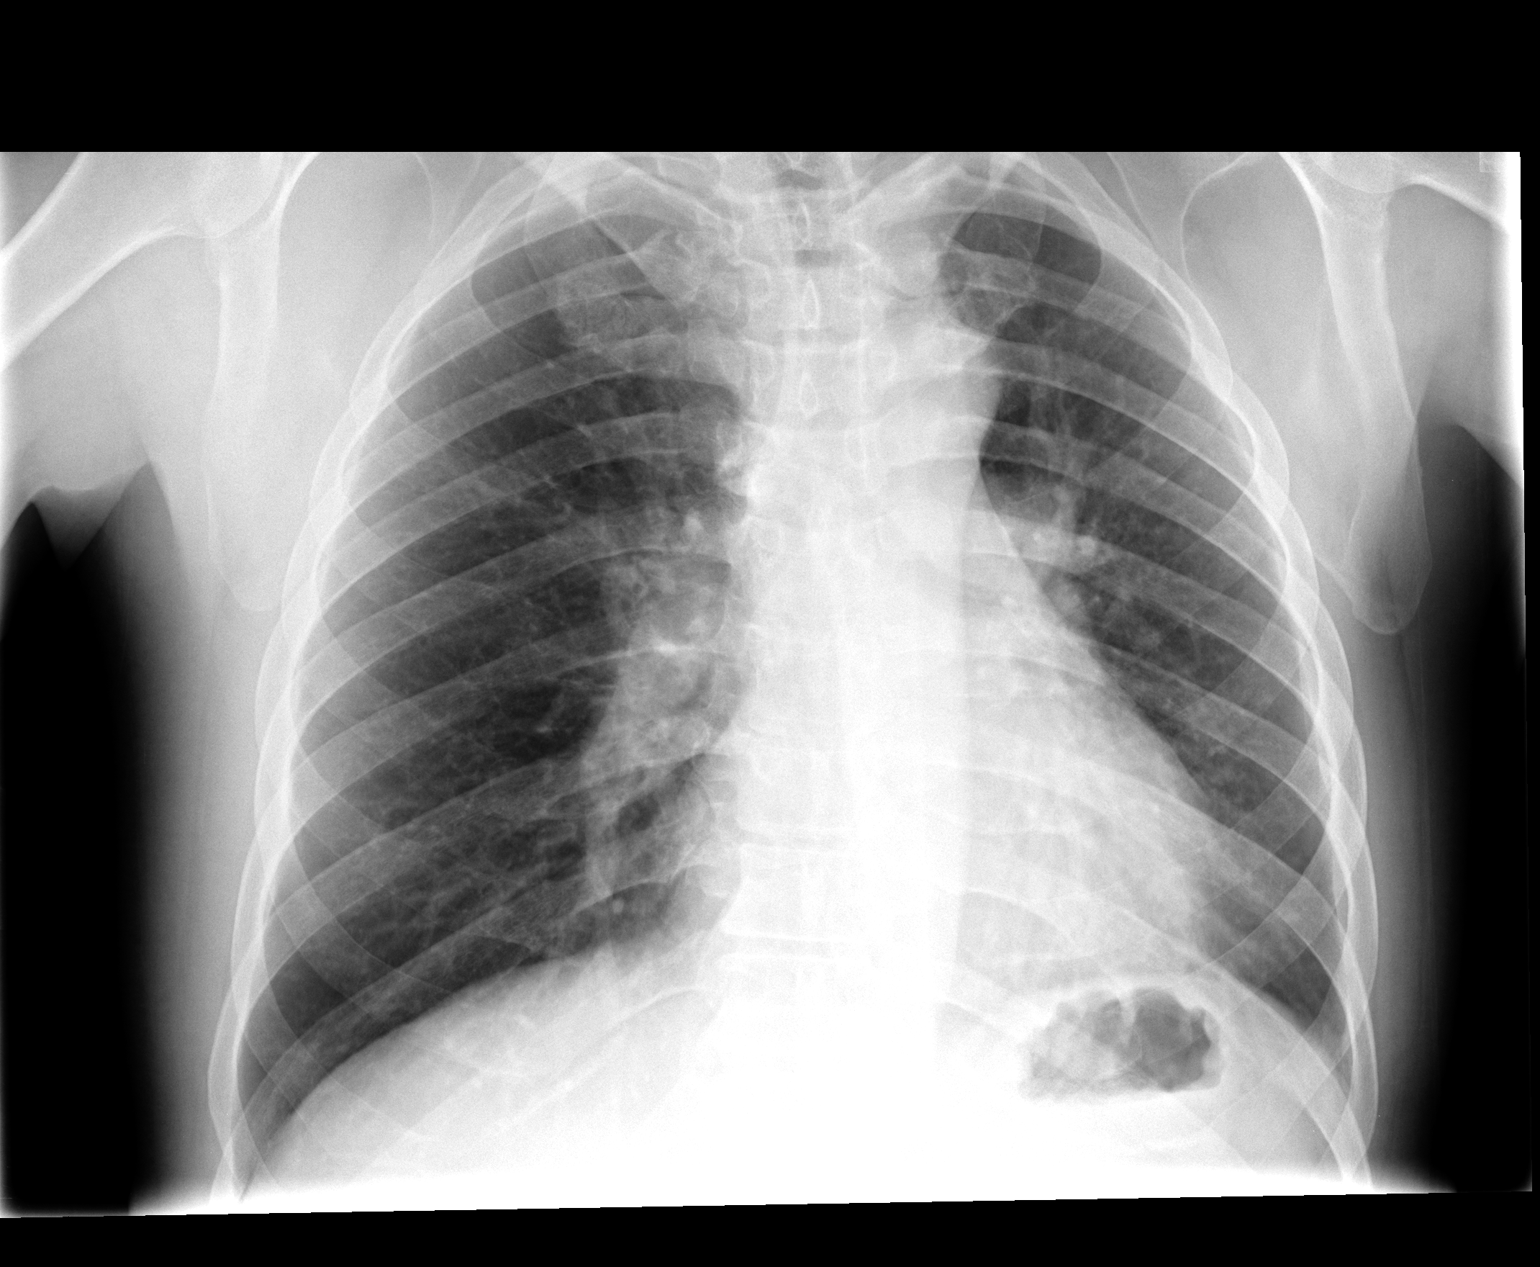

[view not recorded (2 of 2)]
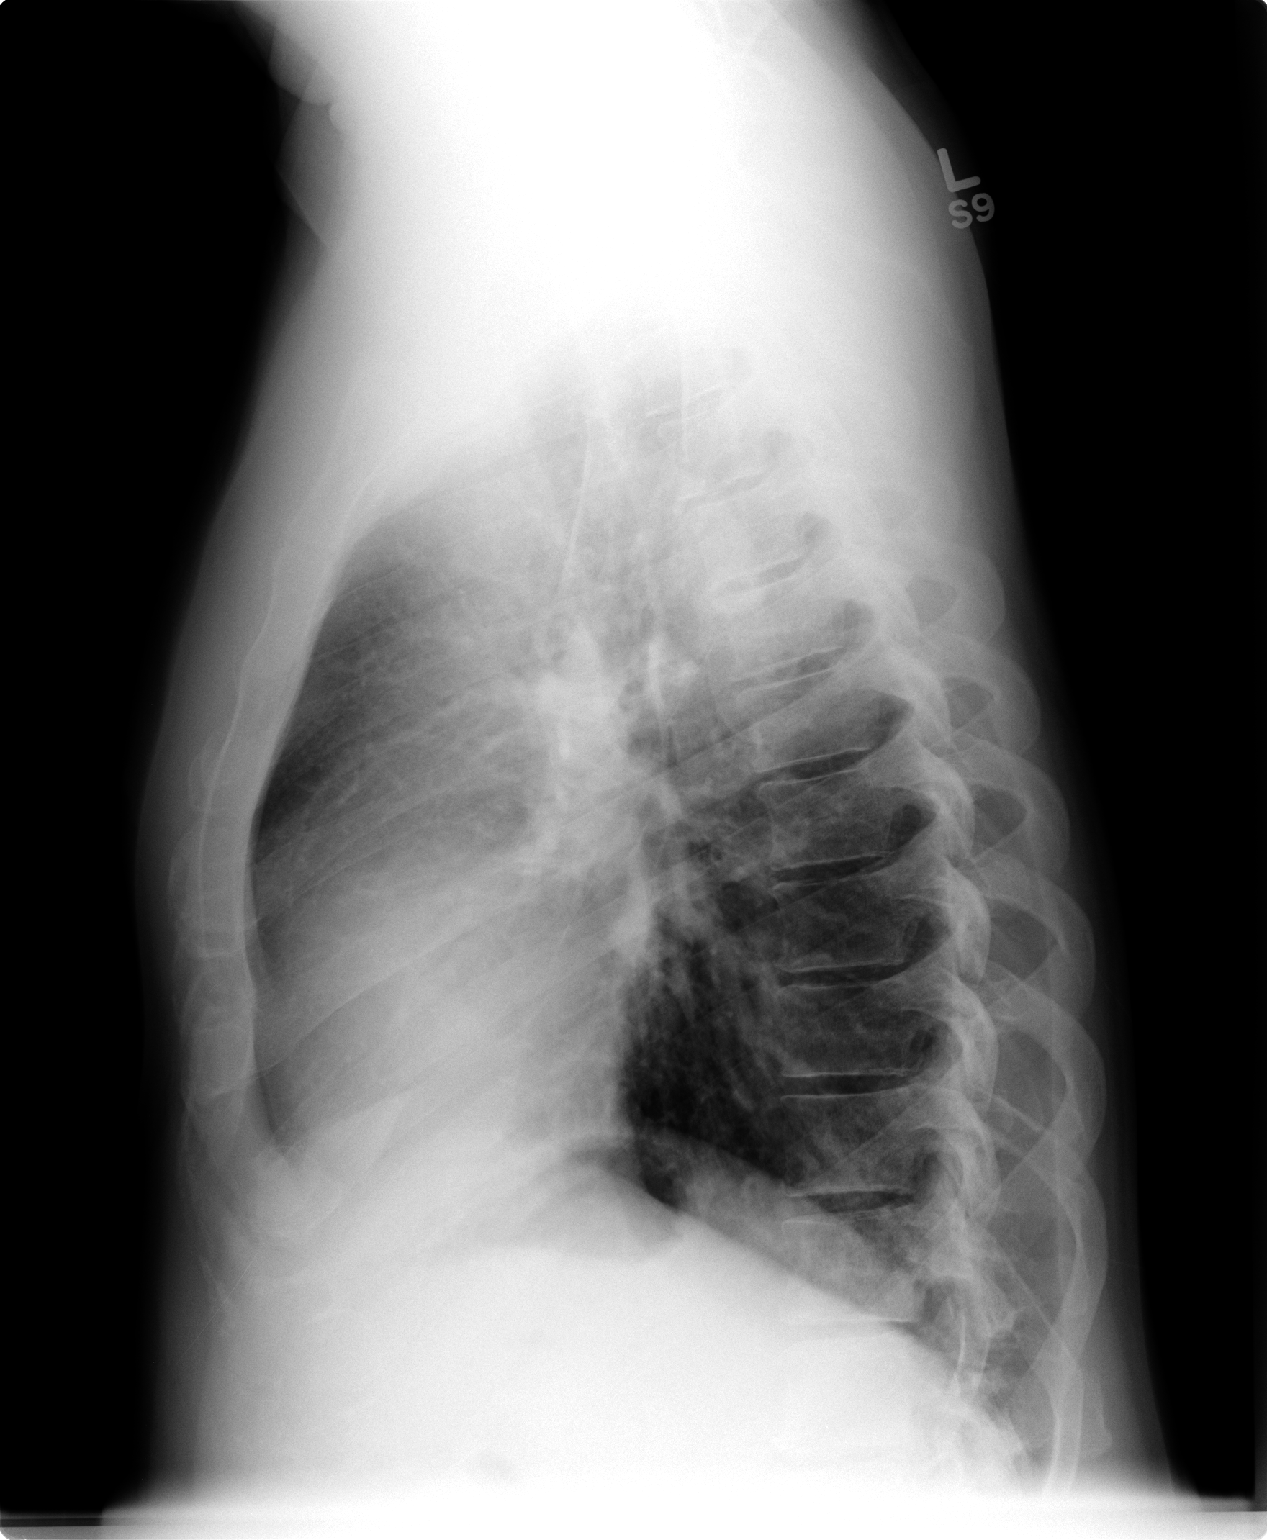

[2 of 2 positions shown; findings below may reference images not displayed]

FINDINGS: Asymmetric inflation, with volume loss on the left and streaky
retrocardiac opacity in the frontal projection. No lobar collapse or
consolidation seen in the lateral projection. Normal heart size and
aortic contours. There is no edema, consolidation, effusion, or
pneumothorax.
IMPRESSION: Retrocardiac opacity, likely atelectasis.

## 2016-02-02 DIAGNOSIS — F419 Anxiety disorder, unspecified: Secondary | ICD-10-CM | POA: Diagnosis not present

## 2016-02-02 DIAGNOSIS — A310 Pulmonary mycobacterial infection: Secondary | ICD-10-CM | POA: Diagnosis not present

## 2016-02-02 DIAGNOSIS — J449 Chronic obstructive pulmonary disease, unspecified: Secondary | ICD-10-CM | POA: Diagnosis not present

## 2016-02-18 DIAGNOSIS — J449 Chronic obstructive pulmonary disease, unspecified: Secondary | ICD-10-CM | POA: Diagnosis not present

## 2016-02-18 DIAGNOSIS — F419 Anxiety disorder, unspecified: Secondary | ICD-10-CM | POA: Diagnosis not present

## 2016-02-18 DIAGNOSIS — A310 Pulmonary mycobacterial infection: Secondary | ICD-10-CM | POA: Diagnosis not present

## 2016-03-23 ENCOUNTER — Ambulatory Visit: Payer: Medicare Other | Admitting: Internal Medicine

## 2016-04-18 ENCOUNTER — Ambulatory Visit: Payer: Medicare Other | Admitting: Internal Medicine

## 2016-05-01 ENCOUNTER — Ambulatory Visit: Payer: Medicare Other | Admitting: Internal Medicine

## 2016-06-01 ENCOUNTER — Ambulatory Visit: Payer: Medicare Other | Admitting: Internal Medicine

## 2016-06-06 ENCOUNTER — Ambulatory Visit: Payer: Medicare Other | Admitting: Internal Medicine

## 2016-06-13 ENCOUNTER — Ambulatory Visit: Payer: Medicare Other | Admitting: Internal Medicine

## 2016-07-09 DIAGNOSIS — J449 Chronic obstructive pulmonary disease, unspecified: Secondary | ICD-10-CM | POA: Diagnosis not present

## 2016-07-09 DIAGNOSIS — J9600 Acute respiratory failure, unspecified whether with hypoxia or hypercapnia: Secondary | ICD-10-CM | POA: Diagnosis not present

## 2016-07-10 DIAGNOSIS — J9600 Acute respiratory failure, unspecified whether with hypoxia or hypercapnia: Secondary | ICD-10-CM | POA: Diagnosis not present

## 2016-07-10 DIAGNOSIS — J449 Chronic obstructive pulmonary disease, unspecified: Secondary | ICD-10-CM | POA: Diagnosis not present

## 2016-07-20 ENCOUNTER — Ambulatory Visit: Payer: Medicare Other | Admitting: Internal Medicine

## 2016-08-07 ENCOUNTER — Telehealth: Payer: Self-pay | Admitting: *Deleted

## 2016-08-07 NOTE — Telephone Encounter (Signed)
Patient called to reschedule appt from a no show due to weather on 07/20/16. Noted multiple cancellations since 03/2016. Patient stated he had multiple family issues and rescheduled for 09/19/16 with Dr. Luciana Axeomer. Advised not certain he will be able to be rescheduled in the future without a new referral.

## 2016-08-09 DIAGNOSIS — J449 Chronic obstructive pulmonary disease, unspecified: Secondary | ICD-10-CM | POA: Diagnosis not present

## 2016-08-09 DIAGNOSIS — J9600 Acute respiratory failure, unspecified whether with hypoxia or hypercapnia: Secondary | ICD-10-CM | POA: Diagnosis not present

## 2016-08-10 DIAGNOSIS — J9600 Acute respiratory failure, unspecified whether with hypoxia or hypercapnia: Secondary | ICD-10-CM | POA: Diagnosis not present

## 2016-08-10 DIAGNOSIS — J449 Chronic obstructive pulmonary disease, unspecified: Secondary | ICD-10-CM | POA: Diagnosis not present

## 2016-08-23 DIAGNOSIS — M542 Cervicalgia: Secondary | ICD-10-CM | POA: Diagnosis not present

## 2016-08-23 DIAGNOSIS — M545 Low back pain: Secondary | ICD-10-CM | POA: Diagnosis not present

## 2016-08-23 DIAGNOSIS — J449 Chronic obstructive pulmonary disease, unspecified: Secondary | ICD-10-CM | POA: Diagnosis not present

## 2016-08-23 DIAGNOSIS — A310 Pulmonary mycobacterial infection: Secondary | ICD-10-CM | POA: Diagnosis not present

## 2016-09-06 DIAGNOSIS — J449 Chronic obstructive pulmonary disease, unspecified: Secondary | ICD-10-CM | POA: Diagnosis not present

## 2016-09-06 DIAGNOSIS — J9600 Acute respiratory failure, unspecified whether with hypoxia or hypercapnia: Secondary | ICD-10-CM | POA: Diagnosis not present

## 2016-09-07 DIAGNOSIS — J9600 Acute respiratory failure, unspecified whether with hypoxia or hypercapnia: Secondary | ICD-10-CM | POA: Diagnosis not present

## 2016-09-07 DIAGNOSIS — J449 Chronic obstructive pulmonary disease, unspecified: Secondary | ICD-10-CM | POA: Diagnosis not present

## 2016-09-19 ENCOUNTER — Encounter: Payer: Self-pay | Admitting: Internal Medicine

## 2016-09-19 ENCOUNTER — Ambulatory Visit
Admission: RE | Admit: 2016-09-19 | Discharge: 2016-09-19 | Disposition: A | Discharge status: Home / Self Care | Payer: PPO | Source: Ambulatory Visit | Attending: Internal Medicine | Admitting: Internal Medicine

## 2016-09-19 ENCOUNTER — Ambulatory Visit (INDEPENDENT_AMBULATORY_CARE_PROVIDER_SITE_OTHER): Payer: PPO | Admitting: Internal Medicine

## 2016-09-19 DIAGNOSIS — A310 Pulmonary mycobacterial infection: Secondary | ICD-10-CM

## 2016-09-19 DIAGNOSIS — A319 Mycobacterial infection, unspecified: Secondary | ICD-10-CM | POA: Diagnosis not present

## 2016-09-19 NOTE — Progress Notes (Signed)
Patient ID: [name] Carter, male   DOB: October 14, 1960, 56 y.o.   MRN: [id number]      [facility]      Reason for Consult: pulmonary MAI infection    Referring Physician: Dr. Juanetta Gosling    Patient ID: [name] Carter, male    DOB: 04/02/1961, 56 y.o.   MRN: [id number]  HPI:   He comes in for evaluation for above.  Diagnosis of this comes from a hospitalization he had at [facility] in [place] in June of 2017.  He presented there with sob, cough and noted to to have pneumonia and treated with nebulizers and antibiotics and discharged after a week.  He had a bronchoscopy at the time and later found that it grew MAI (record not available).  He was started on appropriate 3 drug therapy with clarithromycin 500 mg, rifampin 600 mg and ethambutol 1200 mg daily but he did not tolerate it long. He had various complaints with it.  He was referred in August but after multiple no shows did make it in today.  He has been on cycling antibiotics between levaquin, amoxicillin and something else.  He has a history of intubation about 5 or 6 times in the past.  He currently is on amoxicillin for the last 2 months.  He is not wearing oxygen.    Previous record reviewed from the hospital in [place] and no sputum culture reported, I believe this came out after discharge.  His CT report noted multifocal ground-glass opacities and was on prednisone at the time and underwent bal then, as listed above.   No current fever or chills.    Past Medical History:  Diagnosis Date  . Chronic back pain   . COPD (chronic obstructive pulmonary disease) (HCC)   . DDD (degenerative disc disease), cervical   . DDD (degenerative disc disease), lumbar   . HTN (hypertension)   . Neck pain   . Pneumonia     Prior to Admission medications   Medication Sig Start Date End Date Taking? Authorizing Provider  albuterol (PROVENTIL HFA;VENTOLIN HFA) 108 (90 BASE) MCG/ACT inhaler Inhale 2 puffs into the lungs every  4 (four) hours as needed for wheezing or shortness of breath. 08/21/14  Yes Standley Brooking, MD  amoxicillin (AMOXIL) 500 MG capsule Take 500 mg by mouth 3 (three) times daily.   Yes Historical Provider, MD  Ipratropium-Albuterol (COMBIVENT) 20-100 MCG/ACT AERS respimat Inhale 1 puff into the lungs 4 (four) times daily. 08/21/14  Yes Standley Brooking, MD  Spacer/Aero-Holding Chambers (AEROCHAMBER PLUS WITH MASK) inhaler Use as instructed 05/05/14  Yes Doug Sou, MD  dexlansoprazole (DEXILANT) 60 MG capsule Take 1 capsule (60 mg total) by mouth daily. 05/20/15   Tiffany Kocher, PA-C    Allergies  Allergen Reactions  . Vicodin [Hydrocodone-Acetaminophen] Nausea And Vomiting and Other (See Comments)    Made patient feel funny, stomach pain    Social History  Substance Use Topics  . Smoking status: Heavy Tobacco Smoker    Packs/day: 1.50    Types: Cigarettes  . Smokeless tobacco: Never Used     Comment: one pack daily  . Alcohol use No    Family History  Problem Relation Age of Onset  . Heart attack Father   . Colon cancer Father     diagnosed in his 63s    Review of Systems  Constitutional: negative for fevers, chills and anorexia Respiratory: positive for cough or sputum, negative for hemoptysis Gastrointestinal:  negative for diarrhea Integument/breast: negative for rash All other systems reviewed and are negative    Constitutional: in no apparent distress  Vitals:   09/19/16 0843  BP: 136/90  Pulse: 60  Temp: 98.3 F (36.8 C)   EYES: anicteric ENMT: Cardiovascular: Cor RRR Respiratory: diffuse wheezing throughout, increased work of breathing following deep breaths GI: Bowel sounds are normal, soft, nt Musculoskeletal: no pedal edema noted Skin: negatives: no rash Hematologic: no cervical lad  Labs: Lab Results  Component Value Date   WBC 11.0 (H) 05/01/2015   HGB 12.5 (L) 05/01/2015   HCT 38.5 (L) 05/01/2015   MCV 92.1 05/01/2015   PLT 188 05/01/2015      Lab Results  Component Value Date   CREATININE 0.53 (L) 05/02/2015   BUN 17 05/01/2015   NA 143 05/01/2015   K 4.3 05/01/2015   CL 105 05/01/2015   CO2 32 05/01/2015    Lab Results  Component Value Date   ALT 112 (H) 05/01/2015   AST 51 (H) 05/01/2015   ALKPHOS 84 05/01/2015   BILITOT 0.7 05/01/2015     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  I discussed with him the treatment options that are limited and really for any chance of clearing this infection, he will need to restart the three drug regimen.  Our ID pharmacist discussed options including alternating days with the medications and taking them 3 times per week.  I did not appreciate any cavitation on the CT report but can check a CXR today.  I first though will see in his sputum if it is persistent prior to treatment and evaluate the CXR today.  I of course emphasized the critical need to quit smoking.  I let him know that with effective treatment, it remains only about a 60-65% chance of eradication of this over 1-2 years.  I also will consider IV amikacin in the future if difficult to clear.  I also would like him to stop other antibiotics as I do not feel there is benefit and want to have him focus on the MAI treatment.  COPD smoker    Plan: 1) CXR 2) sputum AFB 3) will consider treatment if positive 4) he will return in 2 weeks with our PharmD to start treatment if indicated 5) advised to quit smoking 6) stop amoxicillin  Thanks for the consultation

## 2016-09-25 ENCOUNTER — Other Ambulatory Visit: Payer: PPO

## 2016-09-25 DIAGNOSIS — A310 Pulmonary mycobacterial infection: Secondary | ICD-10-CM

## 2016-10-07 DIAGNOSIS — J449 Chronic obstructive pulmonary disease, unspecified: Secondary | ICD-10-CM | POA: Diagnosis not present

## 2016-10-07 DIAGNOSIS — J9600 Acute respiratory failure, unspecified whether with hypoxia or hypercapnia: Secondary | ICD-10-CM | POA: Diagnosis not present

## 2016-10-08 DIAGNOSIS — J449 Chronic obstructive pulmonary disease, unspecified: Secondary | ICD-10-CM | POA: Diagnosis not present

## 2016-10-08 DIAGNOSIS — J9600 Acute respiratory failure, unspecified whether with hypoxia or hypercapnia: Secondary | ICD-10-CM | POA: Diagnosis not present

## 2016-10-09 ENCOUNTER — Telehealth: Payer: Self-pay | Admitting: Pharmacist

## 2016-10-09 NOTE — Telephone Encounter (Signed)
[name] Carter was supposed to have a f/u appointment with me tomorrow to review his chest xray and sputum culture for MAC.  Spoke with Dr. Linus Salmons about this patient today. His chest xray is normal and his AFB smear was negative.  His culture is negative so far.  Called patient and told him that everything was negative so far and he doesn't need to f/u with me tomorrow.  Will call him if it becomes positive, otherwise he can f/u with his pulmonologist. Called patient and told him information, he agreed with plan.

## 2016-10-10 ENCOUNTER — Ambulatory Visit: Payer: 59

## 2016-11-06 DIAGNOSIS — J449 Chronic obstructive pulmonary disease, unspecified: Secondary | ICD-10-CM | POA: Diagnosis not present

## 2016-11-06 DIAGNOSIS — J9600 Acute respiratory failure, unspecified whether with hypoxia or hypercapnia: Secondary | ICD-10-CM | POA: Diagnosis not present

## 2016-11-07 DIAGNOSIS — J9600 Acute respiratory failure, unspecified whether with hypoxia or hypercapnia: Secondary | ICD-10-CM | POA: Diagnosis not present

## 2016-11-07 DIAGNOSIS — J449 Chronic obstructive pulmonary disease, unspecified: Secondary | ICD-10-CM | POA: Diagnosis not present

## 2016-11-09 LAB — AFB CULTURE WITH SMEAR (NOT AT ARMC)

## 2016-12-07 DIAGNOSIS — J449 Chronic obstructive pulmonary disease, unspecified: Secondary | ICD-10-CM | POA: Diagnosis not present

## 2016-12-07 DIAGNOSIS — J9600 Acute respiratory failure, unspecified whether with hypoxia or hypercapnia: Secondary | ICD-10-CM | POA: Diagnosis not present

## 2016-12-08 DIAGNOSIS — J9600 Acute respiratory failure, unspecified whether with hypoxia or hypercapnia: Secondary | ICD-10-CM | POA: Diagnosis not present

## 2016-12-08 DIAGNOSIS — J449 Chronic obstructive pulmonary disease, unspecified: Secondary | ICD-10-CM | POA: Diagnosis not present

## 2016-12-21 DIAGNOSIS — J449 Chronic obstructive pulmonary disease, unspecified: Secondary | ICD-10-CM | POA: Diagnosis not present

## 2016-12-21 DIAGNOSIS — R21 Rash and other nonspecific skin eruption: Secondary | ICD-10-CM | POA: Diagnosis not present

## 2016-12-21 DIAGNOSIS — D809 Immunodeficiency with predominantly antibody defects, unspecified: Secondary | ICD-10-CM | POA: Diagnosis not present

## 2016-12-21 DIAGNOSIS — A310 Pulmonary mycobacterial infection: Secondary | ICD-10-CM | POA: Diagnosis not present

## 2017-01-06 DIAGNOSIS — J449 Chronic obstructive pulmonary disease, unspecified: Secondary | ICD-10-CM | POA: Diagnosis not present

## 2017-01-07 DIAGNOSIS — J449 Chronic obstructive pulmonary disease, unspecified: Secondary | ICD-10-CM | POA: Diagnosis not present

## 2017-01-25 ENCOUNTER — Other Ambulatory Visit (HOSPITAL_COMMUNITY): Payer: Self-pay | Admitting: Pulmonary Disease

## 2017-01-25 DIAGNOSIS — M542 Cervicalgia: Secondary | ICD-10-CM

## 2017-01-29 ENCOUNTER — Ambulatory Visit (HOSPITAL_COMMUNITY): Payer: PPO

## 2017-01-31 ENCOUNTER — Ambulatory Visit (HOSPITAL_COMMUNITY)
Admission: RE | Admit: 2017-01-31 | Discharge: 2017-01-31 | Disposition: A | Discharge status: Home / Self Care | Payer: PPO | Source: Ambulatory Visit | Attending: Pulmonary Disease | Admitting: Pulmonary Disease

## 2017-01-31 DIAGNOSIS — R600 Localized edema: Secondary | ICD-10-CM | POA: Insufficient documentation

## 2017-01-31 DIAGNOSIS — M5030 Other cervical disc degeneration, unspecified cervical region: Secondary | ICD-10-CM | POA: Diagnosis not present

## 2017-01-31 DIAGNOSIS — M4802 Spinal stenosis, cervical region: Secondary | ICD-10-CM | POA: Insufficient documentation

## 2017-01-31 DIAGNOSIS — M542 Cervicalgia: Secondary | ICD-10-CM

## 2017-02-06 DIAGNOSIS — J449 Chronic obstructive pulmonary disease, unspecified: Secondary | ICD-10-CM | POA: Diagnosis not present

## 2017-02-07 ENCOUNTER — Other Ambulatory Visit: Payer: Self-pay | Admitting: Pulmonary Disease

## 2017-02-07 DIAGNOSIS — J449 Chronic obstructive pulmonary disease, unspecified: Secondary | ICD-10-CM | POA: Diagnosis not present

## 2017-02-07 DIAGNOSIS — M542 Cervicalgia: Secondary | ICD-10-CM

## 2017-03-01 ENCOUNTER — Ambulatory Visit
Admission: RE | Admit: 2017-03-01 | Discharge: 2017-03-01 | Disposition: A | Discharge status: Home / Self Care | Payer: PPO | Source: Ambulatory Visit | Attending: Pulmonary Disease | Admitting: Pulmonary Disease

## 2017-03-01 ENCOUNTER — Other Ambulatory Visit: Payer: Self-pay | Admitting: Pulmonary Disease

## 2017-03-01 DIAGNOSIS — M542 Cervicalgia: Secondary | ICD-10-CM

## 2017-03-01 DIAGNOSIS — M47812 Spondylosis without myelopathy or radiculopathy, cervical region: Secondary | ICD-10-CM | POA: Diagnosis not present

## 2017-03-01 MED ORDER — DEXAMETHASONE SODIUM PHOSPHATE 4 MG/ML IJ SOLN
6.0000 mg | Freq: Once | INTRAMUSCULAR | Status: AC
  Administered 2017-03-01: 6 mg via INTRA_ARTICULAR

## 2017-03-01 MED ORDER — IOPAMIDOL (ISOVUE-M 300) INJECTION 61%
1.0000 mL | Freq: Once | INTRAMUSCULAR | Status: AC | PRN
  Administered 2017-03-01: 1 mL via INTRA_ARTICULAR

## 2017-03-01 NOTE — Discharge Instructions (Signed)

## 2017-03-09 DIAGNOSIS — J449 Chronic obstructive pulmonary disease, unspecified: Secondary | ICD-10-CM | POA: Diagnosis not present

## 2017-03-10 DIAGNOSIS — J449 Chronic obstructive pulmonary disease, unspecified: Secondary | ICD-10-CM | POA: Diagnosis not present

## 2017-03-23 ENCOUNTER — Other Ambulatory Visit: Payer: Self-pay | Admitting: Pulmonary Disease

## 2017-03-23 DIAGNOSIS — M542 Cervicalgia: Secondary | ICD-10-CM

## 2017-04-04 ENCOUNTER — Inpatient Hospital Stay
Admission: RE | Admit: 2017-04-04 | Discharge: 2017-04-04 | Disposition: A | Discharge status: Home / Self Care | Payer: PPO | Source: Ambulatory Visit | Attending: Pulmonary Disease | Admitting: Pulmonary Disease

## 2017-04-04 NOTE — Discharge Instructions (Signed)

## 2017-04-08 DIAGNOSIS — J449 Chronic obstructive pulmonary disease, unspecified: Secondary | ICD-10-CM | POA: Diagnosis not present

## 2017-04-09 DIAGNOSIS — J449 Chronic obstructive pulmonary disease, unspecified: Secondary | ICD-10-CM | POA: Diagnosis not present

## 2017-04-20 ENCOUNTER — Other Ambulatory Visit: Payer: PPO

## 2017-04-26 ENCOUNTER — Other Ambulatory Visit: Payer: PPO

## 2017-05-01 DIAGNOSIS — F17200 Nicotine dependence, unspecified, uncomplicated: Secondary | ICD-10-CM | POA: Diagnosis not present

## 2017-05-01 DIAGNOSIS — M4712 Other spondylosis with myelopathy, cervical region: Secondary | ICD-10-CM | POA: Diagnosis not present

## 2017-05-01 DIAGNOSIS — J441 Chronic obstructive pulmonary disease with (acute) exacerbation: Secondary | ICD-10-CM | POA: Diagnosis not present

## 2017-05-01 DIAGNOSIS — F419 Anxiety disorder, unspecified: Secondary | ICD-10-CM | POA: Diagnosis not present

## 2017-05-03 ENCOUNTER — Inpatient Hospital Stay
Admission: RE | Admit: 2017-05-03 | Discharge: 2017-05-03 | Disposition: A | Discharge status: Home / Self Care | Payer: PPO | Source: Ambulatory Visit | Attending: Pulmonary Disease | Admitting: Pulmonary Disease

## 2017-05-09 DIAGNOSIS — J449 Chronic obstructive pulmonary disease, unspecified: Secondary | ICD-10-CM | POA: Diagnosis not present

## 2017-05-10 DIAGNOSIS — J449 Chronic obstructive pulmonary disease, unspecified: Secondary | ICD-10-CM | POA: Diagnosis not present

## 2017-05-18 ENCOUNTER — Ambulatory Visit
Admission: RE | Admit: 2017-05-18 | Discharge: 2017-05-18 | Disposition: A | Discharge status: Home / Self Care | Payer: PPO | Source: Ambulatory Visit | Attending: Pulmonary Disease | Admitting: Pulmonary Disease

## 2017-05-18 ENCOUNTER — Other Ambulatory Visit: Payer: Self-pay | Admitting: Pulmonary Disease

## 2017-05-18 DIAGNOSIS — M542 Cervicalgia: Secondary | ICD-10-CM

## 2017-05-18 DIAGNOSIS — M47812 Spondylosis without myelopathy or radiculopathy, cervical region: Secondary | ICD-10-CM | POA: Diagnosis not present

## 2017-05-18 MED ORDER — IOPAMIDOL (ISOVUE-M 300) INJECTION 61%
1.0000 mL | Freq: Once | INTRAMUSCULAR | Status: AC | PRN
  Administered 2017-05-18: 1 mL via INTRA_ARTICULAR

## 2017-05-18 MED ORDER — DEXAMETHASONE SODIUM PHOSPHATE 4 MG/ML IJ SOLN
4.0000 mg | Freq: Once | INTRAMUSCULAR | Status: AC
  Administered 2017-05-18: 4 mg via INTRA_ARTICULAR

## 2017-06-08 DIAGNOSIS — J449 Chronic obstructive pulmonary disease, unspecified: Secondary | ICD-10-CM | POA: Diagnosis not present

## 2017-06-09 DIAGNOSIS — J449 Chronic obstructive pulmonary disease, unspecified: Secondary | ICD-10-CM | POA: Diagnosis not present

## 2017-06-13 ENCOUNTER — Other Ambulatory Visit: Payer: Self-pay | Admitting: Pulmonary Disease

## 2017-06-13 DIAGNOSIS — M542 Cervicalgia: Secondary | ICD-10-CM

## 2017-06-27 ENCOUNTER — Other Ambulatory Visit: Payer: PPO

## 2017-07-09 DIAGNOSIS — J449 Chronic obstructive pulmonary disease, unspecified: Secondary | ICD-10-CM | POA: Diagnosis not present

## 2017-07-10 DIAGNOSIS — J449 Chronic obstructive pulmonary disease, unspecified: Secondary | ICD-10-CM | POA: Diagnosis not present

## 2017-08-02 ENCOUNTER — Other Ambulatory Visit (HOSPITAL_COMMUNITY): Payer: Self-pay | Admitting: Pulmonary Disease

## 2017-08-02 DIAGNOSIS — F419 Anxiety disorder, unspecified: Secondary | ICD-10-CM | POA: Diagnosis not present

## 2017-08-02 DIAGNOSIS — J449 Chronic obstructive pulmonary disease, unspecified: Secondary | ICD-10-CM | POA: Diagnosis not present

## 2017-08-02 DIAGNOSIS — A310 Pulmonary mycobacterial infection: Secondary | ICD-10-CM | POA: Diagnosis not present

## 2017-08-02 DIAGNOSIS — N281 Cyst of kidney, acquired: Secondary | ICD-10-CM

## 2017-08-02 DIAGNOSIS — M542 Cervicalgia: Secondary | ICD-10-CM | POA: Diagnosis not present

## 2017-08-03 DIAGNOSIS — J449 Chronic obstructive pulmonary disease, unspecified: Secondary | ICD-10-CM | POA: Diagnosis not present

## 2017-08-03 DIAGNOSIS — D809 Immunodeficiency with predominantly antibody defects, unspecified: Secondary | ICD-10-CM | POA: Diagnosis not present

## 2017-08-03 DIAGNOSIS — M542 Cervicalgia: Secondary | ICD-10-CM | POA: Diagnosis not present

## 2017-08-03 DIAGNOSIS — A310 Pulmonary mycobacterial infection: Secondary | ICD-10-CM | POA: Diagnosis not present

## 2017-08-06 ENCOUNTER — Ambulatory Visit (HOSPITAL_COMMUNITY)
Admission: RE | Admit: 2017-08-06 | Discharge: 2017-08-06 | Disposition: A | Discharge status: Home / Self Care | Payer: PPO | Source: Ambulatory Visit | Attending: Pulmonary Disease | Admitting: Pulmonary Disease

## 2017-08-06 DIAGNOSIS — N281 Cyst of kidney, acquired: Secondary | ICD-10-CM | POA: Diagnosis not present

## 2017-08-08 ENCOUNTER — Telehealth: Payer: Self-pay | Admitting: *Deleted

## 2017-08-08 DIAGNOSIS — F17210 Nicotine dependence, cigarettes, uncomplicated: Secondary | ICD-10-CM

## 2017-08-08 DIAGNOSIS — Z122 Encounter for screening for malignant neoplasm of respiratory organs: Secondary | ICD-10-CM

## 2017-08-10 NOTE — Telephone Encounter (Signed)
ATC - Line busy - WCB 

## 2017-08-10 NOTE — Telephone Encounter (Signed)
Spoke with pt and scheduled SDMV 08/22/17 10:00 CT ordered Nothing further needed

## 2017-08-21 ENCOUNTER — Other Ambulatory Visit (HOSPITAL_COMMUNITY): Payer: Self-pay | Admitting: Student

## 2017-08-21 DIAGNOSIS — R030 Elevated blood-pressure reading, without diagnosis of hypertension: Secondary | ICD-10-CM | POA: Diagnosis not present

## 2017-08-21 DIAGNOSIS — Z6827 Body mass index (BMI) 27.0-27.9, adult: Secondary | ICD-10-CM | POA: Diagnosis not present

## 2017-08-21 DIAGNOSIS — M542 Cervicalgia: Secondary | ICD-10-CM | POA: Diagnosis not present

## 2017-08-21 DIAGNOSIS — M5412 Radiculopathy, cervical region: Secondary | ICD-10-CM | POA: Diagnosis not present

## 2017-08-22 ENCOUNTER — Ambulatory Visit (INDEPENDENT_AMBULATORY_CARE_PROVIDER_SITE_OTHER): Payer: PPO | Admitting: Acute Care

## 2017-08-22 ENCOUNTER — Ambulatory Visit
Admission: RE | Admit: 2017-08-22 | Discharge: 2017-08-22 | Disposition: A | Discharge status: Home / Self Care | Payer: PPO | Source: Ambulatory Visit | Attending: Acute Care | Admitting: Acute Care

## 2017-08-22 ENCOUNTER — Encounter: Payer: Self-pay | Admitting: Acute Care

## 2017-08-22 DIAGNOSIS — F17210 Nicotine dependence, cigarettes, uncomplicated: Secondary | ICD-10-CM

## 2017-08-22 DIAGNOSIS — Z122 Encounter for screening for malignant neoplasm of respiratory organs: Secondary | ICD-10-CM

## 2017-08-22 NOTE — Progress Notes (Signed)
Shared Decision Making Visit Lung Cancer Screening Program [phone]([id number])   Eligibility:  Age 57 y.o.  Pack Years Smoking History Calculation 82 pack year smoking history (# packs/per year x # years smoked)  Recent History of coughing up blood  no  Unexplained weight loss? no ( >Than 15 pounds within the last 6 months )  Prior History Lung / other cancer no (Diagnosis within the last 5 years already requiring surveillance chest CT Scans).  Smoking Status Current Smoker  Former Smokers: Years since quit: NA  Quit Date: NA  Visit Components:  Discussion included one or more decision making aids. yes  Discussion included risk/benefits of screening. yes  Discussion included potential follow up diagnostic testing for abnormal scans. yes  Discussion included meaning and risk of over diagnosis. yes  Discussion included meaning and risk of False Positives. yes  Discussion included meaning of total radiation exposure. yes  Counseling Included:  Importance of adherence to annual lung cancer LDCT screening. yes  Impact of comorbidities on ability to participate in the program. yes  Ability and willingness to under diagnostic treatment. yes  Smoking Cessation Counseling:  Current Smokers:   Discussed importance of smoking cessation. yes  Information about tobacco cessation classes and interventions provided to patient. yes  Patient provided with "ticket" for LDCT Scan. yes  Symptomatic Patient. no  Counseling  Diagnosis Code: Tobacco Use Z72.0  Asymptomatic Patient yes  Counseling (Intermediate counseling: > three minutes counseling) [id number]  Former Smokers:   Discussed the importance of maintaining cigarette abstinence. yes  Diagnosis Code: Personal History of Nicotine Dependence. W29.562Z87.891  Information about tobacco cessation classes and interventions provided to patient. Yes  Patient provided with "ticket" for LDCT Scan. yes  Written Order for Lung Cancer  Screening with LDCT placed in Epic. Yes (CT Chest Lung Cancer Screening Low Dose W/O CM) [id number] Z12.2-Screening of respiratory organs Z87.891-Personal history of nicotine dependence  I have spent 25 minutes of face to face time with Mr. [name] Carter discussing the risks and benefits of lung cancer screening. We viewed a power point together that explained in detail the above noted topics. We paused at intervals to allow for questions to be asked and answered to ensure understanding.We discussed that the single most powerful action that he can take to decrease his risk of developing lung cancer is to quit smoking. We discussed whether or not he is ready to commit to setting a quit date. We discussed options for tools to aid in quitting smoking including nicotine replacement therapy, non-nicotine medications, support groups, Quit Smart classes, and behavior modification. We discussed that often times setting smaller, more achievable goals, such as eliminating 1 cigarette a day for a week and then 2 cigarettes a day for a week can be helpful in slowly decreasing the number of cigarettes smoked. This allows for a sense of accomplishment as well as providing a clinical benefit. I gave him the " Be Stronger Than Your Excuses" card with contact information for community resources, classes, free nicotine replacement therapy, and access to mobile apps, text messaging, and on-line smoking cessation help. I have also given him my card and contact information in the event he needs to contact me. We discussed the time and location of the scan, and that either Abigail Miyamotoenise Phelps RN or I will call with the results within 24-48 hours of receiving them. I have offered him  a copy of the power point we viewed  as a resource in the event they need reinforcement of  the concepts we discussed today in the office. The patient verbalized understanding of all of  the above and had no further questions upon leaving the office. They have my  contact information in the event they have any further questions.  I spent 4 minutes counseling on smoking cessation and the health risks of continued tobacco abuse.  I explained to the patient that there has been a high incidence of coronary artery disease noted on these exams. I explained that this is a non-gated exam therefore degree or severity cannot be determined. This patient is noton statin therapy. I have asked the patient to follow-up with their PCP regarding any incidental finding of coronary artery disease and management with diet or medication as their PCP  feels is clinically indicated. The patient verbalized understanding of the above and had no further questions upon completion of the visit.      Bevelyn Ngo, NP 08/22/2017

## 2017-08-23 ENCOUNTER — Ambulatory Visit (HOSPITAL_COMMUNITY)
Admission: RE | Admit: 2017-08-23 | Discharge: 2017-08-23 | Disposition: A | Discharge status: Home / Self Care | Payer: PPO | Source: Ambulatory Visit | Attending: Pulmonary Disease | Admitting: Pulmonary Disease

## 2017-08-23 ENCOUNTER — Other Ambulatory Visit (HOSPITAL_COMMUNITY): Payer: Self-pay | Admitting: Pulmonary Disease

## 2017-08-23 DIAGNOSIS — R059 Cough, unspecified: Secondary | ICD-10-CM

## 2017-08-23 DIAGNOSIS — R05 Cough: Secondary | ICD-10-CM

## 2017-08-24 ENCOUNTER — Telehealth: Payer: Self-pay | Admitting: Acute Care

## 2017-08-24 NOTE — Telephone Encounter (Signed)
Synetta Failnita, can you reschedule his LDCT?  [facility] but had to cancel CT due to sickness.

## 2017-08-24 NOTE — Telephone Encounter (Signed)
Mr. Freida BusmanCadorette already had a CT scheduled at [place] [facility] on 08/29/2017 @ 7:00pm and they were able to schedule the LCS CT at 6:30pm for him on the same day. He is aware of this appt change and it has been approved with [employer]

## 2017-08-29 ENCOUNTER — Ambulatory Visit (HOSPITAL_COMMUNITY)
Admission: RE | Admit: 2017-08-29 | Discharge: 2017-08-29 | Disposition: A | Discharge status: Home / Self Care | Payer: PPO | Source: Ambulatory Visit | Attending: Student | Admitting: Student

## 2017-08-29 ENCOUNTER — Ambulatory Visit (HOSPITAL_COMMUNITY): Payer: PPO

## 2017-08-29 DIAGNOSIS — M47813 Spondylosis without myelopathy or radiculopathy, cervicothoracic region: Secondary | ICD-10-CM | POA: Diagnosis not present

## 2017-08-29 DIAGNOSIS — M1288 Other specific arthropathies, not elsewhere classified, other specified site: Secondary | ICD-10-CM | POA: Insufficient documentation

## 2017-08-29 DIAGNOSIS — M542 Cervicalgia: Secondary | ICD-10-CM | POA: Insufficient documentation

## 2017-08-29 DIAGNOSIS — I6523 Occlusion and stenosis of bilateral carotid arteries: Secondary | ICD-10-CM | POA: Diagnosis not present

## 2017-08-30 ENCOUNTER — Other Ambulatory Visit: Payer: Self-pay | Admitting: Acute Care

## 2017-08-30 ENCOUNTER — Ambulatory Visit (HOSPITAL_COMMUNITY)
Admission: RE | Admit: 2017-08-30 | Discharge: 2017-08-30 | Disposition: A | Discharge status: Home / Self Care | Payer: PPO | Source: Ambulatory Visit | Attending: Acute Care | Admitting: Acute Care

## 2017-08-30 DIAGNOSIS — Z122 Encounter for screening for malignant neoplasm of respiratory organs: Secondary | ICD-10-CM | POA: Insufficient documentation

## 2017-08-30 DIAGNOSIS — K8020 Calculus of gallbladder without cholecystitis without obstruction: Secondary | ICD-10-CM | POA: Diagnosis not present

## 2017-08-30 DIAGNOSIS — J432 Centrilobular emphysema: Secondary | ICD-10-CM | POA: Diagnosis not present

## 2017-08-30 DIAGNOSIS — J438 Other emphysema: Secondary | ICD-10-CM | POA: Diagnosis not present

## 2017-08-30 DIAGNOSIS — J439 Emphysema, unspecified: Secondary | ICD-10-CM | POA: Diagnosis not present

## 2017-08-30 DIAGNOSIS — I2510 Atherosclerotic heart disease of native coronary artery without angina pectoris: Secondary | ICD-10-CM | POA: Diagnosis not present

## 2017-08-30 DIAGNOSIS — I700 Atherosclerosis of aorta: Secondary | ICD-10-CM | POA: Insufficient documentation

## 2017-08-30 DIAGNOSIS — Z720 Tobacco use: Secondary | ICD-10-CM | POA: Insufficient documentation

## 2017-08-31 ENCOUNTER — Telehealth: Payer: Self-pay | Admitting: Acute Care

## 2017-08-31 ENCOUNTER — Other Ambulatory Visit: Payer: Self-pay | Admitting: Acute Care

## 2017-08-31 DIAGNOSIS — Z122 Encounter for screening for malignant neoplasm of respiratory organs: Secondary | ICD-10-CM

## 2017-08-31 DIAGNOSIS — F17210 Nicotine dependence, cigarettes, uncomplicated: Principal | ICD-10-CM

## 2017-08-31 DIAGNOSIS — Z720 Tobacco use: Secondary | ICD-10-CM

## 2017-09-03 NOTE — Telephone Encounter (Signed)
Pt informed of CT results per Sarah Groce, NP.  PT verbalized understanding.  Copy sent to PCP.  Order placed for 1 yr f/u CT.  

## 2017-09-16 ENCOUNTER — Encounter (HOSPITAL_COMMUNITY): Payer: Self-pay | Admitting: Emergency Medicine

## 2017-09-16 ENCOUNTER — Inpatient Hospital Stay (HOSPITAL_COMMUNITY)
Admission: EM | Admit: 2017-09-16 | Discharge: 2017-09-18 | DRG: 190 | Disposition: A | Discharge status: Home / Self Care | Payer: PPO | Attending: Pulmonary Disease | Admitting: Pulmonary Disease

## 2017-09-16 ENCOUNTER — Other Ambulatory Visit: Payer: Self-pay

## 2017-09-16 ENCOUNTER — Emergency Department (HOSPITAL_COMMUNITY): Payer: PPO

## 2017-09-16 DIAGNOSIS — R9431 Abnormal electrocardiogram [ECG] [EKG]: Secondary | ICD-10-CM | POA: Diagnosis not present

## 2017-09-16 DIAGNOSIS — J9601 Acute respiratory failure with hypoxia: Secondary | ICD-10-CM | POA: Diagnosis present

## 2017-09-16 DIAGNOSIS — K219 Gastro-esophageal reflux disease without esophagitis: Secondary | ICD-10-CM | POA: Diagnosis present

## 2017-09-16 DIAGNOSIS — Z79899 Other long term (current) drug therapy: Secondary | ICD-10-CM | POA: Diagnosis not present

## 2017-09-16 DIAGNOSIS — F17200 Nicotine dependence, unspecified, uncomplicated: Secondary | ICD-10-CM | POA: Diagnosis present

## 2017-09-16 DIAGNOSIS — E876 Hypokalemia: Secondary | ICD-10-CM | POA: Diagnosis present

## 2017-09-16 DIAGNOSIS — R0603 Acute respiratory distress: Secondary | ICD-10-CM | POA: Diagnosis not present

## 2017-09-16 DIAGNOSIS — I10 Essential (primary) hypertension: Secondary | ICD-10-CM | POA: Diagnosis present

## 2017-09-16 DIAGNOSIS — R739 Hyperglycemia, unspecified: Secondary | ICD-10-CM | POA: Diagnosis present

## 2017-09-16 DIAGNOSIS — Z23 Encounter for immunization: Secondary | ICD-10-CM | POA: Diagnosis present

## 2017-09-16 DIAGNOSIS — J441 Chronic obstructive pulmonary disease with (acute) exacerbation: Secondary | ICD-10-CM | POA: Diagnosis present

## 2017-09-16 DIAGNOSIS — F40240 Claustrophobia: Secondary | ICD-10-CM | POA: Diagnosis present

## 2017-09-16 DIAGNOSIS — F17210 Nicotine dependence, cigarettes, uncomplicated: Secondary | ICD-10-CM | POA: Diagnosis present

## 2017-09-16 DIAGNOSIS — M5030 Other cervical disc degeneration, unspecified cervical region: Secondary | ICD-10-CM | POA: Diagnosis present

## 2017-09-16 DIAGNOSIS — Z885 Allergy status to narcotic agent status: Secondary | ICD-10-CM

## 2017-09-16 DIAGNOSIS — R000 Tachycardia, unspecified: Secondary | ICD-10-CM | POA: Diagnosis not present

## 2017-09-16 DIAGNOSIS — J9600 Acute respiratory failure, unspecified whether with hypoxia or hypercapnia: Secondary | ICD-10-CM | POA: Diagnosis not present

## 2017-09-16 DIAGNOSIS — R0602 Shortness of breath: Secondary | ICD-10-CM | POA: Diagnosis present

## 2017-09-16 DIAGNOSIS — M5136 Other intervertebral disc degeneration, lumbar region: Secondary | ICD-10-CM | POA: Diagnosis present

## 2017-09-16 LAB — CBC WITH DIFFERENTIAL/PLATELET
BASOS ABS: 0 10*3/uL (ref 0.0–0.1)
Basophils Relative: 0 %
Eosinophils Absolute: 0.2 10*3/uL (ref 0.0–0.7)
Eosinophils Relative: 3 %
HEMATOCRIT: 47.3 % (ref 39.0–52.0)
HEMOGLOBIN: 15.4 g/dL (ref 13.0–17.0)
LYMPHS PCT: 21 %
Lymphs Abs: 1.9 10*3/uL (ref 0.7–4.0)
MCH: 31.2 pg (ref 26.0–34.0)
MCHC: 32.6 g/dL (ref 30.0–36.0)
MCV: 95.7 fL (ref 78.0–100.0)
MONO ABS: 1.2 10*3/uL — AB (ref 0.1–1.0)
Monocytes Relative: 13 %
NEUTROS ABS: 5.8 10*3/uL (ref 1.7–7.7)
Neutrophils Relative %: 63 %
Platelets: 180 10*3/uL (ref 150–400)
RBC: 4.94 MIL/uL (ref 4.22–5.81)
RDW: 13.1 % (ref 11.5–15.5)
WBC: 9.2 10*3/uL (ref 4.0–10.5)

## 2017-09-16 MED ORDER — MAGNESIUM SULFATE 2 GM/50ML IV SOLN
2.0000 g | Freq: Once | INTRAVENOUS | Status: AC
  Administered 2017-09-16: 2 g via INTRAVENOUS
  Filled 2017-09-16: qty 50

## 2017-09-16 MED ORDER — IPRATROPIUM BROMIDE 0.02 % IN SOLN
0.5000 mg | Freq: Once | RESPIRATORY_TRACT | Status: AC
  Administered 2017-09-16: 1 mg via RESPIRATORY_TRACT
  Filled 2017-09-16: qty 2.5

## 2017-09-16 MED ORDER — METHYLPREDNISOLONE SODIUM SUCC 125 MG IJ SOLR
125.0000 mg | Freq: Once | INTRAMUSCULAR | Status: AC
  Administered 2017-09-16: 125 mg via INTRAVENOUS
  Filled 2017-09-16: qty 2

## 2017-09-16 MED ORDER — ALBUTEROL (5 MG/ML) CONTINUOUS INHALATION SOLN: 15.0000 mg/h | INHALATION_SOLUTION | Freq: Once | RESPIRATORY_TRACT | Status: DC

## 2017-09-16 MED ORDER — LEVALBUTEROL HCL 1.25 MG/0.5ML IN NEBU
INHALATION_SOLUTION | RESPIRATORY_TRACT | Status: AC
Start: 2017-09-16 — End: 2017-09-17
  Filled 2017-09-16: qty 0.5

## 2017-09-16 MED ORDER — LEVALBUTEROL HCL 1.25 MG/0.5ML IN NEBU
INHALATION_SOLUTION | RESPIRATORY_TRACT | Status: AC
  Administered 2017-09-16: 1.25 mg
  Filled 2017-09-16: qty 0.5

## 2017-09-16 MED ORDER — IPRATROPIUM BROMIDE 0.02 % IN SOLN
RESPIRATORY_TRACT | Status: AC
Start: 2017-09-16 — End: 2017-09-17
  Administered 2017-09-17: 0.5 mg
  Filled 2017-09-16: qty 5

## 2017-09-16 NOTE — ED Triage Notes (Signed)
Pt states that he woke up sob at home, had low oxygen levels at home per pt, pt unable to speak in full sentences due to sob, resp at bedside upon pt arrival to er.

## 2017-09-17 ENCOUNTER — Inpatient Hospital Stay (HOSPITAL_COMMUNITY): Payer: PPO

## 2017-09-17 ENCOUNTER — Encounter (HOSPITAL_COMMUNITY): Payer: Self-pay | Admitting: Internal Medicine

## 2017-09-17 DIAGNOSIS — F40240 Claustrophobia: Secondary | ICD-10-CM | POA: Diagnosis present

## 2017-09-17 DIAGNOSIS — F17210 Nicotine dependence, cigarettes, uncomplicated: Secondary | ICD-10-CM | POA: Diagnosis present

## 2017-09-17 DIAGNOSIS — Z23 Encounter for immunization: Secondary | ICD-10-CM | POA: Diagnosis present

## 2017-09-17 DIAGNOSIS — M5136 Other intervertebral disc degeneration, lumbar region: Secondary | ICD-10-CM | POA: Diagnosis present

## 2017-09-17 DIAGNOSIS — J9600 Acute respiratory failure, unspecified whether with hypoxia or hypercapnia: Secondary | ICD-10-CM | POA: Diagnosis not present

## 2017-09-17 DIAGNOSIS — I10 Essential (primary) hypertension: Secondary | ICD-10-CM | POA: Diagnosis present

## 2017-09-17 DIAGNOSIS — M5030 Other cervical disc degeneration, unspecified cervical region: Secondary | ICD-10-CM | POA: Diagnosis present

## 2017-09-17 DIAGNOSIS — J441 Chronic obstructive pulmonary disease with (acute) exacerbation: Secondary | ICD-10-CM | POA: Diagnosis present

## 2017-09-17 DIAGNOSIS — R9431 Abnormal electrocardiogram [ECG] [EKG]: Secondary | ICD-10-CM | POA: Diagnosis present

## 2017-09-17 DIAGNOSIS — R0602 Shortness of breath: Secondary | ICD-10-CM | POA: Diagnosis present

## 2017-09-17 DIAGNOSIS — E876 Hypokalemia: Secondary | ICD-10-CM | POA: Diagnosis present

## 2017-09-17 DIAGNOSIS — R739 Hyperglycemia, unspecified: Secondary | ICD-10-CM | POA: Diagnosis present

## 2017-09-17 DIAGNOSIS — K219 Gastro-esophageal reflux disease without esophagitis: Secondary | ICD-10-CM | POA: Diagnosis present

## 2017-09-17 DIAGNOSIS — Z79899 Other long term (current) drug therapy: Secondary | ICD-10-CM | POA: Diagnosis not present

## 2017-09-17 DIAGNOSIS — Z885 Allergy status to narcotic agent status: Secondary | ICD-10-CM | POA: Diagnosis not present

## 2017-09-17 DIAGNOSIS — J9601 Acute respiratory failure with hypoxia: Secondary | ICD-10-CM | POA: Diagnosis present

## 2017-09-17 LAB — COMPREHENSIVE METABOLIC PANEL
ALBUMIN: 4 g/dL (ref 3.5–5.0)
ALT: 18 U/L (ref 17–63)
ANION GAP: 12 (ref 5–15)
AST: 23 U/L (ref 15–41)
Alkaline Phosphatase: 53 U/L (ref 38–126)
BILIRUBIN TOTAL: 0.8 mg/dL (ref 0.3–1.2)
BUN: 21 mg/dL — ABNORMAL HIGH (ref 6–20)
CO2: 21 mmol/L — ABNORMAL LOW (ref 22–32)
Calcium: 9.5 mg/dL (ref 8.9–10.3)
Chloride: 108 mmol/L (ref 101–111)
Creatinine, Ser: 0.81 mg/dL (ref 0.61–1.24)
GFR calc Af Amer: >60 mL/min (ref >60)
GFR calc non Af Amer: >60 mL/min (ref >60)
Glucose, Bld: 144 mg/dL — ABNORMAL HIGH (ref 65–99)
POTASSIUM: 3.6 mmol/L (ref 3.5–5.1)
SODIUM: 141 mmol/L (ref 135–145)
Total Protein: 7 g/dL (ref 6.5–8.1)

## 2017-09-17 LAB — CBC WITH DIFFERENTIAL/PLATELET
BASOS PCT: 0 %
Basophils Absolute: 0 10*3/uL (ref 0.0–0.1)
EOS ABS: 0 10*3/uL (ref 0.0–0.7)
Eosinophils Relative: 0 %
HCT: 44.3 % (ref 39.0–52.0)
HEMOGLOBIN: 14.3 g/dL (ref 13.0–17.0)
Lymphocytes Relative: 12 %
Lymphs Abs: 0.7 10*3/uL (ref 0.7–4.0)
MCH: 30.8 pg (ref 26.0–34.0)
MCHC: 32.3 g/dL (ref 30.0–36.0)
MCV: 95.3 fL (ref 78.0–100.0)
MONO ABS: 0 10*3/uL — AB (ref 0.1–1.0)
MONOS PCT: 1 %
NEUTROS PCT: 87 %
Neutro Abs: 5.2 10*3/uL (ref 1.7–7.7)
Platelets: 166 10*3/uL (ref 150–400)
RBC: 4.65 MIL/uL (ref 4.22–5.81)
RDW: 13 % (ref 11.5–15.5)
WBC: 6 10*3/uL (ref 4.0–10.5)

## 2017-09-17 LAB — BASIC METABOLIC PANEL
Anion gap: 8 (ref 5–15)
BUN: 18 mg/dL (ref 6–20)
CALCIUM: 8.4 mg/dL — AB (ref 8.9–10.3)
CO2: 21 mmol/L — ABNORMAL LOW (ref 22–32)
CREATININE: 0.84 mg/dL (ref 0.61–1.24)
Chloride: 108 mmol/L (ref 101–111)
Glucose, Bld: 171 mg/dL — ABNORMAL HIGH (ref 65–99)
Potassium: 5.1 mmol/L (ref 3.5–5.1)
SODIUM: 137 mmol/L (ref 135–145)

## 2017-09-17 LAB — CBG MONITORING, ED
GLUCOSE-CAPILLARY: 149 mg/dL — AB (ref 65–99)
Glucose-Capillary: 142 mg/dL — ABNORMAL HIGH (ref 65–99)

## 2017-09-17 LAB — BRAIN NATRIURETIC PEPTIDE: B Natriuretic Peptide: 19 pg/mL (ref 0.0–100.0)

## 2017-09-17 LAB — HEMOGLOBIN A1C
Hgb A1c MFr Bld: 5.7 % — ABNORMAL HIGH (ref 4.8–5.6)
Mean Plasma Glucose: 116.89 mg/dL

## 2017-09-17 LAB — ECHOCARDIOGRAM COMPLETE: WEIGHTICAEL: 2960 [oz_av]

## 2017-09-17 LAB — MAGNESIUM: Magnesium: 1.8 mg/dL (ref 1.7–2.4)

## 2017-09-17 LAB — MRSA PCR SCREENING: MRSA by PCR: NEGATIVE

## 2017-09-17 LAB — PHOSPHORUS: Phosphorus: 2.2 mg/dL — ABNORMAL LOW (ref 2.5–4.6)

## 2017-09-17 LAB — TROPONIN I: Troponin I: <0.03 ng/mL (ref <0.03)

## 2017-09-17 LAB — GLUCOSE, CAPILLARY
Glucose-Capillary: 119 mg/dL — ABNORMAL HIGH (ref 65–99)
Glucose-Capillary: 203 mg/dL — ABNORMAL HIGH (ref 65–99)

## 2017-09-17 MED ORDER — ONDANSETRON HCL 4 MG/2ML IJ SOLN: 4.0000 mg | Freq: Four times a day (QID) | INTRAMUSCULAR | Status: DC | PRN

## 2017-09-17 MED ORDER — GUAIFENESIN ER 600 MG PO TB12
600.0000 mg | ORAL_TABLET | Freq: Two times a day (BID) | ORAL | Status: DC
  Administered 2017-09-17 – 2017-09-18 (×3): 600 mg via ORAL
  Filled 2017-09-17 (×7): qty 1

## 2017-09-17 MED ORDER — LORAZEPAM 2 MG/ML IJ SOLN
1.0000 mg | Freq: Four times a day (QID) | INTRAMUSCULAR | Status: DC | PRN
  Administered 2017-09-17: 1 mg via INTRAVENOUS
  Filled 2017-09-17: qty 1

## 2017-09-17 MED ORDER — METHYLPREDNISOLONE SODIUM SUCC 125 MG IJ SOLR
80.0000 mg | Freq: Four times a day (QID) | INTRAMUSCULAR | Status: AC
  Administered 2017-09-17 (×3): 80 mg via INTRAVENOUS
  Filled 2017-09-17 (×3): qty 2

## 2017-09-17 MED ORDER — SODIUM CHLORIDE 0.45 % IV SOLN
INTRAVENOUS | Status: DC
  Administered 2017-09-17 – 2017-09-18 (×2): via INTRAVENOUS

## 2017-09-17 MED ORDER — ACETAMINOPHEN 325 MG PO TABS: 650.0000 mg | ORAL_TABLET | Freq: Four times a day (QID) | ORAL | Status: DC | PRN

## 2017-09-17 MED ORDER — ALBUTEROL (5 MG/ML) CONTINUOUS INHALATION SOLN
10.0000 mg/h | INHALATION_SOLUTION | Freq: Once | RESPIRATORY_TRACT | Status: AC
Start: 2017-09-17 — End: 2017-09-17
  Administered 2017-09-17: 10 mg/h via RESPIRATORY_TRACT
  Filled 2017-09-17: qty 20

## 2017-09-17 MED ORDER — ACETAMINOPHEN 650 MG RE SUPP: 650.0000 mg | Freq: Four times a day (QID) | RECTAL | Status: DC | PRN

## 2017-09-17 MED ORDER — POTASSIUM CHLORIDE IN NACL 20-0.45 MEQ/L-% IV SOLN
INTRAVENOUS | Status: AC
  Administered 2017-09-17: 01:00:00 via INTRAVENOUS
  Filled 2017-09-17: qty 1000
  Filled 2017-09-17: qty 2000

## 2017-09-17 MED ORDER — ALBUTEROL SULFATE (2.5 MG/3ML) 0.083% IN NEBU
2.5000 mg | INHALATION_SOLUTION | Freq: Four times a day (QID) | RESPIRATORY_TRACT | Status: DC
  Administered 2017-09-17: 2.5 mg via RESPIRATORY_TRACT
  Filled 2017-09-17: qty 3

## 2017-09-17 MED ORDER — IPRATROPIUM BROMIDE 0.02 % IN SOLN
0.5000 mg | Freq: Four times a day (QID) | RESPIRATORY_TRACT | Status: DC
  Administered 2017-09-17: 0.5 mg via RESPIRATORY_TRACT
  Filled 2017-09-17: qty 2.5

## 2017-09-17 MED ORDER — INSULIN ASPART 100 UNIT/ML ~~LOC~~ SOLN
0.0000 [IU] | Freq: Three times a day (TID) | SUBCUTANEOUS | Status: DC
  Administered 2017-09-17 – 2017-09-18 (×3): 2 [IU] via SUBCUTANEOUS
  Filled 2017-09-17 (×2): qty 1

## 2017-09-17 MED ORDER — ONDANSETRON HCL 4 MG PO TABS: 4.0000 mg | ORAL_TABLET | Freq: Four times a day (QID) | ORAL | Status: DC | PRN

## 2017-09-17 MED ORDER — CHLORHEXIDINE GLUCONATE 0.12 % MT SOLN
15.0000 mL | Freq: Two times a day (BID) | OROMUCOSAL | Status: DC
  Administered 2017-09-17: 15 mL via OROMUCOSAL
  Filled 2017-09-17 (×2): qty 15

## 2017-09-17 MED ORDER — ORAL CARE MOUTH RINSE
15.0000 mL | Freq: Two times a day (BID) | OROMUCOSAL | Status: DC
  Administered 2017-09-17: 15 mL via OROMUCOSAL

## 2017-09-17 MED ORDER — KETOROLAC TROMETHAMINE 30 MG/ML IJ SOLN
30.0000 mg | Freq: Once | INTRAMUSCULAR | Status: AC
  Administered 2017-09-17: 30 mg via INTRAVENOUS
  Filled 2017-09-17: qty 1

## 2017-09-17 MED ORDER — ALBUTEROL SULFATE (2.5 MG/3ML) 0.083% IN NEBU: 2.5000 mg | INHALATION_SOLUTION | RESPIRATORY_TRACT | Status: DC | PRN

## 2017-09-17 MED ORDER — LEVOFLOXACIN IN D5W 500 MG/100ML IV SOLN
500.0000 mg | INTRAVENOUS | Status: DC
  Administered 2017-09-17 – 2017-09-18 (×2): 500 mg via INTRAVENOUS
  Filled 2017-09-17 (×2): qty 100

## 2017-09-17 MED ORDER — ENOXAPARIN SODIUM 40 MG/0.4ML ~~LOC~~ SOLN
40.0000 mg | SUBCUTANEOUS | Status: DC
  Administered 2017-09-17: 40 mg via SUBCUTANEOUS
  Filled 2017-09-17: qty 0.4

## 2017-09-17 MED ORDER — IPRATROPIUM BROMIDE 0.02 % IN SOLN
0.5000 mg | Freq: Once | RESPIRATORY_TRACT | Status: DC
  Filled 2017-09-17: qty 2.5

## 2017-09-17 MED ORDER — INFLUENZA VAC SPLIT QUAD 0.5 ML IM SUSY
0.5000 mL | PREFILLED_SYRINGE | INTRAMUSCULAR | Status: AC
  Administered 2017-09-18: 0.5 mL via INTRAMUSCULAR
  Filled 2017-09-17: qty 0.5

## 2017-09-17 MED ORDER — LORAZEPAM 2 MG/ML IJ SOLN
1.0000 mg | Freq: Once | INTRAMUSCULAR | Status: AC
  Administered 2017-09-17: 1 mg via INTRAVENOUS
  Filled 2017-09-17: qty 1

## 2017-09-17 MED ORDER — POTASSIUM CHLORIDE IN NACL 20-0.45 MEQ/L-% IV SOLN
INTRAVENOUS | Status: DC
  Administered 2017-09-17 (×2): via INTRAVENOUS
  Filled 2017-09-17 (×5): qty 1000

## 2017-09-17 MED ORDER — PANTOPRAZOLE SODIUM 40 MG PO TBEC
40.0000 mg | DELAYED_RELEASE_TABLET | Freq: Every day | ORAL | Status: DC
  Administered 2017-09-17 – 2017-09-18 (×2): 40 mg via ORAL
  Filled 2017-09-17 (×2): qty 1

## 2017-09-17 MED ORDER — IPRATROPIUM-ALBUTEROL 0.5-2.5 (3) MG/3ML IN SOLN
3.0000 mL | Freq: Four times a day (QID) | RESPIRATORY_TRACT | Status: DC
  Administered 2017-09-17 – 2017-09-18 (×2): 3 mL via RESPIRATORY_TRACT
  Filled 2017-09-17 (×3): qty 3

## 2017-09-17 NOTE — Progress Notes (Signed)
*  PRELIMINARY RESULTS* Echocardiogram 2D Echocardiogram has been performed.  [name] Carter, [name] 09/17/2017, 2:14 PM

## 2017-09-17 NOTE — Progress Notes (Signed)
This is an assumption of care note:  He is a 57 year old with severe COPD.  He has ongoing tobacco use.  He has been very short of breath for the last 2 weeks.  I had treated him for COPD exacerbation but he did not improved.  He had significant hypoxia at home and eventually came to the emergency department because of increasing shortness of breath.  He has been on and off BiPAP several times and has had to be intubated and placed on mechanical ventilation 6 times.  He says he feels a little better.  He has received Ativan so he is sleepy but he is tolerating BiPAP so far.  Exam: He is sleepy.  His chest shows rhonchi and wheezing bilaterally.  His heart is regular with no gallop.  His abdomen is soft with no masses.  No edema.  He has severe COPD exacerbation.  He is high risk to need to be intubated and placed on mechanical ventilation.  Continue steroids.  I am going to add antibiotics.  Continue inhaled bronchodilators.  Continue BiPAP as needed.

## 2017-09-17 NOTE — ED Notes (Signed)
[facility]

## 2017-09-17 NOTE — ED Provider Notes (Signed)
[facility] EMERGENCY DEPARTMENT Provider Note   CSN: [id number] Arrival date & time: 09/16/17  2259  Time seen 23:07 PM   History   Chief Complaint Chief Complaint  Patient presents with  . Respiratory Distress   Level 5 caveat for acute respiratory distress and urgent need for intervention  HPI [name] Carter is a 57 y.o. male.  HPI patient has a history of COPD and states he has been intubated 6 times in the past.  When he arrives he is in severe respiratory distress.  Patient keeps wanting to talk however I cannot understand what he saying.  He was placed on a BiPAP fairly quickly after his arrival.  He wants to complain of headache and chest pain.  PCP Sinda Du, MD   Past Medical History:  Diagnosis Date  . Chronic back pain   . COPD (chronic obstructive pulmonary disease) ([place])   . DDD (degenerative disc disease), cervical   . DDD (degenerative disc disease), lumbar   . HTN (hypertension)   . Neck pain   . Pneumonia     Patient Active Problem List   Diagnosis Date Noted  . Hyperglycemia 09/17/2017  . 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 GERD (gastroesophageal reflux disease) 05/20/2015  . Community acquired pneumonia 04/25/2015  . Abdominal pain, epigastric 02/12/2015  . LLQ pain 02/12/2015  . Gastritis and gastroduodenitis 02/12/2015  . Constipation 02/12/2015  . COPD exacerbation ([place]) 08/20/2014  . COPD with acute exacerbation ([place]) 06/27/2014  . Chronic pain 06/27/2014  . Acute respiratory failure with hypoxia ([place]) 06/13/2014  . CAP (community acquired pneumonia) 05/16/2014  . Chest pain   . Acute respiratory failure ([place]) 05/13/2014  . Tobacco dependence 05/13/2014    Past Surgical History:  Procedure Laterality Date  . BACK SURGERY     low back x4  . BILATERAL CARPAL TUNNEL RELEASE    . bilteral knee surgery    . COLONOSCOPY  07/2012   [place] MD: MAC. Random colon biopsies benign. Rectal polyp tubular adenoma.  .  ESOPHAGOGASTRODUODENOSCOPY  07/2012   [place] MD: MAC. Chronic gastritis and intestinal metaplasia, ?metaplastic atrophic gastritis, negative H.pylori, no celiac, duidnal mucosa with mild Brunner gland hyperplasia, nonspecific.   Marland Kitchen ULNAR NERVE TRANSPOSITION     ? by description, not clear       Home Medications    Prior to Admission medications   Medication Sig Start Date End Date Taking? Authorizing Provider  albuterol (PROVENTIL HFA;VENTOLIN HFA) 108 (90 BASE) MCG/ACT inhaler Inhale 2 puffs into the lungs every 4 (four) hours as needed for wheezing or shortness of breath. 08/21/14   Samuella Cota, MD  amoxicillin (AMOXIL) 500 MG capsule Take 500 mg by mouth 3 (three) times daily.    [provider]  dexlansoprazole (DEXILANT) 60 MG capsule Take 1 capsule (60 mg total) by mouth daily. 05/20/15   Mahala Menghini, PA-C  Ipratropium-Albuterol (COMBIVENT) 20-100 MCG/ACT AERS respimat Inhale 1 puff into the lungs 4 (four) times daily. 08/21/14   Samuella Cota, MD  Spacer/Aero-Holding Chambers (AEROCHAMBER PLUS WITH MASK) inhaler Use as instructed 05/05/14   Orlie Dakin, MD    Family History Family History  Problem Relation Age of Onset  . Heart attack Father   . Colon cancer Father        diagnosed in his 49s    Social History Social History   Tobacco Use  . Smoking status: Heavy Tobacco Smoker    Packs/day: 2.00    Years:  41.00    Pack years: 82.00    Types: Cigarettes    Start date: 54  . Smokeless tobacco: Never Used  . Tobacco comment: one pack daily  Substance Use Topics  . Alcohol use: No    Alcohol/week: 0.0 oz  . Drug use: No     Allergies   Vicodin [hydrocodone-acetaminophen]   Review of Systems Review of Systems  Unable to perform ROS: Severe respiratory distress     Physical Exam Updated Vital Signs BP (!) 146/96   Pulse 98   Temp 99.9 F (37.7 C) (Axillary)   Resp (!) 31   Wt 83.9 kg (185 lb)   SpO2 100%   BMI 27.32  kg/m   Vital signs normal except for tachypnea and low-grade fever   Physical Exam  Constitutional: He is oriented to person, place, and time. He appears well-developed and well-nourished. He appears distressed.  HENT:  Head: Normocephalic and atraumatic.  Right Ear: External ear normal.  Left Ear: External ear normal.  Nose: Nose normal.  Eyes: Conjunctivae and EOM are normal. Pupils are equal, round, and reactive to light.  Cardiovascular: Regular rhythm. Tachycardia present.  Pulmonary/Chest: Accessory muscle usage present. Tachypnea noted. He is in respiratory distress. He has decreased breath sounds. He has wheezes. He has rhonchi.  Musculoskeletal: Normal range of motion. He exhibits no edema or deformity.  Neurological: He is alert and oriented to person, place, and time.  Skin: Skin is warm and dry. No rash noted. No erythema. No pallor.  Psychiatric: His mood appears anxious. His speech is rapid and/or pressured. He is agitated.     ED Treatments / Results  Labs (all labs ordered are listed, but only abnormal results are displayed) Results for orders placed or performed during the hospital encounter of 09/16/17  Comprehensive metabolic panel  Result Value Ref Range   Sodium 141 135 - 145 mmol/L   Potassium 3.6 3.5 - 5.1 mmol/L   Chloride 108 101 - 111 mmol/L   CO2 21 (L) 22 - 32 mmol/L   Glucose, Bld 144 (H) 65 - 99 mg/dL   BUN 21 (H) 6 - 20 mg/dL   Creatinine, Ser 0.81 0.61 - 1.24 mg/dL   Calcium 9.5 8.9 - 10.3 mg/dL   Total Protein 7.0 6.5 - 8.1 g/dL   Albumin 4.0 3.5 - 5.0 g/dL   AST 23 15 - 41 U/L   ALT 18 17 - 63 U/L   Alkaline Phosphatase 53 38 - 126 U/L   Total Bilirubin 0.8 0.3 - 1.2 mg/dL   GFR calc non Af Amer >60 >60 mL/min   GFR calc Af Amer >60 >60 mL/min   Anion gap 12 5 - 15  CBC with Differential  Result Value Ref Range   WBC 9.2 4.0 - 10.5 K/uL   RBC 4.94 4.22 - 5.81 MIL/uL   Hemoglobin 15.4 13.0 - 17.0 g/dL   HCT 47.3 39.0 - 52.0 %   MCV  95.7 78.0 - 100.0 fL   MCH 31.2 26.0 - 34.0 pg   MCHC 32.6 30.0 - 36.0 g/dL   RDW 13.1 11.5 - 15.5 %   Platelets 180 150 - 400 K/uL   Neutrophils Relative % 63 %   Neutro Abs 5.8 1.7 - 7.7 K/uL   Lymphocytes Relative 21 %   Lymphs Abs 1.9 0.7 - 4.0 K/uL   Monocytes Relative 13 %   Monocytes Absolute 1.2 (H) 0.1 - 1.0 K/uL   Eosinophils Relative 3 %  Eosinophils Absolute 0.2 0.0 - 0.7 K/uL   Basophils Relative 0 %   Basophils Absolute 0.0 0.0 - 0.1 K/uL  Troponin I  Result Value Ref Range   Troponin I <0.03 <0.03 ng/mL  Brain natriuretic peptide  Result Value Ref Range   B Natriuretic Peptide 19.0 0.0 - 100.0 pg/mL    Laboratory interpretation all normal except hyperglycemia    EKG  EKG Interpretation  Date/Time:  Sunday September 16 2017 23:08:38 EDT Ventricular Rate:  115 PR Interval:    QRS Duration: 88 QT Interval:  313 QTC Calculation: 433 R Axis:   -82 Text Interpretation:  Sinus tachycardia Left anterior fascicular block Abnormal R-wave progression, late transition Minimal ST depression, lateral leads ST elevation, consider inferior injury No significant change since last tracing 25 Apr 2015 Confirmed by Rolland Porter [phone]) on 09/16/2017 11:13:43 PM       Radiology Dg Chest Portable 1 View  Result Date: 09/16/2017 CLINICAL DATA:  Shortness of breath. EXAM: PORTABLE CHEST 1 VIEW COMPARISON:  Radiograph 08/23/2017.  CT 08/29/2017 FINDINGS: Progressive peribronchial thickening from prior exam. Heart size upper normal. No confluent airspace disease. No pleural fluid or pneumothorax. No acute osseous abnormality is seen. IMPRESSION: Progressive peribronchial thickening from prior exam may be bronchial inflammation/acute bronchitis versus pulmonary edema. Electronically Signed   By: Jeb Levering M.D.   On: 09/16/2017 23:52    Ct Cervical Spine Wo Contrast  Result Date: 08/30/2017 CLINICAL DATA:  Cervicalgia with right-sided radicular symptoms . IMPRESSION: 1.  No  fracture or spondylolisthesis. 2. Multilevel arthropathy. Exit foraminal narrowing is most severe at C3-4 on the right, C4-5 on the right, and C6-7 bilaterally, slightly more severe on the left than on the right. There is impression on exiting nerve roots at these levels. 3.  No frank disc extrusion or stenosis evident. 4.  There is bilateral carotid artery calcification. Electronically Signed   By: Lowella Grip III M.D.   On: 08/30/2017 07:56    Ct Chest Lung Ca Screen Low Dose W/o Cm  Result Date: 08/30/2017 CLINICAL DATA:  57 year old male current smoker with 82 pack-year history of smoking. Lung cancer screening examination.IMPRESSION: 1. Lung-RADS 2S, benign appearance or behavior. Continue annual screening with low-dose chest CT without contrast in 12 months. 2. The "S" modifier above refers to potentially clinically significant non lung cancer related findings. Specifically, there is aortic atherosclerosis, in addition to left main and 2 vessel coronary artery disease. Please note that although the presence of coronary artery calcium documents the presence of coronary artery disease, the severity of this disease and any potential stenosis cannot be assessed on this non-gated CT examination. Assessment for potential risk factor modification, dietary therapy or pharmacologic therapy may be warranted, if clinically indicated. 3. Mild diffuse bronchial wall thickening with mild centrilobular and paraseptal emphysema; imaging findings suggestive of underlying COPD. 4. Cholelithiasis. Aortic Atherosclerosis (ICD10-I70.0) and Emphysema (ICD10-J43.9). Electronically Signed   By: Vinnie Langton M.D.   On: 08/30/2017 08:56    Procedures .Critical Care Performed by: Rolland Porter, MD Authorized by: Rolland Porter, MD   Critical care provider statement:    Critical care time (minutes):  45   Critical care was necessary to treat or prevent imminent or life-threatening deterioration of the following  conditions:  Respiratory failure   Critical care was time spent personally by me on the following activities:  Evaluation of patient's response to treatment, examination of patient, ordering and review of laboratory studies, ordering and review of radiographic  studies, pulse oximetry and re-evaluation of patient's condition   (including critical care time)  Medications Ordered in ED Medications  ipratropium (ATROVENT) 0.02 % nebulizer solution (  Canceled Entry 09/16/17 2334)  levalbuterol (XOPENEX) 1.25 MG/0.5ML nebulizer solution (  Canceled Entry 09/16/17 2334)  albuterol (PROVENTIL,VENTOLIN) solution continuous neb ( Nebulization Canceled Entry 09/16/17 2334)  albuterol (PROVENTIL,VENTOLIN) solution continuous neb (not administered)  ipratropium (ATROVENT) nebulizer solution 0.5 mg (not administered)  methylPREDNISolone sodium succinate (SOLU-MEDROL) 125 mg/2 mL injection 80 mg (not administered)  ipratropium (ATROVENT) nebulizer solution 0.5 mg (1 mg Nebulization Given 09/16/17 2335)  methylPREDNISolone sodium succinate (SOLU-MEDROL) 125 mg/2 mL injection 125 mg (125 mg Intravenous Given 09/16/17 2319)  magnesium sulfate IVPB 2 g 50 mL (0 g Intravenous Stopped 09/17/17 0028)  levalbuterol (XOPENEX) 1.25 MG/0.5ML nebulizer solution (1.25 mg  Given 09/16/17 2335)     Initial Impression / Assessment and Plan / ED Course  I have reviewed the triage vital signs and the nursing notes.  Pertinent labs & imaging results that were available during my care of the patient were reviewed by me and considered in my medical decision making (see chart for details).     Patient had been started on a Xopenex nebulizer at the time of my exam.  He was placed on BiPAP and then put on a continuous albuterol nebulizer with albuterol and Atrovent.  He was given IV Solu-Medrol and after reviewing his last blood work in October 2016 where he had a normal renal function he was given magnesium 2 g IV.  Patient's  initial respiratory rate was in the 40s.  Recheck at 2328 patient is on BiPAP.  His heart rate has improved to 109, respiratory rate 33, pulse ox 100%.  He is on the continuous nebulizer.  Patient still appears to have some anxiety and as I am talking to him in the room his respiratory rate did jump up to 40.  Recheck at 2355 patient's heart rate is 102, respiratory rate 30, pulse ox 100%, he seems to be much calmer now.  Recheck at 12:30 AM patient is laying almost flat on the stretcher and he is breathing  easily.  His heart rate is 87, respiratory rate is 23 with pulse ox 99%.  He remains on BiPAP.  He has finished his continuous nebulizer and the Xopenex nebulizer.  When I listen to him however he has diffuse wheezing.  I talked to the patient about admission.  Dr. Olevia Bowens, hospitalist is going to admit.    Final Clinical Impressions(s) / ED Diagnoses   Final diagnoses:  COPD exacerbation [facility])    Plan admission  Rolland Porter, MD, Barbette Or, MD 09/17/17 [phone]

## 2017-09-17 NOTE — H&P (Signed)
History and Physical    [name] Carter ZWC:[id number] DOB: October 17, 1960 DOA: 09/16/2017  PCP: Sinda Du, MD   Patient coming from: Home.  I have personally briefly reviewed patient's old medical records in [place]  Chief Complaint: Shortness of breath.  HPI: [name] Carter is a 57 y.o. male with medical history significant of chronic upper and lower back pain, COPD, cervical and lumbar degenerative disc disease, hypertension, history of community-acquired pneumonia who is coming to the emergency department due to dyspnea.    Per patient, he has been getting progressively worse dyspnea for the past 2 weeks associated with wheezing and fatigue.  He saw his PCP who gave him amoxicillin a few weeks ago.  He has been using his inhalers and bronchodilators without significant results at home.  Earlier this evening, he woke up very dyspneic home, he checked his O2 sat a number of times and he was decreasing the 80s and low 90s.  Complains of frontal headache, but no fever, chills, sore throat, hemoptysis, chest pain, palpitations, diaphoresis, nausea, emesis, abdominal pain, diarrhea, constipation, melena or hematochezia.  No dysuria, frequency, hematuria.  No polyuria, polydipsia or blurred vision.  Denies heat or cold intolerance.  No pruritus or skin rashes.  His family stated earlier to the ED staff that he smokes up to 3 packs of cigarettes a day and was chopping wood outside in his backyard with his supplemental oxygen recently.  He has had many hospitalizations for COPD exacerbation.  He has b been put on BiPAP ventilation in number of times and has been intubated 6 times.  ED Course: Initial vital signs temperature 37.7C (99.9 F, pulse 127, respirations 24, blood pressure 158/104 and O2 sat 100% oxygen on nasal cannula.  The patient was started on BiPAP ventilation.  He received 2 continuous albuterol neb treatments, first treatment was 15 mg and another one for 10 mg plus  ipratropium 1 mg total, Solu-Medrol 125 mg IVP and magnesium sulfate 2 g IVPB.  His CBC showed a white count of 9.2 with a normal differential, hemoglobin 15.4 g/dL and platelets 180.  Troponin was normal.  BNP was 19.09 pg/mL.  His CMP showed a sodium of 141, potassium 3.6, chloride 108 and CO2 of 21 mmol/L.  Calcium 9.5, glucose 144, BUN 21 and creatinine 0.81.  His LFTs were normal.    His chest radiograph show progressive peribronchial thickening when compared to prior exam this may be due to bronchial inflammation/acute bronchitis versus pulmonary edema.  Please see image and full radiology report for further detail.  Review of Systems: As per HPI otherwise 10 point review of systems negative.    Past Medical History:  Diagnosis Date  . Chronic back pain   . COPD (chronic obstructive pulmonary disease) ([place])   . DDD (degenerative disc disease), cervical   . DDD (degenerative disc disease), lumbar   . HTN (hypertension)   . Neck pain   . Pneumonia     Past Surgical History:  Procedure Laterality Date  . BACK SURGERY     low back x4  . BILATERAL CARPAL TUNNEL RELEASE    . bilteral knee surgery    . COLONOSCOPY  07/2012   [place] MD: MAC. Random colon biopsies benign. Rectal polyp tubular adenoma.  . ESOPHAGOGASTRODUODENOSCOPY  07/2012   [place] MD: MAC. Chronic gastritis and intestinal metaplasia, ?metaplastic atrophic gastritis, negative H.pylori, no celiac, duidnal mucosa with mild Brunner gland hyperplasia, nonspecific.   Marland Kitchen ULNAR NERVE TRANSPOSITION     ?  by description, not clear     reports that he has been smoking cigarettes.  He started smoking about 42 years ago. He has a 82.00 pack-year smoking history. he has never used smokeless tobacco. He reports that he does not drink alcohol or use drugs.  Allergies  Allergen Reactions  . Vicodin [Hydrocodone-Acetaminophen] Nausea And Vomiting and Other (See Comments)    Made patient feel funny, stomach pain    Family  History  Problem Relation Age of Onset  . Heart attack Father   . Colon cancer Father        diagnosed in his 16s    Prior to Admission medications   Medication Sig Start Date End Date Taking? Authorizing Provider  albuterol (PROVENTIL HFA;VENTOLIN HFA) 108 (90 BASE) MCG/ACT inhaler Inhale 2 puffs into the lungs every 4 (four) hours as needed for wheezing or shortness of breath. 08/21/14   Samuella Cota, MD  amoxicillin (AMOXIL) 500 MG capsule Take 500 mg by mouth 3 (three) times daily.    [provider]  dexlansoprazole (DEXILANT) 60 MG capsule Take 1 capsule (60 mg total) by mouth daily. 05/20/15   Mahala Menghini, PA-C  Ipratropium-Albuterol (COMBIVENT) 20-100 MCG/ACT AERS respimat Inhale 1 puff into the lungs 4 (four) times daily. 08/21/14   Samuella Cota, MD  Spacer/Aero-Holding Chambers (AEROCHAMBER PLUS WITH MASK) inhaler Use as instructed 05/05/14   Orlie Dakin, MD    Physical Exam: Vitals:   09/16/17 2315 09/16/17 2321 09/16/17 2330 09/17/17 0000  BP: (!) 164/147 (!) 158/104 (!) 161/114 (!) 146/96  Pulse: (!) 127 (!) 109 (!) 107 98  Resp: (!) 24 (!) 44 (!) 27 (!) 31  Temp: 99.9 F (37.7 C)     TempSrc: Axillary     SpO2: 100%  99% 100%  Weight:        Constitutional: Mildly uncomfortable with BiPAP mask on face, but otherwise in NAD. Eyes: PERRL, lids and conjunctivae normal ENMT: BiPAP mask on.  Mucous membranes are mildly dry. Posterior pharynx clear of any exudate or lesions. Neck: normal, supple, no masses, no thyromegaly Respiratory: Moderately decreased breath sounds with wheezing and rhonchi bilaterally, no crackles. No accessory muscle use.  Cardiovascular: Regular rate and rhythm, no murmurs / rubs / gallops. No extremity edema. 2+ pedal pulses. No carotid bruits.  Abdomen: Soft, no tenderness, no masses palpated. No hepatosplenomegaly. Bowel sounds positive.  Musculoskeletal: no clubbing / cyanosis. Good ROM, no contractures. Normal muscle  tone.  Skin: no rashes, lesions, ulcers on very limited dermatological examination. Neurologic: CN 2-12 grossly intact. Sensation intact, DTR normal. Strength 5/5 in all 4.  Psychiatric: Normal judgment and insight. Alert and oriented x 3.  Feels claustrophobic with the BiPAP mask.  Mildly anxious mood.    Labs on Admission: I have personally reviewed following labs and imaging studies  CBC: Recent Labs  Lab 09/16/17 2311  WBC 9.2  NEUTROABS 5.8  HGB 15.4  HCT 47.3  MCV 95.7  PLT 509   Basic Metabolic Panel: Recent Labs  Lab 09/16/17 2311  NA 141  K 3.6  CL 108  CO2 21*  GLUCOSE 144*  BUN 21*  CREATININE 0.81  CALCIUM 9.5   GFR: CrCl cannot be calculated (Unknown ideal weight.). Liver Function Tests: Recent Labs  Lab 09/16/17 2311  AST 23  ALT 18  ALKPHOS 53  BILITOT 0.8  PROT 7.0  ALBUMIN 4.0   No results for input(s): LIPASE, AMYLASE in the last 168 hours. No results  for input(s): AMMONIA in the last 168 hours. Coagulation Profile: No results for input(s): INR, PROTIME in the last 168 hours. Cardiac Enzymes: Recent Labs  Lab 09/16/17 2311  TROPONINI <0.03   BNP (last 3 results) No results for input(s): PROBNP in the last 8760 hours. HbA1C: No results for input(s): HGBA1C in the last 72 hours. CBG: No results for input(s): GLUCAP in the last 168 hours. Lipid Profile: No results for input(s): CHOL, HDL, LDLCALC, TRIG, CHOLHDL, LDLDIRECT in the last 72 hours. Thyroid Function Tests: No results for input(s): TSH, T4TOTAL, FREET4, T3FREE, THYROIDAB in the last 72 hours. Anemia Panel: No results for input(s): VITAMINB12, FOLATE, FERRITIN, TIBC, IRON, RETICCTPCT in the last 72 hours. Urine analysis:    Component Value Date/Time   COLORURINE YELLOW 04/25/2015 [place] 04/25/2015 1451   LABSPEC >1.030 (H) 04/25/2015 1451   PHURINE 6.0 04/25/2015 1451   GLUCOSEU NEGATIVE 04/25/2015 1451   HGBUR MODERATE (A) 04/25/2015 1451    BILIRUBINUR NEGATIVE 04/25/2015 1451   KETONESUR NEGATIVE 04/25/2015 1451   PROTEINUR TRACE (A) 04/25/2015 1451   UROBILINOGEN 0.2 04/25/2015 1451   NITRITE NEGATIVE 04/25/2015 1451   LEUKOCYTESUR NEGATIVE 04/25/2015 1451    Radiological Exams on Admission: Dg Chest Portable 1 View  Result Date: 09/16/2017 CLINICAL DATA:  Shortness of breath. EXAM: PORTABLE CHEST 1 VIEW COMPARISON:  Radiograph 08/23/2017.  CT 08/29/2017 FINDINGS: Progressive peribronchial thickening from prior exam. Heart size upper normal. No confluent airspace disease. No pleural fluid or pneumothorax. No acute osseous abnormality is seen. IMPRESSION: Progressive peribronchial thickening from prior exam may be bronchial inflammation/acute bronchitis versus pulmonary edema. Electronically Signed   By: Jeb Levering M.D.   On: 09/16/2017 23:52    EKG: Independently reviewed.  Vent. rate 115 BPM PR interval * ms QRS duration 88 ms QT/QTc 313/433 ms P-R-T axes 30 -82 81 Sinus tachycardia Left anterior fascicular block Abnormal R-wave progression, late transition Minimal ST depression, lateral leads ST elevation, consider inferior injury  Assessment/Plan Principal Problem:   Acute respiratory failure (HCC)   COPD with acute exacerbation (HCC) Admit to stepdown/inpatient. Continue supplemental oxygen. Continue BiPAP ventilation. Continue as scheduled and as needed bronchodilators. Continue Solu-Medrol 80 mg IVP every 6 hours. The patient agreed to keep the BiPAP mask on, but will give him lorazepam 1 mg IVP So he is able to rest and also to decrease anxiety/claustrophobia from the mask.  Active Problems:   Tobacco dependence Nicotine replacement therapy Nurse to provide tobacco cessation information.    GERD (gastroesophageal reflux disease) Protonix 40 mg p.o. daily.    Hyperglycemia Carbohydrate modified diet. Check hemoglobin A1c. CBG monitoring with RI sliding scale while on steroids.    DVT  prophylaxis: Lovenox SQ. Code Status: Full code. Family Communication:  Disposition Plan: Admit to ICU for BiPAP ventilation and COPD exacerbation treatment. Consults called:  Admission status: Inpatient/ICU.   Reubin Milan MD [facility] Pager [phone]  If 7PM-7AM, please contact night-coverage [email or web address] Password [facility]  09/17/2017, 12:47 AM

## 2017-09-18 DIAGNOSIS — E876 Hypokalemia: Secondary | ICD-10-CM | POA: Diagnosis present

## 2017-09-18 DIAGNOSIS — J441 Chronic obstructive pulmonary disease with (acute) exacerbation: Secondary | ICD-10-CM | POA: Diagnosis not present

## 2017-09-18 DIAGNOSIS — J9601 Acute respiratory failure with hypoxia: Secondary | ICD-10-CM | POA: Diagnosis not present

## 2017-09-18 LAB — CBC WITH DIFFERENTIAL/PLATELET
BASOS PCT: 0 %
Basophils Absolute: 0 10*3/uL (ref 0.0–0.1)
Eosinophils Absolute: 0 10*3/uL (ref 0.0–0.7)
Eosinophils Relative: 0 %
HEMATOCRIT: 41.6 % (ref 39.0–52.0)
HEMOGLOBIN: 13.4 g/dL (ref 13.0–17.0)
LYMPHS PCT: 6 %
Lymphs Abs: 0.9 10*3/uL (ref 0.7–4.0)
MCH: 30.8 pg (ref 26.0–34.0)
MCHC: 32.2 g/dL (ref 30.0–36.0)
MCV: 95.6 fL (ref 78.0–100.0)
MONO ABS: 0.7 10*3/uL (ref 0.1–1.0)
Monocytes Relative: 5 %
NEUTROS ABS: 12.7 10*3/uL — AB (ref 1.7–7.7)
Neutrophils Relative %: 89 %
Platelets: 169 10*3/uL (ref 150–400)
RBC: 4.35 MIL/uL (ref 4.22–5.81)
RDW: 12.9 % (ref 11.5–15.5)
WBC: 14.3 10*3/uL — AB (ref 4.0–10.5)

## 2017-09-18 LAB — BASIC METABOLIC PANEL
ANION GAP: 8 (ref 5–15)
BUN: 21 mg/dL — ABNORMAL HIGH (ref 6–20)
CALCIUM: 8.7 mg/dL — AB (ref 8.9–10.3)
CO2: 21 mmol/L — AB (ref 22–32)
Chloride: 108 mmol/L (ref 101–111)
Creatinine, Ser: 0.81 mg/dL (ref 0.61–1.24)
GFR calc non Af Amer: >60 mL/min (ref >60)
Glucose, Bld: 212 mg/dL — ABNORMAL HIGH (ref 65–99)
Potassium: 4.7 mmol/L (ref 3.5–5.1)
Sodium: 137 mmol/L (ref 135–145)

## 2017-09-18 LAB — GLUCOSE, CAPILLARY: Glucose-Capillary: 138 mg/dL — ABNORMAL HIGH (ref 65–99)

## 2017-09-18 MED ORDER — ALBUTEROL SULFATE (2.5 MG/3ML) 0.083% IN NEBU: 2.5000 mg | INHALATION_SOLUTION | Freq: Four times a day (QID) | RESPIRATORY_TRACT | 12 refills | Status: AC | PRN

## 2017-09-18 MED ORDER — FLUTICASONE-UMECLIDIN-VILANT 100-62.5-25 MCG/INH IN AEPB: 1.0000 | INHALATION_SPRAY | Freq: Every day | RESPIRATORY_TRACT | 12 refills | Status: DC

## 2017-09-18 MED ORDER — PREDNISONE 10 MG (21) PO TBPK
ORAL_TABLET | ORAL | 0 refills | Status: DC
Start: 2017-09-18 — End: 2017-11-02

## 2017-09-18 NOTE — Discharge Summary (Signed)
Physician Discharge Summary  Patient ID: [name] StallsLeo Carter MRN: [id number] DOB/AGE: March 10, 1961 57 y.o. Primary Care Physician:Dinora Hemm, Ramon DredgeEdward, MD Admit date: 09/16/2017 Discharge date: 09/18/2017    Discharge Diagnoses:   Principal Problem:   Acute respiratory failure [facility](HCC) Active Problems:   Tobacco dependence   COPD with acute exacerbation (HCC)   GERD (gastroesophageal reflux disease)   Hyperglycemia   Abnormal EKG   Hypokalemia   Allergies as of 09/18/2017      Reactions   Vicodin [hydrocodone-acetaminophen] Nausea And Vomiting, Other (See Comments)   Made patient feel funny, stomach pain      Medication List    STOP taking these medications   cephALEXin 500 MG capsule Commonly known as:  KEFLEX   predniSONE 10 MG tablet Commonly known as:  DELTASONE Replaced by:  predniSONE 10 MG (21) Tbpk tablet     TAKE these medications   albuterol 108 (90 Base) MCG/ACT inhaler Commonly known as:  PROVENTIL HFA;VENTOLIN HFA Inhale 2 puffs into the lungs every 4 (four) hours as needed for wheezing or shortness of breath. What changed:  Another medication with the same name was added. Make sure you understand how and when to take each.   albuterol (2.5 MG/3ML) 0.083% nebulizer solution Commonly known as:  PROVENTIL Take 3 mLs (2.5 mg total) by nebulization every 6 (six) hours as needed for wheezing or shortness of breath. What changed:  You were already taking a medication with the same name, and this prescription was added. Make sure you understand how and when to take each.   Fluticasone-Umeclidin-Vilant 100-62.5-25 MCG/INH Aepb Commonly known as:  TRELEGY ELLIPTA Inhale 1 puff into the lungs daily.   levofloxacin 500 MG tablet Commonly known as:  LEVAQUIN Take 500 mg by mouth daily.   predniSONE 10 MG (21) Tbpk tablet Commonly known as:  STERAPRED UNI-PAK 21 TAB 6x3d,5x3d,4x3dm3x3d,2x3d,1x3d Replaces:  predniSONE 10 MG tablet            Durable Medical Equipment   (From admission, onward)        Start     Ordered   09/18/17 0000  DME Nebulizer machine    Comments:  [facility] wants from [facility]  Question Answer Comment  Patient needs a nebulizer to treat with the following condition Obstructive chronic bronchitis without exacerbation (HCC)   Patient needs a nebulizer to treat with the following condition Obstructive chronic bronchitis with exacerbation (HCC)      09/18/17 0821      Discharged Condition: Improved    Consults: None  Significant Diagnostic Studies: Dg Chest 2 View  Result Date: 08/23/2017 CLINICAL DATA:  Chronic cough. EXAM: CHEST  2 VIEW COMPARISON:  September 19, 2016 FINDINGS: There is no edema or consolidation. The heart size and pulmonary vascularity are normal. No adenopathy. No bone lesions. IMPRESSION: No edema or consolidation.  Stable cardiac silhouette. Electronically Signed   By: Bretta BangWilliam  Woodruff III M.D.   On: 08/23/2017 08:55   Ct Cervical Spine Wo Contrast  Result Date: 08/30/2017 CLINICAL DATA:  Cervicalgia with right-sided radicular symptoms EXAM: CT CERVICAL SPINE WITHOUT CONTRAST TECHNIQUE: Multidetector CT imaging of the cervical spine was performed without intravenous contrast. Multiplanar CT image reconstructions were also generated. COMPARISON:  Postmyelogram cervical spine CT September 02, 2014; cervical MRI January 31, 2017 FINDINGS: Alignment: There is no appreciable spondylolisthesis. Skull base and vertebrae: Skull base and craniocervical junction regions appear normal. No evident fracture. No blastic or lytic bone lesions. Soft tissues and spinal canal: Prevertebral soft tissues  and predental space regions are normal. There are no paraspinous lesions. There is no evident cord or canal hematoma. Disc levels: There is severe disc space narrowing at C5-6, C6-7, and C7-T1. At C2-3, there is mild facet hypertrophy bilaterally. No nerve root edema or effacement. No disc extrusion or stenosis. At C3-4, there is  moderately severe facet osteoarthritic change on the right with impression on the exiting nerve root due to bony hypertrophy. There is milder facet osteoarthritic change on the left at this level. There is no appreciable nerve root edema or effacement on the left. No disc extrusion or stenosis. The degree of facet arthropathy on the right at C3-4 has progressed since prior CT examination. At C4-5, there is moderately severe facet osteoarthritic change on the right with impression on the exiting nerve root on the right at C4-5 due to bony hypertrophy. This appearance is similar to prior study. There is slight facet osteoarthritic change on the left. No disc extrusion or stenosis. At C5-6, there is mild facet osteoarthritic change bilaterally. There is mild exit foraminal narrowing bilaterally at C5-6 without appreciable nerve root edema or effacement. No disc extrusion or stenosis. At C6-7, there is relatively mild facet hypertrophy bilaterally. There is mild exit foraminal narrowing on the right at C6-7 without nerve root edema or effacement. No disc extrusion or stenosis. At C7-T1, there is moderate facet osteoarthritic change bilaterally. There is impression on at both exiting nerve roots due to bony hypertrophy at the exit foramina at C7-T1, slightly more severe on the left than on the right. Upper chest: Visualized upper lung zones are clear. Other: There is calcification in each carotid artery. IMPRESSION: 1.  No fracture or spondylolisthesis. 2. Multilevel arthropathy. Exit foraminal narrowing is most severe at C3-4 on the right, C4-5 on the right, and C6-7 bilaterally, slightly more severe on the left than on the right. There is impression on exiting nerve roots at these levels. 3.  No frank disc extrusion or stenosis evident. 4.  There is bilateral carotid artery calcification. Electronically Signed   By: Bretta Bang III M.D.   On: 08/30/2017 07:56   Dg Chest Portable 1 View  Result Date:  09/16/2017 CLINICAL DATA:  Shortness of breath. EXAM: PORTABLE CHEST 1 VIEW COMPARISON:  Radiograph 08/23/2017.  CT 08/29/2017 FINDINGS: Progressive peribronchial thickening from prior exam. Heart size upper normal. No confluent airspace disease. No pleural fluid or pneumothorax. No acute osseous abnormality is seen. IMPRESSION: Progressive peribronchial thickening from prior exam may be bronchial inflammation/acute bronchitis versus pulmonary edema. Electronically Signed   By: Rubye Oaks M.D.   On: 09/16/2017 23:52   Ct Chest Lung Ca Screen Low Dose W/o Cm  Result Date: 08/30/2017 CLINICAL DATA:  57 year old male current smoker with 82 pack-year history of smoking. Lung cancer screening examination. EXAM: CT CHEST WITHOUT CONTRAST TECHNIQUE: Multidetector CT imaging of the chest was performed following the standard protocol without IV contrast. COMPARISON:  Chest CT 05/09/2014. FINDINGS: Cardiovascular: Heart size is normal. There is no significant pericardial fluid, thickening or pericardial calcification. There is aortic atherosclerosis, as well as atherosclerosis of the great vessels of the mediastinum and the coronary arteries, including calcified atherosclerotic plaque in the left main, left anterior descending and left circumflex coronary arteries. Mediastinum/Nodes: No pathologically enlarged mediastinal or hilar lymph nodes. Please note that accurate exclusion of hilar adenopathy is limited on noncontrast CT scans. Esophagus is unremarkable in appearance. No axillary lymphadenopathy. Lungs/Pleura: Multiple small pulmonary nodules scattered throughout the lungs bilaterally, the  largest of which is a subpleural nodule associated with the left major fissure (axial image 142 of series 3) with a volume derived mean diameter of only 5.9 mm. No larger more suspicious appearing pulmonary nodules or masses are noted. No acute consolidative airspace disease. No pleural effusions. Mild diffuse bronchial wall  thickening with mild centrilobular and paraseptal emphysema. Upper Abdomen: Aortic atherosclerosis. Partially calcified gallstone in the gallbladder. Musculoskeletal: There are no aggressive appearing lytic or blastic lesions noted in the visualized portions of the skeleton. IMPRESSION: 1. Lung-RADS 2S, benign appearance or behavior. Continue annual screening with low-dose chest CT without contrast in 12 months. 2. The "S" modifier above refers to potentially clinically significant non lung cancer related findings. Specifically, there is aortic atherosclerosis, in addition to left main and 2 vessel coronary artery disease. Please note that although the presence of coronary artery calcium documents the presence of coronary artery disease, the severity of this disease and any potential stenosis cannot be assessed on this non-gated CT examination. Assessment for potential risk factor modification, dietary therapy or pharmacologic therapy may be warranted, if clinically indicated. 3. Mild diffuse bronchial wall thickening with mild centrilobular and paraseptal emphysema; imaging findings suggestive of underlying COPD. 4. Cholelithiasis. Aortic Atherosclerosis (ICD10-I70.0) and Emphysema (ICD10-J43.9). Electronically Signed   By: Trudie Reed M.D.   On: 08/30/2017 08:56    Lab Results: Basic Metabolic Panel: Recent Labs    09/16/17 2311 09/17/17 0758 09/18/17 0358  NA 141 137 137  K 3.6 5.1 4.7  CL 108 108 108  CO2 21* 21* 21*  GLUCOSE 144* 171* 212*  BUN 21* 18 21*  CREATININE 0.81 0.84 0.81  CALCIUM 9.5 8.4* 8.7*  MG 1.8  --   --   PHOS 2.2*  --   --    Liver Function Tests: Recent Labs    09/16/17 2311  AST 23  ALT 18  ALKPHOS 53  BILITOT 0.8  PROT 7.0  ALBUMIN 4.0     CBC: Recent Labs    09/17/17 0758 09/18/17 0358  WBC 6.0 14.3*  NEUTROABS 5.2 12.7*  HGB 14.3 13.4  HCT 44.3 41.6  MCV 95.3 95.6  PLT 166 169    Recent Results (from the past 240 hour(s))  MRSA PCR  Screening     Status: None   Collection Time: 09/17/17  3:20 PM  Result Value Ref Range Status   MRSA by PCR NEGATIVE NEGATIVE Final    Comment:        The GeneXpert MRSA Assay (FDA approved for NASAL specimens only), is one component of a comprehensive MRSA colonization surveillance program. It is not intended to diagnose MRSA infection nor to guide or monitor treatment for MRSA infections. Performed at [facility], [address], [place], [place] [address]      Hospital Course: This is a 57 year old with severe COPD who came to the emergency department with increasing shortness of breath cough and congestion.  He was found to be hypoxic and was placed on BiPAP.  He was anxious and claustrophobic so he required Ativan.  His potassium level was low and this was replaced.  He was treated for approximately 36 hours with inhaled bronchodilators IV steroids IV antibiotics and BiPAP for the first 12 hours or so and improved markedly.  He is still smoking was told that he needs to stop.  He had been on maintenance inhalers but is not taking any now.  He has not been using a nebulizer at home.  He is not on oxygen at home and by the time of discharge his O2 saturation within the mid 90s on room air.  He had some trouble with elevated blood sugar I think based on his acute illness and IV steroids and that will be further investigated as an outpatient  Discharge Exam: Blood pressure (!) 141/81, pulse 67, temperature (!) 97.3 F (36.3 C), temperature source Oral, resp. rate 17, height 5\' 9"  (1.753 m), weight 87.6 kg (193 lb 2 oz), SpO2 97 %. He is awake and alert.  Chest is clear.  Heart is regular.  Abdomen soft.  No edema  Disposition: Home we discussed home health services but he does not want that.  He will get a nebulizer and start on a maintenance inhaler  Discharge Instructions    DME Nebulizer machine   Complete by:  As directed    He wants from [facility]   Patient needs a  nebulizer to treat with the following condition:   Obstructive chronic bronchitis without exacerbation (HCC) Obstructive chronic bronchitis with exacerbation (HCC)          Signed: Jaylianna Tatlock L   09/18/2017, 8:49 AM

## 2017-09-18 NOTE — Care Management Note (Addendum)
Case Management Note  Patient Details  Name: [name] Carter MRN: [id number] Date of Birth: [birth year]     Expected Discharge Date:  09/18/17               Expected Discharge Plan:  Home/Self Care  In-House Referral:     Discharge planning Services  CM Consult  Post Acute Care Choice:  Durable Medical Equipment Choice offered to:  Patient  DME Arranged:  Nebulizer machine DME Agency:  [facility]  [facility]:     Status of Service:  Completed, signed off  If discussed at [employer] Meetings, dates discussed:    Additional Comments: Patient discharging home today. Neb machine order faxed to [facility] per patient request. [facility]bulizer script to [facility] per patient request. No other CM needs. All to be delivered to patient's home today.  [name] Carter, [name] OilerSharley Diane, RN 09/18/2017, 9:09 AM

## 2017-09-18 NOTE — Progress Notes (Signed)
Subjective: He says he feels much better and wants to go home.  He was admitted early yesterday morning with COPD exacerbation that initially required BiPAP.  He is now not even requiring oxygen.  1 of the reasons he wants to go home now is that his wife has had surgery on her right dominant hand and she is not able to get to her physical therapy appointments because she cannot drive.  She also is unable to manage her activities of daily living or help take care of their 2 young children in the home.  Objective: Vital signs in last 24 hours: Temp:  [97.3 F (36.3 C)-97.9 F (36.6 C)] 97.3 F (36.3 C) (03/19 0400) Pulse Rate:  [55-73] 67 (03/19 0600) Resp:  [16-24] 17 (03/19 0600) BP: (112-143)/(70-102) 141/81 (03/19 0600) SpO2:  [91 %-100 %] 97 % (03/19 0600) Weight:  [86.8 kg (191 lb 5.8 oz)-87.6 kg (193 lb 2 oz)] 87.6 kg (193 lb 2 oz) (03/19 0500) Weight change: 2.885 kg (6 lb 5.8 oz)    Intake/Output from previous day: 03/18 0701 - 03/19 0700 In: 2006.7 [P.O.:240; I.V.:1666.7; IV Piggyback:100] Out: 5325 [Urine:5325]  PHYSICAL EXAM General appearance: alert, cooperative and no distress Resp: clear to auscultation bilaterally Cardio: regular rate and rhythm, S1, S2 normal, no murmur, click, rub or gallop GI: soft, non-tender; bowel sounds normal; no masses,  no organomegaly Extremities: extremities normal, atraumatic, no cyanosis or edema  Lab Results:  Results for orders placed or performed during the hospital encounter of 09/16/17 (from the past 48 hour(s))  Brain natriuretic peptide     Status: None   Collection Time: 09/16/17 11:00 PM  Result Value Ref Range   B Natriuretic Peptide 19.0 0.0 - 100.0 pg/mL    Comment: Performed at [facility], [address] [place], [place] [address]  Comprehensive metabolic panel     Status: Abnormal   Collection Time: 09/16/17 11:11 PM  Result Value Ref Range   Sodium 141 135 - 145 mmol/L   Potassium 3.6 3.5 - 5.1 mmol/L   Chloride  108 101 - 111 mmol/L   CO2 21 (L) 22 - 32 mmol/L   Glucose, Bld 144 (H) 65 - 99 mg/dL   BUN 21 (H) 6 - 20 mg/dL   Creatinine, Ser 0.81 0.61 - 1.24 mg/dL   Calcium 9.5 8.9 - 10.3 mg/dL   Total Protein 7.0 6.5 - 8.1 g/dL   Albumin 4.0 3.5 - 5.0 g/dL   AST 23 15 - 41 U/L   ALT 18 17 - 63 U/L   Alkaline Phosphatase 53 38 - 126 U/L   Total Bilirubin 0.8 0.3 - 1.2 mg/dL   GFR calc non Af Amer >60 >60 mL/min   GFR calc Af Amer >60 >60 mL/min    Comment: (NOTE) The eGFR has been calculated using the CKD EPI equation. This calculation has not been validated in all clinical situations. eGFR's persistently <60 mL/min signify possible Chronic Kidney Disease.    Anion gap 12 5 - 15    Comment: Performed at [facility], [address] [place], [place] [address]  CBC with Differential     Status: Abnormal   Collection Time: 09/16/17 11:11 PM  Result Value Ref Range   WBC 9.2 4.0 - 10.5 K/uL   RBC 4.94 4.22 - 5.81 MIL/uL   Hemoglobin 15.4 13.0 - 17.0 g/dL   HCT 47.3 39.0 - 52.0 %   MCV 95.7 78.0 - 100.0 fL   MCH 31.2 26.0 -  34.0 pg   MCHC 32.6 30.0 - 36.0 g/dL   RDW 13.1 11.5 - 15.5 %   Platelets 180 150 - 400 K/uL   Neutrophils Relative % 63 %   Neutro Abs 5.8 1.7 - 7.7 K/uL   Lymphocytes Relative 21 %   Lymphs Abs 1.9 0.7 - 4.0 K/uL   Monocytes Relative 13 %   Monocytes Absolute 1.2 (H) 0.1 - 1.0 K/uL   Eosinophils Relative 3 %   Eosinophils Absolute 0.2 0.0 - 0.7 K/uL   Basophils Relative 0 %   Basophils Absolute 0.0 0.0 - 0.1 K/uL    Comment: Performed at [facility], [address], [place], [place] [address]  Troponin I     Status: None   Collection Time: 09/16/17 11:11 PM  Result Value Ref Range   Troponin I <0.03 <0.03 ng/mL    Comment: Performed at [facility], [address] [place], [place] [address]  Magnesium     Status: None   Collection Time: 09/16/17 11:11 PM  Result Value Ref Range   Magnesium 1.8 1.7 - 2.4 mg/dL    Comment: Performed at [facility],  [address], [place], [place] [address]  Phosphorus     Status: Abnormal   Collection Time: 09/16/17 11:11 PM  Result Value Ref Range   Phosphorus 2.2 (L) 2.5 - 4.6 mg/dL    Comment: Performed at [facility], [address] [place], [place] [address]  Hemoglobin A1c     Status: Abnormal   Collection Time: 09/16/17 11:11 PM  Result Value Ref Range   Hgb A1c MFr Bld 5.7 (H) 4.8 - 5.6 %    Comment: (NOTE) Pre diabetes:          5.7%-6.4% Diabetes:              >6.4% Glycemic control for   <7.0% adults with diabetes    Mean Plasma Glucose 116.89 mg/dL    Comment: Performed at [facility] [address], [place], [place] [address]  CBG monitoring, ED     Status: Abnormal   Collection Time: 09/17/17  7:50 AM  Result Value Ref Range   Glucose-Capillary 142 (H) 65 - 99 mg/dL  CBC with Differential/Platelet     Status: Abnormal   Collection Time: 09/17/17  7:58 AM  Result Value Ref Range   WBC 6.0 4.0 - 10.5 K/uL   RBC 4.65 4.22 - 5.81 MIL/uL   Hemoglobin 14.3 13.0 - 17.0 g/dL   HCT 44.3 39.0 - 52.0 %   MCV 95.3 78.0 - 100.0 fL   MCH 30.8 26.0 - 34.0 pg   MCHC 32.3 30.0 - 36.0 g/dL   RDW 13.0 11.5 - 15.5 %   Platelets 166 150 - 400 K/uL   Neutrophils Relative % 87 %   Neutro Abs 5.2 1.7 - 7.7 K/uL   Lymphocytes Relative 12 %   Lymphs Abs 0.7 0.7 - 4.0 K/uL   Monocytes Relative 1 %   Monocytes Absolute 0.0 (L) 0.1 - 1.0 K/uL   Eosinophils Relative 0 %   Eosinophils Absolute 0.0 0.0 - 0.7 K/uL   Basophils Relative 0 %   Basophils Absolute 0.0 0.0 - 0.1 K/uL    Comment: Performed at [facility] [address], [place], [place] [address]  Basic metabolic panel     Status: Abnormal   Collection Time: 09/17/17  7:58 AM  Result Value Ref Range   Sodium 137 135 - 145 mmol/L  Potassium 5.1 3.5 - 5.1 mmol/L    Comment: DELTA CHECK NOTED   Chloride 108 101 - 111 mmol/L   CO2 21 (L) 22 - 32 mmol/L   Glucose, Bld 171 (H) 65 - 99 mg/dL   BUN 18 6 - 20 mg/dL   Creatinine, Ser  0.84 0.61 - 1.24 mg/dL   Calcium 8.4 (L) 8.9 - 10.3 mg/dL   GFR calc non Af Amer >60 >60 mL/min   GFR calc Af Amer >60 >60 mL/min    Comment: (NOTE) The eGFR has been calculated using the CKD EPI equation. This calculation has not been validated in all clinical situations. eGFR's persistently <60 mL/min signify possible Chronic Kidney Disease.    Anion gap 8 5 - 15    Comment: Performed at [facility], [address], [place], [place] [address]  CBG monitoring, ED     Status: Abnormal   Collection Time: 09/17/17 12:26 PM  Result Value Ref Range   Glucose-Capillary 149 (H) 65 - 99 mg/dL  MRSA PCR Screening     Status: None   Collection Time: 09/17/17  3:20 PM  Result Value Ref Range   MRSA by PCR NEGATIVE NEGATIVE    Comment:        The GeneXpert MRSA Assay (FDA approved for NASAL specimens only), is one component of a comprehensive MRSA colonization surveillance program. It is not intended to diagnose MRSA infection nor to guide or monitor treatment for MRSA infections. Performed at [facility], [address], [place], [place] [address]   Glucose, capillary     Status: Abnormal   Collection Time: 09/17/17  5:17 PM  Result Value Ref Range   Glucose-Capillary 119 (H) 65 - 99 mg/dL  Glucose, capillary     Status: Abnormal   Collection Time: 09/17/17  9:28 PM  Result Value Ref Range   Glucose-Capillary 203 (H) 65 - 99 mg/dL  CBC with Differential/Platelet     Status: Abnormal   Collection Time: 09/18/17  3:58 AM  Result Value Ref Range   WBC 14.3 (H) 4.0 - 10.5 K/uL   RBC 4.35 4.22 - 5.81 MIL/uL   Hemoglobin 13.4 13.0 - 17.0 g/dL   HCT 41.6 39.0 - 52.0 %   MCV 95.6 78.0 - 100.0 fL   MCH 30.8 26.0 - 34.0 pg   MCHC 32.2 30.0 - 36.0 g/dL   RDW 12.9 11.5 - 15.5 %   Platelets 169 150 - 400 K/uL   Neutrophils Relative % 89 %   Neutro Abs 12.7 (H) 1.7 - 7.7 K/uL   Lymphocytes Relative 6 %   Lymphs Abs 0.9 0.7 - 4.0 K/uL   Monocytes Relative 5 %   Monocytes Absolute 0.7  0.1 - 1.0 K/uL   Eosinophils Relative 0 %   Eosinophils Absolute 0.0 0.0 - 0.7 K/uL   Basophils Relative 0 %   Basophils Absolute 0.0 0.0 - 0.1 K/uL    Comment: Performed at [facility], [address], [place], [place] [address]  Basic metabolic panel     Status: Abnormal   Collection Time: 09/18/17  3:58 AM  Result Value Ref Range   Sodium 137 135 - 145 mmol/L   Potassium 4.7 3.5 - 5.1 mmol/L   Chloride 108 101 - 111 mmol/L   CO2 21 (L) 22 - 32 mmol/L   Glucose, Bld 212 (H) 65 - 99 mg/dL   BUN 21 (H) 6 - 20 mg/dL   Creatinine, Ser 0.81 0.61 - 1.24 mg/dL  Calcium 8.7 (L) 8.9 - 10.3 mg/dL   GFR calc non Af Amer >60 >60 mL/min   GFR calc Af Amer >60 >60 mL/min    Comment: (NOTE) The eGFR has been calculated using the CKD EPI equation. This calculation has not been validated in all clinical situations. eGFR's persistently <60 mL/min signify possible Chronic Kidney Disease.    Anion gap 8 5 - 15    Comment: Performed at [facility], [address] [place], [place] [address]  Glucose, capillary     Status: Abnormal   Collection Time: 09/18/17  8:13 AM  Result Value Ref Range   Glucose-Capillary 138 (H) 65 - 99 mg/dL    ABGS No results for input(s): PHART, PO2ART, TCO2, HCO3 in the last 72 hours.  Invalid input(s): PCO2 CULTURES Recent Results (from the past 240 hour(s))  MRSA PCR Screening     Status: None   Collection Time: 09/17/17  3:20 PM  Result Value Ref Range Status   MRSA by PCR NEGATIVE NEGATIVE Final    Comment:        The GeneXpert MRSA Assay (FDA approved for NASAL specimens only), is one component of a comprehensive MRSA colonization surveillance program. It is not intended to diagnose MRSA infection nor to guide or monitor treatment for MRSA infections. Performed at [facility], [address] [place], [place] [address]    Studies/Results: Dg Chest Portable 1 View  Result Date: 09/16/2017 CLINICAL DATA:  Shortness of breath. EXAM: PORTABLE CHEST  1 VIEW COMPARISON:  Radiograph 08/23/2017.  CT 08/29/2017 FINDINGS: Progressive peribronchial thickening from prior exam. Heart size upper normal. No confluent airspace disease. No pleural fluid or pneumothorax. No acute osseous abnormality is seen. IMPRESSION: Progressive peribronchial thickening from prior exam may be bronchial inflammation/acute bronchitis versus pulmonary edema. Electronically Signed   By: Jeb Levering M.D.   On: 09/16/2017 23:52    Medications:  Prior to Admission:  Medications Prior to Admission  Medication Sig Dispense Refill Last Dose  . albuterol (PROVENTIL HFA;VENTOLIN HFA) 108 (90 BASE) MCG/ACT inhaler Inhale 2 puffs into the lungs every 4 (four) hours as needed for wheezing or shortness of breath. 1 Inhaler 1 09/17/2017 at Unknown time  . cephALEXin (KEFLEX) 500 MG capsule Take 500 mg by mouth 2 (two) times daily.   09/16/2017 at Unknown time  . levofloxacin (LEVAQUIN) 500 MG tablet Take 500 mg by mouth daily.   09/16/2017 at Unknown time  . predniSONE (DELTASONE) 10 MG tablet Take 10 mg by mouth daily with breakfast. Taper   09/16/2017 at Unknown time   Scheduled: . chlorhexidine  15 mL Mouth Rinse BID  . enoxaparin (LOVENOX) injection  40 mg Subcutaneous Q24H  . guaiFENesin  600 mg Oral BID  . Influenza vac split quadrivalent PF  0.5 mL Intramuscular Tomorrow-1000  . insulin aspart  0-15 Units Subcutaneous TID WC  . ipratropium-albuterol  3 mL Nebulization Q6H  . mouth rinse  15 mL Mouth Rinse q12n4p  . pantoprazole  40 mg Oral Daily   Continuous: . sodium chloride 125 mL/hr at 09/18/17 0038  . levofloxacin (LEVAQUIN) IV 500 mg (09/18/17 0829)   GNF:AOZHYQMVHQION **OR** acetaminophen, albuterol, LORazepam, ondansetron **OR** ondansetron (ZOFRAN) IV  Assesment: He was admitted with acute respiratory failure and COPD exacerbation.  He initially required BiPAP but now is much improved and not even using nasal oxygen.  He wants to go home.  We discussed that at  length and I think as long as he does not  smoke at home he can go safely. Principal Problem:   Acute respiratory failure (HCC) Active Problems:   Tobacco dependence   COPD with acute exacerbation (HCC)   GERD (gastroesophageal reflux disease)   Hyperglycemia   Abnormal EKG    Plan: Discharge home today    LOS: 1 day   Averi Cacioppo L 09/18/2017, 8:46 AM

## 2017-09-21 ENCOUNTER — Encounter: Payer: Self-pay | Admitting: Cardiology

## 2017-09-21 ENCOUNTER — Ambulatory Visit: Payer: PPO | Admitting: Cardiology

## 2017-09-21 VITALS — BP 118/75 | HR 61 | Ht 69.0 in | Wt 188.0 lb

## 2017-09-21 DIAGNOSIS — I25119 Atherosclerotic heart disease of native coronary artery with unspecified angina pectoris: Secondary | ICD-10-CM | POA: Diagnosis not present

## 2017-09-21 MED ORDER — ASPIRIN EC 81 MG PO TBEC: 81.0000 mg | DELAYED_RELEASE_TABLET | Freq: Every day | ORAL | 3 refills | Status: DC

## 2017-09-21 NOTE — Progress Notes (Signed)
Clinical Summary [name] Carter is a 57 y.o.male seen as new consult, referred by Dr Luan Pulling for chest pain and coronary atherosclerosis.   1. Chest pain - 08/2017 echo LVEF 60-5%, no WMAs - 08/2017 CT chest with aortic atherosclerosis, left main CAD.   - 2 months of chest pain. Pressure mid chest, sharp pain left arm. Numbness in face.  - 7/10 in severity. Mainly occurs with activity. +SOB +fatigue - pain lasts up to 1 hr, numbness can last hours at a time  CAD risk factors: +tobacco, father 28 MI, sister with heart troubles - cannot run on treadmill.     2. COPD - admission 08/2017 with exacerbation - ongoing SOB and cough.  - followed by Dr Luan Pulling    Past Medical History:  Diagnosis Date  . Chronic back pain   . COPD (chronic obstructive pulmonary disease) ([place])   . DDD (degenerative disc disease), cervical   . DDD (degenerative disc disease), lumbar   . HTN (hypertension)   . Neck pain   . Pneumonia      Allergies  Allergen Reactions  . Vicodin [Hydrocodone-Acetaminophen] Nausea And Vomiting and Other (See Comments)    Made patient feel funny, stomach pain     Current Outpatient Medications  Medication Sig Dispense Refill  . albuterol (PROVENTIL HFA;VENTOLIN HFA) 108 (90 BASE) MCG/ACT inhaler Inhale 2 puffs into the lungs every 4 (four) hours as needed for wheezing or shortness of breath. 1 Inhaler 1  . albuterol (PROVENTIL) (2.5 MG/3ML) 0.083% nebulizer solution Take 3 mLs (2.5 mg total) by nebulization every 6 (six) hours as needed for wheezing or shortness of breath. 75 mL 12  . Fluticasone-Umeclidin-Vilant (TRELEGY ELLIPTA) 100-62.5-25 MCG/INH AEPB Inhale 1 puff into the lungs daily. 1 each 12  . levofloxacin (LEVAQUIN) 500 MG tablet Take 500 mg by mouth daily.    . predniSONE (STERAPRED UNI-PAK 21 TAB) 10 MG (21) TBPK tablet 6x3d,5x3d,4x3dm3x3d,2x3d,1x3d 63 tablet 0   No current facility-administered medications for this visit.      Past Surgical  History:  Procedure Laterality Date  . BACK SURGERY     low back x4  . BILATERAL CARPAL TUNNEL RELEASE    . bilteral knee surgery    . COLONOSCOPY  07/2012   [place] MD: MAC. Random colon biopsies benign. Rectal polyp tubular adenoma.  . ESOPHAGOGASTRODUODENOSCOPY  07/2012   [place] MD: MAC. Chronic gastritis and intestinal metaplasia, ?metaplastic atrophic gastritis, negative H.pylori, no celiac, duidnal mucosa with mild Brunner gland hyperplasia, nonspecific.   [place] Kitchen ULNAR NERVE TRANSPOSITION     ? by description, not clear     Allergies  Allergen Reactions  . Vicodin [Hydrocodone-Acetaminophen] Nausea And Vomiting and Other (See Comments)    Made patient feel funny, stomach pain      Family History  Problem Relation Age of Onset  . Heart attack Father   . Colon cancer Father        diagnosed in his 5s     Social History [name] Carter reports that he has been smoking cigarettes.  He started smoking about 42 years ago. He has a 82.00 pack-year smoking history. He has never used smokeless tobacco. [name] Carter reports that he does not drink alcohol.   Review of Systems CONSTITUTIONAL: No weight loss, fever, chills, weakness or fatigue.  HEENT: Eyes: No visual loss, blurred vision, double vision or yellow sclerae.No hearing loss, sneezing, congestion, runny nose or sore throat.  SKIN: No rash or itching.  CARDIOVASCULAR: per  hpi RESPIRATORY: per hpi GASTROINTESTINAL: No anorexia, nausea, vomiting or diarrhea. No abdominal pain or blood.  GENITOURINARY: No burning on urination, no polyuria NEUROLOGICAL: No headache, dizziness, syncope, paralysis, ataxia, numbness or tingling in the extremities. No change in bowel or bladder control.  MUSCULOSKELETAL: No muscle, back pain, joint pain or stiffness.  LYMPHATICS: No enlarged nodes. No history of splenectomy.  PSYCHIATRIC: No history of depression or anxiety.  ENDOCRINOLOGIC: No reports of sweating, cold or heat intolerance.  No polyuria or polydipsia.  [place] Kitchen   Physical Examination Vitals:   09/21/17 0905 09/21/17 0909  BP: 122/72 118/75  Pulse: 61   SpO2: 94%    Vitals:   09/21/17 0905  Weight: 188 lb (85.3 kg)  Height: _0  (1.753 m)    Gen: resting comfortably, no acute distress HEENT: no scleral icterus, pupils equal round and reactive, no palptable cervical adenopathy,  CV Resp: Clear to auscultation bilaterally GI: abdomen is soft, non-tender, non-distended, normal bowel sounds, no hepatosplenomegaly MSK: extremities are warm, no edema.  Skin: warm, no rash Neuro:  no focal deficits Psych: appropriate affect   Diagnostic Studies  08/2017 echo Study Conclusions  - Left ventricle: The cavity size was normal. Wall thickness was   normal. Systolic function was normal. The estimated ejection   fraction was in the range of 60% to 65%. Left ventricular   diastolic function parameters were normal.   Assessment and Plan  1. CAD/Chest pain - noted CAD by recent CT scan, along with recent chest pain. - we will plan for coronary CTA to further evaluate his disease burden and possible obstructive disease - start ASA 40m daily. Request pcp labs, likely would benefit from statin.    F/u 6 weeks.       JArnoldo Lenis M.D.

## 2017-09-21 NOTE — Patient Instructions (Signed)
Medication Instructions:  Your physician has recommended you make the following change in your medication:  Start Aspirin 81 mg Daily    Labwork: NONE   Testing/Procedures: Non-Cardiac CT Angiography (CTA), is a special type of CT scan that uses a computer to produce multi-dimensional views of major blood vessels throughout the body. In CT angiography, a contrast material is injected through an IV to help visualize the blood vessels   Follow-Up: Your physician recommends that you schedule a follow-up appointment in: 6 Weeks    Any Other Special Instructions Will Be Listed Below (If Applicable).     If you need a refill on your cardiac medications before your next appointment, please call your pharmacy.  Thank you for choosing [facility]!

## 2017-09-24 ENCOUNTER — Encounter: Payer: Self-pay | Admitting: Cardiology

## 2017-10-03 ENCOUNTER — Ambulatory Visit (HOSPITAL_COMMUNITY)
Admission: RE | Admit: 2017-10-03 | Discharge: 2017-10-03 | Disposition: A | Discharge status: Home / Self Care | Payer: PPO | Source: Ambulatory Visit | Attending: Cardiology | Admitting: Cardiology

## 2017-10-03 DIAGNOSIS — R918 Other nonspecific abnormal finding of lung field: Secondary | ICD-10-CM | POA: Diagnosis not present

## 2017-10-03 DIAGNOSIS — R079 Chest pain, unspecified: Secondary | ICD-10-CM | POA: Diagnosis not present

## 2017-10-03 DIAGNOSIS — I700 Atherosclerosis of aorta: Secondary | ICD-10-CM | POA: Insufficient documentation

## 2017-10-03 DIAGNOSIS — I25119 Atherosclerotic heart disease of native coronary artery with unspecified angina pectoris: Secondary | ICD-10-CM | POA: Diagnosis not present

## 2017-10-03 MED ORDER — IOPAMIDOL (ISOVUE-370) INJECTION 76%
100.0000 mL | Freq: Once | INTRAVENOUS | Status: AC | PRN
  Administered 2017-10-03: 100 mL via INTRAVENOUS

## 2017-10-04 ENCOUNTER — Telehealth: Payer: Self-pay | Admitting: Cardiology

## 2017-10-04 NOTE — Progress Notes (Addendum)
I spoke with patient and explained the confusion regarding the ordered CT scan. Both a CTA chest and coronary CTA were ordered in error, intention was only for a coronary CTA. The chest CTA has been completed at [facility], [place]. I have talked with office manager Blain PaisNikki Attaway to exclude the billing for the CTA chest since it was ordered in error. Patient was understanding and open to [place]   Dina RichJonathan [place] MD

## 2017-10-09 DIAGNOSIS — J449 Chronic obstructive pulmonary disease, unspecified: Secondary | ICD-10-CM | POA: Diagnosis not present

## 2017-10-09 DIAGNOSIS — F419 Anxiety disorder, unspecified: Secondary | ICD-10-CM | POA: Diagnosis not present

## 2017-10-09 DIAGNOSIS — M4712 Other spondylosis with myelopathy, cervical region: Secondary | ICD-10-CM | POA: Diagnosis not present

## 2017-10-09 DIAGNOSIS — G5600 Carpal tunnel syndrome, unspecified upper limb: Secondary | ICD-10-CM | POA: Diagnosis not present

## 2017-10-29 ENCOUNTER — Ambulatory Visit (HOSPITAL_COMMUNITY)
Admission: RE | Admit: 2017-10-29 | Discharge: 2017-10-29 | Disposition: A | Discharge status: Home / Self Care | Payer: PPO | Source: Ambulatory Visit | Attending: Cardiology | Admitting: Cardiology

## 2017-10-29 DIAGNOSIS — I25119 Atherosclerotic heart disease of native coronary artery with unspecified angina pectoris: Secondary | ICD-10-CM

## 2017-10-29 DIAGNOSIS — R079 Chest pain, unspecified: Secondary | ICD-10-CM | POA: Diagnosis not present

## 2017-10-29 MED ORDER — IOPAMIDOL (ISOVUE-370) INJECTION 76%
INTRAVENOUS | Status: AC
  Filled 2017-10-29: qty 100

## 2017-10-29 MED ORDER — IOPAMIDOL (ISOVUE-370) INJECTION 76%
80.0000 mL | Freq: Once | INTRAVENOUS | Status: AC | PRN
  Administered 2017-10-29: 80 mL via INTRAVENOUS

## 2017-10-29 MED ORDER — METOPROLOL TARTRATE 5 MG/5ML IV SOLN
INTRAVENOUS | Status: AC
  Administered 2017-10-29: 5 mg
  Filled 2017-10-29: qty 5

## 2017-10-29 MED ORDER — NITROGLYCERIN 0.4 MG SL SUBL: 0.8000 mg | SUBLINGUAL_TABLET | SUBLINGUAL | Status: DC | PRN

## 2017-10-29 MED ORDER — METOPROLOL TARTRATE 5 MG/5ML IV SOLN: 5.0000 mg | INTRAVENOUS | Status: DC | PRN

## 2017-10-30 ENCOUNTER — Telehealth: Payer: Self-pay

## 2017-10-30 DIAGNOSIS — R079 Chest pain, unspecified: Secondary | ICD-10-CM | POA: Diagnosis not present

## 2017-10-30 NOTE — Telephone Encounter (Signed)
-----   Message from Antoine Poche, MD sent at 10/30/2017  1:56 PM EDT ----- Coronary CTA suggests possible blockage in one of the arteries of his heart, the findings are borderline. How are his symptoms doing, is he still having chest pain episodes?   Dominga Ferry MD

## 2017-10-30 NOTE — Telephone Encounter (Signed)
Called pt. No answer. Left message for pt to return call.  

## 2017-11-02 ENCOUNTER — Encounter: Payer: Self-pay | Admitting: Student

## 2017-11-02 ENCOUNTER — Ambulatory Visit (INDEPENDENT_AMBULATORY_CARE_PROVIDER_SITE_OTHER): Payer: PPO | Admitting: Student

## 2017-11-02 VITALS — BP 132/90 | HR 79 | Ht 69.0 in | Wt 184.0 lb

## 2017-11-02 DIAGNOSIS — Z720 Tobacco use: Secondary | ICD-10-CM

## 2017-11-02 DIAGNOSIS — I2510 Atherosclerotic heart disease of native coronary artery without angina pectoris: Secondary | ICD-10-CM | POA: Diagnosis not present

## 2017-11-02 DIAGNOSIS — Z01818 Encounter for other preprocedural examination: Secondary | ICD-10-CM | POA: Diagnosis not present

## 2017-11-02 DIAGNOSIS — I10 Essential (primary) hypertension: Secondary | ICD-10-CM | POA: Diagnosis not present

## 2017-11-02 DIAGNOSIS — R079 Chest pain, unspecified: Secondary | ICD-10-CM

## 2017-11-02 MED ORDER — METOPROLOL SUCCINATE ER 25 MG PO TB24: 12.5000 mg | ORAL_TABLET | Freq: Every day | ORAL | 3 refills | Status: DC

## 2017-11-02 MED ORDER — NITROGLYCERIN 0.4 MG SL SUBL: 0.4000 mg | SUBLINGUAL_TABLET | SUBLINGUAL | 3 refills | Status: DC | PRN

## 2017-11-02 NOTE — H&P (View-Only) (Signed)
Cardiology Office Note    Date:  11/02/2017   ID:  [name] Carter, DOB [birth year], MRN [id number]  PCP:  Sinda Du, MD  Cardiologist: Carlyle Dolly, MD    Chief Complaint  Patient presents with  . Follow-up    6 week visit; Abnormal Coronary CT    History of Present Illness:    [name] Carter is a 57 y.o. male with past medical history of HTN, COPD, and tobacco use who presents to the office today for 6-week follow-up.  He was last examined by Dr. Harl Bowie in 08/2017 for evaluation of chest discomfort which had been occurring for the past 2 months. Reported pain could last up to 2 hours and he would experience associated numbness in his face and pain down his left arm. A Coronary CT was recommended for further evaluation and this was performed on 10/29/2017 and showed a Coronary calcium score of 286 which is in the 90th percentile for age and sex matched controlled and diffuse calcified plaque with moderate but possibly severe stenosis in the proximal to mid LAD.  This was sent for CT FFR and showed significant stenosis along the mid-distal LAD with a catheterization recommended for further evaluation.   In talking with the patient today, he reports having episodes of chest discomfort since his last office visit. He describes this as a pressure which radiates down his left arm and occurs with activity. He does note occasional numbness in his face when these episodes occur. Symptoms resolve with rest or the use of ASA. He denies any repeat episodes over the past 2 weeks. No associated orthopnea, PND, lower extremity edema, or palpitations.  He does continue to smoke 2 packs per day.    Past Medical History:  Diagnosis Date  . Chronic back pain   . COPD (chronic obstructive pulmonary disease) ([place])   . DDD (degenerative disc disease), cervical   . DDD (degenerative disc disease), lumbar   . HTN (hypertension)   . Neck pain   . Pneumonia     Past Surgical History:  Procedure  Laterality Date  . BACK SURGERY     low back x4  . BILATERAL CARPAL TUNNEL RELEASE    . bilteral knee surgery    . COLONOSCOPY  07/2012   [place] MD: MAC. Random colon biopsies benign. Rectal polyp tubular adenoma.  . ESOPHAGOGASTRODUODENOSCOPY  07/2012   [place] MD: MAC. Chronic gastritis and intestinal metaplasia, ?metaplastic atrophic gastritis, negative H.pylori, no celiac, duidnal mucosa with mild Brunner gland hyperplasia, nonspecific.   Marland Kitchen ULNAR NERVE TRANSPOSITION     ? by description, not clear    Current Medications: Outpatient Medications Prior to Visit  Medication Sig Dispense Refill  . albuterol (PROVENTIL HFA;VENTOLIN HFA) 108 (90 BASE) MCG/ACT inhaler Inhale 2 puffs into the lungs every 4 (four) hours as needed for wheezing or shortness of breath. 1 Inhaler 1  . albuterol (PROVENTIL) (2.5 MG/3ML) 0.083% nebulizer solution Take 3 mLs (2.5 mg total) by nebulization every 6 (six) hours as needed for wheezing or shortness of breath. 75 mL 12  . aspirin EC 81 MG tablet Take 1 tablet (81 mg total) by mouth daily. 90 tablet 3  . Fluticasone-Umeclidin-Vilant (TRELEGY ELLIPTA) 100-62.5-25 MCG/INH AEPB Inhale 1 puff into the lungs daily. 1 each 12  . predniSONE (STERAPRED UNI-PAK 21 TAB) 10 MG (21) TBPK tablet 6x3d,5x3d,4x3dm3x3d,2x3d,1x3d 63 tablet 0  . levofloxacin (LEVAQUIN) 500 MG tablet Take 500 mg by mouth daily.     No facility-administered medications prior  to visit.      Allergies:   Vicodin [hydrocodone-acetaminophen]   Social History   Socioeconomic History  . Marital status: Married    Spouse name: Not on file  . Number of children: 4  . Years of education: Not on file  . Highest education level: Not on file  Occupational History  . Not on file  Social Needs  . Financial resource strain: Not on file  . Food insecurity:    Worry: Not on file    Inability: Not on file  . Transportation needs:    Medical: Not on file    Non-medical: Not on file  Tobacco  Use  . Smoking status: Heavy Tobacco Smoker    Packs/day: 2.00    Years: 41.00    Pack years: 82.00    Types: Cigarettes    Start date: 1977  . Smokeless tobacco: Never Used  . Tobacco comment: one pack daily  Substance and Sexual Activity  . Alcohol use: No    Alcohol/week: 0.0 oz  . Drug use: No  . Sexual activity: Yes    Birth control/protection: None  Lifestyle  . Physical activity:    Days per week: Not on file    Minutes per session: Not on file  . Stress: Not on file  Relationships  . Social connections:    Talks on phone: Not on file    Gets together: Not on file    Attends religious service: Not on file    Active member of club or organization: Not on file    Attends meetings of clubs or organizations: Not on file    Relationship status: Not on file  Other Topics Concern  . Not on file  Social History Narrative  . Not on file     Family History:  The patient's family history includes Colon cancer in his father; Heart attack in his father and mother.   Review of Systems:   Please see the history of present illness.     General:  No chills, fever, night sweats or weight changes.  Cardiovascular:  No edema, orthopnea, palpitations, paroxysmal nocturnal dyspnea. Positive for chest pain and dyspnea on exertion.  Dermatological: No rash, lesions/masses Respiratory: No cough, dyspnea Urologic: No hematuria, dysuria Abdominal:   No nausea, vomiting, diarrhea, bright red blood per rectum, melena, or hematemesis Neurologic:  No visual changes, wkns, changes in mental status. All other systems reviewed and are otherwise negative except as noted above.   Physical Exam:    VS:  BP 132/90   Pulse 79   Ht 5' 9" (1.753 m)   Wt 184 lb (83.5 kg)   SpO2 95%   BMI 27.17 kg/m    General: Well developed, well nourished Caucasian male appearing in no acute distress. Head: Normocephalic, atraumatic, sclera non-icteric, no xanthomas, nares are without discharge.  Neck: No  carotid bruits. JVD not elevated.  Lungs: Respirations regular and unlabored, mild expiratory wheeze along upper lung fields bilaterally. Heart: Regular rate and rhythm. No S3 or S4.  No murmur, no rubs, or gallops appreciated. Abdomen: Soft, non-tender, non-distended with normoactive bowel sounds. No hepatomegaly. No rebound/guarding. No obvious abdominal masses. Msk:  Strength and tone appear normal for age. No joint deformities or effusions. Extremities: No clubbing or cyanosis. No lower extremity edema.  Distal pedal pulses are 2+ bilaterally. Neuro: Alert and oriented X 3. Moves all extremities spontaneously. No focal deficits noted. Psych:  Responds to questions appropriately with a normal affect. Skin:   No rashes or lesions noted  Wt Readings from Last 3 Encounters:  11/02/17 184 lb (83.5 kg)  09/21/17 188 lb (85.3 kg)  09/18/17 193 lb 2 oz (87.6 kg)      Studies/Labs Reviewed:   EKG:  EKG is not ordered today. EKG from 09/16/2017 is reviewed which shows sinus tachycardia,  HR 115, with LAFB and nonspecific ST abnormalities along inferior and lateral leads which are similar to prior tracings.   Recent Labs: 09/16/2017: ALT 18; B Natriuretic Peptide 19.0; Magnesium 1.8 09/18/2017: BUN 21; Creatinine, Ser 0.81; Hemoglobin 13.4; Platelets 169; Potassium 4.7; Sodium 137   Lipid Panel    Component Value Date/Time   TRIG 79 04/25/2015 2208    Additional studies/ records that were reviewed today include:   Echocardiogram: 09/17/2017 Study Conclusions  - Left ventricle: The cavity size was normal. Wall thickness was   normal. Systolic function was normal. The estimated ejection   fraction was in the range of 60% to 65%. Left ventricular   diastolic function parameters were normal.  Coronary CT: 10/29/2017 IMPRESSION: 1. Coronary calcium score of 286. This was 34 percentile for age and sex matched control.  2. Normal coronary origin with right dominance.  3. Diffuse  calcified plaque. Moderate but possibly sever stenosis in the proximal to mid LAD, additional analysis with CT FFR will be submitted.   1. Left Main:  No significant stenosis. 2. LAD: Proximal: 0.93, mid: 0.88, distal: 0.78. 3. D1: 0.88. 4. LCX: Proximal LCX: 0.94, distal LCX: 0.79. 5. RCA: No significant stenosis.  IMPRESSION: 1. CT FFR analysis showed significant stenosis in the mid to distal LAD, a cardiac catheterization is recommended.  Assessment:    1. Chest pain, unspecified type   2. Coronary artery calcification seen on CT scan   3. Pre-op testing   4. Essential hypertension   5. Tobacco use      Plan:   In order of problems listed above:  1. Chest Pain/ Coronary Atherosclerosis by CT - Was recently evaluated for episodes of chest discomfort and a coronary CT was obtained which showed a  calcium score of 286 which is in the 90th percentile for age and sex matched controlled and diffuse calcified plaque with moderate but possibly severe stenosis in the proximal to mid LAD. CT FFR showed significant stenosis along the mid-distal LAD. - He reports continual episodes of chest discomfort since his last office visit which occurs a few times per week. Symptoms typically resolve with rest. He does experience associated pain down his left arm and numbness along his left jaw. With his Coronary CT results and continued symptoms, will plan for a cardiac catheterization next week for definitive evaluation. This was discussed with Dr. Harl Bowie who is in agreement.  The patient understands that risks include but are not limited to stroke (1 in 1000), death (1 in 30), kidney failure [usually temporary] (1 in 500), bleeding (1 in 200), allergic reaction [possibly serious] (1 in 200).  - He is currently only on ASA 81 mg daily. Will add Toprol-XL 12.5 mg daily. Lipids are followed by his PCP and he will need to be started on statin therapy if LDL is not less than 70. Will provide with an Rx  for sublingual nitroglycerin. He is aware that if he has to take more than 3 tablets and symptoms do not improve, then he should proceed directly to the ED for further evaluation.   2. HTN - BP is at 132/90 during today's visit.  He is not currently on any anti-hypertensive medications.  - will add Toprol-XL 12.11m daily.   3. Tobacco Use - He continues to smoke 2 packs per day.  Cessation was advised.   Medication Adjustments/Labs and Tests Ordered: Current medicines are reviewed at length with the patient today.  Concerns regarding medicines are outlined above.  Medication changes, Labs and Tests ordered today are listed in the Patient Instructions below. Patient Instructions  Medication Instructions:  Your physician has recommended you make the following change in your medication: Start Nitro for Chest Pain  Start Toprol XL 12.5 mg Daily    Labwork: Your physician recommends that you return for lab work today.    Testing/Procedures: Your physician has requested that you have a cardiac catheterization. Cardiac catheterization is used to diagnose and/or treat various heart conditions. Doctors may recommend this procedure for a number of different reasons. The most common reason is to evaluate chest pain. Chest pain can be a symptom of coronary artery disease (CAD), and cardiac catheterization can show whether plaque is narrowing or blocking your heart's arteries. This procedure is also used to evaluate the valves, as well as measure the blood flow and oxygen levels in different parts of your heart. For further information please visit [email or web address] Please follow instruction sheet, as given.  Follow-Up: Your physician recommends that you schedule a follow-up appointment in: 4 Weeks   Any Other Special Instructions Will Be Listed Below (If Applicable).   If you need a refill on your cardiac medications before your next appointment, please call your pharmacy.    [facility] [id number]: [phone]: 3[place] 11/02/2017  You are scheduled for a Cardiac Catheterization on Wednesday, May 8 with Dr. HDaneen Schick  1. [facility] [address] [place] [address]at 9:00 AM (two hours before your procedure to ensure your preparation). Free valet parking service is available.   Special note: Every effort is made to have your procedure done on time. Please understand that emergencies sometimes delay scheduled procedures.  2. Diet: Do not eat or drink anything after midnight prior to your procedure except sips of water to take medications.  3. Labs: None needed.  4. Medication instructions in preparation for your procedure:  On the morning of your procedure, take your Aspirin and any morning medicines NOT listed above.  You may use sips of water.  5. Plan for one night stay--bring personal belongings. 6. Bring a current list of your medications and current insurance cards. 7. You MUST have a responsible person to drive you home. 8. Someone MUST be with you the first 24 hours after you arrive home or your discharge will be delayed. 9. Please wear clothes that are easy to get on and off and wear slip-on shoes.  Thank you for allowing [place] care for you!   -- [place] [facility]     Signed, BErma Heritage PHershal Coria 11/02/2017 4:52 PM    CFox Park[address]RMeh[address] [place] [id number]: [phone]

## 2017-11-02 NOTE — Patient Instructions (Signed)
Medication Instructions:  Your physician has recommended you make the following change in your medication: Start Nitro for Chest Pain  Start Toprol XL 12.5 mg Daily    Labwork: Your physician recommends that you return for lab work today.    Testing/Procedures: Your physician has requested that you have a cardiac catheterization. Cardiac catheterization is used to diagnose and/or treat various heart conditions. Doctors may recommend this procedure for a number of different reasons. The most common reason is to evaluate chest pain. Chest pain can be a symptom of coronary artery disease (CAD), and cardiac catheterization can show whether plaque is narrowing or blocking your heart's arteries. This procedure is also used to evaluate the valves, as well as measure the blood flow and oxygen levels in different parts of your heart. For further information please visit [email or web address] Please follow instruction sheet, as given.    Follow-Up: Your physician recommends that you schedule a follow-up appointment in: 4 Weeks   Any Other Special Instructions Will Be Listed Below (If Applicable).     If you need a refill on your cardiac medications before your next appointment, please call your pharmacy.    [facility] [address] [place] [address] Dept: [phone] Loc: [phone]  Jodi Kappes  11/02/2017  You are scheduled for a Cardiac Catheterization on Wednesday, May 8 with Dr. Verdis Prime.  1. Please arrive at the [facility] (Main Entrance A) at [facility]: [address] [place], [place] [address] at 9:00 AM (two hours before your procedure to ensure your preparation). Free valet parking service is available.   Special note: Every effort is made to have your procedure done on time. Please understand that emergencies sometimes delay scheduled procedures.  2. Diet: Do not eat or drink  anything after midnight prior to your procedure except sips of water to take medications.  3. Labs: None needed.  4. Medication instructions in preparation for your procedure:  On the morning of your procedure, take your Aspirin and any morning medicines NOT listed above.  You may use sips of water.  5. Plan for one night stay--bring personal belongings. 6. Bring a current list of your medications and current insurance cards. 7. You MUST have a responsible person to drive you home. 8. Someone MUST be with you the first 24 hours after you arrive home or your discharge will be delayed. 9. Please wear clothes that are easy to get on and off and wear slip-on shoes.  Thank you for allowing [place] to care for you!   -- [place] [facility]

## 2017-11-02 NOTE — Progress Notes (Signed)
Cardiology Office Note    Date:  11/02/2017   ID:  [name] Carter, DOB [birth year], MRN [id number]  PCP:  [name] Du, MD  Cardiologist: [name] Dolly, MD    Chief Complaint  Patient presents with  . Follow-up    6 week visit; Abnormal Coronary CT    History of Present Illness:    [name] Carter is a 57 y.o. male with past medical history of HTN, COPD, and tobacco use who presents to the office today for 6-week follow-up.  He was last examined by Dr. Harl Carter in 08/2017 for evaluation of chest discomfort which had been occurring for the past 2 months. Reported pain could last up to 2 hours and he would experience associated numbness in his face and pain down his left arm. A Coronary CT was recommended for further evaluation and this was performed on 10/29/2017 and showed a Coronary calcium score of 286 which is in the 90th percentile for age and sex matched controlled and diffuse calcified plaque with moderate but possibly severe stenosis in the proximal to mid LAD.  This was sent for CT FFR and showed significant stenosis along the mid-distal LAD with a catheterization recommended for further evaluation.   In talking with the patient today, he reports having episodes of chest discomfort since his last office visit. He describes this as a pressure which radiates down his left arm and occurs with activity. He does note occasional numbness in his face when these episodes occur. Symptoms resolve with rest or the use of ASA. He denies any repeat episodes over the past 2 weeks. No associated orthopnea, PND, lower extremity edema, or palpitations.  He does continue to smoke 2 packs per day.    Past Medical History:  Diagnosis Date  . Chronic back pain   . COPD (chronic obstructive pulmonary disease) ([place])   . DDD (degenerative disc disease), cervical   . DDD (degenerative disc disease), lumbar   . HTN (hypertension)   . Neck pain   . Pneumonia     Past Surgical History:  Procedure  Laterality Date  . BACK SURGERY     low back x4  . BILATERAL CARPAL TUNNEL RELEASE    . bilteral knee surgery    . COLONOSCOPY  07/2012   [place] MD: MAC. Random colon biopsies benign. Rectal polyp tubular adenoma.  . ESOPHAGOGASTRODUODENOSCOPY  07/2012   [place] MD: MAC. Chronic gastritis and intestinal metaplasia, ?metaplastic atrophic gastritis, negative H.pylori, no celiac, duidnal mucosa with mild Brunner gland hyperplasia, nonspecific.   Marland Kitchen ULNAR NERVE TRANSPOSITION     ? by description, not clear    Current Medications: Outpatient Medications Prior to Visit  Medication Sig Dispense Refill  . albuterol (PROVENTIL HFA;VENTOLIN HFA) 108 (90 BASE) MCG/ACT inhaler Inhale 2 puffs into the lungs every 4 (four) hours as needed for wheezing or shortness of breath. 1 Inhaler 1  . albuterol (PROVENTIL) (2.5 MG/3ML) 0.083% nebulizer solution Take 3 mLs (2.5 mg total) by nebulization every 6 (six) hours as needed for wheezing or shortness of breath. 75 mL 12  . aspirin EC 81 MG tablet Take 1 tablet (81 mg total) by mouth daily. 90 tablet 3  . Fluticasone-Umeclidin-Vilant (TRELEGY ELLIPTA) 100-62.5-25 MCG/INH AEPB Inhale 1 puff into the lungs daily. 1 each 12  . predniSONE (STERAPRED UNI-PAK 21 TAB) 10 MG (21) TBPK tablet 6x3d,5x3d,4x3dm3x3d,2x3d,1x3d 63 tablet 0  . levofloxacin (LEVAQUIN) 500 MG tablet Take 500 mg by mouth daily.     No facility-administered medications prior  to visit.      Allergies:   Vicodin [hydrocodone-acetaminophen]   Social History   Socioeconomic History  . Marital status: Married    Spouse name: Not on file  . Number of children: 4  . Years of education: Not on file  . Highest education level: Not on file  Occupational History  . Not on file  Social Needs  . Financial resource strain: Not on file  . Food insecurity:    Worry: Not on file    Inability: Not on file  . Transportation needs:    Medical: Not on file    Non-medical: Not on file  Tobacco  Use  . Smoking status: Heavy Tobacco Smoker    Packs/day: 2.00    Years: 41.00    Pack years: 82.00    Types: Cigarettes    Start date: 47  . Smokeless tobacco: Never Used  . Tobacco comment: one pack daily  Substance and Sexual Activity  . Alcohol use: No    Alcohol/week: 0.0 oz  . Drug use: No  . Sexual activity: Yes    Birth control/protection: None  Lifestyle  . Physical activity:    Days per week: Not on file    Minutes per session: Not on file  . Stress: Not on file  Relationships  . Social connections:    Talks on phone: Not on file    Gets together: Not on file    Attends religious service: Not on file    Active member of club or organization: Not on file    Attends meetings of clubs or organizations: Not on file    Relationship status: Not on file  Other Topics Concern  . Not on file  Social History Narrative  . Not on file     Family History:  The patient's family history includes Colon cancer in his father; Heart attack in his father and mother.   Review of Systems:   Please see the history of present illness.     General:  No chills, fever, night sweats or weight changes.  Cardiovascular:  No edema, orthopnea, palpitations, paroxysmal nocturnal dyspnea. Positive for chest pain and dyspnea on exertion.  Dermatological: No rash, lesions/masses Respiratory: No cough, dyspnea Urologic: No hematuria, dysuria Abdominal:   No nausea, vomiting, diarrhea, bright red blood per rectum, melena, or hematemesis Neurologic:  No visual changes, wkns, changes in mental status. All other systems reviewed and are otherwise negative except as noted above.   Physical Exam:    VS:  BP 132/90   Pulse 79   Ht _0  (1.753 m)   Wt 184 lb (83.5 kg)   SpO2 95%   BMI 27.17 kg/m    General: Well developed, well nourished Caucasian male appearing in no acute distress. Head: Normocephalic, atraumatic, sclera non-icteric, no xanthomas, nares are without discharge.  Neck: No  carotid bruits. JVD not elevated.  Lungs: Respirations regular and unlabored, mild expiratory wheeze along upper lung fields bilaterally. Heart: Regular rate and rhythm. No S3 or S4.  No murmur, no rubs, or gallops appreciated. Abdomen: Soft, non-tender, non-distended with normoactive bowel sounds. No hepatomegaly. No rebound/guarding. No obvious abdominal masses. Msk:  Strength and tone appear normal for age. No joint deformities or effusions. Extremities: No clubbing or cyanosis. No lower extremity edema.  Distal pedal pulses are 2+ bilaterally. Neuro: Alert and oriented X 3. Moves all extremities spontaneously. No focal deficits noted. Psych:  Responds to questions appropriately with a normal affect. Skin:  No rashes or lesions noted  Wt Readings from Last 3 Encounters:  11/02/17 184 lb (83.5 kg)  09/21/17 188 lb (85.3 kg)  09/18/17 193 lb 2 oz (87.6 kg)      Studies/Labs Reviewed:   EKG:  EKG is not ordered today. EKG from 09/16/2017 is reviewed which shows sinus tachycardia,  HR 115, with LAFB and nonspecific ST abnormalities along inferior and lateral leads which are similar to prior tracings.   Recent Labs: 09/16/2017: ALT 18; B Natriuretic Peptide 19.0; Magnesium 1.8 09/18/2017: BUN 21; Creatinine, Ser 0.81; Hemoglobin 13.4; Platelets 169; Potassium 4.7; Sodium 137   Lipid Panel    Component Value Date/Time   TRIG 79 04/25/2015 2208    Additional studies/ records that were reviewed today include:   Echocardiogram: 09/17/2017 Study Conclusions  - Left ventricle: The cavity size was normal. Wall thickness was   normal. Systolic function was normal. The estimated ejection   fraction was in the range of 60% to 65%. Left ventricular   diastolic function parameters were normal.  Coronary CT: 10/29/2017 IMPRESSION: 1. Coronary calcium score of 286. This was 34 percentile for age and sex matched control.  2. Normal coronary origin with right dominance.  3. Diffuse  calcified plaque. Moderate but possibly sever stenosis in the proximal to mid LAD, additional analysis with CT FFR will be submitted.   1. Left Main:  No significant stenosis. 2. LAD: Proximal: 0.93, mid: 0.88, distal: 0.78. 3. D1: 0.88. 4. LCX: Proximal LCX: 0.94, distal LCX: 0.79. 5. RCA: No significant stenosis.  IMPRESSION: 1. CT FFR analysis showed significant stenosis in the mid to distal LAD, a cardiac catheterization is recommended.  Assessment:    1. Chest pain, unspecified type   2. Coronary artery calcification seen on CT scan   3. Pre-op testing   4. Essential hypertension   5. Tobacco use      Plan:   In order of problems listed above:  1. Chest Pain/ Coronary Atherosclerosis by CT - Was recently evaluated for episodes of chest discomfort and a coronary CT was obtained which showed a  calcium score of 286 which is in the 90th percentile for age and sex matched controlled and diffuse calcified plaque with moderate but possibly severe stenosis in the proximal to mid LAD. CT FFR showed significant stenosis along the mid-distal LAD. - He reports continual episodes of chest discomfort since his last office visit which occurs a few times per week. Symptoms typically resolve with rest. He does experience associated pain down his left arm and numbness along his left jaw. With his Coronary CT results and continued symptoms, will plan for a cardiac catheterization next week for definitive evaluation. This was discussed with Dr. Harl Carter who is in agreement.  The patient understands that risks include but are not limited to stroke (1 in 1000), death (1 in 30), kidney failure [usually temporary] (1 in 500), bleeding (1 in 200), allergic reaction [possibly serious] (1 in 200).  - He is currently only on ASA 81 mg daily. Will add Toprol-XL 12.5 mg daily. Lipids are followed by his PCP and he will need to be started on statin therapy if LDL is not less than 70. Will provide with an Rx  for sublingual nitroglycerin. He is aware that if he has to take more than 3 tablets and symptoms do not improve, then he should proceed directly to the ED for further evaluation.   2. HTN - BP is at 132/90 during today's visit.  He is not currently on any anti-hypertensive medications.  - will add Toprol-XL 12.11m daily.   3. Tobacco Use - He continues to smoke 2 packs per day.  Cessation was advised.   Medication Adjustments/Labs and Tests Ordered: Current medicines are reviewed at length with the patient today.  Concerns regarding medicines are outlined above.  Medication changes, Labs and Tests ordered today are listed in the Patient Instructions below. Patient Instructions  Medication Instructions:  Your physician has recommended you make the following change in your medication: Start Nitro for Chest Pain  Start Toprol XL 12.5 mg Daily    Labwork: Your physician recommends that you return for lab work today.    Testing/Procedures: Your physician has requested that you have a cardiac catheterization. Cardiac catheterization is used to diagnose and/or treat various heart conditions. Doctors may recommend this procedure for a number of different reasons. The most common reason is to evaluate chest pain. Chest pain can be a symptom of coronary artery disease (CAD), and cardiac catheterization can show whether plaque is narrowing or blocking your heart's arteries. This procedure is also used to evaluate the valves, as well as measure the blood flow and oxygen levels in different parts of your heart. For further information please visit [email or web address] Please follow instruction sheet, as given.  Follow-Up: Your physician recommends that you schedule a follow-up appointment in: 4 Weeks   Any Other Special Instructions Will Be Listed Below (If Applicable).   If you need a refill on your cardiac medications before your next appointment, please call your pharmacy.    [facility] [id number]: [phone]: 3[place] 11/02/2017  You are scheduled for a Cardiac Catheterization on Wednesday, May 8 with Dr. HDaneen Schick  1. [facility] [address] [place] [address]at 9:00 AM (two hours before your procedure to ensure your preparation). Free valet parking service is available.   Special note: Every effort is made to have your procedure done on time. Please understand that emergencies sometimes delay scheduled procedures.  2. Diet: Do not eat or drink anything after midnight prior to your procedure except sips of water to take medications.  3. Labs: None needed.  4. Medication instructions in preparation for your procedure:  On the morning of your procedure, take your Aspirin and any morning medicines NOT listed above.  You may use sips of water.  5. Plan for one night stay--bring personal belongings. 6. Bring a current list of your medications and current insurance cards. 7. You MUST have a responsible person to drive you home. 8. Someone MUST be with you the first 24 hours after you arrive home or your discharge will be delayed. 9. Please wear clothes that are easy to get on and off and wear slip-on shoes.  Thank you for allowing [facility] for you!   -- [facility]     Signed, BErma Heritage PHershal Coria 11/02/2017 4:52 PM    [address] [address] [place] [id number]: [phone]

## 2017-11-05 DIAGNOSIS — Z01818 Encounter for other preprocedural examination: Secondary | ICD-10-CM | POA: Diagnosis not present

## 2017-11-05 DIAGNOSIS — R079 Chest pain, unspecified: Secondary | ICD-10-CM | POA: Diagnosis not present

## 2017-11-05 LAB — CBC WITH DIFFERENTIAL/PLATELET
BASOS PCT: 0.4 %
Basophils Absolute: 28 cells/uL (ref 0–200)
EOS ABS: 200 {cells}/uL (ref 15–500)
EOS PCT: 2.9 %
HCT: 46 % (ref 38.5–50.0)
HEMOGLOBIN: 15.7 g/dL (ref 13.2–17.1)
Lymphs Abs: 1967 cells/uL (ref 850–3900)
MCH: 31.1 pg (ref 27.0–33.0)
MCHC: 34.1 g/dL (ref 32.0–36.0)
MCV: 91.1 fL (ref 80.0–100.0)
MONOS PCT: 10.7 %
MPV: 10.5 fL (ref 7.5–12.5)
NEUTROS ABS: 3968 {cells}/uL (ref 1500–7800)
Neutrophils Relative %: 57.5 %
Platelets: 193 10*3/uL (ref 140–400)
RBC: 5.05 10*6/uL (ref 4.20–5.80)
RDW: 12 % (ref 11.0–15.0)
Total Lymphocyte: 28.5 %
WBC mixed population: 738 cells/uL (ref 200–950)
WBC: 6.9 10*3/uL (ref 3.8–10.8)

## 2017-11-05 LAB — BASIC METABOLIC PANEL
BUN: 13 mg/dL (ref 7–25)
CALCIUM: 9.3 mg/dL (ref 8.6–10.3)
CO2: 30 mmol/L (ref 20–32)
Chloride: 103 mmol/L (ref 98–110)
Creat: 0.94 mg/dL (ref 0.70–1.33)
Glucose, Bld: 88 mg/dL (ref 65–99)
POTASSIUM: 4.2 mmol/L (ref 3.5–5.3)
Sodium: 139 mmol/L (ref 135–146)

## 2017-11-05 LAB — PROTIME-INR
INR: 1
PROTHROMBIN TIME: 10.5 s (ref 9.0–11.5)

## 2017-11-06 ENCOUNTER — Telehealth: Payer: Self-pay | Admitting: *Deleted

## 2017-11-06 NOTE — Telephone Encounter (Signed)
-----   Message from Ellsworth Lennox, [place] sent at 11/06/2017  8:55 AM EDT ----- Please let the patient know his pre-cardiac catheterization labs look stable. Hgb and platelets within normal limits. Kidney function and electrolytes stable. Continue with plans for cath. Thank you.

## 2017-11-06 NOTE — Telephone Encounter (Signed)
Called patient with test results. No answer. Left message to call back.  

## 2017-11-06 NOTE — Telephone Encounter (Signed)
Pt contacted pre-catheterization scheduled at [facility] for: Wednesday Nov 07, 2017 11:30 AM Verified arrival time and place: [facility] Main Entrance A at: 9 AM  No solid food after midnight prior to cath, clear liquids until 5 AM day of procedure. Verified allergies in Epic Verified no diabetes medications.  AM meds can be  taken pre-cath with sip of water including: ASA 81 mg  Confirmed patient has responsible person to drive home post procedure and observe patient for 24 hours: yes

## 2017-11-07 ENCOUNTER — Encounter (HOSPITAL_COMMUNITY): Payer: Self-pay

## 2017-11-07 ENCOUNTER — Ambulatory Visit (HOSPITAL_COMMUNITY)
Admission: RE | Admit: 2017-11-07 | Discharge: 2017-11-07 | Disposition: A | Discharge status: Home / Self Care | Payer: PPO | Source: Ambulatory Visit | Attending: Interventional Cardiology | Admitting: Interventional Cardiology

## 2017-11-07 ENCOUNTER — Ambulatory Visit (HOSPITAL_COMMUNITY)
Admission: RE | Disposition: A | Discharge status: Home / Self Care | Payer: Self-pay | Source: Ambulatory Visit | Attending: Interventional Cardiology

## 2017-11-07 DIAGNOSIS — R9431 Abnormal electrocardiogram [ECG] [EKG]: Secondary | ICD-10-CM

## 2017-11-07 DIAGNOSIS — F17210 Nicotine dependence, cigarettes, uncomplicated: Secondary | ICD-10-CM | POA: Insufficient documentation

## 2017-11-07 DIAGNOSIS — M5136 Other intervertebral disc degeneration, lumbar region: Secondary | ICD-10-CM | POA: Insufficient documentation

## 2017-11-07 DIAGNOSIS — R931 Abnormal findings on diagnostic imaging of heart and coronary circulation: Secondary | ICD-10-CM | POA: Insufficient documentation

## 2017-11-07 DIAGNOSIS — I2510 Atherosclerotic heart disease of native coronary artery without angina pectoris: Secondary | ICD-10-CM | POA: Diagnosis not present

## 2017-11-07 DIAGNOSIS — F17200 Nicotine dependence, unspecified, uncomplicated: Secondary | ICD-10-CM | POA: Diagnosis present

## 2017-11-07 DIAGNOSIS — I25119 Atherosclerotic heart disease of native coronary artery with unspecified angina pectoris: Secondary | ICD-10-CM | POA: Diagnosis not present

## 2017-11-07 DIAGNOSIS — Z79899 Other long term (current) drug therapy: Secondary | ICD-10-CM | POA: Insufficient documentation

## 2017-11-07 DIAGNOSIS — Z8249 Family history of ischemic heart disease and other diseases of the circulatory system: Secondary | ICD-10-CM | POA: Diagnosis not present

## 2017-11-07 DIAGNOSIS — Z885 Allergy status to narcotic agent status: Secondary | ICD-10-CM | POA: Diagnosis not present

## 2017-11-07 DIAGNOSIS — J449 Chronic obstructive pulmonary disease, unspecified: Secondary | ICD-10-CM | POA: Diagnosis not present

## 2017-11-07 DIAGNOSIS — I10 Essential (primary) hypertension: Secondary | ICD-10-CM | POA: Diagnosis not present

## 2017-11-07 DIAGNOSIS — M5030 Other cervical disc degeneration, unspecified cervical region: Secondary | ICD-10-CM | POA: Diagnosis not present

## 2017-11-07 DIAGNOSIS — Z9889 Other specified postprocedural states: Secondary | ICD-10-CM | POA: Diagnosis not present

## 2017-11-07 DIAGNOSIS — Z01818 Encounter for other preprocedural examination: Secondary | ICD-10-CM

## 2017-11-07 DIAGNOSIS — Z7982 Long term (current) use of aspirin: Secondary | ICD-10-CM | POA: Diagnosis not present

## 2017-11-07 DIAGNOSIS — R079 Chest pain, unspecified: Secondary | ICD-10-CM

## 2017-11-07 HISTORY — PX: LEFT HEART CATH AND CORONARY ANGIOGRAPHY: CATH118249

## 2017-11-07 SURGERY — LEFT HEART CATH AND CORONARY ANGIOGRAPHY
Anesthesia: LOCAL

## 2017-11-07 MED ORDER — VERAPAMIL HCL 2.5 MG/ML IV SOLN
INTRAVENOUS | Status: AC
  Filled 2017-11-07: qty 2

## 2017-11-07 MED ORDER — FENTANYL CITRATE (PF) 100 MCG/2ML IJ SOLN
INTRAMUSCULAR | Status: AC
  Filled 2017-11-07: qty 2

## 2017-11-07 MED ORDER — SODIUM CHLORIDE 0.9 % WEIGHT BASED INFUSION: 3.0000 mL/kg/h | INTRAVENOUS | Status: AC

## 2017-11-07 MED ORDER — FENTANYL CITRATE (PF) 100 MCG/2ML IJ SOLN
INTRAMUSCULAR | Status: DC | PRN
  Administered 2017-11-07: 50 ug via INTRAVENOUS

## 2017-11-07 MED ORDER — SODIUM CHLORIDE 0.9 % WEIGHT BASED INFUSION: 1.0000 mL/kg/h | INTRAVENOUS | Status: DC

## 2017-11-07 MED ORDER — HEPARIN (PORCINE) IN NACL 1000-0.9 UT/500ML-% IV SOLN
INTRAVENOUS | Status: AC
  Filled 2017-11-07: qty 1000

## 2017-11-07 MED ORDER — SODIUM CHLORIDE 0.9% FLUSH: 3.0000 mL | INTRAVENOUS | Status: DC | PRN

## 2017-11-07 MED ORDER — ONDANSETRON HCL 4 MG/2ML IJ SOLN: 4.0000 mg | Freq: Four times a day (QID) | INTRAMUSCULAR | Status: DC | PRN

## 2017-11-07 MED ORDER — IOHEXOL 350 MG/ML SOLN
INTRAVENOUS | Status: DC | PRN
  Administered 2017-11-07: 80 mL via INTRA_ARTERIAL

## 2017-11-07 MED ORDER — MIDAZOLAM HCL 2 MG/2ML IJ SOLN
INTRAMUSCULAR | Status: DC | PRN
  Administered 2017-11-07 (×2): 1 mg via INTRAVENOUS

## 2017-11-07 MED ORDER — ASPIRIN 81 MG PO CHEW: 81.0000 mg | CHEWABLE_TABLET | Freq: Every day | ORAL | Status: DC

## 2017-11-07 MED ORDER — SODIUM CHLORIDE 0.9 % IV SOLN: 250.0000 mL | INTRAVENOUS | Status: DC | PRN

## 2017-11-07 MED ORDER — HEPARIN (PORCINE) IN NACL 2-0.9 UNITS/ML
INTRAMUSCULAR | Status: AC | PRN
  Administered 2017-11-07 (×2): 500 mL

## 2017-11-07 MED ORDER — SODIUM CHLORIDE 0.9 % IV SOLN: INTRAVENOUS | Status: DC

## 2017-11-07 MED ORDER — SODIUM CHLORIDE 0.9% FLUSH: 3.0000 mL | Freq: Two times a day (BID) | INTRAVENOUS | Status: DC

## 2017-11-07 MED ORDER — LIDOCAINE HCL (PF) 1 % IJ SOLN
INTRAMUSCULAR | Status: DC | PRN
  Administered 2017-11-07: 2 mL

## 2017-11-07 MED ORDER — ASPIRIN 81 MG PO CHEW: 81.0000 mg | CHEWABLE_TABLET | ORAL | Status: DC

## 2017-11-07 MED ORDER — HEPARIN SODIUM (PORCINE) 1000 UNIT/ML IJ SOLN
INTRAMUSCULAR | Status: AC
  Filled 2017-11-07: qty 1

## 2017-11-07 MED ORDER — LIDOCAINE HCL (PF) 1 % IJ SOLN
INTRAMUSCULAR | Status: AC
  Filled 2017-11-07: qty 30

## 2017-11-07 MED ORDER — HEPARIN (PORCINE) IN NACL 2-0.9 UNIT/ML-% IJ SOLN
INTRAMUSCULAR | Status: DC | PRN
  Administered 2017-11-07: 10 mL via INTRA_ARTERIAL

## 2017-11-07 MED ORDER — MIDAZOLAM HCL 2 MG/2ML IJ SOLN
INTRAMUSCULAR | Status: AC
  Filled 2017-11-07: qty 2

## 2017-11-07 MED ORDER — HEPARIN SODIUM (PORCINE) 1000 UNIT/ML IJ SOLN
INTRAMUSCULAR | Status: DC | PRN
  Administered 2017-11-07: 4000 [IU] via INTRAVENOUS

## 2017-11-07 SURGICAL SUPPLY — 12 items
CATH INFINITI 5 FR JL3.5 (CATHETERS) ×2 IMPLANT
CATH INFINITI JR4 5F (CATHETERS) ×2 IMPLANT
COVER PRB 48X5XTLSCP FOLD TPE (BAG) ×1 IMPLANT
COVER PROBE 5X48 (BAG) ×1
DEVICE RAD COMP TR BAND LRG (VASCULAR PRODUCTS) ×2 IMPLANT
GLIDESHEATH SLEND A-KIT 6F 22G (SHEATH) ×2 IMPLANT
GUIDEWIRE INQWIRE 1.5J.035X260 (WIRE) ×1 IMPLANT
INQWIRE 1.5J .035X260CM (WIRE) ×2
KIT HEART LEFT (KITS) ×2 IMPLANT
PACK CARDIAC CATHETERIZATION (CUSTOM PROCEDURE TRAY) ×2 IMPLANT
TRANSDUCER W/STOPCOCK (MISCELLANEOUS) ×2 IMPLANT
TUBING CIL FLEX 10 FLL-RA (TUBING) ×2 IMPLANT

## 2017-11-07 NOTE — Discharge Instructions (Signed)

## 2017-11-07 NOTE — Interval H&P Note (Signed)
Cath Lab Visit (complete for each Cath Lab visit)  Clinical Evaluation Leading to the Procedure:   ACS: No.  Non-ACS:    Anginal Classification: CCS Carter  Anti-ischemic medical therapy: Minimal Therapy (1 class of medications)  Non-Invasive Test Results: High-risk stress test findings: cardiac mortality >3%/year  Prior CABG: No previous CABG      History and Physical Interval Note:  11/07/2017 12:26 PM  [name] Carter  has presented today for surgery, with the diagnosis of cp, abnormal CTA  The various methods of treatment have been discussed with the patient and family. After consideration of risks, benefits and other options for treatment, the patient has consented to  Procedure(s): LEFT HEART CATH AND CORONARY ANGIOGRAPHY (N/A) as a surgical intervention .  The patient's history has been reviewed, patient examined, no change in status, stable for surgery.  I have reviewed the patient's chart and labs.  Questions were answered to the patient's satisfaction.     [name] Carter

## 2017-11-08 ENCOUNTER — Encounter (HOSPITAL_COMMUNITY): Payer: Self-pay | Admitting: Interventional Cardiology

## 2017-11-08 MED FILL — Heparin Sod (Porcine)-NaCl IV Soln 1000 Unit/500ML-0.9%: INTRAVENOUS | Qty: 1000 | Status: AC

## 2017-11-11 ENCOUNTER — Encounter (HOSPITAL_COMMUNITY): Payer: Self-pay | Admitting: Interventional Cardiology

## 2017-11-18 DIAGNOSIS — J441 Chronic obstructive pulmonary disease with (acute) exacerbation: Secondary | ICD-10-CM | POA: Diagnosis not present

## 2017-11-29 ENCOUNTER — Ambulatory Visit: Payer: PPO | Admitting: Student

## 2017-11-29 NOTE — Progress Notes (Deleted)
Cardiology Office Note    Date:  11/29/2017   ID:  [name] Carter, DOB 1961-02-22, MRN [id number]  PCP:  Sinda Du, MD  Cardiologist: Carlyle Dolly, MD    No chief complaint on file.   History of Present Illness:    [name] Carter is a 57 y.o. male with past medical history of HTN, COPD, and tobacco use who presents to the office today for 4-week follow-up.  He was last examined by myself on 11/02/2017 as a follow-up from a recent coronary CT which showed a calcium score of 286 and moderate but possibly severe stenosis in the proximal to mid LAD.  At the time of his visit, he was still having episodes of chest discomfort which would radiate down his left arm and was with his exertion, therefore a cardiac catheterization was recommended for definitive evaluation. This was performed on 11/07/2017 and showed 20-30% LM stenosis with 30-40% mid-LAD, and 65% Ost 1st Diag stenosis. 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 In the absence of heart failure, therapy for the cameral fistula was not felt to be indicated and continued medical therapy for CAD was recommended. If he developed CHF symptoms or LV dysfunction in the future, could consider management of the fistula at that time.    Past Medical History:  Diagnosis Date  . Chronic back pain   . COPD (chronic obstructive pulmonary disease) ([place])   . DDD (degenerative disc disease), cervical   . DDD (degenerative disc disease), lumbar   . HTN (hypertension)   . Neck pain   . Pneumonia     Past Surgical History:  Procedure Laterality Date  . BACK SURGERY     low back x4  . BILATERAL CARPAL TUNNEL RELEASE    . bilteral knee surgery    . COLONOSCOPY  07/2012   [place] MD: MAC. Random colon biopsies benign. Rectal polyp tubular adenoma.  .  ESOPHAGOGASTRODUODENOSCOPY  07/2012   [place] MD: MAC. Chronic gastritis and intestinal metaplasia, ?metaplastic atrophic gastritis, negative H.pylori, no celiac, duidnal mucosa with mild Brunner gland hyperplasia, nonspecific.   Marland Kitchen LEFT HEART CATH AND CORONARY ANGIOGRAPHY N/A 11/07/2017   Procedure: LEFT HEART CATH AND CORONARY ANGIOGRAPHY;  Surgeon: Belva Crome, MD;  Location: [facility];  Service: Cardiovascular;  Laterality: N/A;  . ULNAR NERVE TRANSPOSITION     ? by description, not clear    Current Medications: Outpatient Medications Prior to Visit  Medication Sig Dispense Refill  . albuterol (PROVENTIL HFA;VENTOLIN HFA) 108 (90 BASE) MCG/ACT inhaler Inhale 2 puffs into the lungs every 4 (four) hours as needed for wheezing or shortness of breath. 1 Inhaler 1  . albuterol (PROVENTIL) (2.5 MG/3ML) 0.083% nebulizer solution Take 3 mLs (2.5 mg total) by nebulization every 6 (six) hours as needed for wheezing or shortness of breath. 75 mL 12  . aspirin EC 81 MG tablet Take 1 tablet (81 mg total) by mouth daily. 90 tablet 3  . metoprolol succinate (TOPROL XL) 25 MG 24 hr tablet Take 0.5 tablets (12.5 mg total) by mouth daily. 45 tablet 3  . nitroGLYCERIN (NITROSTAT) 0.4 MG SL tablet Place 1 tablet (0.4 mg total) under the tongue every 5 (five) minutes as needed for chest pain. 25 tablet 3   No facility-administered medications prior to visit.  Allergies:   Vicodin [hydrocodone-acetaminophen]   Social History   Socioeconomic History  . Marital status: Married    Spouse name: Not on file  . Number of children: 4  . Years of education: Not on file  . Highest education level: Not on file  Occupational History  . Not on file  Social Needs  . Financial resource strain: Not on file  . Food insecurity:    Worry: Not on file    Inability: Not on file  . Transportation needs:    Medical: Not on file    Non-medical: Not on file  Tobacco Use  . Smoking status: Heavy Tobacco  Smoker    Packs/day: 2.00    Years: 41.00    Pack years: 82.00    Types: Cigarettes    Start date: 72  . Smokeless tobacco: Never Used  . Tobacco comment: one pack daily  Substance and Sexual Activity  . Alcohol use: No    Alcohol/week: 0.0 oz  . Drug use: No  . Sexual activity: Yes    Birth control/protection: None  Lifestyle  . Physical activity:    Days per week: Not on file    Minutes per session: Not on file  . Stress: Not on file  Relationships  . Social connections:    Talks on phone: Not on file    Gets together: Not on file    Attends religious service: Not on file    Active member of club or organization: Not on file    Attends meetings of clubs or organizations: Not on file    Relationship status: Not on file  Other Topics Concern  . Not on file  Social History Narrative  . Not on file     Family History:  The patient's ***family history includes Colon cancer in his father; Heart attack in his father and mother.   Review of Systems:   Please see the history of present illness.     General:  No chills, fever, night sweats or weight changes.  Cardiovascular:  No chest pain, dyspnea on exertion, edema, orthopnea, palpitations, paroxysmal nocturnal dyspnea. Dermatological: No rash, lesions/masses Respiratory: No cough, dyspnea Urologic: No hematuria, dysuria Abdominal:   No nausea, vomiting, diarrhea, bright red blood per rectum, melena, or hematemesis Neurologic:  No visual changes, wkns, changes in mental status. All other systems reviewed and are otherwise negative except as noted above.   Physical Exam:    VS:  There were no vitals taken for this visit.   General: Well developed, well nourished,male appearing in no acute distress. Head: Normocephalic, atraumatic, sclera non-icteric, no xanthomas, nares are without discharge.  Neck: No carotid bruits. JVD not elevated.  Lungs: Respirations regular and unlabored, without wheezes or rales.  Heart:  ***Regular rate and rhythm. No S3 or S4.  No murmur, no rubs, or gallops appreciated. Abdomen: Soft, non-tender, non-distended with normoactive bowel sounds. No hepatomegaly. No rebound/guarding. No obvious abdominal masses. Msk:  Strength and tone appear normal for age. No joint deformities or effusions. Extremities: No clubbing or cyanosis. No edema.  Distal pedal pulses are 2+ bilaterally. Neuro: Alert and oriented X 3. Moves all extremities spontaneously. No focal deficits noted. Psych:  Responds to questions appropriately with a normal affect. Skin: No rashes or lesions noted  Wt Readings from Last 3 Encounters:  11/07/17 184 lb (83.5 kg)  11/02/17 184 lb (83.5 kg)  09/21/17 188 lb (85.3 kg)        Studies/Labs Reviewed:  EKG:  EKG is*** ordered today.  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  Right dominant coronary artery with no evidence of obstructive disease.  Normal left main with possibly 20 to 30% proximal narrowing.  LAD is widely patent with eccentric 30% narrowing after the first diagonal.  First diagonal contains eccentric 60% narrowing.  Angiographically normal circumflex coronary artery giving origin to 3 small obtuse marginal branches  The left ventricular cavity is normal in size.  No regional wall motion abnormalities noted.  EF is 60%.  RECOMMENDATIONS:   Clinical correlation with findings.  No significant CAD  angiographically.  Reassess cardiac CT for evidence of cameral fistula as described above.   In absence of heart failure/LV systolic dysfunction and with no evidence of elevated end-diastolic pressure, therapy for the cameral fistula is not felt to be needed.  Should the patient develop LV enlargement, heart failure symptoms, etc, management of the fistula could be with coiling/embolization vs covered stent to occlude or exclude the arterial source originating from a distal RCA LV branch.  Assessment:    No diagnosis found.   Plan:   In order of problems listed above:  1. CAD  2. HTN  3. Tobacco Use    Medication Adjustments/Labs and Tests Ordered: Current medicines are reviewed at length with the patient today.  Concerns regarding medicines are outlined above.  Medication changes, Labs and Tests ordered today are listed in the Patient Instructions below. There are no Patient Instructions on file for this visit.   Signed, Erma Heritage, PA-C  11/29/2017 11:50 AM    [place] [address] [place], [place] [address] Phone: [phone]

## 2017-11-30 ENCOUNTER — Ambulatory Visit: Payer: PPO | Admitting: Student

## 2017-11-30 ENCOUNTER — Encounter: Payer: Self-pay | Admitting: Student

## 2017-12-17 ENCOUNTER — Encounter (HOSPITAL_COMMUNITY): Payer: Self-pay | Admitting: Emergency Medicine

## 2017-12-17 ENCOUNTER — Emergency Department (HOSPITAL_COMMUNITY)
Admission: EM | Admit: 2017-12-17 | Discharge: 2017-12-18 | Disposition: A | Discharge status: Home / Self Care | Payer: PPO | Attending: Emergency Medicine | Admitting: Emergency Medicine

## 2017-12-17 ENCOUNTER — Other Ambulatory Visit: Payer: Self-pay

## 2017-12-17 DIAGNOSIS — I2510 Atherosclerotic heart disease of native coronary artery without angina pectoris: Secondary | ICD-10-CM | POA: Insufficient documentation

## 2017-12-17 DIAGNOSIS — I10 Essential (primary) hypertension: Secondary | ICD-10-CM | POA: Diagnosis not present

## 2017-12-17 DIAGNOSIS — F10920 Alcohol use, unspecified with intoxication, uncomplicated: Secondary | ICD-10-CM

## 2017-12-17 DIAGNOSIS — F17210 Nicotine dependence, cigarettes, uncomplicated: Secondary | ICD-10-CM | POA: Insufficient documentation

## 2017-12-17 DIAGNOSIS — F10120 Alcohol abuse with intoxication, uncomplicated: Secondary | ICD-10-CM | POA: Diagnosis not present

## 2017-12-17 DIAGNOSIS — F4324 Adjustment disorder with disturbance of conduct: Secondary | ICD-10-CM | POA: Diagnosis not present

## 2017-12-17 DIAGNOSIS — Z79899 Other long term (current) drug therapy: Secondary | ICD-10-CM | POA: Diagnosis not present

## 2017-12-17 DIAGNOSIS — Z7982 Long term (current) use of aspirin: Secondary | ICD-10-CM | POA: Diagnosis not present

## 2017-12-17 DIAGNOSIS — R45851 Suicidal ideations: Secondary | ICD-10-CM | POA: Insufficient documentation

## 2017-12-17 DIAGNOSIS — J449 Chronic obstructive pulmonary disease, unspecified: Secondary | ICD-10-CM | POA: Insufficient documentation

## 2017-12-17 LAB — COMPREHENSIVE METABOLIC PANEL
ALT: 16 U/L — ABNORMAL LOW (ref 17–63)
AST: 19 U/L (ref 15–41)
Albumin: 4 g/dL (ref 3.5–5.0)
Alkaline Phosphatase: 56 U/L (ref 38–126)
Anion gap: 10 (ref 5–15)
BILIRUBIN TOTAL: 0.9 mg/dL (ref 0.3–1.2)
BUN: 7 mg/dL (ref 6–20)
CALCIUM: 8.7 mg/dL — AB (ref 8.9–10.3)
CO2: 22 mmol/L (ref 22–32)
Chloride: 102 mmol/L (ref 101–111)
Creatinine, Ser: 0.7 mg/dL (ref 0.61–1.24)
GFR calc Af Amer: >60 mL/min (ref >60)
Glucose, Bld: 99 mg/dL (ref 65–99)
POTASSIUM: 3.9 mmol/L (ref 3.5–5.1)
Sodium: 134 mmol/L — ABNORMAL LOW (ref 135–145)
TOTAL PROTEIN: 6.6 g/dL (ref 6.5–8.1)

## 2017-12-17 LAB — CBC
HCT: 48.6 % (ref 39.0–52.0)
Hemoglobin: 16.9 g/dL (ref 13.0–17.0)
MCH: 32.1 pg (ref 26.0–34.0)
MCHC: 34.8 g/dL (ref 30.0–36.0)
MCV: 92.2 fL (ref 78.0–100.0)
PLATELETS: 201 10*3/uL (ref 150–400)
RBC: 5.27 MIL/uL (ref 4.22–5.81)
RDW: 12.6 % (ref 11.5–15.5)
WBC: 11.6 10*3/uL — AB (ref 4.0–10.5)

## 2017-12-17 LAB — ETHANOL: Alcohol, Ethyl (B): 118 mg/dL — ABNORMAL HIGH (ref <10)

## 2017-12-17 LAB — SALICYLATE LEVEL: Salicylate Lvl: <7 mg/dL (ref 2.8–30.0)

## 2017-12-17 LAB — ACETAMINOPHEN LEVEL: Acetaminophen (Tylenol), Serum: <10 ug/mL — ABNORMAL LOW (ref 10–30)

## 2017-12-17 NOTE — ED Triage Notes (Addendum)
Pt brought in by [employer]. Pt states his wife left him recently. Pt states he has been drinking tonight so his son called the police. Pt admits to drinking 6 cans of beer. Police advise pt wanted them to kill him however, he denies SI/HI.

## 2017-12-17 NOTE — ED Notes (Signed)
Contact information  Son Simonne ComeLeo [phone] Molly MaduroRobert [phone]

## 2017-12-17 NOTE — ED Notes (Signed)
ED Provider at bedside. 

## 2017-12-17 NOTE — ED Provider Notes (Signed)
[facility] EMERGENCY DEPARTMENT Provider Note   CSN: [id number] Arrival date & time: 12/17/17  2201     History   Chief Complaint Chief Complaint  Patient presents with  . V70.1    HPI [name] Carter is a 57 y.o. male.  HPI  This is a 57 year old male with a history of COPD, hypertension who presents with police for alcohol intoxication and making threatening statements.  Per [facility] report, they were called out for a drug overdose.  Upon arrival, patient was very resistant to police intervention.  Per [employer], patient stated "just shoot me."  He also verbally threatened his wife.  Please state that it took approximately 1 hour for them to get him contained.  He was evaluated by EMS where he had abnormal EKG but refused treatment.  Patient currently is without any pain.  Patient states that his wife recently left him and he has been very upset.  He reports drinking more alcohol today than normal.  He states that he does not normally drink daily.  He denies any drug use.  Patient states that he is just very upset and overwhelmed regarding his wife leaving him.  He states he would never hurt himself or anyone else.  He denies any history of self-harm.  He does endorse that he made the statements to police but states "I was just frustrated."    Based on chart review, no notable psychiatric evaluations or history of the same.  Past Medical History:  Diagnosis Date  . Chronic back pain   . COPD (chronic obstructive pulmonary disease) ([place])   . DDD (degenerative disc disease), cervical   . DDD (degenerative disc disease), lumbar   . HTN (hypertension)   . Neck pain   . Pneumonia     Patient Active Problem List   Diagnosis Date Noted  . CAD in native artery 11/07/2017  . Hypokalemia 09/18/2017  . Hyperglycemia 09/17/2017  . Abnormal EKG 09/17/2017  . 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 GERD (gastroesophageal reflux disease) 05/20/2015  . Community  acquired pneumonia 04/25/2015  . Abdominal pain, epigastric 02/12/2015  . LLQ pain 02/12/2015  . Gastritis and gastroduodenitis 02/12/2015  . Constipation 02/12/2015  . COPD exacerbation ([place]) 08/20/2014  . COPD with acute exacerbation ([place]) 06/27/2014  . Chronic pain 06/27/2014  . Acute respiratory failure with hypoxia ([place]) 06/13/2014  . CAP (community acquired pneumonia) 05/16/2014  . Chest pain   . Acute respiratory failure ([place]) 05/13/2014  . Tobacco dependence 05/13/2014    Past Surgical History:  Procedure Laterality Date  . BACK SURGERY     low back x4  . BILATERAL CARPAL TUNNEL RELEASE    . bilteral knee surgery    . COLONOSCOPY  07/2012   [place] MD: MAC. Random colon biopsies benign. Rectal polyp tubular adenoma.  . ESOPHAGOGASTRODUODENOSCOPY  07/2012   [place] MD: MAC. Chronic gastritis and intestinal metaplasia, ?metaplastic atrophic gastritis, negative H.pylori, no celiac, duidnal mucosa with mild Brunner gland hyperplasia, nonspecific.   Marland Kitchen LEFT HEART CATH AND CORONARY ANGIOGRAPHY N/A 11/07/2017   Procedure: LEFT HEART CATH AND CORONARY ANGIOGRAPHY;  Surgeon: Belva Crome, MD;  Location: [place] [facility];  Service: Cardiovascular;  Laterality: N/A;  . ULNAR NERVE TRANSPOSITION     ? by description, not clear        Home Medications    Prior to Admission medications   Medication Sig Start Date End Date Taking? Authorizing Provider  albuterol (PROVENTIL HFA;VENTOLIN HFA) 108 (90  BASE) MCG/ACT inhaler Inhale 2 puffs into the lungs every 4 (four) hours as needed for wheezing or shortness of breath. 08/21/14   Samuella Cota, MD  albuterol (PROVENTIL) (2.5 MG/3ML) 0.083% nebulizer solution Take 3 mLs (2.5 mg total) by nebulization every 6 (six) hours as needed for wheezing or shortness of breath. 09/18/17   Sinda Du, MD  aspirin EC 81 MG tablet Take 1 tablet (81 mg total) by mouth daily. 09/21/17   Arnoldo Lenis, MD  metoprolol succinate (TOPROL XL)  25 MG 24 hr tablet Take 0.5 tablets (12.5 mg total) by mouth daily. 11/02/17   Strader, Fransisco Hertz, PA-C  nitroGLYCERIN (NITROSTAT) 0.4 MG SL tablet Place 1 tablet (0.4 mg total) under the tongue every 5 (five) minutes as needed for chest pain. 11/02/17 01/31/18  Erma Heritage, PA-C    Family History Family History  Problem Relation Age of Onset  . Heart attack Father   . Colon cancer Father        diagnosed in his 56s  . Heart attack Mother     Social History Social History   Tobacco Use  . Smoking status: Heavy Tobacco Smoker    Packs/day: 2.00    Years: 41.00    Pack years: 82.00    Types: Cigarettes    Start date: 65  . Smokeless tobacco: Never Used  . Tobacco comment: one pack daily  Substance Use Topics  . Alcohol use: No    Alcohol/week: 0.0 oz  . Drug use: No     Allergies   Vicodin [hydrocodone-acetaminophen]   Review of Systems Review of Systems  Constitutional: Negative for fever.  Respiratory: Negative for shortness of breath.   Cardiovascular: Negative for chest pain.  Gastrointestinal: Negative for abdominal pain.  Psychiatric/Behavioral: Positive for dysphoric mood. Negative for self-injury, sleep disturbance and suicidal ideas.  All other systems reviewed and are negative.    Physical Exam Updated Vital Signs BP (!) 147/98 (BP Location: Right Arm)   Pulse 90   Temp 98.2 F (36.8 C) (Oral)   Resp 18   Ht 5' 9"  (1.753 m)   Wt 83 kg (183 lb)   SpO2 95%   BMI 27.02 kg/m   Physical Exam  Constitutional: He is oriented to person, place, and time. He appears well-developed and well-nourished.  Cooperative, no acute distress  HENT:  Head: Normocephalic and atraumatic.  Eyes: Pupils are equal, round, and reactive to light.  Neck: Neck supple.  Cardiovascular: Normal rate, regular rhythm and normal heart sounds.  No murmur heard. Pulmonary/Chest: Effort normal. No respiratory distress. He has wheezes. He has rales.  Fair air movement,  rales and wheezing in all lung fields  Abdominal: Soft. Bowel sounds are normal. There is no tenderness. There is no rebound.  Musculoskeletal: He exhibits no edema.  Neurological: He is alert and oriented to person, place, and time.  Skin: Skin is warm and dry.  Psychiatric: He has a normal mood and affect.  Cooperative, denies SI or HI  Nursing note and vitals reviewed.    ED Treatments / Results  Labs (all labs ordered are listed, but only abnormal results are displayed) Labs Reviewed  COMPREHENSIVE METABOLIC PANEL - Abnormal; Notable for the following components:      Result Value   Sodium 134 (*)    Calcium 8.7 (*)    ALT 16 (*)    All other components within normal limits  ETHANOL - Abnormal; Notable for the following components:  Alcohol, Ethyl (B) 118 (*)    All other components within normal limits  ACETAMINOPHEN LEVEL - Abnormal; Notable for the following components:   Acetaminophen (Tylenol), Serum <10 (*)    All other components within normal limits  CBC - Abnormal; Notable for the following components:   WBC 11.6 (*)    All other components within normal limits  RAPID URINE DRUG SCREEN, HOSP PERFORMED - Abnormal; Notable for the following components:   Benzodiazepines POSITIVE (*)    Barbiturates   (*)    Value: Result not available. Reagent lot number recalled by manufacturer.   All other components within normal limits  SALICYLATE LEVEL    EKG None  Radiology No results found.  Procedures Procedures (including critical care time)  Medications Ordered in ED Medications - No data to display   Initial Impression / Assessment and Plan / ED Course  I have reviewed the triage vital signs and the nursing notes.  Pertinent labs & imaging results that were available during my care of the patient were reviewed by me and considered in my medical decision making (see chart for details).  Clinical Course as of Dec 19 435  Tue Dec 18, 2017  0412 Patient  evaluated by TTS.  Recommend a.m. psych evaluation.  I discussed this with patient.  He is very upset that he has to stay until the morning.  I discussed with him that given that he made strong claims and threatening comments to the sheriff's office, these have to be investigated fully and psychiatry evaluation is warranted.     [CH]    Clinical Course User Index [CH] Horton, Barbette Hair, MD    Patient presents with statements that were threatening and indicated suicidality to shift department.  He does indicate that he said these things but contracts for safety with me.  He also reports heavy drinking today which he states is out of the norm.  He does not have a significant history of psychiatric illness or prior hospitalizations.  We will have psychiatry evaluate.  He is medically clear for TTS evaluation.  At this time I do not feel strongly that he should be IVC'd.  Final Clinical Impressions(s) / ED Diagnoses   Final diagnoses:  Alcoholic intoxication without complication [facility])    ED Discharge Orders    None       Dina Rich, Barbette Hair, MD 12/18/17 [phone]

## 2017-12-18 LAB — RAPID URINE DRUG SCREEN, HOSP PERFORMED
AMPHETAMINES: NOT DETECTED
Benzodiazepines: POSITIVE — AB
Cocaine: NOT DETECTED
Opiates: NOT DETECTED
Tetrahydrocannabinol: NOT DETECTED

## 2017-12-18 NOTE — ED Notes (Signed)
Pt requesting to see edp again, states 'I am ready to go home" pt denies any SI or HI, states " I dont need this" , admits to etoh use tonight, [employer] remain at bedside, comfort measures provided,

## 2017-12-18 NOTE — Progress Notes (Signed)
Per note by [facility] Nurse, Leitha Bleakebecca Cockram, RN, pt requested and EDP agreed to discharge prior to pt being [facility] by [facility] Physician Extender.  Timmothy EulerJean T. Kaylyn LimSutter, MSW, LCSWA Disposition Clinical Social Work [phone] (cell) [phone] (office)

## 2017-12-18 NOTE — ED Notes (Signed)
[facility] that pt needs to be reassessed by psychiatry in the morning.

## 2017-12-18 NOTE — BH Assessment (Addendum)
Tele Assessment Note   Patient Name: [name] Carter MRN: [id number] Referring Physician: PA Loma Sousa Location of Patient: [facility] ED Location of Provider: [place] is an 57 y.o. male.  The pt was brought in by the police after the police got a call about an overdose.  The pt stated he is not sure who called the police.  It is reported by the police that the pt told the police to shoot him.  The pt denies SI currently and kept repeating he wanted to go home, so he could meet his brother at 5 AM and go to [place] to go fishing.  The pt recently separated from his wife and describes that as his biggest stressor.  Since the separation the pt has been drinking more than he normally drinks.  He stated he does not drink everyday.  When he arrived at the hospital his blood alcohol level was 118.  The pt denied mental health history and current mental health issues.  He denies ever seeing a counselor or psychiatrist in the past.  He denies any previous SI, self harm, access to guns and HI.  He denies any legal issues, abuse, problems sleeping or eating.  He stated he was sad when his wife left him, but denied depressive symptoms.  The pt denied drug use.  His UDS is negative for all substances except for benzodiazepines.  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 Chronic back pain   . COPD (chronic obstructive pulmonary disease) ([place])   . DDD (degenerative disc disease), cervical   . DDD (degenerative disc disease), lumbar   . HTN (hypertension)   . Neck pain   . Pneumonia     Past Surgical History:  Procedure Laterality Date  . BACK SURGERY     low back x4  . BILATERAL CARPAL TUNNEL RELEASE    . bilteral knee surgery    . COLONOSCOPY  07/2012   [place] MD: MAC. Random colon biopsies benign. Rectal polyp tubular adenoma.  . ESOPHAGOGASTRODUODENOSCOPY  07/2012   [place] MD: MAC. Chronic gastritis and intestinal metaplasia, ?metaplastic atrophic gastritis, negative H.pylori, no celiac, duidnal mucosa with mild Brunner gland hyperplasia, nonspecific.   Marland Kitchen LEFT HEART CATH AND CORONARY ANGIOGRAPHY N/A 11/07/2017   Procedure: LEFT HEART CATH AND CORONARY ANGIOGRAPHY;  Surgeon: Belva Crome, MD;  Location: [place] [facility];  Service: Cardiovascular;  Laterality: N/A;  . ULNAR NERVE TRANSPOSITION     ? by description, not clear    Family History:  Family History  Problem Relation Age of Onset  . Heart attack Father   . Colon cancer Father        diagnosed in his 75s  . Heart attack Mother     Social History:  reports that he has been smoking cigarettes.  He started smoking about 42 years ago. He has a 82.00 pack-year smoking history. He has never used smokeless tobacco. He reports that he does not drink alcohol or use drugs.  Additional Social History:  Alcohol / Drug Use Pain Medications: See MAR Prescriptions: See MAR Over the Counter: See MAR History of alcohol / drug use?: No history of alcohol / drug abuse Longest period of sobriety (when/how long): NA  CIWA: CIWA-Ar BP: (!) 147/98 Pulse Rate: 90 COWS:    Allergies:  Allergies  Allergen Reactions  . Vicodin [Hydrocodone-Acetaminophen] Nausea And Vomiting and Other (See  Comments)    Made patient feel funny, stomach pain    Home Medications:  (Not in a hospital admission)  OB/GYN Status:  No LMP for male patient.  General Assessment Data Location of Assessment: [facility] TTS Assessment: In system Is this a Tele or Face-to-Face Assessment?: Tele Assessment Is this an Initial Assessment or a Re-assessment for this encounter?: Initial Assessment Marital status: Separated Maiden name: NA Is patient pregnant?: Other (Comment)(male) Living Arrangements: Non-relatives/Friends Can pt return to current living arrangement?: Yes Admission Status: Voluntary Is patient capable of signing  voluntary admission?: Yes Referral Source: Self/Family/Friend Insurance type: Medicare     Crisis Care Plan Living Arrangements: Non-relatives/Friends Legal Guardian: Other:(Self) Name of Psychiatrist: none Name of Therapist: none  Education Status Is patient currently in school?: No  Risk to self with the past 6 months Suicidal Ideation: No-Not Currently/Within Last 6 Months Has patient been a risk to self within the past 6 months prior to admission? : Yes Suicidal Intent: No-Not Currently/Within Last 6 Months Has patient had any suicidal intent within the past 6 months prior to admission? : Yes Is patient at risk for suicide?: Yes Suicidal Plan?: No-Not Currently/Within Last 6 Months Has patient had any suicidal plan within the past 6 months prior to admission? : Yes Access to Means: No What has been your use of drugs/alcohol within the last 12 months?: drinking alcohol today Previous Attempts/Gestures: Yes How many times?: 1 Other Self Harm Risks: none Triggers for Past Attempts: Spouse contact Intentional Self Injurious Behavior: None Family Suicide History: No Recent stressful life event(s): Conflict (Comment)(seperation from spouse) Persecutory voices/beliefs?: No Depression: No Depression Symptoms: (denies all) Substance abuse history and/or treatment for substance abuse?: No Suicide prevention information given to non-admitted patients: Not applicable  Risk to Others within the past 6 months Homicidal Ideation: No Does patient have any lifetime risk of violence toward others beyond the six months prior to admission? : No Thoughts of Harm to Others: No Current Homicidal Intent: No Current Homicidal Plan: No Access to Homicidal Means: No Identified Victim: none History of harm to others?: No Assessment of Violence: None Noted Violent Behavior Description: none Does patient have access to weapons?: No Criminal Charges Pending?: No Does patient have a court  date: No Is patient on probation?: No  Psychosis Hallucinations: None noted Delusions: None noted  Mental Status Report Appearance/Hygiene: In scrubs, Unremarkable Eye Contact: Good Motor Activity: Unremarkable Speech: Logical/coherent, Rapid Level of Consciousness: Alert Mood: Anxious Affect: Anxious Anxiety Level: None Thought Processes: Coherent, Relevant Judgement: Partial Orientation: Person, Place, Time, Situation, Appropriate for developmental age Obsessive Compulsive Thoughts/Behaviors: None  Cognitive Functioning Concentration: Normal Memory: Recent Intact, Remote Intact Is patient IDD: No Is patient DD?: No Insight: Fair Impulse Control: Fair Appetite: Good Have you had any weight changes? : No Change Sleep: No Change Total Hours of Sleep: 8 Vegetative Symptoms: None  ADLScreening [facility] Assessment Services) Patient's cognitive ability adequate to safely complete daily activities?: Yes Patient able to express need for assistance with ADLs?: Yes Independently performs ADLs?: Yes (appropriate for developmental age)  Prior Inpatient Therapy Prior Inpatient Therapy: No  Prior Outpatient Therapy Prior Outpatient Therapy: No Does patient have an ACCT team?: No Does patient have Intensive In-House Services?  : No Does patient have [facility] services? : No Does patient have P4CC services?: No  ADL Screening (condition at time of admission) Patient's cognitive ability adequate to safely complete daily activities?: Yes Patient able to express need for assistance with ADLs?: Yes  Independently performs ADLs?: Yes (appropriate for developmental age)       Abuse/Neglect Assessment (Assessment to be complete while patient is alone) Abuse/Neglect Assessment Can Be Completed: Yes Physical Abuse: Denies Verbal Abuse: Denies Sexual Abuse: Denies Exploitation of patient/patient's resources: Denies Self-Neglect: Denies Values / Beliefs Cultural Requests During  Hospitalization: None Spiritual Requests During Hospitalization: None Consults Spiritual Care Consult Needed: No Social Work Consult Needed: No Regulatory affairs officer (For Healthcare) Does Patient Have a Medical Advance Directive?: No          Disposition:  Disposition Initial Assessment Completed for this Encounter: Yes   NP Lindon Romp recommends the pt be observed overnight for safety and stabilization.  The pt is to be reassessed by psychiatry in the morning.  RN Legrand Como was made aware of the recommendation.   This service was provided via telemedicine using a 2-way, interactive audio and video technology.  Names of all persons participating in this telemedicine service and their role in this encounter. Name: Franck Vinal Role: Pt  Name: Virgina Organ Role: TTS  Name:  Role:   Name:  Role:     Enzo Montgomery 12/18/2017 4:28 AM

## 2017-12-18 NOTE — ED Notes (Signed)
Pt wanted to leave, EDP at the bedside to talk with patient. Pt denies any intent to harm himself or others. Pt called Brother to come at pick him up. Pt [place], his brother is taking him today. Pt states he has never been in trouble and he drank to much yesterday, due to situation with his wife. Security at the bedside returning patients belongings. Patient given discharge instruction, verbalized understand. Patient ambulatory out of the department.

## 2017-12-18 NOTE — ED Provider Notes (Signed)
8:16 AM Patient is requesting to leave.  I have reviewed his chart and talk to the patient.  He states that he is going through a rough patch where his wife has left him.  He is then been having on and off hopes of either return or seeing his children/grandchildren.  He was drinking a couple beers last night when a friend came over and they started drinking a lot more.  He got excessively drunk which she states is not normal.  He does not remember asking the police officer to shoot him.  He has no suicidal or homicidal thoughts.  At this point he just wants to leave.  Patient otherwise does not seem to have significant psychiatric history and I do not think he is an active threat to himself or others.  I think this is more alcohol intoxication than him truly wanting to die.  I think is reasonable for him to be discharged.  Discharge home with return precautions.   Pricilla LovelessGoldston, Bryanne Riquelme, MD 12/18/17 [phone]

## 2017-12-18 NOTE — ED Notes (Signed)
[employer] no longer with pt.

## 2017-12-18 NOTE — Discharge Instructions (Addendum)
If you feel thoughts of wanting to hurt yourself, hurt others, or kill yourself, return to the ER or call 911 immediately.  Otherwise follow-up with your primary care physician in about a week.

## 2017-12-19 DIAGNOSIS — J441 Chronic obstructive pulmonary disease with (acute) exacerbation: Secondary | ICD-10-CM | POA: Diagnosis not present

## 2018-01-18 DIAGNOSIS — J441 Chronic obstructive pulmonary disease with (acute) exacerbation: Secondary | ICD-10-CM | POA: Diagnosis not present

## 2018-02-09 DIAGNOSIS — J449 Chronic obstructive pulmonary disease, unspecified: Secondary | ICD-10-CM | POA: Diagnosis not present

## 2018-02-12 DIAGNOSIS — M542 Cervicalgia: Secondary | ICD-10-CM | POA: Diagnosis not present

## 2018-02-17 ENCOUNTER — Emergency Department (HOSPITAL_COMMUNITY): Admission: EM | Admit: 2018-02-17 | Discharge: 2018-02-17 | Discharge status: Absent without leave | Payer: PPO

## 2018-02-18 DIAGNOSIS — J441 Chronic obstructive pulmonary disease with (acute) exacerbation: Secondary | ICD-10-CM | POA: Diagnosis not present

## 2018-02-23 ENCOUNTER — Ambulatory Visit (HOSPITAL_COMMUNITY)
Admission: RE | Admit: 2018-02-23 | Discharge: 2018-02-23 | Disposition: A | Discharge status: Home / Self Care | Payer: PPO | Source: Ambulatory Visit | Attending: Pulmonary Disease | Admitting: Pulmonary Disease

## 2018-02-23 ENCOUNTER — Other Ambulatory Visit (HOSPITAL_COMMUNITY): Payer: Self-pay | Admitting: Pulmonary Disease

## 2018-02-23 DIAGNOSIS — R079 Chest pain, unspecified: Secondary | ICD-10-CM

## 2018-02-23 DIAGNOSIS — M546 Pain in thoracic spine: Secondary | ICD-10-CM

## 2018-03-12 DIAGNOSIS — J449 Chronic obstructive pulmonary disease, unspecified: Secondary | ICD-10-CM | POA: Diagnosis not present

## 2018-03-21 DIAGNOSIS — J441 Chronic obstructive pulmonary disease with (acute) exacerbation: Secondary | ICD-10-CM | POA: Diagnosis not present

## 2018-04-08 DIAGNOSIS — J449 Chronic obstructive pulmonary disease, unspecified: Secondary | ICD-10-CM | POA: Diagnosis not present

## 2018-04-08 DIAGNOSIS — M542 Cervicalgia: Secondary | ICD-10-CM | POA: Diagnosis not present

## 2018-04-08 DIAGNOSIS — F419 Anxiety disorder, unspecified: Secondary | ICD-10-CM | POA: Diagnosis not present

## 2018-04-08 DIAGNOSIS — F17200 Nicotine dependence, unspecified, uncomplicated: Secondary | ICD-10-CM | POA: Diagnosis not present

## 2018-04-11 DIAGNOSIS — J449 Chronic obstructive pulmonary disease, unspecified: Secondary | ICD-10-CM | POA: Diagnosis not present

## 2018-04-20 DIAGNOSIS — J441 Chronic obstructive pulmonary disease with (acute) exacerbation: Secondary | ICD-10-CM | POA: Diagnosis not present

## 2018-05-06 ENCOUNTER — Other Ambulatory Visit: Payer: Self-pay

## 2018-05-12 DIAGNOSIS — J449 Chronic obstructive pulmonary disease, unspecified: Secondary | ICD-10-CM | POA: Diagnosis not present

## 2018-05-15 DIAGNOSIS — Z79891 Long term (current) use of opiate analgesic: Secondary | ICD-10-CM | POA: Diagnosis not present

## 2018-05-21 DIAGNOSIS — J441 Chronic obstructive pulmonary disease with (acute) exacerbation: Secondary | ICD-10-CM | POA: Diagnosis not present

## 2018-06-11 DIAGNOSIS — J449 Chronic obstructive pulmonary disease, unspecified: Secondary | ICD-10-CM | POA: Diagnosis not present

## 2018-06-20 DIAGNOSIS — J441 Chronic obstructive pulmonary disease with (acute) exacerbation: Secondary | ICD-10-CM | POA: Diagnosis not present

## 2018-07-12 DIAGNOSIS — J449 Chronic obstructive pulmonary disease, unspecified: Secondary | ICD-10-CM | POA: Diagnosis not present

## 2018-07-21 DIAGNOSIS — J441 Chronic obstructive pulmonary disease with (acute) exacerbation: Secondary | ICD-10-CM | POA: Diagnosis not present

## 2018-07-28 ENCOUNTER — Emergency Department (HOSPITAL_COMMUNITY): Payer: PPO

## 2018-07-28 ENCOUNTER — Other Ambulatory Visit: Payer: Self-pay

## 2018-07-28 ENCOUNTER — Encounter (HOSPITAL_COMMUNITY): Payer: Self-pay | Admitting: Emergency Medicine

## 2018-07-28 ENCOUNTER — Emergency Department (HOSPITAL_COMMUNITY)
Admission: EM | Admit: 2018-07-28 | Discharge: 2018-07-28 | Disposition: A | Discharge status: Home / Self Care | Payer: PPO | Attending: Emergency Medicine | Admitting: Emergency Medicine

## 2018-07-28 DIAGNOSIS — J449 Chronic obstructive pulmonary disease, unspecified: Secondary | ICD-10-CM | POA: Diagnosis not present

## 2018-07-28 DIAGNOSIS — R0602 Shortness of breath: Secondary | ICD-10-CM | POA: Diagnosis not present

## 2018-07-28 DIAGNOSIS — I10 Essential (primary) hypertension: Secondary | ICD-10-CM | POA: Insufficient documentation

## 2018-07-28 DIAGNOSIS — F17210 Nicotine dependence, cigarettes, uncomplicated: Secondary | ICD-10-CM | POA: Diagnosis not present

## 2018-07-28 DIAGNOSIS — R0789 Other chest pain: Secondary | ICD-10-CM | POA: Insufficient documentation

## 2018-07-28 DIAGNOSIS — R079 Chest pain, unspecified: Secondary | ICD-10-CM | POA: Diagnosis not present

## 2018-07-28 DIAGNOSIS — R072 Precordial pain: Secondary | ICD-10-CM

## 2018-07-28 LAB — CBC
HEMATOCRIT: 47.5 % (ref 39.0–52.0)
HEMOGLOBIN: 15.4 g/dL (ref 13.0–17.0)
MCH: 30.7 pg (ref 26.0–34.0)
MCHC: 32.4 g/dL (ref 30.0–36.0)
MCV: 94.8 fL (ref 80.0–100.0)
Platelets: 211 10*3/uL (ref 150–400)
RBC: 5.01 MIL/uL (ref 4.22–5.81)
RDW: 12.6 % (ref 11.5–15.5)
WBC: 10.3 10*3/uL (ref 4.0–10.5)
nRBC: 0 % (ref 0.0–0.2)

## 2018-07-28 LAB — BASIC METABOLIC PANEL
Anion gap: 8 (ref 5–15)
BUN: 14 mg/dL (ref 6–20)
CHLORIDE: 104 mmol/L (ref 98–111)
CO2: 26 mmol/L (ref 22–32)
Calcium: 9.1 mg/dL (ref 8.9–10.3)
Creatinine, Ser: 0.91 mg/dL (ref 0.61–1.24)
GFR calc non Af Amer: >60 mL/min (ref >60)
Glucose, Bld: 123 mg/dL — ABNORMAL HIGH (ref 70–99)
POTASSIUM: 4.2 mmol/L (ref 3.5–5.1)
SODIUM: 138 mmol/L (ref 135–145)

## 2018-07-28 LAB — TROPONIN I: Troponin I: <0.03 ng/mL (ref <0.03)

## 2018-07-28 MED ORDER — SODIUM CHLORIDE 0.9% FLUSH: 3.0000 mL | Freq: Once | INTRAVENOUS | Status: DC

## 2018-07-28 MED ORDER — ASPIRIN 81 MG PO CHEW
324.0000 mg | CHEWABLE_TABLET | Freq: Once | ORAL | Status: AC
  Administered 2018-07-28: 324 mg via ORAL
  Filled 2018-07-28: qty 4

## 2018-07-28 NOTE — ED Provider Notes (Signed)
[facility] EMERGENCY DEPARTMENT Provider Note   CSN: [id number] Arrival date & time: 07/28/18  1339     History   Chief Complaint Chief Complaint  Patient presents with  . Chest Pain    HPI Tristyn Demarest is a 58 y.o. male.  Patient with a complaint of substernal chest pain on and off for the past 2 weeks.  But became constant at 4 this morning.  Is still present.  Patient was evaluated with cardiac cath in May 2019.  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  But no significant coronary artery disease.  Cardiac cath was done by Dr. Daneen Schick.  Patient has not been seen by cardiology since the cardiac cath.  Patient currently not taking aspirin.  Does not have nitro at home.  Patient states the pain radiates to left jaw and left arm.  No nausea vomiting no shortness of breath.  Oxygen saturations here on room air 99%.     Past Medical History:  Diagnosis Date  . Chronic back pain   . COPD (chronic obstructive pulmonary disease) ([place])   . DDD (degenerative disc disease), cervical   . DDD (degenerative disc disease), lumbar   . HTN (hypertension)   . Neck pain   . Pneumonia     Patient Active Problem List   Diagnosis Date Noted  . CAD in native artery 11/07/2017  . Hypokalemia 09/18/2017  . Hyperglycemia 09/17/2017  . Abnormal EKG 09/17/2017  . 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 GERD (gastroesophageal reflux disease) 05/20/2015  . Community acquired pneumonia 04/25/2015  . Abdominal pain, epigastric 02/12/2015  . LLQ pain 02/12/2015  . Gastritis and gastroduodenitis 02/12/2015  . Constipation 02/12/2015  . COPD exacerbation ([place]) 08/20/2014  . COPD with acute exacerbation ([place]) 06/27/2014  . Chronic pain 06/27/2014  . Acute respiratory failure with hypoxia ([place]) 06/13/2014  . CAP (community acquired pneumonia) 05/16/2014  . Chest pain   . Acute respiratory failure ([place]) 05/13/2014  . Tobacco dependence 05/13/2014    Past  Surgical History:  Procedure Laterality Date  . BACK SURGERY     low back x4  . BILATERAL CARPAL TUNNEL RELEASE    . bilteral knee surgery    . COLONOSCOPY  07/2012   [place] MD: MAC. Random colon biopsies benign. Rectal polyp tubular adenoma.  . ESOPHAGOGASTRODUODENOSCOPY  07/2012   [place] MD: MAC. Chronic gastritis and intestinal metaplasia, ?metaplastic atrophic gastritis, negative H.pylori, no celiac, duidnal mucosa with mild Brunner gland hyperplasia, nonspecific.   Marland Kitchen LEFT HEART CATH AND CORONARY ANGIOGRAPHY N/A 11/07/2017   Procedure: LEFT HEART CATH AND CORONARY ANGIOGRAPHY;  Surgeon: Belva Crome, MD;  Location: [place] [facility];  Service: Cardiovascular;  Laterality: N/A;  . ULNAR NERVE TRANSPOSITION     ? by description, not clear        Home Medications    Prior to Admission medications   Medication Sig Start Date End Date Taking? Authorizing Provider  albuterol (PROVENTIL HFA;VENTOLIN HFA) 108 (90 BASE) MCG/ACT inhaler Inhale 2 puffs into the lungs every 4 (four) hours as needed for wheezing or shortness of breath. 08/21/14   Samuella Cota, MD  albuterol (PROVENTIL) (2.5 MG/3ML) 0.083% nebulizer solution Take 3 mLs (2.5 mg total) by nebulization every 6 (six) hours as needed for wheezing or shortness of breath. 09/18/17   Sinda Du, MD  aspirin EC 81 MG tablet Take 1 tablet (81 mg total) by mouth daily. 09/21/17   Arnoldo Lenis, MD  metoprolol succinate (TOPROL XL) 25 MG 24 hr tablet Take 0.5 tablets (12.5 mg total) by mouth daily. 11/02/17   Strader, Fransisco Hertz, PA-C  nitroGLYCERIN (NITROSTAT) 0.4 MG SL tablet Place 1 tablet (0.4 mg total) under the tongue every 5 (five) minutes as needed for chest pain. 11/02/17 01/31/18  Erma Heritage, PA-C    Family History Family History  Problem Relation Age of Onset  . Heart attack Father   . Colon cancer Father        diagnosed in his 5s  . Heart attack Mother     Social History Social History   Tobacco  Use  . Smoking status: Heavy Tobacco Smoker    Packs/day: 2.00    Years: 41.00    Pack years: 82.00    Types: Cigarettes    Start date: 13  . Smokeless tobacco: Never Used  . Tobacco comment: one pack daily  Substance Use Topics  . Alcohol use: No    Alcohol/week: 0.0 standard drinks  . Drug use: No     Allergies   Vicodin [hydrocodone-acetaminophen]   Review of Systems Review of Systems  Constitutional: Negative for chills and fever.  HENT: Negative for rhinorrhea and sore throat.   Eyes: Negative for visual disturbance.  Respiratory: Negative for cough and shortness of breath.   Cardiovascular: Positive for chest pain. Negative for leg swelling.  Gastrointestinal: Negative for abdominal pain, diarrhea, nausea and vomiting.  Genitourinary: Negative for dysuria.  Musculoskeletal: Positive for neck pain. Negative for back pain.  Skin: Negative for rash.  Neurological: Negative for dizziness, syncope, light-headedness and headaches.  Hematological: Does not bruise/bleed easily.  Psychiatric/Behavioral: Negative for confusion.     Physical Exam Updated Vital Signs BP (!) 137/93 (BP Location: Right Arm)   Pulse 89   Temp 98 F (36.7 C) (Oral)   Resp (!) 24   Ht 1.676 m (_0 )   Wt 72.6 kg   SpO2 99%   BMI 25.82 kg/m   Physical Exam Vitals signs and nursing note reviewed.  Constitutional:      Appearance: He is well-developed.  HENT:     Head: Normocephalic and atraumatic.     Mouth/Throat:     Mouth: Mucous membranes are moist.  Eyes:     Conjunctiva/sclera: Conjunctivae normal.  Neck:     Musculoskeletal: Neck supple.  Cardiovascular:     Rate and Rhythm: Normal rate and regular rhythm.     Heart sounds: No murmur.  Pulmonary:     Effort: Pulmonary effort is normal. No respiratory distress.     Breath sounds: Normal breath sounds.  Abdominal:     Palpations: Abdomen is soft.     Tenderness: There is no abdominal tenderness.  Musculoskeletal:  Normal range of motion.        General: No swelling.  Skin:    General: Skin is warm and dry.  Neurological:     Mental Status: He is alert.      ED Treatments / Results  Labs (all labs ordered are listed, but only abnormal results are displayed) Labs Reviewed  BASIC METABOLIC PANEL - Abnormal; Notable for the following components:      Result Value   Glucose, Bld 123 (*)    All other components within normal limits  CBC  TROPONIN I    EKG EKG Interpretation  Date/Time:  Sunday July 28 2018 13:48:07 EST Ventricular Rate:  87 PR Interval:  168 QRS Duration: 104 QT Interval:  356  QTC Calculation: 428 R Axis:   -68 Text Interpretation:  Normal sinus rhythm Left anterior fascicular block Abnormal ECG Confirmed by Fredia Sorrow [phone] on 07/28/2018 4:54:13 PM   Radiology Dg Chest 2 View  Result Date: 07/28/2018 CLINICAL DATA:  Chest pain, left arm pain and shortness of breath. EXAM: CHEST - 2 VIEW COMPARISON:  02/24/1999 FINDINGS: The heart size and mediastinal contours are within normal limits. Mild interstitial prominence and hyperinflation again noted consistent with chronic lung disease. There is no evidence of pulmonary edema, consolidation, pneumothorax, nodule or pleural fluid. The visualized skeletal structures are unremarkable. IMPRESSION: Stable chronic lung disease.  No acute findings. Electronically Signed   By: Aletta Edouard M.D.   On: 07/28/2018 14:18    Procedures Procedures (including critical care time)  Medications Ordered in ED Medications  sodium chloride flush (NS) 0.9 % injection 3 mL (3 mLs Intravenous Not Given 07/28/18 1633)  aspirin chewable tablet 324 mg (has no administration in time range)     Initial Impression / Assessment and Plan / ED Course  I have reviewed the triage vital signs and the nursing notes.  Pertinent labs & imaging results that were available during my care of the patient were reviewed by me and considered in my  medical decision making (see chart for details).     Patient status post cardiac catheterization may 2019.  Without any significant coronary artery disease.  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  There was some concerns that may be some additional work would need to be done on this.  Patient has not followed up with cardiology since then.  Patient presents today with about a 2-week history of intermittent substernal chest pain became constant at 4 this morning.  Radiating to the left jaw.  And left arm.  Patient is currently not taking aspirin.  Patient does not have nitroglycerin.  Patient's initial labs to include troponin without acute findings.  EKG did have evidence of left anterior fascicular block.  But no significant change from past ones.  Chest x-ray negative.  Basic labs normal.  Patient still with ongoing chest pain.  Based on standard care recommended admission for cardiac rule out.  Reassuring that first troponin was negative.  Also reassuring cardiac cath in 2019 did not have any significant findings.  Other than mentioned above.  But patient still should be admitted for rule out.  Patient unable to stay understands the risks.  States he will follow-up with cardiology so given cardiology referral information.  Recommending a baby aspirin a day.  Patient will stay here for any further interventions at this time either.  States he has to go home.  Patient did agree to a full dose of aspirin here.  Patient will be signed out AMA.   Final Clinical Impressions(s) / ED Diagnoses   Final diagnoses:  Precordial pain    ED Discharge Orders    None       Fredia Sorrow, MD 07/28/18 1725

## 2018-07-28 NOTE — ED Triage Notes (Signed)
Patient c/o central chest pain that radiates into left arm and left side of jaw. Patient states pain has been intermittent for past month. Patient states pain is usually stress induced. Patient states a lot of recent stressors. Per patient started to become constant today. Per patient had cath done 1 year ago with blockages noted but no stent placement. Per patient shortness of breath. Denies any other symptoms.

## 2018-07-28 NOTE — Discharge Instructions (Addendum)
Recommend starting to take a baby aspirin a day.  As we discussed your presentation of chest pain today warrants admission for chest pain rule out.  It is possible he could have a developing heart attack and this could be life-threatening.  You are welcome to return at any point in time.  Make an appointment to follow-up with cardiology.

## 2018-08-08 DIAGNOSIS — Z79891 Long term (current) use of opiate analgesic: Secondary | ICD-10-CM | POA: Diagnosis not present

## 2018-08-08 DIAGNOSIS — Z23 Encounter for immunization: Secondary | ICD-10-CM | POA: Diagnosis not present

## 2018-08-08 DIAGNOSIS — J449 Chronic obstructive pulmonary disease, unspecified: Secondary | ICD-10-CM | POA: Diagnosis not present

## 2018-08-08 DIAGNOSIS — D809 Immunodeficiency with predominantly antibody defects, unspecified: Secondary | ICD-10-CM | POA: Diagnosis not present

## 2018-08-08 DIAGNOSIS — M542 Cervicalgia: Secondary | ICD-10-CM | POA: Diagnosis not present

## 2018-08-08 DIAGNOSIS — F17200 Nicotine dependence, unspecified, uncomplicated: Secondary | ICD-10-CM | POA: Diagnosis not present

## 2018-08-12 DIAGNOSIS — J449 Chronic obstructive pulmonary disease, unspecified: Secondary | ICD-10-CM | POA: Diagnosis not present

## 2018-08-21 DIAGNOSIS — J441 Chronic obstructive pulmonary disease with (acute) exacerbation: Secondary | ICD-10-CM | POA: Diagnosis not present

## 2018-09-10 DIAGNOSIS — J449 Chronic obstructive pulmonary disease, unspecified: Secondary | ICD-10-CM | POA: Diagnosis not present

## 2018-09-12 ENCOUNTER — Other Ambulatory Visit: Payer: Self-pay | Admitting: Acute Care

## 2018-09-12 DIAGNOSIS — F17210 Nicotine dependence, cigarettes, uncomplicated: Principal | ICD-10-CM

## 2018-09-12 DIAGNOSIS — Z122 Encounter for screening for malignant neoplasm of respiratory organs: Secondary | ICD-10-CM

## 2018-09-12 DIAGNOSIS — Z87891 Personal history of nicotine dependence: Secondary | ICD-10-CM

## 2018-09-19 DIAGNOSIS — J441 Chronic obstructive pulmonary disease with (acute) exacerbation: Secondary | ICD-10-CM | POA: Diagnosis not present

## 2018-09-27 DIAGNOSIS — M542 Cervicalgia: Secondary | ICD-10-CM | POA: Diagnosis not present

## 2018-09-27 DIAGNOSIS — J449 Chronic obstructive pulmonary disease, unspecified: Secondary | ICD-10-CM | POA: Diagnosis not present

## 2018-10-07 ENCOUNTER — Ambulatory Visit (HOSPITAL_COMMUNITY): Admission: RE | Admit: 2018-10-07 | Payer: PPO | Source: Ambulatory Visit

## 2018-10-11 DIAGNOSIS — E876 Hypokalemia: Secondary | ICD-10-CM | POA: Diagnosis not present

## 2018-10-11 DIAGNOSIS — J449 Chronic obstructive pulmonary disease, unspecified: Secondary | ICD-10-CM | POA: Diagnosis not present

## 2018-10-11 DIAGNOSIS — E871 Hypo-osmolality and hyponatremia: Secondary | ICD-10-CM | POA: Diagnosis not present

## 2018-10-11 DIAGNOSIS — M79601 Pain in right arm: Secondary | ICD-10-CM | POA: Diagnosis not present

## 2018-10-11 DIAGNOSIS — S42201A Unspecified fracture of upper end of right humerus, initial encounter for closed fracture: Secondary | ICD-10-CM | POA: Diagnosis not present

## 2018-10-11 DIAGNOSIS — J84112 Idiopathic pulmonary fibrosis: Secondary | ICD-10-CM | POA: Diagnosis not present

## 2018-10-11 DIAGNOSIS — E039 Hypothyroidism, unspecified: Secondary | ICD-10-CM | POA: Diagnosis not present

## 2018-10-11 DIAGNOSIS — I350 Nonrheumatic aortic (valve) stenosis: Secondary | ICD-10-CM | POA: Diagnosis not present

## 2018-10-28 DIAGNOSIS — J441 Chronic obstructive pulmonary disease with (acute) exacerbation: Secondary | ICD-10-CM | POA: Diagnosis not present

## 2018-10-28 DIAGNOSIS — F17200 Nicotine dependence, unspecified, uncomplicated: Secondary | ICD-10-CM | POA: Diagnosis not present

## 2018-10-28 DIAGNOSIS — R079 Chest pain, unspecified: Secondary | ICD-10-CM | POA: Diagnosis not present

## 2018-10-28 DIAGNOSIS — F419 Anxiety disorder, unspecified: Secondary | ICD-10-CM | POA: Diagnosis not present

## 2018-10-29 ENCOUNTER — Ambulatory Visit (HOSPITAL_COMMUNITY): Admission: RE | Admit: 2018-10-29 | Payer: PPO | Source: Ambulatory Visit

## 2018-10-29 DIAGNOSIS — F419 Anxiety disorder, unspecified: Secondary | ICD-10-CM | POA: Diagnosis not present

## 2018-10-29 DIAGNOSIS — F17200 Nicotine dependence, unspecified, uncomplicated: Secondary | ICD-10-CM | POA: Diagnosis not present

## 2018-10-29 DIAGNOSIS — R079 Chest pain, unspecified: Secondary | ICD-10-CM | POA: Diagnosis not present

## 2018-10-29 DIAGNOSIS — J441 Chronic obstructive pulmonary disease with (acute) exacerbation: Secondary | ICD-10-CM | POA: Diagnosis not present

## 2018-11-10 DIAGNOSIS — J449 Chronic obstructive pulmonary disease, unspecified: Secondary | ICD-10-CM | POA: Diagnosis not present

## 2018-12-03 ENCOUNTER — Ambulatory Visit (HOSPITAL_COMMUNITY): Admission: RE | Admit: 2018-12-03 | Payer: PPO | Source: Ambulatory Visit

## 2018-12-11 DIAGNOSIS — J449 Chronic obstructive pulmonary disease, unspecified: Secondary | ICD-10-CM | POA: Diagnosis not present

## 2019-01-10 DIAGNOSIS — J449 Chronic obstructive pulmonary disease, unspecified: Secondary | ICD-10-CM | POA: Diagnosis not present

## 2019-02-10 DIAGNOSIS — J449 Chronic obstructive pulmonary disease, unspecified: Secondary | ICD-10-CM | POA: Diagnosis not present

## 2019-02-11 DIAGNOSIS — M542 Cervicalgia: Secondary | ICD-10-CM | POA: Diagnosis not present

## 2019-02-11 DIAGNOSIS — F419 Anxiety disorder, unspecified: Secondary | ICD-10-CM | POA: Diagnosis not present

## 2019-02-11 DIAGNOSIS — J449 Chronic obstructive pulmonary disease, unspecified: Secondary | ICD-10-CM | POA: Diagnosis not present

## 2019-02-11 DIAGNOSIS — F17200 Nicotine dependence, unspecified, uncomplicated: Secondary | ICD-10-CM | POA: Diagnosis not present

## 2019-02-12 ENCOUNTER — Other Ambulatory Visit (HOSPITAL_COMMUNITY): Payer: Self-pay | Admitting: Pulmonary Disease

## 2019-02-12 ENCOUNTER — Other Ambulatory Visit: Payer: Self-pay | Admitting: Pulmonary Disease

## 2019-02-12 DIAGNOSIS — M542 Cervicalgia: Secondary | ICD-10-CM

## 2019-02-18 ENCOUNTER — Ambulatory Visit (HOSPITAL_COMMUNITY): Payer: PPO

## 2019-02-21 ENCOUNTER — Other Ambulatory Visit: Payer: Self-pay

## 2019-02-21 ENCOUNTER — Ambulatory Visit (HOSPITAL_COMMUNITY)
Admission: RE | Admit: 2019-02-21 | Discharge: 2019-02-21 | Disposition: A | Discharge status: Home / Self Care | Payer: PPO | Source: Ambulatory Visit | Attending: Pulmonary Disease | Admitting: Pulmonary Disease

## 2019-02-21 DIAGNOSIS — M542 Cervicalgia: Secondary | ICD-10-CM | POA: Diagnosis not present

## 2019-03-04 ENCOUNTER — Other Ambulatory Visit: Payer: Self-pay | Admitting: Pulmonary Disease

## 2019-03-04 DIAGNOSIS — M542 Cervicalgia: Secondary | ICD-10-CM

## 2019-03-06 ENCOUNTER — Ambulatory Visit
Admission: RE | Admit: 2019-03-06 | Discharge: 2019-03-06 | Disposition: A | Discharge status: Home / Self Care | Payer: PPO | Source: Ambulatory Visit | Attending: Pulmonary Disease | Admitting: Pulmonary Disease

## 2019-03-06 ENCOUNTER — Other Ambulatory Visit: Payer: Self-pay

## 2019-03-06 DIAGNOSIS — M542 Cervicalgia: Secondary | ICD-10-CM

## 2019-03-06 DIAGNOSIS — M25511 Pain in right shoulder: Secondary | ICD-10-CM | POA: Diagnosis not present

## 2019-03-06 MED ORDER — TRIAMCINOLONE ACETONIDE 40 MG/ML IJ SUSP (RADIOLOGY)
60.0000 mg | Freq: Once | INTRAMUSCULAR | Status: AC
  Administered 2019-03-06: 12:00:00 60 mg via EPIDURAL

## 2019-03-06 MED ORDER — IOPAMIDOL (ISOVUE-M 300) INJECTION 61%
1.0000 mL | Freq: Once | INTRAMUSCULAR | Status: AC | PRN
  Administered 2019-03-06: 1 mL via EPIDURAL

## 2019-03-06 NOTE — Discharge Instructions (Signed)

## 2019-03-13 DIAGNOSIS — J449 Chronic obstructive pulmonary disease, unspecified: Secondary | ICD-10-CM | POA: Diagnosis not present

## 2019-03-14 DIAGNOSIS — M542 Cervicalgia: Secondary | ICD-10-CM | POA: Diagnosis not present

## 2019-03-14 DIAGNOSIS — J449 Chronic obstructive pulmonary disease, unspecified: Secondary | ICD-10-CM | POA: Diagnosis not present

## 2019-03-14 DIAGNOSIS — M545 Low back pain: Secondary | ICD-10-CM | POA: Diagnosis not present

## 2019-03-14 DIAGNOSIS — Z23 Encounter for immunization: Secondary | ICD-10-CM | POA: Diagnosis not present

## 2019-03-14 DIAGNOSIS — F419 Anxiety disorder, unspecified: Secondary | ICD-10-CM | POA: Diagnosis not present

## 2019-03-17 ENCOUNTER — Other Ambulatory Visit: Payer: Self-pay | Admitting: Pulmonary Disease

## 2019-03-17 ENCOUNTER — Other Ambulatory Visit (HOSPITAL_COMMUNITY): Payer: Self-pay | Admitting: *Deleted

## 2019-03-17 ENCOUNTER — Encounter (HOSPITAL_COMMUNITY): Payer: Self-pay | Admitting: *Deleted

## 2019-03-17 DIAGNOSIS — M542 Cervicalgia: Secondary | ICD-10-CM

## 2019-03-17 DIAGNOSIS — Z122 Encounter for screening for malignant neoplasm of respiratory organs: Secondary | ICD-10-CM

## 2019-03-17 DIAGNOSIS — Z87891 Personal history of nicotine dependence: Secondary | ICD-10-CM

## 2019-03-17 NOTE — Progress Notes (Signed)
Patient referred to lung cancer screening program. Patient has already been in the program with Eric Form, NP in [place] but patient wishes to be transferred here.  Patient does not need a shared decision making visit as it was completed on 08/22/2017.  CT scan scheduled for patient on 9/24@ 1700.  Patient aware of how he will receive test results and his mychart was updated.

## 2019-03-25 ENCOUNTER — Inpatient Hospital Stay: Admission: RE | Admit: 2019-03-25 | Payer: PPO | Source: Ambulatory Visit

## 2019-03-27 ENCOUNTER — Ambulatory Visit (HOSPITAL_COMMUNITY)
Admission: RE | Admit: 2019-03-27 | Discharge: 2019-03-27 | Disposition: A | Discharge status: Home / Self Care | Payer: PPO | Source: Ambulatory Visit | Attending: Hematology | Admitting: Hematology

## 2019-03-27 ENCOUNTER — Other Ambulatory Visit: Payer: Self-pay

## 2019-03-27 DIAGNOSIS — F17210 Nicotine dependence, cigarettes, uncomplicated: Secondary | ICD-10-CM | POA: Diagnosis not present

## 2019-03-27 DIAGNOSIS — Z122 Encounter for screening for malignant neoplasm of respiratory organs: Secondary | ICD-10-CM | POA: Diagnosis not present

## 2019-03-27 DIAGNOSIS — Z87891 Personal history of nicotine dependence: Secondary | ICD-10-CM | POA: Diagnosis not present

## 2019-04-01 ENCOUNTER — Telehealth (HOSPITAL_COMMUNITY): Payer: Self-pay | Admitting: *Deleted

## 2019-04-01 NOTE — Telephone Encounter (Signed)
Patient called for his LDCT results.  The results have still not been entered into Epic.  I have called [facility] to try and get this resolved.  I advised patient that I will call him as soon as I see the results.

## 2019-04-02 ENCOUNTER — Encounter (HOSPITAL_COMMUNITY): Payer: Self-pay | Admitting: *Deleted

## 2019-04-02 ENCOUNTER — Other Ambulatory Visit: Payer: Self-pay | Admitting: Pulmonary Disease

## 2019-04-02 ENCOUNTER — Ambulatory Visit
Admission: RE | Admit: 2019-04-02 | Discharge: 2019-04-02 | Disposition: A | Discharge status: Home / Self Care | Payer: PPO | Source: Ambulatory Visit | Attending: Pulmonary Disease | Admitting: Pulmonary Disease

## 2019-04-02 ENCOUNTER — Other Ambulatory Visit: Payer: Self-pay

## 2019-04-02 DIAGNOSIS — F17200 Nicotine dependence, unspecified, uncomplicated: Secondary | ICD-10-CM

## 2019-04-02 DIAGNOSIS — M542 Cervicalgia: Secondary | ICD-10-CM

## 2019-04-02 MED ORDER — DEXAMETHASONE SODIUM PHOSPHATE 4 MG/ML IJ SOLN
4.0000 mg | Freq: Once | INTRAMUSCULAR | Status: AC
  Administered 2019-04-02: 4 mg via INTRA_ARTICULAR

## 2019-04-02 MED ORDER — IOPAMIDOL (ISOVUE-M 300) INJECTION 61%
1.0000 mL | Freq: Once | INTRAMUSCULAR | Status: AC
  Administered 2019-04-02: 1 mL via INTRA_ARTICULAR

## 2019-04-02 NOTE — Progress Notes (Signed)
Results of CT scan were finalized today. Patient was called with the results.  He was advised to follow up with his primary care physician.  He reported to me some chest pain and said that with nitroglycerin it gets better.  I advised him again to follow up with his primary care as soon as possible. I told him that if he had CP again to not ignore it and go to the ER for evaluation. He verbalizes understanding.  I notified Dr. Luan Pulling office of the results, a copy was faxed to them, and they were asked to follow up with patient regarding the incidental findings on the CT exam.  Patient was mailed a copy of the results as well.  He was advised that we will follow up with him in 12 months for next low dose CT scan for lung cancer screening.     IMPRESSION: 1. Lung-RADS 2S, benign appearance or behavior. Continue annual screening with low-dose chest CT without contrast in 12 months. 2. The "S" modifier above refers to potentially clinically significant non lung cancer related findings. Specifically, there is aortic atherosclerosis, in addition to left main and 3 vessel coronary artery disease. Please note that although the presence of coronary artery calcium documents the presence of coronary artery disease, the severity of this disease and any potential stenosis cannot be assessed on this non-gated CT examination. Assessment for potential risk factor modification, dietary therapy or pharmacologic therapy may be warranted, if clinically indicated. 3. Mild diffuse bronchial wall thickening with mild centrilobular and paraseptal emphysema; imaging findings suggestive of underlying COPD.  Aortic Atherosclerosis (ICD10-I70.0) and Emphysema (ICD10-J43.9).

## 2019-04-02 NOTE — Discharge Instructions (Signed)

## 2019-04-03 ENCOUNTER — Emergency Department (HOSPITAL_COMMUNITY): Payer: PPO

## 2019-04-03 ENCOUNTER — Emergency Department (HOSPITAL_COMMUNITY)
Admission: EM | Admit: 2019-04-03 | Discharge: 2019-04-03 | Disposition: A | Discharge status: Home / Self Care | Payer: PPO | Attending: Emergency Medicine | Admitting: Emergency Medicine

## 2019-04-03 ENCOUNTER — Encounter (HOSPITAL_COMMUNITY): Payer: Self-pay

## 2019-04-03 ENCOUNTER — Other Ambulatory Visit: Payer: Self-pay

## 2019-04-03 DIAGNOSIS — I10 Essential (primary) hypertension: Secondary | ICD-10-CM | POA: Diagnosis not present

## 2019-04-03 DIAGNOSIS — M542 Cervicalgia: Secondary | ICD-10-CM | POA: Diagnosis not present

## 2019-04-03 DIAGNOSIS — I2510 Atherosclerotic heart disease of native coronary artery without angina pectoris: Secondary | ICD-10-CM | POA: Insufficient documentation

## 2019-04-03 DIAGNOSIS — F17210 Nicotine dependence, cigarettes, uncomplicated: Secondary | ICD-10-CM | POA: Diagnosis not present

## 2019-04-03 DIAGNOSIS — Z7982 Long term (current) use of aspirin: Secondary | ICD-10-CM | POA: Insufficient documentation

## 2019-04-03 DIAGNOSIS — R072 Precordial pain: Secondary | ICD-10-CM | POA: Diagnosis not present

## 2019-04-03 DIAGNOSIS — Z79899 Other long term (current) drug therapy: Secondary | ICD-10-CM | POA: Diagnosis not present

## 2019-04-03 DIAGNOSIS — J449 Chronic obstructive pulmonary disease, unspecified: Secondary | ICD-10-CM | POA: Diagnosis not present

## 2019-04-03 DIAGNOSIS — R079 Chest pain, unspecified: Secondary | ICD-10-CM | POA: Insufficient documentation

## 2019-04-03 LAB — CBC
HCT: 45.6 % (ref 39.0–52.0)
Hemoglobin: 14.8 g/dL (ref 13.0–17.0)
MCH: 30.3 pg (ref 26.0–34.0)
MCHC: 32.5 g/dL (ref 30.0–36.0)
MCV: 93.3 fL (ref 80.0–100.0)
Platelets: 200 10*3/uL (ref 150–400)
RBC: 4.89 MIL/uL (ref 4.22–5.81)
RDW: 12.2 % (ref 11.5–15.5)
WBC: 12 10*3/uL — ABNORMAL HIGH (ref 4.0–10.5)
nRBC: 0 % (ref 0.0–0.2)

## 2019-04-03 LAB — BASIC METABOLIC PANEL
Anion gap: 10 (ref 5–15)
BUN: 10 mg/dL (ref 6–20)
CO2: 23 mmol/L (ref 22–32)
Calcium: 8.9 mg/dL (ref 8.9–10.3)
Chloride: 108 mmol/L (ref 98–111)
Creatinine, Ser: 0.78 mg/dL (ref 0.61–1.24)
GFR calc Af Amer: >60 mL/min (ref >60)
GFR calc non Af Amer: >60 mL/min (ref >60)
Glucose, Bld: 91 mg/dL (ref 70–99)
Potassium: 3.3 mmol/L — ABNORMAL LOW (ref 3.5–5.1)
Sodium: 141 mmol/L (ref 135–145)

## 2019-04-03 LAB — TROPONIN I (HIGH SENSITIVITY)
Troponin I (High Sensitivity): 11 ng/L (ref <18)
Troponin I (High Sensitivity): 11 ng/L (ref <18)

## 2019-04-03 MED ORDER — ASPIRIN 81 MG PO CHEW
243.0000 mg | CHEWABLE_TABLET | Freq: Once | ORAL | Status: AC
  Administered 2019-04-03: 243 mg via ORAL

## 2019-04-03 MED ORDER — OXYCODONE HCL 5 MG PO TABS
15.0000 mg | ORAL_TABLET | Freq: Once | ORAL | Status: AC
  Administered 2019-04-03: 11:00:00 15 mg via ORAL
  Filled 2019-04-03: qty 3

## 2019-04-03 MED ORDER — MORPHINE SULFATE (PF) 4 MG/ML IV SOLN
4.0000 mg | Freq: Once | INTRAVENOUS | Status: AC
  Administered 2019-04-03: 4 mg via INTRAVENOUS
  Filled 2019-04-03: qty 1

## 2019-04-03 MED ORDER — NITROGLYCERIN 0.4 MG SL SUBL
0.4000 mg | SUBLINGUAL_TABLET | SUBLINGUAL | Status: DC | PRN
  Administered 2019-04-03 (×2): 0.4 mg via SUBLINGUAL
  Filled 2019-04-03: qty 1

## 2019-04-03 MED ORDER — ASPIRIN 81 MG PO CHEW
324.0000 mg | CHEWABLE_TABLET | Freq: Once | ORAL | Status: DC
  Filled 2019-04-03: qty 4

## 2019-04-03 NOTE — ED Provider Notes (Signed)
[facility] [place] EMERGENCY DEPARTMENT Provider Note   CSN: [id number] Arrival date & time: 04/03/19  0830     History   Chief Complaint Chief Complaint  Patient presents with  . Chest Pain    HPI [name] Carter is a 58 y.o. male.  He has a history of COPD and chronic neck and back pain and is on opiates for that.He is complaining of on and off chest pain for few weeks that became more severe yesterday and kept him up all night.  Unclear if the neck pain radiates into the chest or the chest pain radiates into the neck.  He rates both of them as severe.  He said he tried a nitro with some relief yesterday but that was his last nitro.  He also tried his narcotic pain medicine that did not help.  He feels short of breath and said he is been sweaty at times.  He said he had a CAT scan about 1 week ago that commented upon calcifications and is coronary arteries and aorta.  He had a cath 1 year ago that did not show any critical disease.     The history is provided by the patient.  Chest Pain Pain location:  Substernal area Pain quality: aching   Pain radiates to:  Neck Pain severity:  Severe Onset quality:  Gradual Timing:  Intermittent Progression:  Worsening Chronicity:  Recurrent Relieved by:  Nothing Worsened by:  Nothing Ineffective treatments:  Nitroglycerin Associated symptoms: back pain, diaphoresis and shortness of breath   Associated symptoms: no abdominal pain, no altered mental status, no cough, no fever, no headache, no lower extremity edema, no nausea and no vomiting   Risk factors: male sex and smoking   Risk factors: no diabetes mellitus, no high cholesterol, no hypertension and no prior DVT/PE     Past Medical History:  Diagnosis Date  . Chronic back pain   . COPD (chronic obstructive pulmonary disease) ([place])   . DDD (degenerative disc disease), cervical   . DDD (degenerative disc disease), lumbar   . HTN (hypertension)   . Neck pain   . Pneumonia     Patient  Active Problem List   Diagnosis Date Noted  . CAD in native artery 11/07/2017  . Hypokalemia 09/18/2017  . Hyperglycemia 09/17/2017  . Abnormal EKG 09/17/2017  . 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 GERD (gastroesophageal reflux disease) 05/20/2015  . Community acquired pneumonia 04/25/2015  . Abdominal pain, epigastric 02/12/2015  . LLQ pain 02/12/2015  . Gastritis and gastroduodenitis 02/12/2015  . Constipation 02/12/2015  . COPD exacerbation ([place]) 08/20/2014  . COPD with acute exacerbation ([place]) 06/27/2014  . Chronic pain 06/27/2014  . Acute respiratory failure with hypoxia ([place]) 06/13/2014  . CAP (community acquired pneumonia) 05/16/2014  . Chest pain   . Acute respiratory failure ([place]) 05/13/2014  . Tobacco dependence 05/13/2014    Past Surgical History:  Procedure Laterality Date  . BACK SURGERY     low back x4  . BILATERAL CARPAL TUNNEL RELEASE    . bilteral knee surgery    . COLONOSCOPY  07/2012   [place] MD: MAC. Random colon biopsies benign. Rectal polyp tubular adenoma.  . ESOPHAGOGASTRODUODENOSCOPY  07/2012   [place] MD: MAC. Chronic gastritis and intestinal metaplasia, ?metaplastic atrophic gastritis, negative H.pylori, no celiac, duidnal mucosa with mild Brunner gland hyperplasia, nonspecific.   [place] Kitchen LEFT HEART CATH AND CORONARY ANGIOGRAPHY N/A 11/07/2017   Procedure: LEFT HEART CATH AND CORONARY ANGIOGRAPHY;  Surgeon: Daneen Schick  W, MD;  Location: [facility];  Service: Cardiovascular;  Laterality: N/A;  . ULNAR NERVE TRANSPOSITION     ? by description, not clear        Home Medications    Prior to Admission medications   Medication Sig Start Date End Date Taking? Authorizing Provider  albuterol (PROVENTIL HFA;VENTOLIN HFA) 108 (90 BASE) MCG/ACT inhaler Inhale 2 puffs into the lungs every 4 (four) hours as needed for wheezing or shortness of breath. 08/21/14   Samuella Cota, MD  albuterol (PROVENTIL) (2.5 MG/3ML) 0.083% nebulizer  solution Take 3 mLs (2.5 mg total) by nebulization every 6 (six) hours as needed for wheezing or shortness of breath. 09/18/17   Sinda Du, MD  aspirin EC 81 MG tablet Take 1 tablet (81 mg total) by mouth daily. 09/21/17   Arnoldo Lenis, MD  metoprolol succinate (TOPROL XL) 25 MG 24 hr tablet Take 0.5 tablets (12.5 mg total) by mouth daily. 11/02/17   Strader, Fransisco Hertz, PA-C  nitroGLYCERIN (NITROSTAT) 0.4 MG SL tablet Place 1 tablet (0.4 mg total) under the tongue every 5 (five) minutes as needed for chest pain. 11/02/17 01/31/18  Erma Heritage, PA-C    Family History Family History  Problem Relation Age of Onset  . Heart attack Father   . Colon cancer Father        diagnosed in his 81s  . Heart attack Mother     Social History Social History   Tobacco Use  . Smoking status: Heavy Tobacco Smoker    Packs/day: 2.00    Years: 41.00    Pack years: 82.00    Types: Cigarettes    Start date: 22  . Smokeless tobacco: Never Used  . Tobacco comment: one pack daily  Substance Use Topics  . Alcohol use: No    Alcohol/week: 0.0 standard drinks  . Drug use: No     Allergies   Vicodin [hydrocodone-acetaminophen]   Review of Systems Review of Systems  Constitutional: Positive for diaphoresis. Negative for fever.  HENT: Negative for sore throat.   Eyes: Negative for visual disturbance.  Respiratory: Positive for shortness of breath. Negative for cough.   Cardiovascular: Positive for chest pain.  Gastrointestinal: Negative for abdominal pain, nausea and vomiting.  Genitourinary: Negative for dysuria.  Musculoskeletal: Positive for back pain and neck pain.  Skin: Negative for rash.  Neurological: Negative for headaches.     Physical Exam Updated Vital Signs BP 126/89 Comment: Simultaneous filing. User may not have seen previous data.  Pulse (!) 58   Temp 98.3 F (36.8 C) (Oral)   Resp 14   SpO2 100%   Physical Exam Vitals signs and nursing note reviewed.   Constitutional:      Appearance: He is well-developed.  HENT:     Head: Normocephalic and atraumatic.  Eyes:     Conjunctiva/sclera: Conjunctivae normal.  Neck:     Musculoskeletal: Neck supple.  Cardiovascular:     Rate and Rhythm: Normal rate and regular rhythm.     Heart sounds: Normal heart sounds. No murmur.  Pulmonary:     Effort: Pulmonary effort is normal. No respiratory distress.     Breath sounds: Normal breath sounds.  Abdominal:     Palpations: Abdomen is soft.     Tenderness: There is no abdominal tenderness.  Musculoskeletal:     Right lower leg: He exhibits no tenderness. No edema.     Left lower leg: He exhibits no tenderness. No edema.  Skin:    General: Skin is warm and dry.     Capillary Refill: Capillary refill takes less than 2 seconds.  Neurological:     General: No focal deficit present.     Mental Status: He is alert and oriented to person, place, and time.      ED Treatments / Results  Labs (all labs ordered are listed, but only abnormal results are displayed) Labs Reviewed  BASIC METABOLIC PANEL - Abnormal; Notable for the following components:      Result Value   Potassium 3.3 (*)    All other components within normal limits  CBC - Abnormal; Notable for the following components:   WBC 12.0 (*)    All other components within normal limits  TROPONIN I (HIGH SENSITIVITY)  TROPONIN I (HIGH SENSITIVITY)    EKG EKG Interpretation  Date/Time:  Thursday April 03 2019 08:47:36 EDT Ventricular Rate:  67 PR Interval:    QRS Duration: 89 QT Interval:  394 QTC Calculation: 416 R Axis:   -51 Text Interpretation:  Sinus rhythm LAD, consider left anterior fascicular block similar to prior 1/20 Confirmed by Aletta Edouard [phone]) on 04/03/2019 8:49:48 AM   Radiology Dg Chest Port 1 View  Result Date: 04/03/2019 CLINICAL DATA:  Intermittent chest pain for 2 months. EXAM: PORTABLE CHEST 1 VIEW COMPARISON:  None. FINDINGS: The heart size and  mediastinal contours are within normal limits. Both lungs are clear. The visualized skeletal structures are unremarkable. IMPRESSION: No active disease. Electronically Signed   By: Zetta Bills M.D.   On: 04/03/2019 09:14   Dg Facet Jt Inj C/t Single Level Right W/fl/ct  Result Date: 04/02/2019 CLINICAL DATA:  Cervical spondylosis without myelopathy. Neck and shoulder pain greater on the right. The patient reports no significant improvement following the epidural injection on 03/06/2019. 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 EXAM: RIGHT C4-5 CERVICAL FACET INJECTION Informed written consent was obtained. A time-out was performed. An appropriate skin entry site was chosen, prepped and draped in the usual sterile fashion, and anesthetized with 1% lidocaine. A posterior approach was taken to the right C4-5 facet joint using a 3.5 inch 25 gauge spinal needle. Multiple attempts were made, however the needle could not be successfully advanced into the joint itself, and a periarticular injection was performed instead. Injection of a small amount of Isovue-M 300 demonstrated periarticular spread posterior to the right C4-5 facet joint. There was no vascular uptake. 4 mg of Decadron mixed with a few drops of 1% lidocaine were instilled around the joint. The procedure was well-tolerated. The patient was discharged twenty minutes following the injection in good condition. FLUOROSCOPY TIME:  Fluoroscopy Time: 47 seconds Radiation Exposure Index: 61.32 microGray*m^2 IMPRESSION: Technically successful right C4-5 facet injection as detailed above. Electronically Signed   By: Logan Bores M.D.   On: 04/02/2019 13:23    Procedures Procedures (including critical care time)  Medications Ordered in ED Medications  morphine 4 MG/ML injection 4 mg (4 mg Intravenous Given 04/03/19 0856)  aspirin chewable tablet 243 mg (243 mg Oral Given 04/03/19 0900)   oxyCODONE (Oxy IR/ROXICODONE) immediate release tablet 15 mg (15 mg Oral Given 04/03/19 1049)     Initial Impression / Assessment and Plan / ED Course  I have reviewed the triage vital signs and the nursing notes.  Pertinent labs & imaging results that were available during my care of the patient were reviewed by me and  considered in my medical decision making (see chart for details).  Clinical Course as of Apr 02 1638  Thu Apr 03, 2019  0843 Cath 5/19 -  Non-stenotic 2nd RPLB lesion.    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  Right dominant coronary artery with no evidence of obstructive disease.  Normal left main with possibly 20 to 30% proximal narrowing.  LAD is widely patent with eccentric 30% narrowing after the first diagonal.  First diagonal contains eccentric 60% narrowing.  Angiographically normal circumflex coronary artery giving origin to 3 small obtuse marginal branches  The left ventricular cavity is normal in size.  No regional wall motion abnormalities noted.  EF is 60%.   [MB]  6144 CT Chest 8/20 - MPRESSION: 1. Lung-RADS 2S, benign appearance or behavior. Continue annual screening with low-dose chest CT without contrast in 12 months. 2. The "S" modifier above refers to potentially clinically significant non lung cancer related findings. Specifically, there is aortic atherosclerosis, in addition to left main and 3 vessel coronary artery disease. Please note that although the presence of coronary artery calcium documents the presence of coronary artery disease, the severity of this disease and any potential stenosis cannot be assessed on this non-gated CT examination. Assessment for potential risk factor modification, dietary therapy or pharmacologic therapy may be warranted, if clinically  indicated. 3. Mild diffuse bronchial wall thickening with mild centrilobular and paraseptal emphysema; imaging findings suggestive of underlying COPD.   [MB]  5515 58 year old male with chronic neck and back issues here with on and off chest pain this week worsening since yesterday.  He had a neck facet injection yesterday and he is complaining of severe chest pain and neck pain that was not responsive to his nitroglycerin or his oral narcotics.  Differential includes ACS, radiculopathy, musculoskeletal pain, pneumothorax, PE, vascular.  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   [MB]  [phone] He is getting some aspirin and nitro but have also ordered him some morphine as there definitely seems to be a pain component to his complaints.   [MB]  0086 Patient's initial work-up is fairly unremarkable with a troponin of 11.  He is now asking for his oral pain medication to help with his neck pain.   [MB]  1145 Delta troponin is unchanged.  Will reevaluate patient for disposition.   [MB]  1201 Patient states his pain is improved after medications he is received.  He Becomes tearful when he talks about the stressors in his life.  He understands he needs to follow-up with cardiology and also given the number for mental health resources.  Clear return instructions given.   [MB]    Clinical Course User Index [MB] Hayden Rasmussen, MD        Final Clinical Impressions(s) / ED Diagnoses   Final diagnoses:  Nonspecific chest pain  Neck pain    ED Discharge Orders    None       Hayden Rasmussen, MD 04/03/19 1640

## 2019-04-03 NOTE — Discharge Instructions (Signed)
You were seen in the emergency department for neck and chest pain.  You had blood work EKG and a chest x-ray that did not show any serious findings.  It will be important for you to follow-up with your doctor and also get set up with an appointment for cardiology.  He will likely need further tests.  Please continue an aspirin a day.  Return if any worsening symptoms.

## 2019-04-03 NOTE — ED Triage Notes (Signed)
Pt reports intermittent chest pain x 2 months.  Reports has been taking nitro prn and it usually helps.  PT says is out of nitro.  Reports chest pain and back pain this morning.  Reports numbness in left side of face and arm intermittently as well.  Woke up around 3am with numbness in left side of face.  Pt tearful during exam.

## 2019-04-12 DIAGNOSIS — J449 Chronic obstructive pulmonary disease, unspecified: Secondary | ICD-10-CM | POA: Diagnosis not present

## 2019-04-14 DIAGNOSIS — J449 Chronic obstructive pulmonary disease, unspecified: Secondary | ICD-10-CM | POA: Diagnosis not present

## 2019-04-14 DIAGNOSIS — M545 Low back pain: Secondary | ICD-10-CM | POA: Diagnosis not present

## 2019-04-14 DIAGNOSIS — F17200 Nicotine dependence, unspecified, uncomplicated: Secondary | ICD-10-CM | POA: Diagnosis not present

## 2019-04-14 DIAGNOSIS — M542 Cervicalgia: Secondary | ICD-10-CM | POA: Diagnosis not present

## 2019-04-22 ENCOUNTER — Ambulatory Visit: Payer: PPO | Admitting: Student

## 2019-04-29 DIAGNOSIS — J189 Pneumonia, unspecified organism: Secondary | ICD-10-CM | POA: Diagnosis not present

## 2019-04-29 DIAGNOSIS — Z885 Allergy status to narcotic agent status: Secondary | ICD-10-CM | POA: Diagnosis not present

## 2019-04-29 DIAGNOSIS — I2510 Atherosclerotic heart disease of native coronary artery without angina pectoris: Secondary | ICD-10-CM | POA: Diagnosis not present

## 2019-04-29 DIAGNOSIS — R918 Other nonspecific abnormal finding of lung field: Secondary | ICD-10-CM | POA: Diagnosis not present

## 2019-04-29 DIAGNOSIS — R200 Anesthesia of skin: Secondary | ICD-10-CM | POA: Diagnosis not present

## 2019-04-29 DIAGNOSIS — F17210 Nicotine dependence, cigarettes, uncomplicated: Secondary | ICD-10-CM | POA: Diagnosis not present

## 2019-04-29 DIAGNOSIS — R079 Chest pain, unspecified: Secondary | ICD-10-CM | POA: Diagnosis not present

## 2019-05-07 ENCOUNTER — Telehealth: Payer: Self-pay | Admitting: Student

## 2019-05-07 NOTE — Telephone Encounter (Signed)

## 2019-05-08 ENCOUNTER — Telehealth (INDEPENDENT_AMBULATORY_CARE_PROVIDER_SITE_OTHER): Payer: PPO | Admitting: Student

## 2019-05-08 ENCOUNTER — Encounter: Payer: Self-pay | Admitting: Student

## 2019-05-08 VITALS — Ht 69.0 in | Wt 183.0 lb

## 2019-05-08 DIAGNOSIS — R079 Chest pain, unspecified: Secondary | ICD-10-CM

## 2019-05-08 DIAGNOSIS — R0609 Other forms of dyspnea: Secondary | ICD-10-CM

## 2019-05-08 DIAGNOSIS — I25119 Atherosclerotic heart disease of native coronary artery with unspecified angina pectoris: Secondary | ICD-10-CM

## 2019-05-08 DIAGNOSIS — I10 Essential (primary) hypertension: Secondary | ICD-10-CM

## 2019-05-08 DIAGNOSIS — E785 Hyperlipidemia, unspecified: Secondary | ICD-10-CM

## 2019-05-08 DIAGNOSIS — Z720 Tobacco use: Secondary | ICD-10-CM

## 2019-05-08 NOTE — Progress Notes (Signed)
Virtual Visit via Video Note   This visit type was conducted due to national recommendations for restrictions regarding the COVID-19 Pandemic (e.g. social distancing) in an effort to limit this patient's exposure and mitigate transmission in our community.  Due to his co-morbid illnesses, this patient is at least at moderate risk for complications without adequate follow up.  This format is felt to be most appropriate for this patient at this time.  All issues noted in this document were discussed and addressed.  A limited physical exam was performed with this format.  Please refer to the patient's chart for his consent to telehealth for [facility].   Date:  05/08/2019   ID:  [name], DOB Mar 11, 1961, MRN [id number]  Patient Location: Home Provider Location: Office  PCP:  Sinda Du, MD  Cardiologist:  Carlyle Dolly, MD  Electrophysiologist:  None   Evaluation Performed:  Follow-Up Visit  Chief Complaint:  Recent Chest Pain  History of Present Illness:    [name] Carter is a 58 y.o. 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  He was last examined by myself in 10/2017 following recent abnormal coronary CT and a cardiac catheterization was recommended which showed results as outlined below.  He was continued on ASA 81 mg daily along with Toprol-XL 12.5 mg daily being added to his regimen.  He has not followed up since his catheterization.  In talking with the patient today, he reports being under increased stress as he is currently in the process of going through a divorce and his son died of a drug overdose earlier this year.  Due to the stress of these events, he was smoking 3-4 ppd at times over the past few months (previously smoking 2 ppd since age 53).  He now lives in [place] with his girlfriend and says he was diagnosed with PNA recently and started on antibiotics but did not tolerate these due to GI side effects. He is scheduled to see Dr. Luan Pulling next week for further management. Over the past 2-3 months, he describes episodes of chest pain which can occur at rest or with activity. Most recently his pain has been worse with inspiration and coughing. No specific orthopnea, PND or edema.     Past Medical History:  Diagnosis Date  . Chronic back pain   . COPD (chronic obstructive pulmonary disease) ([place])   . DDD (degenerative disc disease), cervical   . DDD (degenerative disc disease), lumbar   . HTN (hypertension)   . Neck pain   . Pneumonia    Past Surgical History:  Procedure Laterality Date  . BACK SURGERY     low back x4  . BILATERAL CARPAL TUNNEL RELEASE    . bilteral knee surgery    . COLONOSCOPY  07/2012   [place] MD: MAC. Random colon biopsies benign. Rectal polyp tubular adenoma.  . ESOPHAGOGASTRODUODENOSCOPY  07/2012   [place] MD: MAC. Chronic gastritis and intestinal metaplasia, ?metaplastic atrophic gastritis, negative H.pylori, no celiac, duidnal mucosa with mild Brunner gland hyperplasia, nonspecific.   Marland Kitchen LEFT HEART CATH AND CORONARY ANGIOGRAPHY N/A 11/07/2017   Procedure: LEFT HEART CATH AND CORONARY ANGIOGRAPHY;  Surgeon:  Belva Crome, MD;  Location: [facility];  Service: Cardiovascular;  Laterality: N/A;  . ULNAR NERVE TRANSPOSITION     ? by description, not clear     Current Meds  Medication Sig  . albuterol (PROVENTIL HFA;VENTOLIN HFA) 108 (90 BASE) MCG/ACT inhaler Inhale 2 puffs into the lungs every 4 (four) hours as needed for wheezing or shortness of breath.  Marland Kitchen albuterol (PROVENTIL) (2.5 MG/3ML) 0.083% nebulizer solution Take 3 mLs (2.5 mg total) by nebulization every 6  (six) hours as needed for wheezing or shortness of breath.  Marland Kitchen aspirin EC 81 MG tablet Take 1 tablet (81 mg total) by mouth daily.  . diazepam (VALIUM) 5 MG tablet Take 1 tablet by mouth daily.  . diclofenac sodium (VOLTAREN) 1 % GEL   . IBU 800 MG tablet Take 1 tablet by mouth daily.  Marland Kitchen oxyCODONE (ROXICODONE) 15 MG immediate release tablet Take 1 tablet by mouth every 4 (four) hours.  Marland Kitchen umeclidinium-vilanterol (ANORO ELLIPTA) 62.5-25 MCG/INH AEPB Inhale 1 puff into the lungs daily.     Allergies:   Vicodin [hydrocodone-acetaminophen]   Social History   Tobacco Use  . Smoking status: Heavy Tobacco Smoker    Packs/day: 1.00    Years: 41.00    Pack years: 41.00    Types: Cigarettes    Start date: 16  . Smokeless tobacco: Never Used  . Tobacco comment: one pack daily  Substance Use Topics  . Alcohol use: No    Alcohol/week: 0.0 standard drinks  . Drug use: No     Family Hx: The patient's family history includes Colon cancer in his father; Heart attack in his father and mother.  ROS:   Please see the history of present illness.     All other systems reviewed and are negative.   Prior CV studies:   The following studies were reviewed today:  Echocardiogram: 01/2018 Study Conclusions  - Left ventricle: The cavity size was normal. Wall thickness was   normal. Systolic function was normal. The estimated ejection   fraction was in the range of 60% to 65%. Left ventricular   diastolic function parameters were normal.  Cardiac Catheterization: 10/2017  Non-stenotic 2nd RPLB lesion.    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  Right dominant coronary artery with no evidence of obstructive disease.  Normal left main with possibly 20 to 30% proximal narrowing.  LAD is widely patent with eccentric 30%  narrowing after the first diagonal.  First diagonal contains eccentric 60% narrowing.  Angiographically normal circumflex coronary artery giving origin to 3 small obtuse marginal branches  The left ventricular cavity is normal in size.  No regional wall motion abnormalities noted.  EF is 60%.  RECOMMENDATIONS:   Clinical correlation with findings.  No significant CAD angiographically.  Reassess cardiac CT for evidence of cameral fistula as described above.   In absence of heart failure/LV systolic dysfunction and with no evidence of elevated end-diastolic pressure, therapy for the cameral fistula is not felt to be needed.  Should the patient develop LV enlargement, heart failure symptoms, etc, management of the fistula could be with coiling/embolization vs covered stent to occlude or exclude the arterial source originating from a distal RCA LV branch.  Labs/Other Tests and Data Reviewed:    EKG:  An ECG dated 04/03/2019 was personally reviewed today  and demonstrated:  NSR, HR 67 with no acute ST abnormalities.   Recent Labs: 04/03/2019: BUN 10; Creatinine, Ser 0.78; Hemoglobin 14.8; Platelets 200; Potassium 3.3; Sodium 141   Recent Lipid Panel Lab Results  Component Value Date/Time   TRIG 79 04/25/2015 10:08 PM    Wt Readings from Last 3 Encounters:  05/08/19 183 lb (83 kg)  12/17/17 183 lb (83 kg)  11/07/17 184 lb (83.5 kg)     Objective:    Vital Signs:  Ht _0  (1.753 m)   Wt 183 lb (83 kg)   BMI 27.02 kg/m    General: Pleasant male appearing in NAD Psych: Normal affect. Neuro: Alert and oriented X 3. Moves all extremities spontaneously. HEENT: Normal by video assessment Lungs:  Resp regular and unlabored while talking on the phone.   ASSESSMENT & PLAN:    1. 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  - recent Chest CT showed coronary calcifications and I reviewed with the patient that Chest CT's do not distinguish the amount of plaque present. While he does have cardiac risk factors and has been smoking 2-3 ppd, it is unlikely his anatomy has significantly changed within the past year. Recent EKG showed no acute ST changes and troponin values were negative.  - his current symptoms seem most consistent with pleuritic pain in the setting of his COPD and recent PNA. Given his cameral fistula, will obtain an echocardiogram to reassess LV function and wall motion. If no significant findings, would focus on risk factor modification. Continue ASA. Will request recent FLP from PCP as he is not on statin therapy.   2. HTN - he reports SBP was in the 190's when checked at a local hospital in [place] several weeks ago. He believes his girlfriend has a BP cuff and I asked him to follow this and report back on his readings. If he does not have access to a BP cuff, would see if any remain available through Social Work.  - was on Toprol-XL at the time of his last visit but discontinued this in the interim. Pending readings, would consider re-initiation of this or addition of ARB pending improvement in his respiratory status.   3. HLD - he never followed up after his catheterization and is currently not on statin therapy. Will obtain a copy of most recent FLP from his PCP. If not at goal of LDL less than 70, plan to start statin therapy.   4. Tobacco Use - previously smoking 3-4 ppd earlier this year, now at 2 ppd. Continued reduction advised.   COVID-19 Education: The signs and symptoms of COVID-19 were discussed with the patient and how to seek care for testing (follow up with PCP or arrange E-visit).  The importance of social distancing was discussed today.  Time:   Today, I have  spent 23 minutes with the patient with telehealth technology discussing the above problems.     Medication Adjustments/Labs and Tests Ordered: Current medicines are reviewed at length with the patient today.  Concerns regarding medicines are outlined above.   Tests Ordered: Orders Placed This Encounter  Procedures  . ECHOCARDIOGRAM COMPLETE    Medication Changes: No orders of the defined types were  placed in this encounter.   Follow Up:  In Person in 2 month(s)  Signed, Erma Heritage, PA-C  05/08/2019 8:08 PM    [place]

## 2019-05-08 NOTE — Patient Instructions (Signed)
Medication Instructions:  Your physician recommends that you continue on your current medications as directed. Please refer to the Current Medication list given to you today.  *If you need a refill on your cardiac medications before your next appointment, please call your pharmacy*  Lab Work: NONE  If you have labs (blood work) drawn today and your tests are completely normal, you will receive your results only by: Marland Kitchen MyChart Message (if you have MyChart) OR . A paper copy in the mail If you have any lab test that is abnormal or we need to change your treatment, we will call you to review the results.  Testing/Procedures: Your physician has requested that you have an echocardiogram. Echocardiography is a painless test that uses sound waves to create images of your heart. It provides your doctor with information about the size and shape of your heart and how well your heart's chambers and valves are working. This procedure takes approximately one hour. There are no restrictions for this procedure.    Follow-Up: At [facility], you and your health needs are our priority.  As part of our continuing mission to provide you with exceptional heart care, we have created designated Provider Care Teams.  These Care Teams include your primary Cardiologist (physician) and Advanced Practice Providers (APPs -  Physician Assistants and Nurse Practitioners) who all work together to provide you with the care you need, when you need it.  Your next appointment:   2 Months  The format for your next appointment:   In Person  Provider:   Carlyle Dolly, MD  Other Instructions Thank you for choosing [facility]!

## 2019-05-11 DIAGNOSIS — R0689 Other abnormalities of breathing: Secondary | ICD-10-CM | POA: Diagnosis not present

## 2019-05-11 DIAGNOSIS — J441 Chronic obstructive pulmonary disease with (acute) exacerbation: Secondary | ICD-10-CM | POA: Diagnosis not present

## 2019-05-11 DIAGNOSIS — R091 Pleurisy: Secondary | ICD-10-CM | POA: Diagnosis not present

## 2019-05-11 DIAGNOSIS — J449 Chronic obstructive pulmonary disease, unspecified: Secondary | ICD-10-CM | POA: Diagnosis not present

## 2019-05-11 DIAGNOSIS — Z7982 Long term (current) use of aspirin: Secondary | ICD-10-CM | POA: Diagnosis not present

## 2019-05-11 DIAGNOSIS — Z20828 Contact with and (suspected) exposure to other viral communicable diseases: Secondary | ICD-10-CM | POA: Diagnosis not present

## 2019-05-11 DIAGNOSIS — I2510 Atherosclerotic heart disease of native coronary artery without angina pectoris: Secondary | ICD-10-CM | POA: Diagnosis not present

## 2019-05-11 DIAGNOSIS — Z885 Allergy status to narcotic agent status: Secondary | ICD-10-CM | POA: Diagnosis not present

## 2019-05-11 DIAGNOSIS — Z981 Arthrodesis status: Secondary | ICD-10-CM | POA: Diagnosis not present

## 2019-05-11 DIAGNOSIS — F17210 Nicotine dependence, cigarettes, uncomplicated: Secondary | ICD-10-CM | POA: Diagnosis not present

## 2019-05-13 ENCOUNTER — Encounter: Payer: Self-pay | Admitting: *Deleted

## 2019-05-13 DIAGNOSIS — J449 Chronic obstructive pulmonary disease, unspecified: Secondary | ICD-10-CM | POA: Diagnosis not present

## 2019-05-14 ENCOUNTER — Telehealth: Payer: Self-pay | Admitting: Student

## 2019-05-14 DIAGNOSIS — E785 Hyperlipidemia, unspecified: Secondary | ICD-10-CM

## 2019-05-14 NOTE — Telephone Encounter (Addendum)
     Please let the patient know I reviewed the recent labs from his PCP and total cholesterol was at 196 with LDL elevated to 131. Given known coronary plaque by prior catheterization last year, would recommend starting Crestor 20 mg daily as his goal LDL should be less than 70.  Recheck FLP and LFT's in 6-8 weeks.   Signed, Erma Heritage, PA-C 05/14/2019, 11:05 AM Pager: [phone]

## 2019-05-16 ENCOUNTER — Ambulatory Visit (HOSPITAL_COMMUNITY)
Admission: RE | Admit: 2019-05-16 | Discharge: 2019-05-16 | Disposition: A | Discharge status: Home / Self Care | Payer: PPO | Source: Ambulatory Visit | Attending: Student | Admitting: Student

## 2019-05-16 ENCOUNTER — Telehealth: Payer: Self-pay | Admitting: *Deleted

## 2019-05-16 ENCOUNTER — Other Ambulatory Visit: Payer: Self-pay

## 2019-05-16 DIAGNOSIS — R0600 Dyspnea, unspecified: Secondary | ICD-10-CM | POA: Diagnosis not present

## 2019-05-16 DIAGNOSIS — R079 Chest pain, unspecified: Secondary | ICD-10-CM | POA: Insufficient documentation

## 2019-05-16 DIAGNOSIS — F17200 Nicotine dependence, unspecified, uncomplicated: Secondary | ICD-10-CM | POA: Diagnosis not present

## 2019-05-16 DIAGNOSIS — E785 Hyperlipidemia, unspecified: Secondary | ICD-10-CM | POA: Diagnosis not present

## 2019-05-16 DIAGNOSIS — J449 Chronic obstructive pulmonary disease, unspecified: Secondary | ICD-10-CM | POA: Diagnosis not present

## 2019-05-16 DIAGNOSIS — M542 Cervicalgia: Secondary | ICD-10-CM | POA: Diagnosis not present

## 2019-05-16 MED ORDER — ROSUVASTATIN CALCIUM 20 MG PO TABS: 20.0000 mg | ORAL_TABLET | Freq: Every day | ORAL | 11 refills | Status: DC

## 2019-05-16 NOTE — Telephone Encounter (Signed)
Called pt no answer- Unable to leave VM

## 2019-05-16 NOTE — Telephone Encounter (Signed)
Pt notified and voiced understanding 

## 2019-05-16 NOTE — Progress Notes (Signed)
*  PRELIMINARY RESULTS* Echocardiogram 2D Echocardiogram has been performed.  Samuel Germany 05/16/2019, 9:06 AM

## 2019-05-16 NOTE — Telephone Encounter (Signed)
-----   Message from Erma Heritage, [place] sent at 05/16/2019  1:52 PM EST ----- Please let the patient know his echocardiogram showed normal pumping function of the heart with a preserved EF of 55 to 60%. No significant wall motion abnormalities. No significant valve abnormalities. Overall, a very reassuring study.  Please forward a copy of results to Sinda Du, MD.

## 2019-06-11 ENCOUNTER — Encounter (HOSPITAL_COMMUNITY): Payer: Self-pay

## 2019-06-11 ENCOUNTER — Other Ambulatory Visit: Payer: Self-pay

## 2019-06-11 ENCOUNTER — Emergency Department (HOSPITAL_COMMUNITY): Payer: PPO

## 2019-06-11 ENCOUNTER — Emergency Department (HOSPITAL_COMMUNITY)
Admission: EM | Admit: 2019-06-11 | Discharge: 2019-06-12 | Disposition: A | Discharge status: Home / Self Care | Payer: PPO | Attending: Emergency Medicine | Admitting: Emergency Medicine

## 2019-06-11 DIAGNOSIS — F17210 Nicotine dependence, cigarettes, uncomplicated: Secondary | ICD-10-CM | POA: Diagnosis not present

## 2019-06-11 DIAGNOSIS — Z7982 Long term (current) use of aspirin: Secondary | ICD-10-CM | POA: Insufficient documentation

## 2019-06-11 DIAGNOSIS — E876 Hypokalemia: Secondary | ICD-10-CM | POA: Diagnosis not present

## 2019-06-11 DIAGNOSIS — M791 Myalgia, unspecified site: Secondary | ICD-10-CM | POA: Diagnosis not present

## 2019-06-11 DIAGNOSIS — I10 Essential (primary) hypertension: Secondary | ICD-10-CM | POA: Diagnosis not present

## 2019-06-11 DIAGNOSIS — R52 Pain, unspecified: Secondary | ICD-10-CM | POA: Diagnosis not present

## 2019-06-11 DIAGNOSIS — J189 Pneumonia, unspecified organism: Secondary | ICD-10-CM | POA: Diagnosis not present

## 2019-06-11 DIAGNOSIS — M7918 Myalgia, other site: Secondary | ICD-10-CM | POA: Diagnosis present

## 2019-06-11 DIAGNOSIS — Z79899 Other long term (current) drug therapy: Secondary | ICD-10-CM | POA: Diagnosis not present

## 2019-06-11 DIAGNOSIS — J449 Chronic obstructive pulmonary disease, unspecified: Secondary | ICD-10-CM | POA: Insufficient documentation

## 2019-06-11 DIAGNOSIS — Z20828 Contact with and (suspected) exposure to other viral communicable diseases: Secondary | ICD-10-CM | POA: Diagnosis not present

## 2019-06-11 MED ORDER — OXYCODONE HCL 5 MG PO TABS
15.0000 mg | ORAL_TABLET | Freq: Once | ORAL | Status: AC
  Administered 2019-06-11: 15 mg via ORAL
  Filled 2019-06-11: qty 3

## 2019-06-11 NOTE — ED Notes (Signed)
Pt called RN in room asking how much longer; pt informed a new physician will be here soon and he will be seen as soon as possible; pt verbalized understanding

## 2019-06-11 NOTE — ED Triage Notes (Signed)
Pt reports yesterday started not feeling good, body aches, fatigue, that started yesterday. Pt was in hospital on 05/11/2019 in [place] diagnosed with double PNA and tested negative for Covid at that time. Pt said he thought he was getting better, then yesterday started feeling bad again. Pt denies cough, denies SOB

## 2019-06-11 NOTE — ED Provider Notes (Signed)
[facility] EMERGENCY DEPARTMENT Provider Note   CSN: [id number] Arrival date & time: 06/11/19  1943     History   Chief Complaint Chief Complaint  Patient presents with   Generalized Body Aches    weakness    HPI [name] Carter is a 58 y.o. male with PMHx COPD, HTN, DDD cervical and lumbar, with chronic back pain who presents to the ED today complaining of gradual onset, constant, diffuse, body aches that began yesterday but worsened today.  Also complains of chills.  Reports that he was recently diagnosed with pneumonia on 05/11/2019 in [place] and was started on doxycycline.  He states he finished his antibiotics 2 to 3 days ago was feeling improved but then started feeling bad again yesterday.  He states that he was tested for Covid when he was diagnosed with pneumonia which was negative.  He denies any known COVID-19 positive exposure.  He reports he has a history of recurrent pneumonia and states he has been intubated and at least 5 times.  Was not hospitalized with a recent pneumonia diagnosis last month.  Denies cough, shortness of breath, chest pain, leg swelling, palpitations, sore throat, ear pain, any other associated symptoms.  Patient is also currently complaining of his chronic neck pain.  He states he is in the process of getting "3 surgeries to his neck."  He states that for whatever reason the pharmacy has not refilled his chronic pain medication he has been out for 3 to 4 days causing significant pain to his neck.        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   GERD (gastroesophageal reflux disease) 05/20/2015   Community acquired pneumonia 04/25/2015   Abdominal pain, epigastric 02/12/2015   LLQ pain 02/12/2015   Gastritis and gastroduodenitis 02/12/2015   Constipation 02/12/2015   COPD exacerbation ([place]) 08/20/2014   COPD with acute exacerbation ([place]) 06/27/2014   Chronic pain 06/27/2014   Acute respiratory failure with hypoxia ([place]) 06/13/2014   CAP (community acquired pneumonia) 05/16/2014   Chest pain    Acute respiratory failure ([place]) 05/13/2014   Tobacco dependence 05/13/2014    Past Surgical History:  Procedure Laterality Date   BACK SURGERY     low back x4   BILATERAL CARPAL TUNNEL RELEASE     bilteral knee surgery     COLONOSCOPY  07/2012   [place] MD: MAC. Random colon biopsies benign. Rectal polyp tubular adenoma.   ESOPHAGOGASTRODUODENOSCOPY  07/2012   [place] MD: MAC. Chronic gastritis and intestinal metaplasia, ?metaplastic atrophic gastritis, negative H.pylori, no celiac, duidnal mucosa with mild Brunner gland hyperplasia, nonspecific.    LEFT HEART CATH AND CORONARY ANGIOGRAPHY N/A 11/07/2017   Procedure: LEFT HEART CATH AND CORONARY ANGIOGRAPHY;  Surgeon: Belva Crome, MD;  Location: [place] [facility];  Service: Cardiovascular;  Laterality: N/A;   ULNAR NERVE TRANSPOSITION     ? by description, not clear        Home Medications    Prior to Admission medications  Medication Sig Start Date End Date Taking? Authorizing Provider  albuterol (PROVENTIL HFA;VENTOLIN HFA) 108 (90 BASE) MCG/ACT inhaler Inhale 2 puffs into the lungs every 4 (four) hours as needed for wheezing or shortness of breath. 08/21/14   Samuella Cota, MD  albuterol (PROVENTIL) (2.5 MG/3ML) 0.083% nebulizer solution Take 3 mLs (2.5 mg total) by nebulization every 6 (six) hours as needed for wheezing or shortness of breath. 09/18/17   Sinda Du, MD  aspirin EC 81 MG tablet  Take 1 tablet (81 mg total) by mouth daily. 09/21/17   Arnoldo Lenis, MD  diazepam (VALIUM) 5 MG tablet Take 1 tablet by mouth daily. 03/13/19   [provider]  diclofenac sodium (VOLTAREN) 1 % GEL  02/11/19   [provider]  IBU 800 MG tablet Take 1 tablet by mouth daily. 01/02/19   [provider]  oxyCODONE (ROXICODONE) 15 MG immediate release tablet Take 1 tablet by mouth every 4 (four) hours. 03/13/19   [provider]  rosuvastatin (CRESTOR) 20 MG tablet Take 1 tablet (20 mg total) by mouth daily. 05/16/19 08/14/19  Strader, Fransisco Hertz, PA-C  umeclidinium-vilanterol (ANORO ELLIPTA) 62.5-25 MCG/INH AEPB Inhale 1 puff into the lungs daily.    [provider]    Family History Family History  Problem Relation Age of Onset   Heart attack Father    Colon cancer Father        diagnosed in his 38s   Heart attack Mother     Social History Social History   Tobacco Use   Smoking status: Heavy Tobacco Smoker    Packs/day: 1.00    Years: 41.00    Pack years: 41.00    Types: Cigarettes    Start date: 1977   Smokeless tobacco: Never Used   Tobacco comment: one pack daily  Substance Use Topics   Alcohol use: No    Alcohol/week: 0.0 standard drinks   Drug use: No     Allergies   Vicodin [hydrocodone-acetaminophen]   Review of Systems Review of Systems  Constitutional: Positive for chills.  HENT: Negative for congestion and sore throat.   Eyes: Negative for visual disturbance.  Respiratory: Negative for cough and shortness of breath.   Cardiovascular: Negative for leg swelling.  Gastrointestinal: Negative for abdominal pain, nausea and vomiting.  Genitourinary: Negative for difficulty urinating.  Musculoskeletal: Positive for myalgias and neck pain.  Skin: Negative for rash.  Neurological: Negative for headaches.     Physical Exam Updated Vital Signs BP (!) 145/94 (BP Location: Right Arm)    Pulse 93    Temp 98.8 F  (37.1 C) (Oral)    Resp 18    Ht _0  (1.753 m)    Wt 72.6 kg    SpO2 99%    BMI 23.63 kg/m   Physical Exam Vitals signs and nursing note reviewed.  Constitutional:      Appearance: He is not ill-appearing or diaphoretic.  HENT:     Head: Normocephalic and atraumatic.  Eyes:     Conjunctiva/sclera: Conjunctivae normal.  Neck:     Musculoskeletal: Neck supple. No neck rigidity.  Cardiovascular:     Rate and Rhythm: Normal rate and regular rhythm.     Pulses: Normal pulses.  Pulmonary:     Effort: Pulmonary effort is normal.     Breath sounds: Rhonchi present. No wheezing or rales.  Chest:     Chest wall: No tenderness.  Abdominal:     Palpations: Abdomen is  soft.     Tenderness: There is no abdominal tenderness. There is no guarding or rebound.  Skin:    General: Skin is warm and dry.  Neurological:     Mental Status: He is alert.      ED Treatments / Results  Labs (all labs ordered are listed, but only abnormal results are displayed) Labs Reviewed  BASIC METABOLIC PANEL - Abnormal; Notable for the following components:      Result Value   Potassium 3.1 (*)    Glucose, Bld 106 (*)    Calcium 8.7 (*)    All other components within normal limits  CBC WITH DIFFERENTIAL/PLATELET - Abnormal; Notable for the following components:   WBC 11.5 (*)    All other components within normal limits  NOVEL CORONAVIRUS, NAA (HOSP ORDER, SEND-OUT TO REF LAB; TAT 18-24 HRS)  POC SARS CORONAVIRUS 2 AG -  ED    EKG None  Radiology Dg Chest Port 1 View  Result Date: 06/11/2019 CLINICAL DATA:  Recent history of pneumonia EXAM: PORTABLE CHEST 1 VIEW COMPARISON:  04/03/2019 FINDINGS: Cardiac shadows within normal limits. The lungs are well aerated bilaterally. No focal infiltrate or sizable effusion is seen. No bony abnormality is noted. IMPRESSION: No active disease. Electronically Signed   By: Inez Catalina M.D.   On: 06/11/2019 23:47    Procedures Procedures (including critical  care time)  Medications Ordered in ED Medications  potassium chloride SA (KLOR-CON) CR tablet 40 mEq (has no administration in time range)  oxyCODONE (Oxy IR/ROXICODONE) immediate release tablet 15 mg (15 mg Oral Given 06/11/19 2344)     Initial Impression / Assessment and Plan / ED Course  I have reviewed the triage vital signs and the nursing notes.  Pertinent labs & imaging results that were available during my care of the patient were reviewed by me and considered in my medical decision making (see chart for details).  3-year-old male who presents the ED today complaining of body aches and chills.  States that he was recently treated for pneumonia 1 month ago and was Covid negative at that time.  States that he recently finished his doxycycline 2 to 3 days ago.  Unsure if this is correct as patient was diagnosed with pneumonia on 11/8 per him.  Doubt he was on doxycycline for 1 month.  He is also currently  complaining of diffuse neck pain.  History of DDD and does endorse he has been out of his narcotic pain medication for 3 to 4 days.  PDMP reviewed and patient last filled in mid November.  He is requesting something for pain for his neck.  We will give him his usual dose of oxycodone here but will not prescribe outpatient.  Will obtain chest x-ray at this time and basic blood work.  Will swab for Covid.  Arrival patient afebrile without tachycardia or tachypnea.  He is covered in some blankets.  There is some concern for possible withdrawal type symptoms as patient has been out of his medication for 3 to 4 days and is on quite a large dose of oxycodone daily.  point-of-care Covid test negative.  Will send out.  Chest x-ray clear.  CBC with leukocytosis at 11,000 although this does appear to be around patient's baseline.  BMP with potassium 3.1.  Will give K-Dur in the ED.  Have advised increasing potassium in diet and having patient recheck potassium with PCP in 1 to 2 weeks.   Patient stable  for discharge  at this time.  Have advised that he self isolate until he receives his COVID-19 test results.  He is in agreement with plan at this time and stable for discharge home.  Clinical Course as of Jun 11 50  Thu Jun 12, 2019  0033 Baseline  WBC(!): 11.5 [MV]    Clinical Course User Index [MV] Eustaquio Maize, PA-C   [name] Carter was evaluated in Emergency Department on 06/12/2019 for the symptoms described in the history of present illness. He was evaluated in the context of the global COVID-19 pandemic, which necessitated consideration that the patient might be at risk for infection with the SARS-CoV-2 virus that causes COVID-19. Institutional protocols and algorithms that pertain to the evaluation of patients at risk for COVID-19 are in a state of rapid change based on information released by regulatory bodies including the CDC and federal and state organizations. These policies and algorithms were followed during the patient's care in the ED.  This note was prepared using Dragon voice recognition software and may include unintentional dictation errors due to the inherent limitations of voice recognition software.        Final Clinical Impressions(s) / ED Diagnoses   Final diagnoses:  Body aches  Hypokalemia    ED Discharge Orders    None       Eustaquio Maize, PA-C 06/12/19 0051    Fredia Sorrow, MD 06/12/19 [phone]

## 2019-06-12 DIAGNOSIS — F419 Anxiety disorder, unspecified: Secondary | ICD-10-CM | POA: Diagnosis not present

## 2019-06-12 DIAGNOSIS — J440 Chronic obstructive pulmonary disease with acute lower respiratory infection: Secondary | ICD-10-CM | POA: Diagnosis not present

## 2019-06-12 DIAGNOSIS — J449 Chronic obstructive pulmonary disease, unspecified: Secondary | ICD-10-CM | POA: Diagnosis not present

## 2019-06-12 DIAGNOSIS — E785 Hyperlipidemia, unspecified: Secondary | ICD-10-CM | POA: Diagnosis not present

## 2019-06-12 DIAGNOSIS — M4712 Other spondylosis with myelopathy, cervical region: Secondary | ICD-10-CM | POA: Diagnosis not present

## 2019-06-12 LAB — BASIC METABOLIC PANEL
Anion gap: 11 (ref 5–15)
BUN: 8 mg/dL (ref 6–20)
CO2: 24 mmol/L (ref 22–32)
Calcium: 8.7 mg/dL — ABNORMAL LOW (ref 8.9–10.3)
Chloride: 108 mmol/L (ref 98–111)
Creatinine, Ser: 0.71 mg/dL (ref 0.61–1.24)
GFR calc Af Amer: >60 mL/min (ref >60)
GFR calc non Af Amer: >60 mL/min (ref >60)
Glucose, Bld: 106 mg/dL — ABNORMAL HIGH (ref 70–99)
Potassium: 3.1 mmol/L — ABNORMAL LOW (ref 3.5–5.1)
Sodium: 143 mmol/L (ref 135–145)

## 2019-06-12 LAB — CBC WITH DIFFERENTIAL/PLATELET
Abs Immature Granulocytes: 0.03 10*3/uL (ref 0.00–0.07)
Basophils Absolute: 0.1 10*3/uL (ref 0.0–0.1)
Basophils Relative: 0 %
Eosinophils Absolute: 0.2 10*3/uL (ref 0.0–0.5)
Eosinophils Relative: 2 %
HCT: 42.9 % (ref 39.0–52.0)
Hemoglobin: 13.9 g/dL (ref 13.0–17.0)
Immature Granulocytes: 0 %
Lymphocytes Relative: 30 %
Lymphs Abs: 3.4 10*3/uL (ref 0.7–4.0)
MCH: 30.3 pg (ref 26.0–34.0)
MCHC: 32.4 g/dL (ref 30.0–36.0)
MCV: 93.7 fL (ref 80.0–100.0)
Monocytes Absolute: 0.7 10*3/uL (ref 0.1–1.0)
Monocytes Relative: 6 %
Neutro Abs: 7.1 10*3/uL (ref 1.7–7.7)
Neutrophils Relative %: 62 %
Platelets: 247 10*3/uL (ref 150–400)
RBC: 4.58 MIL/uL (ref 4.22–5.81)
RDW: 12.3 % (ref 11.5–15.5)
WBC: 11.5 10*3/uL — ABNORMAL HIGH (ref 4.0–10.5)
nRBC: 0 % (ref 0.0–0.2)

## 2019-06-12 MED ORDER — POTASSIUM CHLORIDE CRYS ER 20 MEQ PO TBCR
40.0000 meq | EXTENDED_RELEASE_TABLET | Freq: Once | ORAL | Status: DC
  Filled 2019-06-12: qty 2

## 2019-06-12 NOTE — Discharge Instructions (Addendum)
Your chest xray and labwork were reassuring today. Your potassium was mildly low at 3.1 (normal 3.5). We have repleted this in the ED tonight. Please have your PCP recheck this value in 1 week. We have swabbed you for COVID 19 - please stay home and self isolate until you receive your results. If negative you may return to normal daily activity. If positive you will need to stay home and self isolate for 14 days starting today (Cleared: 06/26/2019).   Please follow up with your PCP in regards to your ED visit.

## 2019-06-13 LAB — NOVEL CORONAVIRUS, NAA (HOSP ORDER, SEND-OUT TO REF LAB; TAT 18-24 HRS): SARS-CoV-2, NAA: NOT DETECTED

## 2019-07-11 ENCOUNTER — Telehealth (INDEPENDENT_AMBULATORY_CARE_PROVIDER_SITE_OTHER): Payer: PPO | Admitting: Cardiology

## 2019-07-11 ENCOUNTER — Encounter: Payer: Self-pay | Admitting: Cardiology

## 2019-07-11 VITALS — Ht 69.0 in

## 2019-07-11 DIAGNOSIS — I2510 Atherosclerotic heart disease of native coronary artery without angina pectoris: Secondary | ICD-10-CM | POA: Diagnosis not present

## 2019-07-11 DIAGNOSIS — R5383 Other fatigue: Secondary | ICD-10-CM | POA: Diagnosis not present

## 2019-07-11 DIAGNOSIS — Z79899 Other long term (current) drug therapy: Secondary | ICD-10-CM | POA: Diagnosis not present

## 2019-07-11 DIAGNOSIS — R0789 Other chest pain: Secondary | ICD-10-CM

## 2019-07-11 DIAGNOSIS — Z79891 Long term (current) use of opiate analgesic: Secondary | ICD-10-CM | POA: Diagnosis not present

## 2019-07-11 DIAGNOSIS — E559 Vitamin D deficiency, unspecified: Secondary | ICD-10-CM | POA: Diagnosis not present

## 2019-07-11 DIAGNOSIS — M129 Arthropathy, unspecified: Secondary | ICD-10-CM | POA: Diagnosis not present

## 2019-07-11 MED ORDER — NITROGLYCERIN 0.4 MG SL SUBL: 0.4000 mg | SUBLINGUAL_TABLET | SUBLINGUAL | 3 refills | Status: DC | PRN

## 2019-07-11 NOTE — Patient Instructions (Signed)
Medication Instructions:  Your physician has recommended you make the following change in your medication:   Nitro as needed directed for chest pain    Labwork: None   Testing/Procedures: NONE   Follow-Up: Your physician wants you to follow-up in: 6 Months with Dr. Wyline Mood. You will receive a reminder letter in the mail two months in advance. If you don't receive a letter, please call our office to schedule the follow-up appointment.  Any Other Special Instructions Will Be Listed Below (If Applicable).     If you need a refill on your cardiac medications before your next appointment, please call your pharmacy.  Thank you for choosing [facility]!  Nitroglycerin sublingual tablets What is this medicine? NITROGLYCERIN (nye troe GLI ser in) is a type of vasodilator. It relaxes blood vessels, increasing the blood and oxygen supply to your heart. This medicine is used to relieve chest pain caused by angina. It is also used to prevent chest pain before activities like climbing stairs, going outdoors in cold weather, or sexual activity. This medicine may be used for other purposes; ask your health care provider or pharmacist if you have questions. COMMON BRAND NAME(S): Nitroquick, Nitrostat, Nitrotab What should I tell my health care provider before I take this medicine? They need to know if you have any of these conditions:  anemia  head injury, recent stroke, or bleeding in the brain  liver disease  previous heart attack  an unusual or allergic reaction to nitroglycerin, other medicines, foods, dyes, or preservatives  pregnant or trying to get pregnant  breast-feeding How should I use this medicine? Take this medicine by mouth as needed. At the first sign of an angina attack (chest pain or tightness) place one tablet under your tongue. You can also take this medicine 5 to 10 minutes before an event likely to produce chest pain. Follow the directions on the  prescription label. Let the tablet dissolve under the tongue. Do not swallow whole. Replace the dose if you accidentally swallow it. It will help if your mouth is not dry. Saliva around the tablet will help it to dissolve more quickly. Do not eat or drink, smoke or chew tobacco while a tablet is dissolving. If you are not better within 5 minutes after taking ONE dose of nitroglycerin, call 9-1-1 immediately to seek emergency medical care. Do not take more than 3 nitroglycerin tablets over 15 minutes. If you take this medicine often to relieve symptoms of angina, your doctor or health care professional may provide you with different instructions to manage your symptoms. If symptoms do not go away after following these instructions, it is important to call 9-1-1 immediately. Do not take more than 3 nitroglycerin tablets over 15 minutes. Talk to your pediatrician regarding the use of this medicine in children. Special care may be needed. Overdosage: If you think you have taken too much of this medicine contact a poison control center or emergency room at once. NOTE: This medicine is only for you. Do not share this medicine with others. What if I miss a dose? This does not apply. This medicine is only used as needed. What may interact with this medicine? Do not take this medicine with any of the following medications:  certain migraine medicines like ergotamine and dihydroergotamine (DHE)  medicines used to treat erectile dysfunction like sildenafil, tadalafil, and vardenafil  riociguat This medicine may also interact with the following medications:  alteplase  aspirin  heparin  medicines for high blood pressure  medicines for mental depression  other medicines used to treat angina  phenothiazines like chlorpromazine, mesoridazine, prochlorperazine, thioridazine This list may not describe all possible interactions. Give your health care provider a list of all the medicines, herbs,  non-prescription drugs, or dietary supplements you use. Also tell them if you smoke, drink alcohol, or use illegal drugs. Some items may interact with your medicine. What should I watch for while using this medicine? Tell your doctor or health care professional if you feel your medicine is no longer working. Keep this medicine with you at all times. Sit or lie down when you take your medicine to prevent falling if you feel dizzy or faint after using it. Try to remain calm. This will help you to feel better faster. If you feel dizzy, take several deep breaths and lie down with your feet propped up, or bend forward with your head resting between your knees. You may get drowsy or dizzy. Do not drive, use machinery, or do anything that needs mental alertness until you know how this drug affects you. Do not stand or sit up quickly, especially if you are an older patient. This reduces the risk of dizzy or fainting spells. Alcohol can make you more drowsy and dizzy. Avoid alcoholic drinks. Do not treat yourself for coughs, colds, or pain while you are taking this medicine without asking your doctor or health care professional for advice. Some ingredients may increase your blood pressure. What side effects may I notice from receiving this medicine? Side effects that you should report to your doctor or health care professional as soon as possible:  blurred vision  dry mouth  skin rash  sweating  the feeling of extreme pressure in the head  unusually weak or tired Side effects that usually do not require medical attention (report to your doctor or health care professional if they continue or are bothersome):  flushing of the face or neck  headache  irregular heartbeat, palpitations  nausea, vomiting This list may not describe all possible side effects. Call your doctor for medical advice about side effects. You may report side effects to FDA at 1-800-FDA-1088. Where should I keep my  medicine? Keep out of the reach of children. Store at room temperature between 20 and 25 degrees C (68 and 77 degrees F). Store in [employer]. Protect from light and moisture. Keep tightly closed. Throw away any unused medicine after the expiration date. NOTE: This sheet is a summary. It may not cover all possible information. If you have questions about this medicine, talk to your doctor, pharmacist, or health care provider.  2020 Elsevier/Gold Standard (2013-04-17 17:57:36)

## 2019-07-11 NOTE — Progress Notes (Signed)
Virtual Visit via Telephone Note   This visit type was conducted due to national recommendations for restrictions regarding the COVID-19 Pandemic (e.g. social distancing) in an effort to limit this patient's exposure and mitigate transmission in our community.  Due to his co-morbid illnesses, this patient is at least at moderate risk for complications without adequate follow up.  This format is felt to be most appropriate for this patient at this time.  The patient did not have access to video technology/had technical difficulties with video requiring transitioning to audio format only (telephone).  All issues noted in this document were discussed and addressed.  No physical exam could be performed with this format.  Please refer to the patient's chart for his  consent to telehealth for [facility]   Date:  07/11/2019   ID:  [name] Carter, DOB 1961/01/25, MRN [id number]  Patient Location: Home Provider Location: Home  PCP:  Sinda Du, MD  Cardiologist:  Carlyle Dolly, MD  Electrophysiologist:  None   Evaluation Performed:  Follow-Up Visit  Chief Complaint:  FOllow up  History of Present Illness:    [name] Carter is a 59 y.o. male seen today for follow up of the following medical problems   1. Chest pain - 08/2017 echo LVEF 60-5%, no WMAs - 08/2017 CT chest with aortic atherosclerosis, left main CAD.   - 2 months of chest pain. Pressure mid chest, sharp pain left arm. Numbness in face.  - 7/10 in severity. Mainly occurs with activity. +SOB +fatigue - pain lasts up to 1 hr, numbness can last hours at a time  CAD risk factors: +tobacco, father 42 MI, sister with heart troubles - cannot run on treadmill.    10/2017 coronary CTA IMPRESSION: 1. CT FFR analysis showed significant stenosis in the mid to distal LAD, a cardiac catheterization is recommended.  10/2017 cath LM prox 20-30%, LAD 30%, D1 60%, normal LCX. RCA fistula  -seen 05/2019 with chest pain thought to  be pleuritic from recent admission with pneumonia 05/2019 echo LVEF 74-82%, grade I diasotlic dysfunction  - still chest pains at times. Ongoing for 3 months. Had pneumonia recently in [place], ongoing symptoms for last few months. Diagnosied with pleurisy - midchest Sharp/stabbing pain, worst with exhaling. Typically occurs with stress. Can be constant for several ours, can last entire day.  - +cough, +wheezing. Compliant with inhalers.    2. COPD - previously followed by Dr Luan Pulling, has not established with new provider as of yet  3. Hyperlipidemia - was to start crestor 41m daily    The patient does not have symptoms concerning for COVID-19 infection (fever, chills, cough, or new shortness of breath).    Past Medical History:  Diagnosis Date  . Chronic back pain   . COPD (chronic obstructive pulmonary disease) ([place]   . DDD (degenerative disc disease), cervical   . DDD (degenerative disc disease), lumbar   . HTN (hypertension)   . Neck pain   . Pneumonia    Past Surgical History:  Procedure Laterality Date  . BACK SURGERY     low back x4  . BILATERAL CARPAL TUNNEL RELEASE    . bilteral knee surgery    . COLONOSCOPY  07/2012   [place] MD: MAC. Random colon biopsies benign. Rectal polyp tubular adenoma.  . ESOPHAGOGASTRODUODENOSCOPY  07/2012   [place] MD: MAC. Chronic gastritis and intestinal metaplasia, ?metaplastic atrophic gastritis, negative H.pylori, no celiac, duidnal mucosa with mild Brunner gland hyperplasia, nonspecific.   .Marland KitchenLEFT HEART  CATH AND CORONARY ANGIOGRAPHY N/A 11/07/2017   Procedure: LEFT HEART CATH AND CORONARY ANGIOGRAPHY;  Surgeon: Belva Crome, MD;  Location: [place] [facility];  Service: Cardiovascular;  Laterality: N/A;  . ULNAR NERVE TRANSPOSITION     ? by description, not clear     No outpatient medications have been marked as taking for the 07/11/19 encounter (Appointment) with Arnoldo Lenis, MD.     Allergies:   Vicodin  [hydrocodone-acetaminophen]   Social History   Tobacco Use  . Smoking status: Heavy Tobacco Smoker    Packs/day: 1.00    Years: 41.00    Pack years: 41.00    Types: Cigarettes    Start date: 61  . Smokeless tobacco: Never Used  . Tobacco comment: one pack daily  Substance Use Topics  . Alcohol use: No    Alcohol/week: 0.0 standard drinks  . Drug use: No     Family Hx: The patient's family history includes Colon cancer in his father; Heart attack in his father and mother.  ROS:   Please see the history of present illness.    All other systems reviewed and are negative.   Prior CV studies:   The following studies were reviewed today:  08/2017 echo Study Conclusions  - Left ventricle: The cavity size was normal. Wall thickness was normal. Systolic function was normal. The estimated ejection fraction was in the range of 60% to 65%. Left ventricular diastolic function parameters were normal.   10/2017 coronary CTA 1. Left Main:  No significant stenosis.  2. LAD: Proximal: 0.93, mid: 0.88, distal: 0.78. 3. D1: 0.88. 4. LCX: Proximal LCX: 0.94, distal LCX: 0.79. 5. RCA: No significant stenosis.  IMPRESSION: 1. CT FFR analysis showed significant stenosis in the mid to distal LAD, a cardiac catheterization is recommended.   10/2017 cath  Non-stenotic 2nd RPLB lesion.    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  Right dominant coronary artery with no evidence of obstructive disease.  Normal left main with possibly 20 to 30% proximal narrowing.  LAD is widely patent with eccentric 30% narrowing after the first diagonal.  First diagonal contains eccentric 60% narrowing.  Angiographically normal circumflex coronary artery giving origin to 3 small obtuse marginal branches  The left ventricular  cavity is normal in size.  No regional wall motion abnormalities noted.  EF is 60%.  RECOMMENDATIONS:   Clinical correlation with findings.  No significant CAD angiographically.  Reassess cardiac CT for evidence of cameral fistula as described above.   In absence of heart failure/LV systolic dysfunction and with no evidence of elevated end-diastolic pressure, therapy for the cameral fistula is not felt to be needed.  Should the patient develop LV enlargement, heart failure symptoms, etc, management of the fistula could be with coiling/embolization vs covered stent to occlude or exclude the arterial source originating from a distal RCA LV Kimmie Doren    Labs/Other Tests and Data Reviewed:    EKG:  No ECG reviewed.  Recent Labs: 06/11/2019: BUN 8; Creatinine, Ser 0.71; Hemoglobin 13.9; Platelets 247; Potassium 3.1; Sodium 143   Recent Lipid Panel Lab Results  Component Value Date/Time   TRIG 79 04/25/2015 10:08 PM    Wt Readings from Last 3 Encounters:  06/11/19 160 lb (72.6 kg)  05/08/19 183 lb (83 kg)  12/17/17 183 lb (83 kg)  Objective:    Vital Signs:   Today's Vitals   07/11/19 0833  Height: _0  (1.753 m)   Body mass index is 23.63 kg/m. Normal affect. Normal speech pattern and tone. Comfortable, no apparent distress. No audible signs of SOB or wheezing.   ASSESSMENT & PLAN:    1. CAD/Chest pain - recent chest pains not conisistent with cardiac etiology. Most consistent with COPD, likely some ongoing inflammation from recent pneumonia. Cath just last year without obstructive disease - no further cardiac testing - he will contact us if he would like a referal to a new pulmonologist since Dr Luan Pulling has retired - he has asked for SL NG prn, we will give a Rx    COVID-19 Education: The signs and symptoms of COVID-19 were discussed with the patient and how to seek care for testing (follow up with PCP or arrange E-visit).  The importance of social distancing was  discussed today.  Time:   Today, I have spent 18 minutes with the patient with telehealth technology discussing the above problems.     Medication Adjustments/Labs and Tests Ordered: Current medicines are reviewed at length with the patient today.  Concerns regarding medicines are outlined above.   Tests Ordered: No orders of the defined types were placed in this encounter.   Medication Changes: No orders of the defined types were placed in this encounter.   Follow Up:  Either In Person or Virtual in 6 month(s)  Signed, Carlyle Dolly, MD  07/11/2019 7:37 AM    [place]

## 2019-07-11 NOTE — Addendum Note (Signed)
Addended by: Kerney Elbe on: 07/11/2019 09:24 AM   Modules accepted: Orders

## 2019-07-13 DIAGNOSIS — J449 Chronic obstructive pulmonary disease, unspecified: Secondary | ICD-10-CM | POA: Diagnosis not present

## 2019-07-14 ENCOUNTER — Other Ambulatory Visit: Payer: Self-pay

## 2019-07-14 MED ORDER — NITROGLYCERIN 0.4 MG SL SUBL: 0.4000 mg | SUBLINGUAL_TABLET | SUBLINGUAL | 3 refills | Status: DC | PRN

## 2019-07-14 NOTE — Telephone Encounter (Signed)
refilled NTG per fax request 

## 2019-07-15 ENCOUNTER — Ambulatory Visit: Payer: PPO | Admitting: Cardiology

## 2019-08-08 DIAGNOSIS — M47812 Spondylosis without myelopathy or radiculopathy, cervical region: Secondary | ICD-10-CM | POA: Diagnosis not present

## 2019-08-08 DIAGNOSIS — R209 Unspecified disturbances of skin sensation: Secondary | ICD-10-CM | POA: Diagnosis not present

## 2019-08-08 DIAGNOSIS — Z79899 Other long term (current) drug therapy: Secondary | ICD-10-CM | POA: Diagnosis not present

## 2019-08-08 DIAGNOSIS — G8929 Other chronic pain: Secondary | ICD-10-CM | POA: Diagnosis not present

## 2019-08-13 DIAGNOSIS — J449 Chronic obstructive pulmonary disease, unspecified: Secondary | ICD-10-CM | POA: Diagnosis not present

## 2019-08-29 ENCOUNTER — Ambulatory Visit: Payer: PPO | Admitting: Cardiology

## 2019-08-30 DIAGNOSIS — Z79891 Long term (current) use of opiate analgesic: Secondary | ICD-10-CM | POA: Diagnosis not present

## 2019-08-30 DIAGNOSIS — M5137 Other intervertebral disc degeneration, lumbosacral region: Secondary | ICD-10-CM | POA: Diagnosis not present

## 2019-08-30 DIAGNOSIS — M47812 Spondylosis without myelopathy or radiculopathy, cervical region: Secondary | ICD-10-CM | POA: Diagnosis not present

## 2019-08-30 DIAGNOSIS — Z79899 Other long term (current) drug therapy: Secondary | ICD-10-CM | POA: Diagnosis not present

## 2019-09-10 DIAGNOSIS — J449 Chronic obstructive pulmonary disease, unspecified: Secondary | ICD-10-CM | POA: Diagnosis not present

## 2019-09-27 DIAGNOSIS — Z79891 Long term (current) use of opiate analgesic: Secondary | ICD-10-CM | POA: Diagnosis not present

## 2019-09-27 DIAGNOSIS — G8929 Other chronic pain: Secondary | ICD-10-CM | POA: Diagnosis not present

## 2019-09-27 DIAGNOSIS — Z79899 Other long term (current) drug therapy: Secondary | ICD-10-CM | POA: Diagnosis not present

## 2019-09-27 DIAGNOSIS — M47812 Spondylosis without myelopathy or radiculopathy, cervical region: Secondary | ICD-10-CM | POA: Diagnosis not present

## 2019-09-29 DIAGNOSIS — R05 Cough: Secondary | ICD-10-CM | POA: Diagnosis not present

## 2019-09-29 DIAGNOSIS — E559 Vitamin D deficiency, unspecified: Secondary | ICD-10-CM | POA: Diagnosis not present

## 2019-09-29 DIAGNOSIS — R5383 Other fatigue: Secondary | ICD-10-CM | POA: Diagnosis not present

## 2019-09-29 DIAGNOSIS — Z79899 Other long term (current) drug therapy: Secondary | ICD-10-CM | POA: Diagnosis not present

## 2019-09-29 DIAGNOSIS — Z0000 Encounter for general adult medical examination without abnormal findings: Secondary | ICD-10-CM | POA: Diagnosis not present

## 2019-09-29 DIAGNOSIS — F17210 Nicotine dependence, cigarettes, uncomplicated: Secondary | ICD-10-CM | POA: Diagnosis not present

## 2019-09-29 DIAGNOSIS — Z1159 Encounter for screening for other viral diseases: Secondary | ICD-10-CM | POA: Diagnosis not present

## 2019-09-29 DIAGNOSIS — Z131 Encounter for screening for diabetes mellitus: Secondary | ICD-10-CM | POA: Diagnosis not present

## 2019-09-29 DIAGNOSIS — E7800 Pure hypercholesterolemia, unspecified: Secondary | ICD-10-CM | POA: Diagnosis not present

## 2019-09-29 DIAGNOSIS — M129 Arthropathy, unspecified: Secondary | ICD-10-CM | POA: Diagnosis not present

## 2019-10-11 DIAGNOSIS — J449 Chronic obstructive pulmonary disease, unspecified: Secondary | ICD-10-CM | POA: Diagnosis not present

## 2019-10-22 DIAGNOSIS — Z79899 Other long term (current) drug therapy: Secondary | ICD-10-CM | POA: Diagnosis not present

## 2019-10-22 DIAGNOSIS — M47812 Spondylosis without myelopathy or radiculopathy, cervical region: Secondary | ICD-10-CM | POA: Diagnosis not present

## 2019-10-22 DIAGNOSIS — F17210 Nicotine dependence, cigarettes, uncomplicated: Secondary | ICD-10-CM | POA: Diagnosis not present

## 2019-10-22 DIAGNOSIS — M25512 Pain in left shoulder: Secondary | ICD-10-CM | POA: Diagnosis not present

## 2019-10-22 DIAGNOSIS — Z79891 Long term (current) use of opiate analgesic: Secondary | ICD-10-CM | POA: Diagnosis not present

## 2019-11-10 DIAGNOSIS — J449 Chronic obstructive pulmonary disease, unspecified: Secondary | ICD-10-CM | POA: Diagnosis not present

## 2019-11-16 DIAGNOSIS — M47812 Spondylosis without myelopathy or radiculopathy, cervical region: Secondary | ICD-10-CM | POA: Diagnosis not present

## 2019-11-16 DIAGNOSIS — Z79891 Long term (current) use of opiate analgesic: Secondary | ICD-10-CM | POA: Diagnosis not present

## 2019-11-16 DIAGNOSIS — M25512 Pain in left shoulder: Secondary | ICD-10-CM | POA: Diagnosis not present

## 2019-11-16 DIAGNOSIS — Z79899 Other long term (current) drug therapy: Secondary | ICD-10-CM | POA: Diagnosis not present

## 2019-11-24 DIAGNOSIS — M5137 Other intervertebral disc degeneration, lumbosacral region: Secondary | ICD-10-CM | POA: Diagnosis not present

## 2019-11-24 DIAGNOSIS — M545 Low back pain: Secondary | ICD-10-CM | POA: Diagnosis not present

## 2019-12-02 DIAGNOSIS — Z79891 Long term (current) use of opiate analgesic: Secondary | ICD-10-CM | POA: Diagnosis not present

## 2019-12-02 DIAGNOSIS — M47812 Spondylosis without myelopathy or radiculopathy, cervical region: Secondary | ICD-10-CM | POA: Diagnosis not present

## 2019-12-02 DIAGNOSIS — Z79899 Other long term (current) drug therapy: Secondary | ICD-10-CM | POA: Diagnosis not present

## 2019-12-03 ENCOUNTER — Ambulatory Visit: Payer: PPO | Admitting: Family Medicine

## 2019-12-08 DIAGNOSIS — J449 Chronic obstructive pulmonary disease, unspecified: Secondary | ICD-10-CM | POA: Diagnosis not present

## 2019-12-08 DIAGNOSIS — R110 Nausea: Secondary | ICD-10-CM | POA: Diagnosis not present

## 2019-12-11 DIAGNOSIS — J449 Chronic obstructive pulmonary disease, unspecified: Secondary | ICD-10-CM | POA: Diagnosis not present

## 2020-01-01 DIAGNOSIS — Z79899 Other long term (current) drug therapy: Secondary | ICD-10-CM | POA: Diagnosis not present

## 2020-01-01 DIAGNOSIS — Z79891 Long term (current) use of opiate analgesic: Secondary | ICD-10-CM | POA: Diagnosis not present

## 2020-01-01 DIAGNOSIS — M47812 Spondylosis without myelopathy or radiculopathy, cervical region: Secondary | ICD-10-CM | POA: Diagnosis not present

## 2020-01-10 DIAGNOSIS — J449 Chronic obstructive pulmonary disease, unspecified: Secondary | ICD-10-CM | POA: Diagnosis not present

## 2020-01-30 DIAGNOSIS — M47812 Spondylosis without myelopathy or radiculopathy, cervical region: Secondary | ICD-10-CM | POA: Diagnosis not present

## 2020-01-30 DIAGNOSIS — Z79899 Other long term (current) drug therapy: Secondary | ICD-10-CM | POA: Diagnosis not present

## 2020-02-10 DIAGNOSIS — J449 Chronic obstructive pulmonary disease, unspecified: Secondary | ICD-10-CM | POA: Diagnosis not present

## 2020-02-27 DIAGNOSIS — M47812 Spondylosis without myelopathy or radiculopathy, cervical region: Secondary | ICD-10-CM | POA: Diagnosis not present

## 2020-02-27 DIAGNOSIS — M67912 Unspecified disorder of synovium and tendon, left shoulder: Secondary | ICD-10-CM | POA: Diagnosis not present

## 2020-02-27 DIAGNOSIS — Z79899 Other long term (current) drug therapy: Secondary | ICD-10-CM | POA: Diagnosis not present

## 2020-02-27 DIAGNOSIS — Z79891 Long term (current) use of opiate analgesic: Secondary | ICD-10-CM | POA: Diagnosis not present

## 2020-03-03 ENCOUNTER — Encounter (HOSPITAL_COMMUNITY): Payer: Self-pay

## 2020-03-03 NOTE — Progress Notes (Signed)
I have attempted to reach patient for follow-up LDCT through the Lung Cancer Screening Program. Unable to reach patient at this time and the patient does not have VM available.

## 2020-03-11 ENCOUNTER — Encounter (HOSPITAL_COMMUNITY): Payer: Self-pay

## 2020-03-12 DIAGNOSIS — J449 Chronic obstructive pulmonary disease, unspecified: Secondary | ICD-10-CM | POA: Diagnosis not present

## 2020-03-25 DIAGNOSIS — M47812 Spondylosis without myelopathy or radiculopathy, cervical region: Secondary | ICD-10-CM | POA: Diagnosis not present

## 2020-03-25 DIAGNOSIS — Z79891 Long term (current) use of opiate analgesic: Secondary | ICD-10-CM | POA: Diagnosis not present

## 2020-03-25 DIAGNOSIS — Z79899 Other long term (current) drug therapy: Secondary | ICD-10-CM | POA: Diagnosis not present

## 2020-04-09 DIAGNOSIS — E559 Vitamin D deficiency, unspecified: Secondary | ICD-10-CM | POA: Diagnosis not present

## 2020-04-09 DIAGNOSIS — F17210 Nicotine dependence, cigarettes, uncomplicated: Secondary | ICD-10-CM | POA: Diagnosis not present

## 2020-04-09 DIAGNOSIS — E7800 Pure hypercholesterolemia, unspecified: Secondary | ICD-10-CM | POA: Diagnosis not present

## 2020-04-09 DIAGNOSIS — A599 Trichomoniasis, unspecified: Secondary | ICD-10-CM | POA: Diagnosis not present

## 2020-04-09 DIAGNOSIS — R7303 Prediabetes: Secondary | ICD-10-CM | POA: Diagnosis not present

## 2020-04-11 DIAGNOSIS — J449 Chronic obstructive pulmonary disease, unspecified: Secondary | ICD-10-CM | POA: Diagnosis not present

## 2020-04-12 ENCOUNTER — Encounter (HOSPITAL_COMMUNITY): Payer: Self-pay

## 2020-04-12 NOTE — Progress Notes (Signed)
Attempted to reach patient regarding Lung Cancer Screening Program. Unable to reach patient at this time. VM unavailable.

## 2020-04-20 ENCOUNTER — Encounter (HOSPITAL_COMMUNITY): Payer: Self-pay

## 2020-04-22 DIAGNOSIS — M67911 Unspecified disorder of synovium and tendon, right shoulder: Secondary | ICD-10-CM | POA: Diagnosis not present

## 2020-04-22 DIAGNOSIS — Z79899 Other long term (current) drug therapy: Secondary | ICD-10-CM | POA: Diagnosis not present

## 2020-04-22 DIAGNOSIS — R209 Unspecified disturbances of skin sensation: Secondary | ICD-10-CM | POA: Diagnosis not present

## 2020-04-22 DIAGNOSIS — M47812 Spondylosis without myelopathy or radiculopathy, cervical region: Secondary | ICD-10-CM | POA: Diagnosis not present

## 2020-05-10 ENCOUNTER — Encounter (HOSPITAL_COMMUNITY): Payer: Self-pay

## 2020-05-12 DIAGNOSIS — J449 Chronic obstructive pulmonary disease, unspecified: Secondary | ICD-10-CM | POA: Diagnosis not present

## 2020-05-20 DIAGNOSIS — Z79891 Long term (current) use of opiate analgesic: Secondary | ICD-10-CM | POA: Diagnosis not present

## 2020-05-20 DIAGNOSIS — Z79899 Other long term (current) drug therapy: Secondary | ICD-10-CM | POA: Diagnosis not present

## 2020-05-20 DIAGNOSIS — M47812 Spondylosis without myelopathy or radiculopathy, cervical region: Secondary | ICD-10-CM | POA: Diagnosis not present

## 2020-05-21 ENCOUNTER — Other Ambulatory Visit (HOSPITAL_COMMUNITY): Payer: Self-pay

## 2020-05-21 DIAGNOSIS — Z87891 Personal history of nicotine dependence: Secondary | ICD-10-CM

## 2020-05-21 DIAGNOSIS — Z122 Encounter for screening for malignant neoplasm of respiratory organs: Secondary | ICD-10-CM

## 2020-05-21 NOTE — Progress Notes (Signed)
Order placed for follow-up LDCT scan. Patient scheduled for 12/17 at 0900.

## 2020-06-11 DIAGNOSIS — J449 Chronic obstructive pulmonary disease, unspecified: Secondary | ICD-10-CM | POA: Diagnosis not present

## 2020-06-11 DIAGNOSIS — F5221 Male erectile disorder: Secondary | ICD-10-CM | POA: Diagnosis not present

## 2020-06-11 DIAGNOSIS — J441 Chronic obstructive pulmonary disease with (acute) exacerbation: Secondary | ICD-10-CM | POA: Diagnosis not present

## 2020-06-16 DIAGNOSIS — M47812 Spondylosis without myelopathy or radiculopathy, cervical region: Secondary | ICD-10-CM | POA: Diagnosis not present

## 2020-06-16 DIAGNOSIS — Z79891 Long term (current) use of opiate analgesic: Secondary | ICD-10-CM | POA: Diagnosis not present

## 2020-06-16 DIAGNOSIS — Z79899 Other long term (current) drug therapy: Secondary | ICD-10-CM | POA: Diagnosis not present

## 2020-06-18 ENCOUNTER — Ambulatory Visit (HOSPITAL_COMMUNITY): Payer: PPO

## 2020-07-12 ENCOUNTER — Encounter (HOSPITAL_COMMUNITY): Payer: Self-pay

## 2020-07-12 DIAGNOSIS — J449 Chronic obstructive pulmonary disease, unspecified: Secondary | ICD-10-CM | POA: Diagnosis not present

## 2020-07-12 NOTE — Progress Notes (Signed)
Attempted to call patient to reschedule LDCT. Unable to reach patient at this time and no VM available.

## 2020-07-15 DIAGNOSIS — Z79891 Long term (current) use of opiate analgesic: Secondary | ICD-10-CM | POA: Diagnosis not present

## 2020-07-15 DIAGNOSIS — M47812 Spondylosis without myelopathy or radiculopathy, cervical region: Secondary | ICD-10-CM | POA: Diagnosis not present

## 2020-07-15 DIAGNOSIS — Z79899 Other long term (current) drug therapy: Secondary | ICD-10-CM | POA: Diagnosis not present

## 2020-07-29 ENCOUNTER — Encounter (HOSPITAL_COMMUNITY): Payer: Self-pay

## 2020-07-29 NOTE — Progress Notes (Signed)
Attempted to call the patient today for follow-up LDCT. Unable to reach patient and no VM box available.

## 2020-08-12 DIAGNOSIS — J449 Chronic obstructive pulmonary disease, unspecified: Secondary | ICD-10-CM | POA: Diagnosis not present

## 2020-08-13 DIAGNOSIS — Z79891 Long term (current) use of opiate analgesic: Secondary | ICD-10-CM | POA: Diagnosis not present

## 2020-08-13 DIAGNOSIS — M47812 Spondylosis without myelopathy or radiculopathy, cervical region: Secondary | ICD-10-CM | POA: Diagnosis not present

## 2020-08-13 DIAGNOSIS — Z79899 Other long term (current) drug therapy: Secondary | ICD-10-CM | POA: Diagnosis not present

## 2020-08-13 DIAGNOSIS — M67911 Unspecified disorder of synovium and tendon, right shoulder: Secondary | ICD-10-CM | POA: Diagnosis not present

## 2020-08-26 ENCOUNTER — Encounter (HOSPITAL_COMMUNITY): Payer: Self-pay

## 2020-08-26 NOTE — Progress Notes (Signed)
LDCT scheduled 09/20/20 at 0800. Patient aware

## 2020-08-30 ENCOUNTER — Ambulatory Visit (HOSPITAL_COMMUNITY): Payer: PPO

## 2020-09-09 DIAGNOSIS — J449 Chronic obstructive pulmonary disease, unspecified: Secondary | ICD-10-CM | POA: Diagnosis not present

## 2020-09-10 DIAGNOSIS — M47812 Spondylosis without myelopathy or radiculopathy, cervical region: Secondary | ICD-10-CM | POA: Diagnosis not present

## 2020-09-10 DIAGNOSIS — Z79899 Other long term (current) drug therapy: Secondary | ICD-10-CM | POA: Diagnosis not present

## 2020-09-10 DIAGNOSIS — Z79891 Long term (current) use of opiate analgesic: Secondary | ICD-10-CM | POA: Diagnosis not present

## 2020-09-20 ENCOUNTER — Ambulatory Visit (HOSPITAL_COMMUNITY): Payer: PPO

## 2020-10-04 ENCOUNTER — Encounter (HOSPITAL_COMMUNITY): Payer: Self-pay

## 2020-10-04 DIAGNOSIS — Z79891 Long term (current) use of opiate analgesic: Secondary | ICD-10-CM | POA: Diagnosis not present

## 2020-10-04 DIAGNOSIS — M47812 Spondylosis without myelopathy or radiculopathy, cervical region: Secondary | ICD-10-CM | POA: Diagnosis not present

## 2020-10-04 DIAGNOSIS — Z79899 Other long term (current) drug therapy: Secondary | ICD-10-CM | POA: Diagnosis not present

## 2020-10-04 NOTE — Progress Notes (Signed)
I have attempted to reach the patient regarding follow-up LDCT. Unable to reach the patient at this time, no VM available.

## 2020-10-10 DIAGNOSIS — J449 Chronic obstructive pulmonary disease, unspecified: Secondary | ICD-10-CM | POA: Diagnosis not present

## 2020-10-27 DIAGNOSIS — F5221 Male erectile disorder: Secondary | ICD-10-CM | POA: Diagnosis not present

## 2020-10-27 DIAGNOSIS — J069 Acute upper respiratory infection, unspecified: Secondary | ICD-10-CM | POA: Diagnosis not present

## 2020-10-27 DIAGNOSIS — Z9189 Other specified personal risk factors, not elsewhere classified: Secondary | ICD-10-CM | POA: Diagnosis not present

## 2020-10-30 ENCOUNTER — Other Ambulatory Visit: Payer: Self-pay

## 2020-10-30 ENCOUNTER — Encounter (HOSPITAL_COMMUNITY): Payer: Self-pay | Admitting: Emergency Medicine

## 2020-10-30 ENCOUNTER — Emergency Department (HOSPITAL_COMMUNITY): Payer: PPO

## 2020-10-30 ENCOUNTER — Inpatient Hospital Stay (HOSPITAL_COMMUNITY)
Admission: EM | Admit: 2020-10-30 | Discharge: 2020-11-02 | DRG: 190 | Disposition: A | Discharge status: Home / Self Care | Payer: PPO | Attending: Family Medicine | Admitting: Family Medicine

## 2020-10-30 ENCOUNTER — Inpatient Hospital Stay (HOSPITAL_COMMUNITY): Payer: PPO

## 2020-10-30 DIAGNOSIS — Z885 Allergy status to narcotic agent status: Secondary | ICD-10-CM

## 2020-10-30 DIAGNOSIS — F17210 Nicotine dependence, cigarettes, uncomplicated: Secondary | ICD-10-CM | POA: Diagnosis present

## 2020-10-30 DIAGNOSIS — Z8701 Personal history of pneumonia (recurrent): Secondary | ICD-10-CM

## 2020-10-30 DIAGNOSIS — R0602 Shortness of breath: Secondary | ICD-10-CM | POA: Diagnosis not present

## 2020-10-30 DIAGNOSIS — M549 Dorsalgia, unspecified: Secondary | ICD-10-CM | POA: Diagnosis not present

## 2020-10-30 DIAGNOSIS — J9600 Acute respiratory failure, unspecified whether with hypoxia or hypercapnia: Secondary | ICD-10-CM | POA: Diagnosis not present

## 2020-10-30 DIAGNOSIS — Z79899 Other long term (current) drug therapy: Secondary | ICD-10-CM | POA: Diagnosis not present

## 2020-10-30 DIAGNOSIS — J441 Chronic obstructive pulmonary disease with (acute) exacerbation: Secondary | ICD-10-CM | POA: Diagnosis not present

## 2020-10-30 DIAGNOSIS — K219 Gastro-esophageal reflux disease without esophagitis: Secondary | ICD-10-CM | POA: Diagnosis present

## 2020-10-30 DIAGNOSIS — Z7951 Long term (current) use of inhaled steroids: Secondary | ICD-10-CM

## 2020-10-30 DIAGNOSIS — I10 Essential (primary) hypertension: Secondary | ICD-10-CM | POA: Diagnosis not present

## 2020-10-30 DIAGNOSIS — Z8249 Family history of ischemic heart disease and other diseases of the circulatory system: Secondary | ICD-10-CM

## 2020-10-30 DIAGNOSIS — E871 Hypo-osmolality and hyponatremia: Secondary | ICD-10-CM | POA: Diagnosis not present

## 2020-10-30 DIAGNOSIS — Z7982 Long term (current) use of aspirin: Secondary | ICD-10-CM | POA: Diagnosis not present

## 2020-10-30 DIAGNOSIS — Z20822 Contact with and (suspected) exposure to covid-19: Secondary | ICD-10-CM | POA: Diagnosis not present

## 2020-10-30 DIAGNOSIS — I2510 Atherosclerotic heart disease of native coronary artery without angina pectoris: Secondary | ICD-10-CM | POA: Diagnosis not present

## 2020-10-30 DIAGNOSIS — J9601 Acute respiratory failure with hypoxia: Secondary | ICD-10-CM | POA: Diagnosis not present

## 2020-10-30 DIAGNOSIS — J440 Chronic obstructive pulmonary disease with acute lower respiratory infection: Secondary | ICD-10-CM | POA: Diagnosis not present

## 2020-10-30 DIAGNOSIS — R600 Localized edema: Secondary | ICD-10-CM

## 2020-10-30 DIAGNOSIS — J189 Pneumonia, unspecified organism: Secondary | ICD-10-CM | POA: Diagnosis not present

## 2020-10-30 DIAGNOSIS — R059 Cough, unspecified: Secondary | ICD-10-CM

## 2020-10-30 DIAGNOSIS — Z800 Family history of malignant neoplasm of digestive organs: Secondary | ICD-10-CM

## 2020-10-30 DIAGNOSIS — F17200 Nicotine dependence, unspecified, uncomplicated: Secondary | ICD-10-CM | POA: Diagnosis present

## 2020-10-30 DIAGNOSIS — G8929 Other chronic pain: Secondary | ICD-10-CM | POA: Diagnosis present

## 2020-10-30 DIAGNOSIS — R0989 Other specified symptoms and signs involving the circulatory and respiratory systems: Secondary | ICD-10-CM

## 2020-10-30 DIAGNOSIS — R509 Fever, unspecified: Secondary | ICD-10-CM | POA: Diagnosis not present

## 2020-10-30 LAB — BASIC METABOLIC PANEL
Anion gap: 9 (ref 5–15)
BUN: 11 mg/dL (ref 6–20)
CO2: 23 mmol/L (ref 22–32)
Calcium: 8.9 mg/dL (ref 8.9–10.3)
Chloride: 101 mmol/L (ref 98–111)
Creatinine, Ser: 0.72 mg/dL (ref 0.61–1.24)
GFR, Estimated: >60 mL/min (ref >60)
Glucose, Bld: 131 mg/dL — ABNORMAL HIGH (ref 70–99)
Potassium: 4.1 mmol/L (ref 3.5–5.1)
Sodium: 133 mmol/L — ABNORMAL LOW (ref 135–145)

## 2020-10-30 LAB — CBC WITH DIFFERENTIAL/PLATELET
Abs Immature Granulocytes: 0.06 10*3/uL (ref 0.00–0.07)
Basophils Absolute: 0 10*3/uL (ref 0.0–0.1)
Basophils Relative: 0 %
Eosinophils Absolute: 0.1 10*3/uL (ref 0.0–0.5)
Eosinophils Relative: 0 %
HCT: 39.9 % (ref 39.0–52.0)
Hemoglobin: 13.1 g/dL (ref 13.0–17.0)
Immature Granulocytes: 1 %
Lymphocytes Relative: 10 %
Lymphs Abs: 1.2 10*3/uL (ref 0.7–4.0)
MCH: 30.3 pg (ref 26.0–34.0)
MCHC: 32.8 g/dL (ref 30.0–36.0)
MCV: 92.1 fL (ref 80.0–100.0)
Monocytes Absolute: 0.9 10*3/uL (ref 0.1–1.0)
Monocytes Relative: 7 %
Neutro Abs: 10.3 10*3/uL — ABNORMAL HIGH (ref 1.7–7.7)
Neutrophils Relative %: 82 %
Platelets: 188 10*3/uL (ref 150–400)
RBC: 4.33 MIL/uL (ref 4.22–5.81)
RDW: 12.7 % (ref 11.5–15.5)
WBC: 12.6 10*3/uL — ABNORMAL HIGH (ref 4.0–10.5)
nRBC: 0 % (ref 0.0–0.2)

## 2020-10-30 LAB — RESP PANEL BY RT-PCR (FLU A&B, COVID) ARPGX2
Influenza A by PCR: NEGATIVE
Influenza B by PCR: NEGATIVE
SARS Coronavirus 2 by RT PCR: NEGATIVE

## 2020-10-30 LAB — HIV ANTIBODY (ROUTINE TESTING W REFLEX): HIV Screen 4th Generation wRfx: NONREACTIVE

## 2020-10-30 MED ORDER — ONDANSETRON HCL 4 MG PO TABS: 4.0000 mg | ORAL_TABLET | Freq: Four times a day (QID) | ORAL | Status: DC | PRN

## 2020-10-30 MED ORDER — ACETAMINOPHEN 325 MG PO TABS: 650.0000 mg | ORAL_TABLET | Freq: Four times a day (QID) | ORAL | Status: DC | PRN

## 2020-10-30 MED ORDER — ALBUTEROL (5 MG/ML) CONTINUOUS INHALATION SOLN
10.0000 mg/h | INHALATION_SOLUTION | Freq: Once | RESPIRATORY_TRACT | Status: AC
  Administered 2020-10-30: 10 mg/h via RESPIRATORY_TRACT
  Filled 2020-10-30: qty 20

## 2020-10-30 MED ORDER — MORPHINE SULFATE (PF) 2 MG/ML IV SOLN
1.0000 mg | INTRAVENOUS | Status: DC | PRN
Start: 2020-10-30 — End: 2020-11-02
  Administered 2020-10-31 – 2020-11-01 (×4): 1 mg via INTRAVENOUS
  Filled 2020-10-30 (×4): qty 1

## 2020-10-30 MED ORDER — ONDANSETRON HCL 4 MG/2ML IJ SOLN: 4.0000 mg | Freq: Four times a day (QID) | INTRAMUSCULAR | Status: DC | PRN

## 2020-10-30 MED ORDER — PREDNISONE 20 MG PO TABS
40.0000 mg | ORAL_TABLET | Freq: Every day | ORAL | Status: DC
  Administered 2020-10-31: 40 mg via ORAL
  Filled 2020-10-30: qty 2

## 2020-10-30 MED ORDER — NICOTINE 21 MG/24HR TD PT24
21.0000 mg | MEDICATED_PATCH | Freq: Every day | TRANSDERMAL | Status: DC
  Administered 2020-10-30 – 2020-11-02 (×4): 21 mg via TRANSDERMAL
  Filled 2020-10-30 (×4): qty 1

## 2020-10-30 MED ORDER — GUAIFENESIN ER 600 MG PO TB12
1200.0000 mg | ORAL_TABLET | Freq: Two times a day (BID) | ORAL | Status: DC
  Administered 2020-10-30 – 2020-11-02 (×7): 1200 mg via ORAL
  Filled 2020-10-30 (×7): qty 2

## 2020-10-30 MED ORDER — ALBUTEROL SULFATE (2.5 MG/3ML) 0.083% IN NEBU
INHALATION_SOLUTION | RESPIRATORY_TRACT | Status: AC
  Filled 2020-10-30: qty 3

## 2020-10-30 MED ORDER — LEVOFLOXACIN IN D5W 750 MG/150ML IV SOLN
750.0000 mg | INTRAVENOUS | Status: DC
  Administered 2020-10-30 – 2020-11-02 (×4): 750 mg via INTRAVENOUS
  Filled 2020-10-30 (×4): qty 150

## 2020-10-30 MED ORDER — METHYLPREDNISOLONE SODIUM SUCC 125 MG IJ SOLR
80.0000 mg | Freq: Four times a day (QID) | INTRAMUSCULAR | Status: AC
  Administered 2020-10-30 – 2020-10-31 (×4): 80 mg via INTRAVENOUS
  Filled 2020-10-30 (×4): qty 2

## 2020-10-30 MED ORDER — HEPARIN SODIUM (PORCINE) 5000 UNIT/ML IJ SOLN
5000.0000 [IU] | Freq: Three times a day (TID) | INTRAMUSCULAR | Status: DC
  Administered 2020-10-30 – 2020-10-31 (×4): 5000 [IU] via SUBCUTANEOUS
  Filled 2020-10-30 (×4): qty 1

## 2020-10-30 MED ORDER — DIAZEPAM 5 MG PO TABS: 5.0000 mg | ORAL_TABLET | Freq: Two times a day (BID) | ORAL | Status: DC | PRN

## 2020-10-30 MED ORDER — SODIUM CHLORIDE 0.9 % IV SOLN: INTRAVENOUS | Status: DC

## 2020-10-30 MED ORDER — IPRATROPIUM BROMIDE 0.02 % IN SOLN
RESPIRATORY_TRACT | Status: AC
  Filled 2020-10-30: qty 2.5

## 2020-10-30 MED ORDER — MORPHINE SULFATE (PF) 2 MG/ML IV SOLN: 2.0000 mg | INTRAVENOUS | Status: DC | PRN

## 2020-10-30 MED ORDER — IPRATROPIUM-ALBUTEROL 0.5-2.5 (3) MG/3ML IN SOLN
3.0000 mL | Freq: Four times a day (QID) | RESPIRATORY_TRACT | Status: DC
  Administered 2020-10-30 – 2020-10-31 (×4): 3 mL via RESPIRATORY_TRACT
  Filled 2020-10-30 (×4): qty 3

## 2020-10-30 MED ORDER — ASPIRIN EC 81 MG PO TBEC
81.0000 mg | DELAYED_RELEASE_TABLET | Freq: Every day | ORAL | Status: DC
  Administered 2020-10-30 – 2020-11-02 (×4): 81 mg via ORAL
  Filled 2020-10-30 (×4): qty 1

## 2020-10-30 MED ORDER — OXYCODONE HCL 5 MG PO TABS
15.0000 mg | ORAL_TABLET | ORAL | Status: DC
Start: 2020-10-30 — End: 2020-11-02
  Administered 2020-10-30 – 2020-11-02 (×20): 15 mg via ORAL
  Filled 2020-10-30 (×20): qty 3

## 2020-10-30 MED ORDER — ALBUTEROL SULFATE (2.5 MG/3ML) 0.083% IN NEBU
2.5000 mg | INHALATION_SOLUTION | RESPIRATORY_TRACT | Status: DC | PRN
  Administered 2020-10-30: 2.5 mg via RESPIRATORY_TRACT

## 2020-10-30 MED ORDER — METHYLPREDNISOLONE SODIUM SUCC 125 MG IJ SOLR
125.0000 mg | Freq: Once | INTRAMUSCULAR | Status: AC
  Administered 2020-10-30: 125 mg via INTRAVENOUS
  Filled 2020-10-30: qty 2

## 2020-10-30 MED ORDER — UMECLIDINIUM-VILANTEROL 62.5-25 MCG/INH IN AEPB
1.0000 | INHALATION_SPRAY | Freq: Every day | RESPIRATORY_TRACT | Status: DC
  Administered 2020-10-30 – 2020-10-31 (×2): 1 via RESPIRATORY_TRACT
  Filled 2020-10-30: qty 14

## 2020-10-30 MED ORDER — ROSUVASTATIN CALCIUM 20 MG PO TABS
20.0000 mg | ORAL_TABLET | Freq: Every day | ORAL | Status: DC
  Administered 2020-10-30 – 2020-11-02 (×4): 20 mg via ORAL
  Filled 2020-10-30 (×4): qty 1

## 2020-10-30 MED ORDER — ACETAMINOPHEN 650 MG RE SUPP: 650.0000 mg | Freq: Four times a day (QID) | RECTAL | Status: DC | PRN

## 2020-10-30 MED ORDER — SENNOSIDES-DOCUSATE SODIUM 8.6-50 MG PO TABS
2.0000 | ORAL_TABLET | Freq: Every day | ORAL | Status: DC
  Administered 2020-10-30 – 2020-11-01 (×3): 2 via ORAL
  Filled 2020-10-30 (×3): qty 2

## 2020-10-30 MED ORDER — OXYCODONE HCL 5 MG PO TABS
15.0000 mg | ORAL_TABLET | Freq: Once | ORAL | Status: AC
  Administered 2020-10-30: 15 mg via ORAL
  Filled 2020-10-30: qty 3

## 2020-10-30 NOTE — H&P (Signed)
TRH H&P    Patient Demographics:    [name] Carter, is a 60 y.o. male  MRN: [id number]  DOB - 02-16-1961  Admit Date - 10/30/2020  Referring MD/NP/PA: Betsey Holiday  Outpatient Primary MD for the patient is Sandi Mariscal, MD  Patient coming from: Home  Chief complaint- dyspnea   HPI:    [name] Carter  is a 60 y.o. male, with history of chronic back pain, hypertension, COPD, recurrent pneumonia, and more presents the ED with a chief complaint of " I have been sick for a week."  Patient reports that he started with dyspnea and fevers a week ago.  He did not get any better.  He reports that he had fevers as high as 103.  The fever seemed to break 3 days ago.  Now he has a cough with thick yellow sputum.  He is coughing so frequently that is scattered stomach pain and chest pain from the cough.  His stomach pain and chest pain are both worse when he coughs, or when he takes a deep breath.  He reports his dyspnea and cough are worse with exertion.  He does have a wheeze at home.  He has been taking his inhalers and they offered temporary relief but then the symptoms return.  He has as needed oxygen at home and has not tried that.  Patient reports associated generalized weakness and fatigue, shortness of breath.  He did call his PCP who prescribed him Levaquin and prednisone 2 days ago.  Patient reports the Levaquin usually clears up his pneumonia.  Patient has no other complaints at this time.  Patient is current smoker and request nicotine patch.  He does not drink alcohol and does not use illicit drugs.  He is vaccinated for COVID without booster.  He is full code.  In the ED Temp 99.1, heart rate 53-82, respiratory rate 13-24, blood pressure 128/71 Chemistry panel reveals a very slight hyponatremia at 133 Hematology reveals a leukocytosis at 12.6 Respiratory panel negative  Chest x-ray shows large lung volumes with mild coarse  asymmetric increased pulmonary interstitial opacity since 2020 possibly infection EKG shows sinus rhythm 84, QTc 453, baseline wander, no ischemic changes    Review of systems:    In addition to the HPI above,  Admits to fever and chills, No Headache, No changes with Vision or hearing, No problems swallowing food or Liquids, Admits to chest pain, cough, shortness of breath Admits to abdominal pain and nausea but no vomiting, bowel movements are regular, No Blood in stool or Urine, No dysuria, No new skin rashes or bruises, No new joints pains-aches,  No new weakness, tingling, numbness in any extremity, No recent weight gain or loss, No polyuria, polydypsia or polyphagia, No significant Mental Stressors.  All other systems reviewed and are negative.    Past History of the following :    Past Medical History:  Diagnosis Date  . Chronic back pain   . COPD (chronic obstructive pulmonary disease) ([place])   . DDD (degenerative disc disease), cervical   .  DDD (degenerative disc disease), lumbar   . HTN (hypertension)   . Neck pain   . Pneumonia       Past Surgical History:  Procedure Laterality Date  . BACK SURGERY     low back x4  . BILATERAL CARPAL TUNNEL RELEASE    . bilteral knee surgery    . COLONOSCOPY  07/2012   [place] MD: MAC. Random colon biopsies benign. Rectal polyp tubular adenoma.  . ESOPHAGOGASTRODUODENOSCOPY  07/2012   [place] MD: MAC. Chronic gastritis and intestinal metaplasia, ?metaplastic atrophic gastritis, negative H.pylori, no celiac, duidnal mucosa with mild Brunner gland hyperplasia, nonspecific.   [facility] LEFT HEART CATH AND CORONARY ANGIOGRAPHY N/A 11/07/2017   Procedure: LEFT HEART CATH AND CORONARY ANGIOGRAPHY;  Surgeon: Belva Crome, MD;  Location: [facility];  Service: Cardiovascular;  Laterality: N/A;  . ULNAR NERVE TRANSPOSITION     ? by description, not clear      Social History:      Social History   Tobacco Use  . Smoking  status: Heavy Tobacco Smoker    Packs/day: 1.00    Years: 41.00    Pack years: 41.00    Types: Cigarettes    Start date: 75  . Smokeless tobacco: Never Used  . Tobacco comment: one pack daily  Substance Use Topics  . Alcohol use: No    Alcohol/week: 0.0 standard drinks       Family History :     Family History  Problem Relation Age of Onset  . Heart attack Father   . Colon cancer Father        diagnosed in his 20s  . Heart attack Mother       Home Medications:   Prior to Admission medications   Medication Sig Start Date End Date Taking? Authorizing Provider  albuterol (PROVENTIL HFA;VENTOLIN HFA) 108 (90 BASE) MCG/ACT inhaler Inhale 2 puffs into the lungs every 4 (four) hours as needed for wheezing or shortness of breath. 08/21/14   Samuella Cota, MD  albuterol (PROVENTIL) (2.5 MG/3ML) 0.083% nebulizer solution Take 3 mLs (2.5 mg total) by nebulization every 6 (six) hours as needed for wheezing or shortness of breath. 09/18/17   Sinda Du, MD  aspirin EC 81 MG tablet Take 1 tablet (81 mg total) by mouth daily. 09/21/17   Arnoldo Lenis, MD  diazepam (VALIUM) 5 MG tablet Take 1 tablet by mouth daily. 03/13/19   [provider]  diclofenac sodium (VOLTAREN) 1 % GEL  02/11/19   [provider]  IBU 800 MG tablet Take 1 tablet by mouth daily. 01/02/19   [provider]  nitroGLYCERIN (NITROSTAT) 0.4 MG SL tablet Place 1 tablet (0.4 mg total) under the tongue every 5 (five) minutes as needed for chest pain. 07/14/19 10/12/19  Arnoldo Lenis, MD  oxyCODONE (ROXICODONE) 15 MG immediate release tablet Take 1 tablet by mouth every 4 (four) hours. 03/13/19   [provider]  rosuvastatin (CRESTOR) 20 MG tablet Take 1 tablet (20 mg total) by mouth daily. 05/16/19 08/14/19  Strader, Fransisco Hertz, PA-C  umeclidinium-vilanterol (ANORO ELLIPTA) 62.5-25 MCG/INH AEPB Inhale 1 puff into the lungs daily.    [provider]     Allergies:      Allergies  Allergen Reactions  . Vicodin [Hydrocodone-Acetaminophen] Nausea And Vomiting and Other (See Comments)    Made patient feel funny, stomach pain     Physical Exam:   Vitals  Blood pressure 110/68, pulse 66, temperature  98.8 F (37.1 C), temperature source Oral, resp. rate 15, height _0  (1.753 m), weight 73 kg, SpO2 93 %.  1.  General: Patient lying supine in bed head of bed elevated continuous neb currently being administered  2. Psychiatric: Patient mood behavior normal for situation, alert and oriented x3, Pleasant and cooperative with exam  3. Neurologic: Face is symmetric, speech and language are normal, moves all 4 extremities voluntarily no acute deficits on limited exam  4. HEENMT:  Head is Atraumatic normocephalic, pupils reactive, neck is supple, trachea is midline, mucous membranes mildly dry  5. Respiratory : Lungs with diffuse wheezing, rhonchi at the bases, no crackles, increased work of breathing with use of accessory muscles  6. Cardiovascular : Heart rate is normal, rhythm is regular, no murmurs rubs or gallops, tace pitting edema at the sock line, no cyanosis  7. Gastrointestinal:  Abdomen is soft, non distended, non tender to palpation, no masses palpated, bowel sounds normal  8. Skin:  Skin is warm dry and intact without acute lesion on limited exam  9.Musculoskeletal:  No acute deformity, peripheral pulses palpated, right leg slightly more edematous than left, no calf tenderness    Data Review:    CBC Recent Labs  Lab 10/30/20 0145  WBC 12.6*  HGB 13.1  HCT 39.9  PLT 188  MCV 92.1  MCH 30.3  MCHC 32.8  RDW 12.7  LYMPHSABS 1.2  MONOABS 0.9  EOSABS 0.1  BASOSABS 0.0   ------------------------------------------------------------------------------------------------------------------  Results for orders placed or performed during the hospital encounter of 10/30/20 (from the past 48 hour(s))  Resp Panel by RT-PCR (Flu  A&B, Covid) Nasopharyngeal Swab     Status: None   Collection Time: 10/30/20  1:38 AM   Specimen: Nasopharyngeal Swab; Nasopharyngeal(NP) swabs in vial transport medium  Result Value Ref Range   SARS Coronavirus 2 by RT PCR NEGATIVE NEGATIVE    Comment: (NOTE) SARS-CoV-2 target nucleic acids are NOT DETECTED.  The SARS-CoV-2 RNA is generally detectable in upper respiratory specimens during the acute phase of infection. The lowest concentration of SARS-CoV-2 viral copies this assay can detect is 138 copies/mL. A negative result does not preclude SARS-Cov-2 infection and should not be used as the sole basis for treatment or other patient management decisions. A negative result may occur with  improper specimen collection/handling, submission of specimen other than nasopharyngeal swab, presence of viral mutation(s) within the areas targeted by this assay, and inadequate number of viral copies(<138 copies/mL). A negative result must be combined with clinical observations, patient history, and epidemiological information. The expected result is Negative.  Fact Sheet for Patients:  EntrepreneurPulse.com.au  Fact Sheet for Healthcare Providers:  IncredibleEmployment.be  This test is no t yet approved or cleared by the [place] FDA and  has been authorized for detection and/or diagnosis of SARS-CoV-2 by FDA under an Emergency Use Authorization (EUA). This EUA will remain  in effect (meaning this test can be used) for the duration of the COVID-19 declaration under Section 564(b)(1) of the Act, 21 U.S.C.section 360bbb-3(b)(1), unless the authorization is terminated  or revoked sooner.       Influenza A by PCR NEGATIVE NEGATIVE   Influenza B by PCR NEGATIVE NEGATIVE    Comment: (NOTE) The Xpert Xpress SARS-CoV-2/FLU/RSV plus assay is intended as an aid in the diagnosis of influenza from Nasopharyngeal swab specimens and should not be used as a  sole basis for treatment. Nasal washings and aspirates are unacceptable for Xpert Xpress SARS-CoV-2/FLU/RSV testing.  Fact Sheet for Patients: EntrepreneurPulse.com.au  Fact Sheet for Healthcare Providers: IncredibleEmployment.be  This test is not yet approved or cleared by the [place] FDA and has been authorized for detection and/or diagnosis of SARS-CoV-2 by FDA under an Emergency Use Authorization (EUA). This EUA will remain in effect (meaning this test can be used) for the duration of the COVID-19 declaration under Section 564(b)(1) of the Act, 21 U.S.C. section 360bbb-3(b)(1), unless the authorization is terminated or revoked.  Performed at [facility], [address] [place], [place] [address]   CBC with Differential/Platelet     Status: Abnormal   Collection Time: 10/30/20  1:45 AM  Result Value Ref Range   WBC 12.6 (H) 4.0 - 10.5 K/uL   RBC 4.33 4.22 - 5.81 MIL/uL   Hemoglobin 13.1 13.0 - 17.0 g/dL   HCT 39.9 39.0 - 52.0 %   MCV 92.1 80.0 - 100.0 fL   MCH 30.3 26.0 - 34.0 pg   MCHC 32.8 30.0 - 36.0 g/dL   RDW 12.7 11.5 - 15.5 %   Platelets 188 150 - 400 K/uL   nRBC 0.0 0.0 - 0.2 %   Neutrophils Relative % 82 %   Neutro Abs 10.3 (H) 1.7 - 7.7 K/uL   Lymphocytes Relative 10 %   Lymphs Abs 1.2 0.7 - 4.0 K/uL   Monocytes Relative 7 %   Monocytes Absolute 0.9 0.1 - 1.0 K/uL   Eosinophils Relative 0 %   Eosinophils Absolute 0.1 0.0 - 0.5 K/uL   Basophils Relative 0 %   Basophils Absolute 0.0 0.0 - 0.1 K/uL   Immature Granulocytes 1 %   Abs Immature Granulocytes 0.06 0.00 - 0.07 K/uL    Comment: Performed at [facility], [address] [place], [place] [address]  Basic metabolic panel     Status: Abnormal   Collection Time: 10/30/20  1:45 AM  Result Value Ref Range   Sodium 133 (L) 135 - 145 mmol/L   Potassium 4.1 3.5 - 5.1 mmol/L   Chloride 101 98 - 111 mmol/L   CO2 23 22 - 32 mmol/L   Glucose, Bld 131 (H) 70 - 99 mg/dL     Comment: Glucose reference range applies only to samples taken after fasting for at least 8 hours.   BUN 11 6 - 20 mg/dL   Creatinine, Ser 0.72 0.61 - 1.24 mg/dL   Calcium 8.9 8.9 - 10.3 mg/dL   GFR, Estimated >60 >60 mL/min    Comment: (NOTE) Calculated using the CKD-EPI Creatinine Equation (2021)    Anion gap 9 5 - 15    Comment: Performed at [facility], [address], [place], [place] [address]    Chemistries  Recent Labs  Lab 10/30/20 0145  NA 133*  K 4.1  CL 101  CO2 23  GLUCOSE 131*  BUN 11  CREATININE 0.72  CALCIUM 8.9   ------------------------------------------------------------------------------------------------------------------  ------------------------------------------------------------------------------------------------------------------ GFR: Estimated Creatinine Clearance: 99.4 mL/min (by C-G formula based on SCr of 0.72 mg/dL). Liver Function Tests: No results for input(s): AST, ALT, ALKPHOS, BILITOT, PROT, ALBUMIN in the last 168 hours. No results for input(s): LIPASE, AMYLASE in the last 168 hours. No results for input(s): AMMONIA in the last 168 hours. Coagulation Profile: No results for input(s): INR, PROTIME in the last 168 hours. Cardiac Enzymes: No results for input(s): CKTOTAL, CKMB, CKMBINDEX, TROPONINI in the last 168 hours. BNP (last 3 results) No results for input(s): PROBNP in the last 8760 hours. HbA1C: No results for input(s):  HGBA1C in the last 72 hours. CBG: No results for input(s): GLUCAP in the last 168 hours. Lipid Profile: No results for input(s): CHOL, HDL, LDLCALC, TRIG, CHOLHDL, LDLDIRECT in the last 72 hours. Thyroid Function Tests: No results for input(s): TSH, T4TOTAL, FREET4, T3FREE, THYROIDAB in the last 72 hours. Anemia Panel: No results for input(s): VITAMINB12, FOLATE, FERRITIN, TIBC, IRON, RETICCTPCT in the last 72  hours.  --------------------------------------------------------------------------------------------------------------- Urine analysis:    Component Value Date/Time   COLORURINE YELLOW 04/25/2015 1451   APPEARANCEUR CLEAR 04/25/2015 1451   LABSPEC >1.030 (H) 04/25/2015 1451   PHURINE 6.0 04/25/2015 1451   GLUCOSEU NEGATIVE 04/25/2015 1451   HGBUR MODERATE (A) 04/25/2015 1451   BILIRUBINUR NEGATIVE 04/25/2015 1451   KETONESUR NEGATIVE 04/25/2015 1451   PROTEINUR TRACE (A) 04/25/2015 1451   UROBILINOGEN 0.2 04/25/2015 1451   NITRITE NEGATIVE 04/25/2015 1451   LEUKOCYTESUR NEGATIVE 04/25/2015 1451      Imaging Results:    DG Chest Port 1 View  Result Date: 10/30/2020 CLINICAL DATA:  60 year old male with cough fever and shortness of breath for 4 days. Smoker. EXAM: PORTABLE CHEST 1 VIEW COMPARISON:  Portable chest 06/11/2019 and earlier. FINDINGS: Portable AP upright view at 0146 hours. Larger lung volumes from prior exams. Mediastinal contours are stable and within normal limits. No pneumothorax, pleural effusion or confluent pulmonary opacity. Mild, coarse new bilateral pulmonary interstitial opacity, greater on the left. Visualized tracheal air column is within normal limits. No acute osseous abnormality identified. Negative visible bowel gas pattern. IMPRESSION: Larger lung volumes with mild, coarse and asymmetric increased pulmonary interstitial opacity since 2020. Top differential considerations are viral/atypical respiratory infection, progression of chronic interstitial lung disease. Electronically Signed   By: Genevie Ann M.D.   On: 10/30/2020 04:25      Assessment & Plan:    Principal Problem:   COPD exacerbation ([place]) Active Problems:   Acute respiratory failure ([place])   Tobacco dependence   CAP (community acquired pneumonia)   1. COPD exacerbation and CAP 1. Continue steroids, scheduled duoneb, PRN albuterol 2. Pneumonia on Cxr 3. Patient prefers levaquin as this  medication has worked before - this is reasonable - levaquin continued 4. VBG pending 5. Continue to monitor 2. Acute respiratory failure with hypoxia  1. Continue 2 L [place] 2. 2/2 COPD E - see above 3. VBG pending to assess for hypercapnia 3. Tobacco dependence  1. Advised on the importance of cessation 2. Nicotine patch ordered 4. Hyponatremia 1. Hypovolemic hyponatremia 2. 2/2 poor PO intake 2/2 increased work of breathing/dyspnea 3. Gentle hydration 4. Trend in the am   DVT Prophylaxis-   Heparin - SCDs   AM Labs Ordered, also please review Full Orders  Family Communication: No family at bedside Code Status: Full  Admission status: Inpatient :The appropriate admission status for this patient is INPATIENT. Inpatient status is judged to be reasonable and necessary in order to provide the required intensity of service to ensure the patient's safety. The patient's presenting symptoms, physical exam findings, and initial radiographic and laboratory data in the context of their chronic comorbidities is felt to place them at high risk for further clinical deterioration. Furthermore, it is not anticipated that the patient will be medically stable for discharge from the hospital within 2 midnights of admission. The following factors support the admission status of inpatient.     The patient's presenting symptoms include dyspnea and cough The worrisome physical exam findings include hypoxia, wheezing The initial radiographic and laboratory data are  worrisome because of pneumonia on chest x-ray, hyponatremia The chronic co-morbidities include COPD, hypertension, chronic low back pain       * I certify that at the point of admission it is my clinical judgment that the patient will require inpatient hospital care spanning beyond 2 midnights from the point of admission due to high intensity of service, high risk for further deterioration and high frequency of surveillance required.*  Time  spent in minutes : [place]

## 2020-10-30 NOTE — ED Notes (Signed)
Patient wanting head of bed let down. States that his neck is bothering him at this time. Patient very weak and short of breath.

## 2020-10-30 NOTE — Progress Notes (Signed)
ASSUMPTION OF CARE NOTE   10/30/2020 1:18 PM  [name] Carter was seen and examined.  The H&P by the admitting provider, orders, imaging was reviewed.  Please see new orders.  Will continue to follow.   Vitals:   10/30/20 0740 10/30/20 0855  BP:    Pulse:    Resp:    Temp:    SpO2: 95% 95%    Results for orders placed or performed during the hospital encounter of 10/30/20  Resp Panel by RT-PCR (Flu A&B, Covid) Nasopharyngeal Swab   Specimen: Nasopharyngeal Swab; Nasopharyngeal(NP) swabs in vial transport medium  Result Value Ref Range   SARS Coronavirus 2 by RT PCR NEGATIVE NEGATIVE   Influenza A by PCR NEGATIVE NEGATIVE   Influenza B by PCR NEGATIVE NEGATIVE  CBC with Differential/Platelet  Result Value Ref Range   WBC 12.6 (H) 4.0 - 10.5 K/uL   RBC 4.33 4.22 - 5.81 MIL/uL   Hemoglobin 13.1 13.0 - 17.0 g/dL   HCT 45.6 25.6 - 38.9 %   MCV 92.1 80.0 - 100.0 fL   MCH 30.3 26.0 - 34.0 pg   MCHC 32.8 30.0 - 36.0 g/dL   RDW 37.3 42.8 - 76.8 %   Platelets 188 150 - 400 K/uL   nRBC 0.0 0.0 - 0.2 %   Neutrophils Relative % 82 %   Neutro Abs 10.3 (H) 1.7 - 7.7 K/uL   Lymphocytes Relative 10 %   Lymphs Abs 1.2 0.7 - 4.0 K/uL   Monocytes Relative 7 %   Monocytes Absolute 0.9 0.1 - 1.0 K/uL   Eosinophils Relative 0 %   Eosinophils Absolute 0.1 0.0 - 0.5 K/uL   Basophils Relative 0 %   Basophils Absolute 0.0 0.0 - 0.1 K/uL   Immature Granulocytes 1 %   Abs Immature Granulocytes 0.06 0.00 - 0.07 K/uL  Basic metabolic panel  Result Value Ref Range   Sodium 133 (L) 135 - 145 mmol/L   Potassium 4.1 3.5 - 5.1 mmol/L   Chloride 101 98 - 111 mmol/L   CO2 23 22 - 32 mmol/L   Glucose, Bld 131 (H) 70 - 99 mg/dL   BUN 11 6 - 20 mg/dL   Creatinine, Ser 1.15 0.61 - 1.24 mg/dL   Calcium 8.9 8.9 - 72.6 mg/dL   GFR, Estimated >20 >35 mL/min   Anion gap 9 5 - 15   C. Laural Benes, MD [facility]   10/30/2020  1:36 AM How to contact the [facility] Attending or Consulting provider 7A - 7P or  covering provider during after hours 7P -7A, for this patient?  1. Check the care team in [facility] and look for a) attending/consulting TR[facility] provider listed and b) the [facility] team listed 2. Log into [email or web address] and use [facility] universal password to access. If you do not have the password, please contact the hospital operator. 3. Locate the [facility] provider you are looking for under [facility] and page to a number that you can be directly reached. 4. If you still have difficulty reaching the provider, please page the [facility] (Director on Call) for the Hospitalists listed on amion for assistance.

## 2020-10-30 NOTE — ED Provider Notes (Signed)
[facility] Provider Note   CSN: [id number] Arrival date & time: 10/30/20  0123     History Chief Complaint  Patient presents with  . Respiratory Distress    [name] Carter is a 60 y.o. male.  Patient presents to the emergency department for evaluation of shortness of breath.  Patient has a history of COPD.  He reports that he has been sick for approximately 1 week.  He has had cough productive of thick yellow sputum and progressive shortness of breath over that time.  He reports had a fever for the first 4 days but that has resolved.  He called his doctor yesterday and was started on Levaquin and prednisone but has worsened tonight.  He reports that he has been checking his pulse ox at home and it has been as low as 72 percent.        Past Medical History:  Diagnosis Date  . Chronic back pain   . COPD (chronic obstructive pulmonary disease) ([place])   . DDD (degenerative disc disease), cervical   . DDD (degenerative disc disease), lumbar   . HTN (hypertension)   . Neck pain   . Pneumonia     Patient Active Problem List   Diagnosis Date Noted  . CAD in native artery 11/07/2017  . Hypokalemia 09/18/2017  . Hyperglycemia 09/17/2017  . Abnormal EKG 09/17/2017  . 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 GERD (gastroesophageal reflux disease) 05/20/2015  . Community acquired pneumonia 04/25/2015  . Abdominal pain, epigastric 02/12/2015  . LLQ pain 02/12/2015  . Gastritis and gastroduodenitis 02/12/2015  . Constipation 02/12/2015  . COPD exacerbation ([place]) 08/20/2014  . COPD with acute exacerbation ([place]) 06/27/2014  . Chronic pain 06/27/2014  . Acute respiratory failure with hypoxia ([place]) 06/13/2014  . CAP (community acquired pneumonia) 05/16/2014  . Chest pain   . Acute respiratory failure ([place]) 05/13/2014  . Tobacco dependence 05/13/2014    Past Surgical History:  Procedure Laterality Date  . BACK SURGERY     low back x4  .  BILATERAL CARPAL TUNNEL RELEASE    . bilteral knee surgery    . COLONOSCOPY  07/2012   [place] MD: MAC. Random colon biopsies benign. Rectal polyp tubular adenoma.  . ESOPHAGOGASTRODUODENOSCOPY  07/2012   [place] MD: MAC. Chronic gastritis and intestinal metaplasia, ?metaplastic atrophic gastritis, negative H.pylori, no celiac, duidnal mucosa with mild Brunner gland hyperplasia, nonspecific.   [facility] LEFT HEART CATH AND CORONARY ANGIOGRAPHY N/A 11/07/2017   Procedure: LEFT HEART CATH AND CORONARY ANGIOGRAPHY;  Surgeon: Belva Crome, MD;  Location: [facility];  Service: Cardiovascular;  Laterality: N/A;  . ULNAR NERVE TRANSPOSITION     ? by description, not clear       Family History  Problem Relation Age of Onset  . Heart attack Father   . Colon cancer Father        diagnosed in his 37s  . Heart attack Mother     Social History   Tobacco Use  . Smoking status: Heavy Tobacco Smoker    Packs/day: 1.00    Years: 41.00    Pack years: 41.00    Types: Cigarettes    Start date: 50  . Smokeless tobacco: Never Used  . Tobacco comment: one pack daily  Vaping Use  . Vaping Use: Never used  Substance Use Topics  . Alcohol use: No    Alcohol/week: 0.0 standard drinks  . Drug use: No    Home Medications Prior  to Admission medications   Medication Sig Start Date End Date Taking? Authorizing Provider  albuterol (PROVENTIL HFA;VENTOLIN HFA) 108 (90 BASE) MCG/ACT inhaler Inhale 2 puffs into the lungs every 4 (four) hours as needed for wheezing or shortness of breath. 08/21/14   Samuella Cota, MD  albuterol (PROVENTIL) (2.5 MG/3ML) 0.083% nebulizer solution Take 3 mLs (2.5 mg total) by nebulization every 6 (six) hours as needed for wheezing or shortness of breath. 09/18/17   Sinda Du, MD  aspirin EC 81 MG tablet Take 1 tablet (81 mg total) by mouth daily. 09/21/17   Arnoldo Lenis, MD  diazepam (VALIUM) 5 MG tablet Take 1 tablet by mouth daily. 03/13/19   [provider]  diclofenac sodium (VOLTAREN) 1 % GEL  02/11/19   [provider]  IBU 800 MG tablet Take 1 tablet by mouth daily. 01/02/19   [provider]  nitroGLYCERIN (NITROSTAT) 0.4 MG SL tablet Place 1 tablet (0.4 mg total) under the tongue every 5 (five) minutes as needed for chest pain. 07/14/19 10/12/19  Arnoldo Lenis, MD  oxyCODONE (ROXICODONE) 15 MG immediate release tablet Take 1 tablet by mouth every 4 (four) hours. 03/13/19   [provider]  rosuvastatin (CRESTOR) 20 MG tablet Take 1 tablet (20 mg total) by mouth daily. 05/16/19 08/14/19  Strader, Fransisco Hertz, PA-C  umeclidinium-vilanterol (ANORO ELLIPTA) 62.5-25 MCG/INH AEPB Inhale 1 puff into the lungs daily.    [provider]    Allergies    Vicodin [hydrocodone-acetaminophen]  Review of Systems   Review of Systems  Constitutional: Positive for fever.  Respiratory: Positive for cough, shortness of breath and wheezing.   All other systems reviewed and are negative.   Physical Exam Updated Vital Signs BP 128/71 (BP Location: Left Arm)   Pulse 65   Temp 99.1 F (37.3 C) (Oral)   Resp 17   Ht _0  (1.753 m)   Wt 73 kg   SpO2 96%   BMI 23.77 kg/m   Physical Exam Vitals and nursing note reviewed.  Constitutional:      General: He is not in acute distress.    Appearance: Normal appearance. He is well-developed. He is ill-appearing.  HENT:     Head: Normocephalic and atraumatic.     Right Ear: Hearing normal.     Left Ear: Hearing normal.     Nose: Nose normal.  Eyes:     Conjunctiva/sclera: Conjunctivae normal.     Pupils: Pupils are equal, round, and reactive to light.  Cardiovascular:     Rate and Rhythm: Regular rhythm. Tachycardia present.     Heart sounds: S1 normal and S2 normal. No murmur heard. No friction rub. No gallop.   Pulmonary:     Effort: Tachypnea, accessory muscle usage and prolonged expiration present. No respiratory distress.     Breath sounds:  Decreased breath sounds and wheezing present.  Chest:     Chest wall: No tenderness.  Abdominal:     General: Bowel sounds are normal.     Palpations: Abdomen is soft.     Tenderness: There is no abdominal tenderness. There is no guarding or rebound. Negative signs include Murphy's sign and McBurney's sign.     Hernia: No hernia is present.  Musculoskeletal:        General: Normal range of motion.     Cervical back: Normal range of motion and neck supple.  Skin:    General: Skin is warm and dry.  Findings: No rash.  Neurological:     Mental Status: He is alert and oriented to person, place, and time.     GCS: GCS eye subscore is 4. GCS verbal subscore is 5. GCS motor subscore is 6.     Cranial Nerves: No cranial nerve deficit.     Sensory: No sensory deficit.     Coordination: Coordination normal.  Psychiatric:        Speech: Speech normal.        Behavior: Behavior normal.        Thought Content: Thought content normal.     ED Results / Procedures / Treatments   Labs (all labs ordered are listed, but only abnormal results are displayed) Labs Reviewed  CBC WITH DIFFERENTIAL/PLATELET - Abnormal; Notable for the following components:      Result Value   WBC 12.6 (*)    Neutro Abs 10.3 (*)    All other components within normal limits  BASIC METABOLIC PANEL - Abnormal; Notable for the following components:   Sodium 133 (*)    Glucose, Bld 131 (*)    All other components within normal limits  RESP PANEL BY RT-PCR (FLU A&B, COVID) [id number]    EKG EKG Interpretation  Date/Time:  Saturday October 30 2020 01:38:01 EDT Ventricular Rate:  84 PR Interval:  159 QRS Duration: 111 QT Interval:  383 QTC Calculation: 453 R Axis:   -50 Text Interpretation: Sinus rhythm Incomplete RBBB and LAFB Abnormal R-wave progression, early transition Baseline wander in lead(s) V5 No significant change since last tracing Confirmed by Orpah Greek [phone]) on 10/30/2020 1:52:02  AM   Radiology DG Chest Port 1 View  Result Date: 10/30/2020 CLINICAL DATA:  60 year old male with cough fever and shortness of breath for 4 days. Smoker. EXAM: PORTABLE CHEST 1 VIEW COMPARISON:  Portable chest 06/11/2019 and earlier. FINDINGS: Portable AP upright view at 0146 hours. Larger lung volumes from prior exams. Mediastinal contours are stable and within normal limits. No pneumothorax, pleural effusion or confluent pulmonary opacity. Mild, coarse new bilateral pulmonary interstitial opacity, greater on the left. Visualized tracheal air column is within normal limits. No acute osseous abnormality identified. Negative visible bowel gas pattern. IMPRESSION: Larger lung volumes with mild, coarse and asymmetric increased pulmonary interstitial opacity since 2020. Top differential considerations are viral/atypical respiratory infection, progression of chronic interstitial lung disease. Electronically Signed   By: Genevie Ann M.D.   On: 10/30/2020 04:25    Procedures Procedures   Medications Ordered in ED Medications  oxyCODONE (Oxy IR/ROXICODONE) immediate release tablet 15 mg (15 mg Oral Given 10/30/20 0336)  methylPREDNISolone sodium succinate (SOLU-MEDROL) 125 mg/2 mL injection 125 mg (125 mg Intravenous Given 10/30/20 0351)  albuterol (PROVENTIL,VENTOLIN) solution continuous neb (10 mg/hr Nebulization Given 10/30/20 0400)    ED Course  I have reviewed the triage vital signs and the nursing notes.  Pertinent labs & imaging results that were available during my care of the patient were reviewed by me and considered in my medical decision making (see chart for details).    MDM Rules/Calculators/A&P                          Patient presents to the emergency department for evaluation of shortness of breath.  Patient does have a history of COPD.  Patient reports that he has been sick for approximately a week.  He started with fever and now has progressively worsening shortness of breath with  productive cough.  Patient in moderate distress at arrival.  COVID is negative.  Flu is negative.  Chest x-ray does not show obvious pneumonia.  Patient administered Solu-Medrol, continuous nebulizer treatment.  He still has moderate dyspnea but is not hypoxic.  Will require hospitalization for further management.  Final Clinical Impression(s) / ED Diagnoses Final diagnoses:  COPD exacerbation [facility])    Rx / DC Orders ED Discharge Orders    None       Etter Royall, Gwenyth Allegra, MD 10/30/20 [phone]

## 2020-10-30 NOTE — ED Triage Notes (Signed)
Pt here for c/o SOB x 1 week. States he has productive cough with yellow sputum. Was seen by PCP yesterday and started on Prednisone, an Antibiotic, and some cough meds. Pt also reports decreased O2 sats in 70's yesterday as well as difficulty urinating for 3 days.

## 2020-10-31 ENCOUNTER — Inpatient Hospital Stay (HOSPITAL_COMMUNITY): Payer: PPO

## 2020-10-31 ENCOUNTER — Encounter (HOSPITAL_COMMUNITY): Payer: Self-pay | Admitting: Family Medicine

## 2020-10-31 DIAGNOSIS — J189 Pneumonia, unspecified organism: Secondary | ICD-10-CM

## 2020-10-31 LAB — CBC
HCT: 38.9 % — ABNORMAL LOW (ref 39.0–52.0)
Hemoglobin: 12.7 g/dL — ABNORMAL LOW (ref 13.0–17.0)
MCH: 30.7 pg (ref 26.0–34.0)
MCHC: 32.6 g/dL (ref 30.0–36.0)
MCV: 94 fL (ref 80.0–100.0)
Platelets: 202 10*3/uL (ref 150–400)
RBC: 4.14 MIL/uL — ABNORMAL LOW (ref 4.22–5.81)
RDW: 13.2 % (ref 11.5–15.5)
WBC: 8.9 10*3/uL (ref 4.0–10.5)
nRBC: 0 % (ref 0.0–0.2)

## 2020-10-31 LAB — COMPREHENSIVE METABOLIC PANEL
ALT: 28 U/L (ref 0–44)
AST: 19 U/L (ref 15–41)
Albumin: 2.7 g/dL — ABNORMAL LOW (ref 3.5–5.0)
Alkaline Phosphatase: 148 U/L — ABNORMAL HIGH (ref 38–126)
Anion gap: 6 (ref 5–15)
BUN: 18 mg/dL (ref 6–20)
CO2: 27 mmol/L (ref 22–32)
Calcium: 8.8 mg/dL — ABNORMAL LOW (ref 8.9–10.3)
Chloride: 106 mmol/L (ref 98–111)
Creatinine, Ser: 0.71 mg/dL (ref 0.61–1.24)
GFR, Estimated: >60 mL/min (ref >60)
Glucose, Bld: 155 mg/dL — ABNORMAL HIGH (ref 70–99)
Potassium: 4.6 mmol/L (ref 3.5–5.1)
Sodium: 139 mmol/L (ref 135–145)
Total Bilirubin: 0.7 mg/dL (ref 0.3–1.2)
Total Protein: 5.6 g/dL — ABNORMAL LOW (ref 6.5–8.1)

## 2020-10-31 LAB — MRSA PCR SCREENING: MRSA by PCR: NEGATIVE

## 2020-10-31 LAB — MAGNESIUM: Magnesium: 2.1 mg/dL (ref 1.7–2.4)

## 2020-10-31 MED ORDER — CHLORHEXIDINE GLUCONATE CLOTH 2 % EX PADS
6.0000 | MEDICATED_PAD | Freq: Every day | CUTANEOUS | Status: DC
  Administered 2020-10-31 – 2020-11-02 (×3): 6 via TOPICAL

## 2020-10-31 MED ORDER — FUROSEMIDE 10 MG/ML IJ SOLN
30.0000 mg | Freq: Every day | INTRAMUSCULAR | Status: DC
  Administered 2020-10-31 – 2020-11-02 (×3): 30 mg via INTRAVENOUS
  Filled 2020-10-31 (×3): qty 4

## 2020-10-31 MED ORDER — IPRATROPIUM-ALBUTEROL 0.5-2.5 (3) MG/3ML IN SOLN: 3.0000 mL | RESPIRATORY_TRACT | Status: DC

## 2020-10-31 MED ORDER — BUDESONIDE 0.5 MG/2ML IN SUSP
0.5000 mg | Freq: Two times a day (BID) | RESPIRATORY_TRACT | Status: DC
  Administered 2020-10-31 – 2020-11-02 (×5): 0.5 mg via RESPIRATORY_TRACT
  Filled 2020-10-31 (×5): qty 2

## 2020-10-31 MED ORDER — ARFORMOTEROL TARTRATE 15 MCG/2ML IN NEBU
15.0000 ug | INHALATION_SOLUTION | Freq: Two times a day (BID) | RESPIRATORY_TRACT | Status: DC
  Administered 2020-10-31 – 2020-11-02 (×5): 15 ug via RESPIRATORY_TRACT
  Filled 2020-10-31 (×5): qty 2

## 2020-10-31 MED ORDER — ENOXAPARIN SODIUM 40 MG/0.4ML IJ SOSY
40.0000 mg | PREFILLED_SYRINGE | INTRAMUSCULAR | Status: DC
  Administered 2020-10-31 – 2020-11-01 (×2): 40 mg via SUBCUTANEOUS
  Filled 2020-10-31 (×2): qty 0.4

## 2020-10-31 MED ORDER — METHYLPREDNISOLONE SODIUM SUCC 125 MG IJ SOLR
80.0000 mg | Freq: Four times a day (QID) | INTRAMUSCULAR | Status: DC
  Administered 2020-10-31 – 2020-11-02 (×8): 80 mg via INTRAVENOUS
  Filled 2020-10-31 (×8): qty 2

## 2020-10-31 MED ORDER — ALBUTEROL SULFATE HFA 108 (90 BASE) MCG/ACT IN AERS
1.0000 | INHALATION_SPRAY | RESPIRATORY_TRACT | Status: DC | PRN
  Filled 2020-10-31: qty 6.7

## 2020-10-31 MED ORDER — IPRATROPIUM-ALBUTEROL 0.5-2.5 (3) MG/3ML IN SOLN
3.0000 mL | Freq: Four times a day (QID) | RESPIRATORY_TRACT | Status: DC
  Administered 2020-10-31 – 2020-11-01 (×6): 3 mL via RESPIRATORY_TRACT
  Filled 2020-10-31 (×6): qty 3

## 2020-10-31 NOTE — Progress Notes (Signed)
PROGRESS NOTE   [name] Carter  OVF:[id number] DOB: 1961-05-15 DOA: 10/30/2020 PCP: Salli Real, MD   Chief Complaint  Patient presents with  . Respiratory Distress   Level of care: Stepdown  Brief Admission History:  60 y.o. male, with history of chronic back pain, hypertension, COPD, recurrent pneumonia, and more presents the ED with a chief complaint of " I have been sick for a week."  Patient reports that he started with dyspnea and fevers a week ago.  He did not get any better.  He reports that he had fevers as high as 103.  The fever seemed to break 3 days ago.  Now he has a cough with thick yellow sputum.  He is coughing so frequently that is scattered stomach pain and chest pain from the cough.  His stomach pain and chest pain are both worse when he coughs, or when he takes a deep breath.  He reports his dyspnea and cough are worse with exertion.  He does have a wheeze at home.  He has been taking his inhalers and they offered temporary relief but then the symptoms return.  He has as needed oxygen at home and has not tried that.  Patient reports associated generalized weakness and fatigue, shortness of breath.  He did call his PCP who prescribed him Levaquin and prednisone 2 days ago.  Patient reports the Levaquin usually clears up his pneumonia.  Patient has no other complaints at this time.  Patient is current smoker and requested nicotine patch.  He does not drink alcohol and does not use illicit drugs.  He is vaccinated for COVID without booster.  He is full code.  In the ED Temp 99.1, heart rate 53-82, respiratory rate 13-24, blood pressure 128/71 Chemistry panel reveals a very slight hyponatremia at 133 Hematology reveals a leukocytosis at 12.6 Respiratory panel negative  Chest x-ray shows large lung volumes with mild coarse asymmetric increased pulmonary interstitial opacity since 2020 possibly infection EKG shows sinus rhythm 84, QTc 453, baseline wander, no ischemic  changes  Assessment & Plan:   Principal Problem:   COPD exacerbation (HCC) Active Problems:   Acute respiratory failure (HCC)   Tobacco dependence   CAP (community acquired pneumonia)   1. Acute on chronic respiratory failure with hypoxia -secondary to severe COPD exacerbation-patient improving as rapidly as before and appear transferring to the stepdown ICU in case he needs to start BiPAP therapy.  Patient reports that he has been intubated multiple times with similar COPD exacerbations and hospitalizations.  I told to consult pulmonologist to see him as well.  Spoke with respiratory therapy placing him on propafenone and Pulmicort nebs increase IV steroids, continue IV antibiotic, continue supportive therapy, BiPAP as needed hopefully will have to intubate this patient but given his history I am sure.  We will continue to follow closely. 2. Tobacco dependence- nicotine patch ordered.  Patient counseled on smoking cessation and the urgent need to stop all tobacco use. 3. Atypical pneumonia-continue IV levofloxacin as ordered.  DVT prophylaxis: Enoxaparin Code Status: Full Family Communication: Telephone call spouse 5/1 Disposition: Transfer to stepdown ICU Status is: Inpatient  Remains inpatient appropriate because:IV treatments appropriate due to intensity of illness or inability to take PO and Inpatient level of care appropriate due to severity of illness   Dispo: The patient is from: Home              Anticipated d/c is to: Home  Patient currently is not medically stable to d/c.   Difficult to place patient No   Consultants:   PCCM  Procedures:     Antimicrobials:  Levofloxacin 4/29>>   Subjective: Patient having increasing shortness of breath and difficulty breathing and speaking in shorter sentences, thick mucus production, diminished from baseline exercise tolerance  Objective: Vitals:   10/31/20 0230 10/31/20 0447 10/31/20 0725 10/31/20 0732  BP:   125/84    Pulse:  60    Resp:  20    Temp:  98.3 F (36.8 C)    TempSrc:      SpO2: 90% 90% 90% 90%  Weight:      Height:        Intake/Output Summary (Last 24 hours) at 10/31/2020 1032 Last data filed at 10/31/2020 0900 Gross per 24 hour  Intake 1261.45 ml  Output --  Net 1261.45 ml   Filed Weights   10/30/20 0140  Weight: 73 kg   Examination:  General exam: Pt mildly distressed, speaking in short sentences, cooperative and calm.  Respiratory system: Moderate increased work of breathing, diffuse inp/exp wheezing, poor air movement, rales heard, wet cough. Cardiovascular system: normal S1 & S2 heard. No JVD, murmurs, rubs, gallops or clicks. No pedal edema. Gastrointestinal system: Abdomen is nondistended, soft and nontender. No organomegaly or masses felt. Normal bowel sounds heard.  Central nervous system: Alert and oriented x3. No focal neurological deficits. Extremities: Symmetric 5 x 5 power. Skin: No rashes, lesions or ulcers Psychiatry: Judgement and insight appear normal. Mood & affect appropriate.   Data Reviewed: I have personally reviewed following labs and imaging studies  CBC: Recent Labs  Lab 10/30/20 0145 10/31/20 0421  WBC 12.6* 8.9  NEUTROABS 10.3*  --   HGB 13.1 12.7*  HCT 39.9 38.9*  MCV 92.1 94.0  PLT 188 202    Basic Metabolic Panel: Recent Labs  Lab 10/30/20 0145 10/31/20 0421  NA 133* 139  K 4.1 4.6  CL 101 106  CO2 23 27  GLUCOSE 131* 155*  BUN 11 18  CREATININE 0.72 0.71  CALCIUM 8.9 8.8*  MG  --  2.1    GFR: Estimated Creatinine Clearance: 99.4 mL/min (by C-G formula based on SCr of 0.71 mg/dL).  Liver Function Tests: Recent Labs  Lab 10/31/20 0421  AST 19  ALT 28  ALKPHOS 148*  BILITOT 0.7  PROT 5.6*  ALBUMIN 2.7*    CBG: No results for input(s): GLUCAP in the last 168 hours.  Recent Results (from the past 240 hour(s))  Resp Panel by RT-PCR (Flu A&B, Covid) Nasopharyngeal Swab     Status: None   Collection  Time: 10/30/20  1:38 AM   Specimen: Nasopharyngeal Swab; Nasopharyngeal(NP) swabs in vial transport medium  Result Value Ref Range Status   SARS Coronavirus 2 by RT PCR NEGATIVE NEGATIVE Final    Comment: (NOTE) SARS-CoV-2 target nucleic acids are NOT DETECTED.  The SARS-CoV-2 RNA is generally detectable in upper respiratory specimens during the acute phase of infection. The lowest concentration of SARS-CoV-2 viral copies this assay can detect is 138 copies/mL. A negative result does not preclude SARS-Cov-2 infection and should not be used as the sole basis for treatment or other patient management decisions. A negative result may occur with  improper specimen collection/handling, submission of specimen other than nasopharyngeal swab, presence of viral mutation(s) within the areas targeted by this assay, and inadequate number of viral copies(<138 copies/mL). A negative result must be combined with clinical observations,  patient history, and epidemiological information. The expected result is Negative.  Fact Sheet for Patients:  BloggerCourse.com  Fact Sheet for Healthcare Providers:  SeriousBroker.it  This test is no t yet approved or cleared by the [place] FDA and  has been authorized for detection and/or diagnosis of SARS-CoV-2 by FDA under an Emergency Use Authorization (EUA). This EUA will remain  in effect (meaning this test can be used) for the duration of the COVID-19 declaration under Section 564(b)(1) of the Act, 21 U.S.C.section 360bbb-3(b)(1), unless the authorization is terminated  or revoked sooner.       Influenza A by PCR NEGATIVE NEGATIVE Final   Influenza B by PCR NEGATIVE NEGATIVE Final    Comment: (NOTE) The Xpert Xpress SARS-CoV-2/FLU/RSV plus assay is intended as an aid in the diagnosis of influenza from Nasopharyngeal swab specimens and should not be used as a sole basis for treatment. Nasal washings  and aspirates are unacceptable for Xpert Xpress SARS-CoV-2/FLU/RSV testing.  Fact Sheet for Patients: BloggerCourse.com  Fact Sheet for Healthcare Providers: SeriousBroker.it  This test is not yet approved or cleared by the [place] FDA and has been authorized for detection and/or diagnosis of SARS-CoV-2 by FDA under an Emergency Use Authorization (EUA). This EUA will remain in effect (meaning this test can be used) for the duration of the COVID-19 declaration under Section 564(b)(1) of the Act, 21 U.S.C. section 360bbb-3(b)(1), unless the authorization is terminated or revoked.  Performed at [facility], [address], [place], [place] [address]      Radiology Studies: US Venous Img Lower Unilateral Right (DVT)  Result Date: 10/30/2020 CLINICAL DATA:  Right lower extremity edema. EXAM: RIGHT LOWER EXTREMITY VENOUS DOPPLER ULTRASOUND TECHNIQUE: Gray-scale sonography with graded compression, as well as color Doppler and duplex ultrasound were performed to evaluate the lower extremity deep venous systems from the level of the common femoral vein and including the common femoral, femoral, profunda femoral, popliteal and calf veins including the posterior tibial, peroneal and gastrocnemius veins when visible. The superficial great saphenous vein was also interrogated. Spectral Doppler was utilized to evaluate flow at rest and with distal augmentation maneuvers in the common femoral, femoral and popliteal veins. COMPARISON:  None. FINDINGS: Contralateral Common Femoral Vein: Respiratory phasicity is normal and symmetric with the symptomatic side. No evidence of thrombus. Normal compressibility. Common Femoral Vein: No evidence of thrombus. Normal compressibility, respiratory phasicity and response to augmentation. Saphenofemoral Junction: No evidence of thrombus. Normal compressibility and flow on color Doppler imaging. Profunda Femoral Vein:  No evidence of thrombus. Normal compressibility and flow on color Doppler imaging. Femoral Vein: No evidence of thrombus. Normal compressibility, respiratory phasicity and response to augmentation. Popliteal Vein: No evidence of thrombus. Normal compressibility, respiratory phasicity and response to augmentation. Calf Veins: Visualized right deep calf veins are patent without thrombus. Other Findings:  None. IMPRESSION: Negative for deep venous thrombosis in right lower extremity. Electronically Signed   By: Richarda Overlie M.D.   On: 10/30/2020 12:35   DG CHEST PORT 1 VIEW  Result Date: 10/31/2020 CLINICAL DATA:  Productive cough and shortness of breath for several days, history of pneumonia and hypertension EXAM: PORTABLE CHEST 1 VIEW COMPARISON:  Radiograph 10/30/2020 FINDINGS: Coarse interstitial opacities are present bilaterally, left greater than right, not significantly changed from most recent comparison. No focal consolidative or confluent opacity is evident. No pneumothorax or visible effusion. Lungs appear somewhat hyperinflated with mild airways thickening as well. Stable cardiomediastinal contours. No acute osseous or soft tissue abnormality.  Telemetry leads overlie the chest. IMPRESSION: Coarsened bilateral interstitial opacities are unchanged from most recent comparison radiograph. Could reflect atypical infection or progression of a chronic interstitial lung disease. Chronic bronchitic changes and hyperinflation likely reflective of known emphysema and COPD. Electronically Signed   By: Kreg ShropshirePrice  DeHay M.D.   On: 10/31/2020 06:54   DG Chest Port 1 View  Result Date: 10/30/2020 CLINICAL DATA:  60 year old male with cough fever and shortness of breath for 4 days. Smoker. EXAM: PORTABLE CHEST 1 VIEW COMPARISON:  Portable chest 06/11/2019 and earlier. FINDINGS: Portable AP upright view at 0146 hours. Larger lung volumes from prior exams. Mediastinal contours are stable and within normal limits. No  pneumothorax, pleural effusion or confluent pulmonary opacity. Mild, coarse new bilateral pulmonary interstitial opacity, greater on the left. Visualized tracheal air column is within normal limits. No acute osseous abnormality identified. Negative visible bowel gas pattern. IMPRESSION: Larger lung volumes with mild, coarse and asymmetric increased pulmonary interstitial opacity since 2020. Top differential considerations are viral/atypical respiratory infection, progression of chronic interstitial lung disease. Electronically Signed   By: Odessa FlemingH  Hall M.D.   On: 10/30/2020 04:25    Scheduled Meds: . aspirin EC  81 mg Oral Daily  . enoxaparin (LOVENOX) injection  40 mg Subcutaneous Q24H  . furosemide  30 mg Intravenous Daily  . guaiFENesin  1,200 mg Oral BID  . ipratropium-albuterol  3 mL Nebulization Q4H  . methylPREDNISolone (SOLU-MEDROL) injection  80 mg Intravenous Q6H  . nicotine  21 mg Transdermal Daily  . oxyCODONE  15 mg Oral Q4H  . rosuvastatin  20 mg Oral Daily  . senna-docusate  2 tablet Oral QHS  . umeclidinium-vilanterol  1 puff Inhalation Daily   Continuous Infusions: . sodium chloride 60 mL/hr at 10/30/20 2334  . levofloxacin (LEVAQUIN) IV 750 mg (10/31/20 0525)    LOS: 1 day   Time spent: 38 mins   Alanta Scobey Laural BenesJohnson, MD How to contact the [facility] Attending or Consulting provider 7A - 7P or covering provider during after hours 7P -7A, for this patient?  1. Check the care team in [facility] and look for a) attending/consulting [facility] provider listed and b) the [facility] team listed 2. Log into [email or web address] and use [facility]'s universal password to access. If you do not have the password, please contact the hospital operator. 3. Locate the [facility] provider you are looking for under [facility] and page to a number that you can be directly reached. 4. If you still have difficulty reaching the provider, please page the [facility] (Director on Call) for the Hospitalists listed on amion for  assistance.  10/31/2020, 10:32 AM

## 2020-10-31 NOTE — Progress Notes (Signed)
Patient is currently on 2L with sats in upper 90s.  Bipap is not needed at this time.

## 2020-10-31 NOTE — Progress Notes (Signed)
Per central tele pt had 11 beats of wide QRS, hospitalist made aware. Pt resting quietly, denies any distress. Call bell in reach.

## 2020-11-01 MED ORDER — ORAL CARE MOUTH RINSE
15.0000 mL | Freq: Two times a day (BID) | OROMUCOSAL | Status: DC
  Administered 2020-11-02 (×2): 15 mL via OROMUCOSAL

## 2020-11-01 MED ORDER — IPRATROPIUM-ALBUTEROL 0.5-2.5 (3) MG/3ML IN SOLN
3.0000 mL | Freq: Three times a day (TID) | RESPIRATORY_TRACT | Status: DC
  Administered 2020-11-02: 3 mL via RESPIRATORY_TRACT
  Filled 2020-11-01: qty 3

## 2020-11-01 NOTE — Progress Notes (Signed)
PROGRESS NOTE   [name] Carter  RFF:[id number] DOB: 07/02/1961 DOA: 10/30/2020 PCP: Salli Real, MD   Chief Complaint  Patient presents with  . Respiratory Distress   Level of care: Stepdown  Brief Admission History:  60 y.o. male, with history of chronic back pain, hypertension, COPD, recurrent pneumonia, and more presents the ED with a chief complaint of " I have been sick for a week."  Patient reports that he started with dyspnea and fevers a week ago.  He did not get any better.  He reports that he had fevers as high as 103.  The fever seemed to break 3 days ago.  Now he has a cough with thick yellow sputum.  He is coughing so frequently that is scattered stomach pain and chest pain from the cough.  His stomach pain and chest pain are both worse when he coughs, or when he takes a deep breath.  He reports his dyspnea and cough are worse with exertion.  He does have a wheeze at home.  He has been taking his inhalers and they offered temporary relief but then the symptoms return.  He has as needed oxygen at home and has not tried that.  Patient reports associated generalized weakness and fatigue, shortness of breath.  He did call his PCP who prescribed him Levaquin and prednisone 2 days ago.  Patient reports the Levaquin usually clears up his pneumonia.  Patient has no other complaints at this time.  Patient is current smoker and requested nicotine patch.  He does not drink alcohol and does not use illicit drugs.  He is vaccinated for COVID without booster.  He is full code.  In the ED Temp 99.1, heart rate 53-82, respiratory rate 13-24, blood pressure 128/71 Chemistry panel reveals a very slight hyponatremia at 133 Hematology reveals a leukocytosis at 12.6 Respiratory panel negative Chest x-ray shows large lung volumes with mild coarse asymmetric increased pulmonary interstitial opacity since 2020 possibly infection EKG shows sinus rhythm 84, QTc 453, baseline wander, no ischemic  changes  Assessment & Plan:   Principal Problem:   COPD exacerbation (HCC) Active Problems:   Acute respiratory failure (HCC)   Tobacco dependence   CAP (community acquired pneumonia)  1. Acute on chronic respiratory failure with hypoxia -secondary to severe COPD exacerbation-patient not improving as we hoped and we transferred him to the stepdown ICU in case he needs to start BiPAP therapy and as last resort intubated.  Patient reports that he has been intubated at least 5 times with similar COPD exacerbations and hospitalizations.  I consult pulmonologist to see him as well.  Spoke with respiratory therapy placing him on brovana and Pulmicort nebs increased IV steroids, continue IV antibiotic, continue supportive therapy, BiPAP as needed hopefully will NOT have to intubate this patient but given his history I am not sure.  We will continue to follow closely.  Add flutter valve.  2. Tobacco dependence- nicotine patch ordered.  Patient counseled on smoking cessation and the urgent need to stop all tobacco use. 3. Atypical pneumonia-continue IV levofloxacin as ordered.  DVT prophylaxis: Enoxaparin Code Status: Full Family Communication: Telephone call spouse 5/1 Disposition: Transfer to stepdown ICU Status is: Inpatient  Remains inpatient appropriate because:IV treatments appropriate due to intensity of illness or inability to take PO and Inpatient level of care appropriate due to severity of illness  Dispo: The patient is from: Home              Anticipated d/c is to:  Home              Patient currently is not medically stable to d/c.   Difficult to place patient No   Consultants:   PCCM  Procedures:   Antimicrobials:  Levofloxacin 4/29>>   Subjective: Patient having increasing shortness of breath and difficulty breathing and speaking in shorter sentences, thick mucus production, diminished from baseline exercise tolerance  Objective: Vitals:   11/01/20 0500 11/01/20 0728  11/01/20 0800 11/01/20 1116  BP: 139/78  (!) 165/93   Pulse: (!) 55 (!) 55 64 63  Resp: 15 12 17 16   Temp:  97.6 F (36.4 C)  98.4 F (36.9 C)  TempSrc:  Oral  Oral  SpO2: 94% 96% 93% 96%  Weight: 69.4 kg     Height:        Intake/Output Summary (Last 24 hours) at 11/01/2020 1208 Last data filed at 11/01/2020 1130 Gross per 24 hour  Intake 2904.89 ml  Output 3250 ml  Net -345.11 ml   Filed Weights   10/30/20 0140 11/01/20 0500  Weight: 73 kg 69.4 kg   Examination:  General exam: Pt able to speak in longer sentences, cooperative and calm.  Respiratory system: Moderate increased work of breathing, diffuse inp/exp wheezing, poor air movement, rales heard, wet cough. Cardiovascular system: normal S1 & S2 heard. No JVD, murmurs, rubs, gallops or clicks. No pedal edema. Gastrointestinal system: Abdomen is nondistended, soft and nontender. No organomegaly or masses felt. Normal bowel sounds heard.  Central nervous system: Alert and oriented x3. No focal neurological deficits. Extremities: Symmetric 5 x 5 power. Skin: No rashes, lesions or ulcers Psychiatry: Judgement and insight appear normal. Mood & affect appropriate.   Data Reviewed: I have personally reviewed following labs and imaging studies  CBC: Recent Labs  Lab 10/30/20 0145 10/31/20 0421  WBC 12.6* 8.9  NEUTROABS 10.3*  --   HGB 13.1 12.7*  HCT 39.9 38.9*  MCV 92.1 94.0  PLT 188 202    Basic Metabolic Panel: Recent Labs  Lab 10/30/20 0145 10/31/20 0421  NA 133* 139  K 4.1 4.6  CL 101 106  CO2 23 27  GLUCOSE 131* 155*  BUN 11 18  CREATININE 0.72 0.71  CALCIUM 8.9 8.8*  MG  --  2.1    GFR: Estimated Creatinine Clearance: 97.6 mL/min (by C-G formula based on SCr of 0.71 mg/dL).  Liver Function Tests: Recent Labs  Lab 10/31/20 0421  AST 19  ALT 28  ALKPHOS 148*  BILITOT 0.7  PROT 5.6*  ALBUMIN 2.7*    CBG: No results for input(s): GLUCAP in the last 168 hours.  Recent Results (from the  past 240 hour(s))  Resp Panel by RT-PCR (Flu A&B, Covid) Nasopharyngeal Swab     Status: None   Collection Time: 10/30/20  1:38 AM   Specimen: Nasopharyngeal Swab; Nasopharyngeal(NP) swabs in vial transport medium  Result Value Ref Range Status   SARS Coronavirus 2 by RT PCR NEGATIVE NEGATIVE Final    Comment: (NOTE) SARS-CoV-2 target nucleic acids are NOT DETECTED.  The SARS-CoV-2 RNA is generally detectable in upper respiratory specimens during the acute phase of infection. The lowest concentration of SARS-CoV-2 viral copies this assay can detect is 138 copies/mL. A negative result does not preclude SARS-Cov-2 infection and should not be used as the sole basis for treatment or other patient management decisions. A negative result may occur with  improper specimen collection/handling, submission of specimen other than nasopharyngeal swab, presence of viral  mutation(s) within the areas targeted by this assay, and inadequate number of viral copies(<138 copies/mL). A negative result must be combined with clinical observations, patient history, and epidemiological information. The expected result is Negative.  Fact Sheet for Patients:  BloggerCourse.com  Fact Sheet for Healthcare Providers:  SeriousBroker.it  This test is no t yet approved or cleared by the [place] FDA and  has been authorized for detection and/or diagnosis of SARS-CoV-2 by FDA under an Emergency Use Authorization (EUA). This EUA will remain  in effect (meaning this test can be used) for the duration of the COVID-19 declaration under Section 564(b)(1) of the Act, 21 U.S.C.section 360bbb-3(b)(1), unless the authorization is terminated  or revoked sooner.       Influenza A by PCR NEGATIVE NEGATIVE Final   Influenza B by PCR NEGATIVE NEGATIVE Final    Comment: (NOTE) The Xpert Xpress SARS-CoV-2/FLU/RSV plus assay is intended as an aid in the diagnosis of  influenza from Nasopharyngeal swab specimens and should not be used as a sole basis for treatment. Nasal washings and aspirates are unacceptable for Xpert Xpress SARS-CoV-2/FLU/RSV testing.  Fact Sheet for Patients: BloggerCourse.com  Fact Sheet for Healthcare Providers: SeriousBroker.it  This test is not yet approved or cleared by the [place] FDA and has been authorized for detection and/or diagnosis of SARS-CoV-2 by FDA under an Emergency Use Authorization (EUA). This EUA will remain in effect (meaning this test can be used) for the duration of the COVID-19 declaration under Section 564(b)(1) of the Act, 21 U.S.C. section 360bbb-3(b)(1), unless the authorization is terminated or revoked.  Performed at [facility], [address], [place], [place] [address]   MRSA PCR Screening     Status: None   Collection Time: 10/31/20 12:00 PM   Specimen: Nasopharyngeal  Result Value Ref Range Status   MRSA by PCR NEGATIVE NEGATIVE Final    Comment:        The GeneXpert MRSA Assay (FDA approved for NASAL specimens only), is one component of a comprehensive MRSA colonization surveillance program. It is not intended to diagnose MRSA infection nor to guide or monitor treatment for MRSA infections. Performed at [facility], [address] [place], [place] [address]      Radiology Studies: DG CHEST PORT 1 VIEW  Result Date: 10/31/2020 CLINICAL DATA:  Productive cough and shortness of breath for several days, history of pneumonia and hypertension EXAM: PORTABLE CHEST 1 VIEW COMPARISON:  Radiograph 10/30/2020 FINDINGS: Coarse interstitial opacities are present bilaterally, left greater than right, not significantly changed from most recent comparison. No focal consolidative or confluent opacity is evident. No pneumothorax or visible effusion. Lungs appear somewhat hyperinflated with mild airways thickening as well. Stable  cardiomediastinal contours. No acute osseous or soft tissue abnormality. Telemetry leads overlie the chest. IMPRESSION: Coarsened bilateral interstitial opacities are unchanged from most recent comparison radiograph. Could reflect atypical infection or progression of a chronic interstitial lung disease. Chronic bronchitic changes and hyperinflation likely reflective of known emphysema and COPD. Electronically Signed   By: Kreg Shropshire M.D.   On: 10/31/2020 06:54    Scheduled Meds: . arformoterol  15 mcg Nebulization BID  . aspirin EC  81 mg Oral Daily  . budesonide (PULMICORT) nebulizer solution  0.5 mg Nebulization BID  . Chlorhexidine Gluconate Cloth  6 each Topical Daily  . enoxaparin (LOVENOX) injection  40 mg Subcutaneous Q24H  . furosemide  30 mg Intravenous Daily  . guaiFENesin  1,200 mg Oral BID  . ipratropium-albuterol  3 mL Nebulization Q6H  . methylPREDNISolone (SOLU-MEDROL) injection  80 mg Intravenous Q6H  . nicotine  21 mg Transdermal Daily  . oxyCODONE  15 mg Oral Q4H  . rosuvastatin  20 mg Oral Daily  . senna-docusate  2 tablet Oral QHS   Continuous Infusions: . sodium chloride 60 mL/hr at 10/30/20 2334  . levofloxacin (LEVAQUIN) IV 750 mg (11/01/20 0540)    LOS: 2 days   Time spent: 35 mins   Tyjanae Bartek Laural Benes, MD How to contact the [facility] Attending or Consulting provider 7A - 7P or covering provider during after hours 7P -7A, for this patient?  1. Check the care team in [facility] and look for a) attending/consulting [facility] provider listed and b) the [facility] team listed 2. Log into [email or web address] and use [place]'s universal password to access. If you do not have the password, please contact the hospital operator. 3. Locate the [facility] provider you are looking for under [facility] and page to a number that you can be directly reached. 4. If you still have difficulty reaching the provider, please page the [facility] (Director on Call) for the Hospitalists listed on amion for  assistance.  11/01/2020, 12:08 PM

## 2020-11-01 NOTE — Progress Notes (Signed)
Bipap is PRN order, not needed at this time, no distress present.

## 2020-11-02 DIAGNOSIS — J441 Chronic obstructive pulmonary disease with (acute) exacerbation: Principal | ICD-10-CM

## 2020-11-02 DIAGNOSIS — F17200 Nicotine dependence, unspecified, uncomplicated: Secondary | ICD-10-CM

## 2020-11-02 DIAGNOSIS — J9601 Acute respiratory failure with hypoxia: Secondary | ICD-10-CM

## 2020-11-02 MED ORDER — GUAIFENESIN ER 600 MG PO TB12: 1200.0000 mg | ORAL_TABLET | Freq: Two times a day (BID) | ORAL | 0 refills | Status: AC

## 2020-11-02 MED ORDER — TRELEGY ELLIPTA 100-62.5-25 MCG/INH IN AEPB: 1.0000 | INHALATION_SPRAY | Freq: Every day | RESPIRATORY_TRACT | 1 refills | Status: AC

## 2020-11-02 MED ORDER — PANTOPRAZOLE SODIUM 40 MG PO TBEC: 40.0000 mg | DELAYED_RELEASE_TABLET | Freq: Every day | ORAL | 0 refills | Status: DC

## 2020-11-02 MED ORDER — PREDNISONE 20 MG PO TABS: ORAL_TABLET | ORAL | 0 refills | Status: DC

## 2020-11-02 MED ORDER — PANTOPRAZOLE SODIUM 40 MG PO TBEC
40.0000 mg | DELAYED_RELEASE_TABLET | Freq: Two times a day (BID) | ORAL | Status: DC
  Administered 2020-11-02: 40 mg via ORAL
  Filled 2020-11-02: qty 1

## 2020-11-02 NOTE — Progress Notes (Signed)
Discharge instructions reviewed with patient. Patient verbalized understanding of instructions, patient discharged home with family in stable condition.

## 2020-11-02 NOTE — Discharge Instructions (Signed)
Chronic Obstructive Pulmonary Disease Exacerbation Chronic obstructive pulmonary disease (COPD) is a long-term (chronic) lung problem. In COPD, the flow of air from the lungs is limited. COPD exacerbations are times that breathing gets worse and you need more than your normal treatment. Without treatment, they can be life threatening. If they happen often, your lungs can become more damaged. If your COPD gets worse, your doctor may treat you with:  Medicines.  Oxygen.  Different ways to clear your airway, such as using a mask. Follow these instructions at home: Medicines  Take over-the-counter and prescription medicines only as told by your doctor.  If you take an antibiotic or steroid medicine, do not stop taking the medicine even if you start to feel better.  Keep up with shots (vaccinations) as told by your doctor. Be sure to get a yearly (annual) flu shot. Lifestyle  Do not smoke. If you need help quitting, ask your doctor.  Eat healthy foods.  Exercise regularly.  Get plenty of sleep.  Avoid tobacco smoke and other things that can bother your lungs.  Wash your hands often with soap and water. This will help keep you from getting an infection. If you cannot use soap and water, use hand sanitizer.  During flu season, avoid areas that are crowded with people. General instructions  Drink enough fluid to keep your pee (urine) clear or pale yellow. Do not do this if your doctor has told you not to.  Use a cool mist machine (vaporizer).  If you use oxygen or a machine that turns medicine into a mist (nebulizer), continue to use it as told.  Follow all instructions for rehabilitation. These are steps you can take to make your body work better.  Keep all follow-up visits as told by your doctor. This is important. Contact a doctor if:  Your COPD symptoms get worse than normal. Get help right away if:  You are short of breath and it gets worse.  You have trouble  talking.  You have chest pain.  You cough up blood.  You have a fever.  You keep throwing up (vomiting).  You feel weak or you pass out (faint).  You feel confused.  You are not able to sleep because of your symptoms.  You are not able to do daily activities. Summary  COPD exacerbations are times that breathing gets worse and you need more treatment than normal.  COPD exacerbations can be very serious and may cause your lungs to become more damaged.  Do not smoke. If you need help quitting, ask your doctor.  Stay up-to-date on your shots. Get a flu shot every year. This information is not intended to replace advice given to you by your health care provider. Make sure you discuss any questions you have with your health care provider. Document Revised: 06/01/2017 Document Reviewed: 07/24/2016 Elsevier Patient Education  2021 Elsevier Inc.   IMPORTANT INFORMATION: PAY CLOSE ATTENTION   PHYSICIAN DISCHARGE INSTRUCTIONS  Follow with Primary care provider  Salli Real, MD  and other consultants as instructed by your Hospitalist Physician  SEEK MEDICAL CARE OR RETURN TO EMERGENCY ROOM IF SYMPTOMS COME BACK, WORSEN OR NEW PROBLEM DEVELOPS   Please note: You were cared for by a hospitalist during your hospital stay. Every effort will be made to forward records to your primary care provider.  You can request that your primary care provider send for your hospital records if they have not received them.  Once you are discharged, your primary  care physician will handle any further medical issues. Please note that NO REFILLS for any discharge medications will be authorized once you are discharged, as it is imperative that you return to your primary care physician (or establish a relationship with a primary care physician if you do not have one) for your post hospital discharge needs so that they can reassess your need for medications and monitor your lab values.  Please get a complete blood  count and chemistry panel checked by your Primary MD at your next visit, and again as instructed by your Primary MD.  Get Medicines reviewed and adjusted: Please take all your medications with you for your next visit with your Primary MD  Laboratory/radiological data: Please request your Primary MD to go over all hospital tests and procedure/radiological results at the follow up, please ask your primary care provider to get all Hospital records sent to his/her office.  In some cases, they will be blood work, cultures and biopsy results pending at the time of your discharge. Please request that your primary care provider follow up on these results.  If you are diabetic, please bring your blood sugar readings with you to your follow up appointment with primary care.    Please call and make your follow up appointments as soon as possible.    Also Note the following: If you experience worsening of your admission symptoms, develop shortness of breath, life threatening emergency, suicidal or homicidal thoughts you must seek medical attention immediately by calling 911 or calling your MD immediately  if symptoms less severe.  You must read complete instructions/literature along with all the possible adverse reactions/side effects for all the Medicines you take and that have been prescribed to you. Take any new Medicines after you have completely understood and accpet all the possible adverse reactions/side effects.   Do not drive when taking Pain medications or sleeping medications (Benzodiazepines)  Do not take more than prescribed Pain, Sleep and Anxiety Medications. It is not advisable to combine anxiety,sleep and pain medications without talking with your primary care practitioner  Special Instructions: If you have smoked or chewed Tobacco  in the last 2 yrs please stop smoking, stop any regular Alcohol  and or any Recreational drug use.  Wear Seat belts while driving.  Do not drive if taking  any narcotic, mind altering or controlled substances or recreational drugs or alcohol.

## 2020-11-02 NOTE — Discharge Summary (Addendum)
Physician Discharge Summary  Jax Kentner WUJ:[id number] DOB: 07-11-1960 DOA: 10/30/2020  PCP: Salli Real, MD  Admit date: 10/30/2020 Discharge date: 11/02/2020  Admitted From:  Home  Disposition:  Home   Recommendations for Outpatient Follow-up:  1. Follow up with PCP in 1 weeks 2. Establish care with pulmonary clinic 3. Please continue to encourage tobacco cessation 4. Please follow up alpha 1 antitrypsin testing ordered by inpatient pulmonologist  Discharge Condition: STABLE   CODE STATUS: FULL Diet: Heart Healthy    Brief Hospitalization Summary: Please see all hospital notes, images, labs for full details of the hospitalization. ADMISSION HPI:  Brief Admission History:  60 y.o.male,with history of chronic back pain, hypertension, COPD, recurrent pneumonia, and more presents the ED with a chief complaint of "I have been sick for a week."Patient reports that he started with dyspnea and fevers a week ago. He did not get any better. He reports that he had fevers as high as 103. The fever seemed to break 3 days ago. Now he has a cough with thick yellow sputum. He is coughing so frequently that is scattered stomach pain and chest pain from the cough. His stomach pain and chest pain are both worse when he coughs, or when he takes a deep breath. He reports his dyspnea and cough are worse with exertion. He does have a wheeze at home. He has been taking his inhalers and they offered temporary relief but then the symptoms return. He has as needed oxygen at home and has not tried that. Patient reports associated generalized weakness and fatigue, shortness of breath. He did call his PCP who prescribed him Levaquin and prednisone 2 days ago. Patient reports the Levaquin usually clears up his pneumonia. Patient has no other complaints at this time.  Patient is current smoker and requested nicotine patch. He does not drink alcohol and does not use illicit drugs. He is vaccinated for  COVID without booster. He is full code.  In the ED Temp 99.1, heart rate 53-82, respiratory rate 13-24, blood pressure 128/71 Chemistry panel reveals a very slight hyponatremia at 133 Hematology reveals a leukocytosis at 12.6 Respiratory panel negative Chest x-ray shows large lung volumes with mild coarse asymmetric increased pulmonary interstitial opacity since 2020 possibly infection EKG shows sinus rhythm 84, QTc 453, baseline wander, no ischemic changes  Hospital Course:   1. Acute on chronic respiratory failure with hypoxia -secondary to severe COPD exacerbation-patient initially was not improving as we hoped and we transferred him to the stepdown ICU in case he needed to start BiPAP therapy and as last resort intubated.  Fortunately he improved and did not require bipap or intubation.  RT placed him on brovana and pulmicort nebs and he responded well to that.  He says he feels back to his baseline.  Pulmonary saw him and they recommended trial of Trelegy inhaler and outpatient follow up.  Ambulatory referral made for pulmonology.  Patient reports that he has been intubated at least 5 times with similar COPD exacerbations and hospitalizations. He is on 1L [place] oxygen now.  He is stable to discharge home with outpatient follow up.   Pt will discharge with a steroid taper and will complete course of levofloxacin.   2. Tobacco dependence- nicotine patch ordered.  Patient counseled on smoking cessation and the urgent need to stop all tobacco use.  Pt verbalized understanding.  3. Atypical pneumonia-treated with levofloxacin. .  DVT prophylaxis: Enoxaparin Code Status: Full Family Communication: Telephone call spouse 5/1 Disposition: Transfer  to stepdown ICU Status is: Inpatient  Discharge Diagnoses:  Principal Problem:   COPD exacerbation (HCC) Active Problems:   Acute respiratory failure (HCC)   Tobacco dependence   CAP (community acquired pneumonia)  Discharge  Instructions: Discharge Instructions    Ambulatory referral to Pulmonology   Complete by: As directed    Reason for referral: Asthma/COPD     Allergies as of 11/02/2020      Reactions   Vicodin [hydrocodone-acetaminophen] Nausea And Vomiting, Other (See Comments)   Made patient feel funny, stomach pain      Medication List    STOP taking these medications   IBU 800 MG tablet Generic drug: ibuprofen   predniSONE 10 MG (21) Tbpk tablet Commonly known as: STERAPRED UNI-PAK 21 TAB Replaced by: predniSONE 20 MG tablet     TAKE these medications   albuterol 108 (90 Base) MCG/ACT inhaler Commonly known as: VENTOLIN HFA Inhale 2 puffs into the lungs every 4 (four) hours as needed for wheezing or shortness of breath.   albuterol (2.5 MG/3ML) 0.083% nebulizer solution Commonly known as: PROVENTIL Take 3 mLs (2.5 mg total) by nebulization every 6 (six) hours as needed for wheezing or shortness of breath.   benzonatate 200 MG capsule Commonly known as: TESSALON Take 200 mg by mouth 3 (three) times daily as needed for cough.   guaiFENesin 600 MG 12 hr tablet Commonly known as: MUCINEX Take 2 tablets (1,200 mg total) by mouth 2 (two) times daily for 5 days.   levofloxacin 500 MG tablet Commonly known as: LEVAQUIN Take 500 mg by mouth daily. Started 4/28 x7DS   oxyCODONE 15 MG immediate release tablet Commonly known as: ROXICODONE Take 1 tablet by mouth every 4 (four) hours.   pantoprazole 40 MG tablet Commonly known as: PROTONIX Take 1 tablet (40 mg total) by mouth daily.   predniSONE 20 MG tablet Commonly known as: DELTASONE Take 3 PO QAM x5days, 2 PO QAM x5days, 1 PO QAM x5days Replaces: predniSONE 10 MG (21) Tbpk tablet   Trelegy Ellipta 100-62.5-25 MCG/INH Aepb Generic drug: Fluticasone-Umeclidin-Vilant Inhale 1 puff into the lungs daily.       Follow-up Information    Salli RealSun, Yun, MD. Schedule an appointment as soon as possible for a visit in 1 week(s).    Specialty: Internal Medicine Contact information: [address] [place] [address] [phone]        Antoine PocheBranch, Jonathan F, MD .   Specialty: Cardiology Contact information: [address] [place] [id number] [phone]              Allergies  Allergen Reactions  . Vicodin [Hydrocodone-Acetaminophen] Nausea And Vomiting and Other (See Comments)    Made patient feel funny, stomach pain   Allergies as of 11/02/2020      Reactions   Vicodin [hydrocodone-acetaminophen] Nausea And Vomiting, Other (See Comments)   Made patient feel funny, stomach pain      Medication List    STOP taking these medications   IBU 800 MG tablet Generic drug: ibuprofen   predniSONE 10 MG (21) Tbpk tablet Commonly known as: STERAPRED UNI-PAK 21 TAB Replaced by: predniSONE 20 MG tablet     TAKE these medications   albuterol 108 (90 Base) MCG/ACT inhaler Commonly known as: VENTOLIN HFA Inhale 2 puffs into the lungs every 4 (four) hours as needed for wheezing or shortness of breath.   albuterol (2.5 MG/3ML) 0.083% nebulizer solution Commonly known as: PROVENTIL Take 3 mLs (2.5 mg total) by nebulization  every 6 (six) hours as needed for wheezing or shortness of breath.   benzonatate 200 MG capsule Commonly known as: TESSALON Take 200 mg by mouth 3 (three) times daily as needed for cough.   guaiFENesin 600 MG 12 hr tablet Commonly known as: MUCINEX Take 2 tablets (1,200 mg total) by mouth 2 (two) times daily for 5 days.   levofloxacin 500 MG tablet Commonly known as: LEVAQUIN Take 500 mg by mouth daily. Started 4/28 x7DS   oxyCODONE 15 MG immediate release tablet Commonly known as: ROXICODONE Take 1 tablet by mouth every 4 (four) hours.   pantoprazole 40 MG tablet Commonly known as: PROTONIX Take 1 tablet (40 mg total) by mouth daily.   predniSONE 20 MG tablet Commonly known as: DELTASONE Take 3 PO QAM x5days, 2 PO QAM x5days, 1 PO QAM x5days Replaces:  predniSONE 10 MG (21) Tbpk tablet   Trelegy Ellipta 100-62.5-25 MCG/INH Aepb Generic drug: Fluticasone-Umeclidin-Vilant Inhale 1 puff into the lungs daily.       Procedures/Studies: US Venous Img Lower Unilateral Right (DVT)  Result Date: 10/30/2020 CLINICAL DATA:  Right lower extremity edema. EXAM: RIGHT LOWER EXTREMITY VENOUS DOPPLER ULTRASOUND TECHNIQUE: Gray-scale sonography with graded compression, as well as color Doppler and duplex ultrasound were performed to evaluate the lower extremity deep venous systems from the level of the common femoral vein and including the common femoral, femoral, profunda femoral, popliteal and calf veins including the posterior tibial, peroneal and gastrocnemius veins when visible. The superficial great saphenous vein was also interrogated. Spectral Doppler was utilized to evaluate flow at rest and with distal augmentation maneuvers in the common femoral, femoral and popliteal veins. COMPARISON:  None. FINDINGS: Contralateral Common Femoral Vein: Respiratory phasicity is normal and symmetric with the symptomatic side. No evidence of thrombus. Normal compressibility. Common Femoral Vein: No evidence of thrombus. Normal compressibility, respiratory phasicity and response to augmentation. Saphenofemoral Junction: No evidence of thrombus. Normal compressibility and flow on color Doppler imaging. Profunda Femoral Vein: No evidence of thrombus. Normal compressibility and flow on color Doppler imaging. Femoral Vein: No evidence of thrombus. Normal compressibility, respiratory phasicity and response to augmentation. Popliteal Vein: No evidence of thrombus. Normal compressibility, respiratory phasicity and response to augmentation. Calf Veins: Visualized right deep calf veins are patent without thrombus. Other Findings:  None. IMPRESSION: Negative for deep venous thrombosis in right lower extremity. Electronically Signed   By: Richarda Overlie M.D.   On: 10/30/2020 12:35   DG  CHEST PORT 1 VIEW  Result Date: 10/31/2020 CLINICAL DATA:  Productive cough and shortness of breath for several days, history of pneumonia and hypertension EXAM: PORTABLE CHEST 1 VIEW COMPARISON:  Radiograph 10/30/2020 FINDINGS: Coarse interstitial opacities are present bilaterally, left greater than right, not significantly changed from most recent comparison. No focal consolidative or confluent opacity is evident. No pneumothorax or visible effusion. Lungs appear somewhat hyperinflated with mild airways thickening as well. Stable cardiomediastinal contours. No acute osseous or soft tissue abnormality. Telemetry leads overlie the chest. IMPRESSION: Coarsened bilateral interstitial opacities are unchanged from most recent comparison radiograph. Could reflect atypical infection or progression of a chronic interstitial lung disease. Chronic bronchitic changes and hyperinflation likely reflective of known emphysema and COPD. Electronically Signed   By: Kreg Shropshire M.D.   On: 10/31/2020 06:54   DG Chest Port 1 View  Result Date: 10/30/2020 CLINICAL DATA:  60 year old male with cough fever and shortness of breath for 4 days. Smoker. EXAM: PORTABLE CHEST 1 VIEW COMPARISON:  Portable chest  06/11/2019 and earlier. FINDINGS: Portable AP upright view at 0146 hours. Larger lung volumes from prior exams. Mediastinal contours are stable and within normal limits. No pneumothorax, pleural effusion or confluent pulmonary opacity. Mild, coarse new bilateral pulmonary interstitial opacity, greater on the left. Visualized tracheal air column is within normal limits. No acute osseous abnormality identified. Negative visible bowel gas pattern. IMPRESSION: Larger lung volumes with mild, coarse and asymmetric increased pulmonary interstitial opacity since 2020. Top differential considerations are viral/atypical respiratory infection, progression of chronic interstitial lung disease. Electronically Signed   By: Odessa Fleming M.D.   On:  10/30/2020 04:25      Subjective: Pt reports he wants to go home, he feels well like at his baseline.    Discharge Exam: Vitals:   11/02/20 0447 11/02/20 0711  BP: (!) 151/86   Pulse: (!) 51   Resp: 19   Temp: 97.8 F (36.6 C)   SpO2: 93% 99%   Vitals:   11/01/20 2000 11/02/20 0447 11/02/20 0500 11/02/20 0711  BP:  (!) 151/86    Pulse:  (!) 51    Resp:  19    Temp: 97.8 F (36.6 C) 97.8 F (36.6 C)    TempSrc: Oral Oral    SpO2:  93%  99%  Weight:   70.1 kg   Height:       General exam: Pt able to speak in longer sentences, cooperative and calm.  Respiratory system: bilateral breath sounds with rare exp wheezes heard but no increased work of breathing.  Cardiovascular system: normal S1 & S2 heard. No JVD, murmurs, rubs, gallops or clicks. No pedal edema. Gastrointestinal system: Abdomen is nondistended, soft and nontender. No organomegaly or masses felt. Normal bowel sounds heard.  Central nervous system: Alert and oriented x3. No focal neurological deficits. Extremities: Symmetric 5 x 5 power. Skin: No rashes, lesions or ulcers Psychiatry: Judgement and insight appear normal. Mood & affect appropriate.    The results of significant diagnostics from this hospitalization (including imaging, microbiology, ancillary and laboratory) are listed below for reference.     Microbiology: Recent Results (from the past 240 hour(s))  Resp Panel by RT-PCR (Flu A&B, Covid) Nasopharyngeal Swab     Status: None   Collection Time: 10/30/20  1:38 AM   Specimen: Nasopharyngeal Swab; Nasopharyngeal(NP) swabs in vial transport medium  Result Value Ref Range Status   SARS Coronavirus 2 by RT PCR NEGATIVE NEGATIVE Final    Comment: (NOTE) SARS-CoV-2 target nucleic acids are NOT DETECTED.  The SARS-CoV-2 RNA is generally detectable in upper respiratory specimens during the acute phase of infection. The lowest concentration of SARS-CoV-2 viral copies this assay can detect is 138  copies/mL. A negative result does not preclude SARS-Cov-2 infection and should not be used as the sole basis for treatment or other patient management decisions. A negative result may occur with  improper specimen collection/handling, submission of specimen other than nasopharyngeal swab, presence of viral mutation(s) within the areas targeted by this assay, and inadequate number of viral copies(<138 copies/mL). A negative result must be combined with clinical observations, patient history, and epidemiological information. The expected result is Negative.  Fact Sheet for Patients:  BloggerCourse.com  Fact Sheet for Healthcare Providers:  SeriousBroker.it  This test is no t yet approved or cleared by the [place] FDA and  has been authorized for detection and/or diagnosis of SARS-CoV-2 by FDA under an Emergency Use Authorization (EUA). This EUA will remain  in effect (meaning this test can  be used) for the duration of the COVID-19 declaration under Section 564(b)(1) of the Act, 21 U.S.C.section 360bbb-3(b)(1), unless the authorization is terminated  or revoked sooner.       Influenza A by PCR NEGATIVE NEGATIVE Final   Influenza B by PCR NEGATIVE NEGATIVE Final    Comment: (NOTE) The Xpert Xpress SARS-CoV-2/FLU/RSV plus assay is intended as an aid in the diagnosis of influenza from Nasopharyngeal swab specimens and should not be used as a sole basis for treatment. Nasal washings and aspirates are unacceptable for Xpert Xpress SARS-CoV-2/FLU/RSV testing.  Fact Sheet for Patients: BloggerCourse.com  Fact Sheet for Healthcare Providers: SeriousBroker.it  This test is not yet approved or cleared by the [place] FDA and has been authorized for detection and/or diagnosis of SARS-CoV-2 by FDA under an Emergency Use Authorization (EUA). This EUA will remain in effect (meaning  this test can be used) for the duration of the COVID-19 declaration under Section 564(b)(1) of the Act, 21 U.S.C. section 360bbb-3(b)(1), unless the authorization is terminated or revoked.  Performed at [facility], [address] [place], [place] [address]   MRSA PCR Screening     Status: None   Collection Time: 10/31/20 12:00 PM   Specimen: Nasopharyngeal  Result Value Ref Range Status   MRSA by PCR NEGATIVE NEGATIVE Final    Comment:        The GeneXpert MRSA Assay (FDA approved for NASAL specimens only), is one component of a comprehensive MRSA colonization surveillance program. It is not intended to diagnose MRSA infection nor to guide or monitor treatment for MRSA infections. Performed at [facility], [address] [place], [place] [address]      Labs: BNP (last 3 results) No results for input(s): BNP in the last 8760 hours. Basic Metabolic Panel: Recent Labs  Lab 10/30/20 0145 10/31/20 0421  NA 133* 139  K 4.1 4.6  CL 101 106  CO2 23 27  GLUCOSE 131* 155*  BUN 11 18  CREATININE 0.72 0.71  CALCIUM 8.9 8.8*  MG  --  2.1   Liver Function Tests: Recent Labs  Lab 10/31/20 0421  AST 19  ALT 28  ALKPHOS 148*  BILITOT 0.7  PROT 5.6*  ALBUMIN 2.7*   No results for input(s): LIPASE, AMYLASE in the last 168 hours. No results for input(s): AMMONIA in the last 168 hours. CBC: Recent Labs  Lab 10/30/20 0145 10/31/20 0421  WBC 12.6* 8.9  NEUTROABS 10.3*  --   HGB 13.1 12.7*  HCT 39.9 38.9*  MCV 92.1 94.0  PLT 188 202   Cardiac Enzymes: No results for input(s): CKTOTAL, CKMB, CKMBINDEX, TROPONINI in the last 168 hours. BNP: Invalid input(s): POCBNP CBG: No results for input(s): GLUCAP in the last 168 hours. D-Dimer No results for input(s): DDIMER in the last 72 hours. Hgb A1c No results for input(s): HGBA1C in the last 72 hours. Lipid Profile No results for input(s): CHOL, HDL, LDLCALC, TRIG, CHOLHDL, LDLDIRECT in the last 72 hours. Thyroid  function studies No results for input(s): TSH, T4TOTAL, T3FREE, THYROIDAB in the last 72 hours.  Invalid input(s): FREET3 Anemia work up No results for input(s): VITAMINB12, FOLATE, FERRITIN, TIBC, IRON, RETICCTPCT in the last 72 hours. Urinalysis    Component Value Date/Time   COLORURINE YELLOW 04/25/2015 1451   APPEARANCEUR CLEAR 04/25/2015 1451   LABSPEC >1.030 (H) 04/25/2015 1451   PHURINE 6.0 04/25/2015 1451   GLUCOSEU NEGATIVE 04/25/2015 1451   HGBUR MODERATE (A) 04/25/2015 1451   BILIRUBINUR  NEGATIVE 04/25/2015 1451   KETONESUR NEGATIVE 04/25/2015 1451   PROTEINUR TRACE (A) 04/25/2015 1451   UROBILINOGEN 0.2 04/25/2015 1451   NITRITE NEGATIVE 04/25/2015 1451   LEUKOCYTESUR NEGATIVE 04/25/2015 1451   Sepsis Labs Invalid input(s): PROCALCITONIN,  WBC,  LACTICIDVEN Microbiology Recent Results (from the past 240 hour(s))  Resp Panel by RT-PCR (Flu A&B, Covid) Nasopharyngeal Swab     Status: None   Collection Time: 10/30/20  1:38 AM   Specimen: Nasopharyngeal Swab; Nasopharyngeal(NP) swabs in vial transport medium  Result Value Ref Range Status   SARS Coronavirus 2 by RT PCR NEGATIVE NEGATIVE Final    Comment: (NOTE) SARS-CoV-2 target nucleic acids are NOT DETECTED.  The SARS-CoV-2 RNA is generally detectable in upper respiratory specimens during the acute phase of infection. The lowest concentration of SARS-CoV-2 viral copies this assay can detect is 138 copies/mL. A negative result does not preclude SARS-Cov-2 infection and should not be used as the sole basis for treatment or other patient management decisions. A negative result may occur with  improper specimen collection/handling, submission of specimen other than nasopharyngeal swab, presence of viral mutation(s) within the areas targeted by this assay, and inadequate number of viral copies(<138 copies/mL). A negative result must be combined with clinical observations, patient history, and  epidemiological information. The expected result is Negative.  Fact Sheet for Patients:  BloggerCourse.com  Fact Sheet for Healthcare Providers:  SeriousBroker.it  This test is no t yet approved or cleared by the [place] FDA and  has been authorized for detection and/or diagnosis of SARS-CoV-2 by FDA under an Emergency Use Authorization (EUA). This EUA will remain  in effect (meaning this test can be used) for the duration of the COVID-19 declaration under Section 564(b)(1) of the Act, 21 U.S.C.section 360bbb-3(b)(1), unless the authorization is terminated  or revoked sooner.       Influenza A by PCR NEGATIVE NEGATIVE Final   Influenza B by PCR NEGATIVE NEGATIVE Final    Comment: (NOTE) The Xpert Xpress SARS-CoV-2/FLU/RSV plus assay is intended as an aid in the diagnosis of influenza from Nasopharyngeal swab specimens and should not be used as a sole basis for treatment. Nasal washings and aspirates are unacceptable for Xpert Xpress SARS-CoV-2/FLU/RSV testing.  Fact Sheet for Patients: BloggerCourse.com  Fact Sheet for Healthcare Providers: SeriousBroker.it  This test is not yet approved or cleared by the [place] FDA and has been authorized for detection and/or diagnosis of SARS-CoV-2 by FDA under an Emergency Use Authorization (EUA). This EUA will remain in effect (meaning this test can be used) for the duration of the COVID-19 declaration under Section 564(b)(1) of the Act, 21 U.S.C. section 360bbb-3(b)(1), unless the authorization is terminated or revoked.  Performed at [facility], [address], [place], [place] [address]   MRSA PCR Screening     Status: None   Collection Time: 10/31/20 12:00 PM   Specimen: Nasopharyngeal  Result Value Ref Range Status   MRSA by PCR NEGATIVE NEGATIVE Final    Comment:        The GeneXpert MRSA Assay (FDA approved for  NASAL specimens only), is one component of a comprehensive MRSA colonization surveillance program. It is not intended to diagnose MRSA infection nor to guide or monitor treatment for MRSA infections. Performed at [facility], [address], [place], [place] [address]    Time coordinating discharge: 38 mins   SIGNED:  Standley Dakins, MD  [facility] 11/02/2020, 9:04 AM How to contact the [facility] Attending or  Consulting provider [place] or covering provider during after hours [place], for this patient?  1. Check the care team in [facility] and look for a) attending/consulting [facility] provider listed and b) the [place] team listed 2. Log into [email or web address] and use [place]'s universal password to access. If you do not have the password, please contact the hospital operator. 3. Locate the [facility] provider you are looking for under [facility] and page to a number that you can be directly reached. 4. If you still have difficulty reaching the provider, please page the [facility] (Director on Call) for the Hospitalists listed on amion for assistance.

## 2020-11-02 NOTE — Consult Note (Signed)
NAME:  [name] Carter, MRN:  [id number], DOB:  17-Jan-1961, LOS: 3 ADMISSION DATE:  10/30/2020, CONSULTATION DATE:  11/02/20 REFERRING MD:  Aline August  CHIEF COMPLAINT:  resp distress   History of Present Illness:  60 y.o. male Active smoker with history of chronic back pain, hypertension, COPD, recurrent pneumonia, and more presented the ED with a chief complaint of " I have been sick for a week "  with dyspnea and fevers a week PTA.  He did not get any better.  He reports that he had fevers as high as 103.  The fever seemed to break 3 days PTA-  assoc with cough with thick yellow sputum.  He is coughing so frequently developed gen abd pain and chest pain from the cough.    He reports his dyspnea and cough are worse with exertion.  He does have a wheeze at home.  He has been taking his inhalers and they offered temporary relief but then the symptoms return.  He has as needed oxygen at home and has not tried that.  Patient reported associated generalized weakness and fatigue, shortness of breath.  He did call his PCP who prescribed him Levaquin and prednisone 2 days PTA.  Patient reports the Levaquin usually clears up his pneumonia.  Patient has no other complaints at this time.  Patient is current smoker and request nicotine patch.  He does not drink alcohol and does not use illicit drugs.  He is vaccinated for COVID without booster.  He is full code.     Pertinent  Medical History  Chronic back pain, COPD (chronic obstructive pulmonary disease) ([place]), DDD (degenerative disc disease), cervical, DDD (degenerative disc disease), lumbar, HTN (hypertension), Neck pain, and Pneumonia.  Significant Hospital Events: Including procedures, antibiotic start and stop dates in addition to other pertinent events     . Venous dopplers on R 4/30: Negative for deep venous thrombosis in right lower extremity. [facility] Kitchen Resp viral panel 4/30 neg for flu/ covid 19 . MRSA PCR 5/1  Scheduled Meds: . arformoterol  15  mcg Nebulization BID  . aspirin EC  81 mg Oral Daily  . budesonide (PULMICORT) nebulizer solution  0.5 mg Nebulization BID  . Chlorhexidine Gluconate Cloth  6 each Topical Daily  . enoxaparin (LOVENOX) injection  40 mg Subcutaneous Q24H  . furosemide  30 mg Intravenous Daily  . guaiFENesin  1,200 mg Oral BID  . ipratropium-albuterol  3 mL Nebulization TID  . mouth rinse  15 mL Mouth Rinse BID  . methylPREDNISolone (SOLU-MEDROL) injection  80 mg Intravenous Q6H  . nicotine  21 mg Transdermal Daily  . oxyCODONE  15 mg Oral Q4H  . rosuvastatin  20 mg Oral Daily  . senna-docusate  2 tablet Oral QHS   Continuous Infusions: . sodium chloride 60 mL/hr at 10/30/20 2334  . levofloxacin (LEVAQUIN) IV Stopped (11/01/20 0710)   PRN Meds:.acetaminophen **OR** acetaminophen, albuterol, albuterol, diazepam, morphine injection, ondansetron **OR** ondansetron (ZOFRAN) IV    Interim History / Subjective:  Declined bipap overnight, thinks he's going home today   Objective   Blood pressure (!) 151/86, pulse (!) 51, temperature 97.8 F (36.6 C), temperature source Oral, resp. rate 19, height _0  (1.753 m), weight 69.4 kg, SpO2 93 %.        Intake/Output Summary (Last 24 hours) at 11/02/2020 0542 Last data filed at 11/02/2020 0449 Gross per 24 hour  Intake 910 ml  Output 1200 ml  Net -290 ml   [employer]  10/30/20 0140 11/01/20 0500  Weight: 73 kg 69.4 kg    Examination: Tmax General: chronically ill late middle aged wm mild increased wob at rest with sats 93% on 1lpm  HENT: clear Lungs: pan exp wheeze  Cardiovascular: RRR no s3 or m Abdomen: soft/ benign Extremities: warm s clubbing or edema Neuro: alert, approp, no moto def     I personally reviewed images and agree with radiology impression as follows:  CXR:   10/31/20 portable Coarsened bilateral interstitial opacities are unchanged from most recent comparison radiograph. Could reflect atypical infection or progression of a  chronic interstitial lung disease.  Chronic bronchitic changes and hyperinflation likely reflective of known emphysema and COPD.        Assessment & Plan:  1)  AECOPD / AB in active smoker with recent febrile illness already started on levaquin/ pred prior to d/c with no def as dz on cxr  >>> added empirical gerd rx in this setting   2) Has home 02 with ? 02 dep resp failure/ acute on chronic but 02 needs even in this acute setting are minimal.   3) Chronically sounds like poor control/ freq flares and over reliance on saba  typical of GOLD ?staging/  group D symptoms/ risk for which lama/laba/ICS best choice  So rec either breztri or trelegy > whichever his insurance will pay for and he needs alpha one AT phenotyping > ordered today   4) Active cigarette smoker - Counseled re importance of smoking cessation but did not meet time criteria for separate billing     He has pulmonary f/u thru [facility] in [place] but I'd be happy to see him for second opinion if he continues to founder as an outpt.     Labs   CBC: Recent Labs  Lab 10/30/20 0145 10/31/20 0421  WBC 12.6* 8.9  NEUTROABS 10.3*  --   HGB 13.1 12.7*  HCT 39.9 38.9*  MCV 92.1 94.0  PLT 188 211    Basic Metabolic Panel: Recent Labs  Lab 10/30/20 0145 10/31/20 0421  NA 133* 139  K 4.1 4.6  CL 101 106  CO2 23 27  GLUCOSE 131* 155*  BUN 11 18  CREATININE 0.72 0.71  CALCIUM 8.9 8.8*  MG  --  2.1   GFR: Estimated Creatinine Clearance: 97.6 mL/min (by C-G formula based on SCr of 0.71 mg/dL). Recent Labs  Lab 10/30/20 0145 10/31/20 0421  WBC 12.6* 8.9    Liver Function Tests: Recent Labs  Lab 10/31/20 0421  AST 19  ALT 28  ALKPHOS 148*  BILITOT 0.7  PROT 5.6*  ALBUMIN 2.7*   No results for input(s): LIPASE, AMYLASE in the last 168 hours. No results for input(s): AMMONIA in the last 168 hours.  ABG    Component Value Date/Time   PHART 7.347 (L) 04/27/2015 0835   PCO2ART 39.4 04/27/2015 0835    PO2ART 104 (H) 04/27/2015 0835   HCO3 21.3 04/27/2015 0835   TCO2 22.0 08/20/2014 0820   ACIDBASEDEF 1.6 04/27/2015 0401   O2SAT 97.2 04/27/2015 0835     Coagulation Profile: No results for input(s): INR, PROTIME in the last 168 hours.  Cardiac Enzymes: No results for input(s): CKTOTAL, CKMB, CKMBINDEX, TROPONINI in the last 168 hours.  HbA1C: Hgb A1c MFr Bld  Date/Time Value Ref Range Status  09/16/2017 11:11 PM 5.7 (H) 4.8 - 5.6 % Final    Comment:    (NOTE) Pre diabetes:  5.7%-6.4% Diabetes:              >6.4% Glycemic control for   <7.0% adults with diabetes     CBG: No results for input(s): GLUCAP in the last 168 hours.       Surgical History:   Past Surgical History:  Procedure Laterality Date  . BACK SURGERY     low back x4  . BILATERAL CARPAL TUNNEL RELEASE    . bilteral knee surgery    . COLONOSCOPY  07/2012   [place] MD: MAC. Random colon biopsies benign. Rectal polyp tubular adenoma.  . ESOPHAGOGASTRODUODENOSCOPY  07/2012   [place] MD: MAC. Chronic gastritis and intestinal metaplasia, ?metaplastic atrophic gastritis, negative H.pylori, no celiac, duidnal mucosa with mild Brunner gland hyperplasia, nonspecific.   [facility] Kitchen LEFT HEART CATH AND CORONARY ANGIOGRAPHY N/A 11/07/2017   Procedure: LEFT HEART CATH AND CORONARY ANGIOGRAPHY;  Surgeon: Belva Crome, MD;  Location: [facility];  Service: Cardiovascular;  Laterality: N/A;  . ULNAR NERVE TRANSPOSITION     ? by description, not clear     Social History:   reports that he has been smoking cigarettes. He started smoking about 45 years ago. He has a 41.00 pack-year smoking history. He has never used smokeless tobacco. He reports that he does not drink alcohol and does not use drugs.   Family History:  His family history includes Colon cancer in his father; Heart attack in his father and mother.   Allergies Allergies  Allergen Reactions  . Vicodin [Hydrocodone-Acetaminophen] Nausea And  Vomiting and Other (See Comments)    Made patient feel funny, stomach pain     Home Medications  Prior to Admission medications   Medication Sig Start Date End Date Taking? Authorizing Provider  albuterol (PROVENTIL HFA;VENTOLIN HFA) 108 (90 BASE) MCG/ACT inhaler Inhale 2 puffs into the lungs every 4 (four) hours as needed for wheezing or shortness of breath. 08/21/14  Yes Samuella Cota, MD  albuterol (PROVENTIL) (2.5 MG/3ML) 0.083% nebulizer solution Take 3 mLs (2.5 mg total) by nebulization every 6 (six) hours as needed for wheezing or shortness of breath. 09/18/17  Yes Sinda Du, MD  benzonatate (TESSALON) 200 MG capsule Take 200 mg by mouth 3 (three) times daily as needed for cough. 10/28/20  Yes [provider]  IBU 800 MG tablet Take 800 mg by mouth every 6 (six) hours as needed for fever. 01/02/19  Yes [provider]  levofloxacin (LEVAQUIN) 500 MG tablet Take 500 mg by mouth daily. Started 4/28 x7DS 10/28/20  Yes [provider]  oxyCODONE (ROXICODONE) 15 MG immediate release tablet Take 1 tablet by mouth every 4 (four) hours. 03/13/19  Yes [provider]  predniSONE (STERAPRED UNI-PAK 21 TAB) 10 MG (21) TBPK tablet Take 0-12 tablets by mouth See admin instructions. Tapering down 10/27/20  Yes [provider]       Christinia Gully, MD [facility] [place] [phone]   After 7:00 pm call Elink  [phone]

## 2020-11-04 DIAGNOSIS — M47812 Spondylosis without myelopathy or radiculopathy, cervical region: Secondary | ICD-10-CM | POA: Diagnosis not present

## 2020-11-04 DIAGNOSIS — Z6826 Body mass index (BMI) 26.0-26.9, adult: Secondary | ICD-10-CM | POA: Diagnosis not present

## 2020-11-04 DIAGNOSIS — Z79891 Long term (current) use of opiate analgesic: Secondary | ICD-10-CM | POA: Diagnosis not present

## 2020-11-04 DIAGNOSIS — Z79899 Other long term (current) drug therapy: Secondary | ICD-10-CM | POA: Diagnosis not present

## 2020-11-08 LAB — ALPHA-1 ANTITRYPSIN PHENOTYPE: A-1 Antitrypsin, Ser: 174 mg/dL (ref 101–187)

## 2020-11-09 DIAGNOSIS — J449 Chronic obstructive pulmonary disease, unspecified: Secondary | ICD-10-CM | POA: Diagnosis not present

## 2020-11-26 DIAGNOSIS — M47812 Spondylosis without myelopathy or radiculopathy, cervical region: Secondary | ICD-10-CM | POA: Diagnosis not present

## 2020-11-26 DIAGNOSIS — Z79891 Long term (current) use of opiate analgesic: Secondary | ICD-10-CM | POA: Diagnosis not present

## 2020-11-26 DIAGNOSIS — Z6826 Body mass index (BMI) 26.0-26.9, adult: Secondary | ICD-10-CM | POA: Diagnosis not present

## 2020-11-26 DIAGNOSIS — Z79899 Other long term (current) drug therapy: Secondary | ICD-10-CM | POA: Diagnosis not present

## 2020-11-26 DIAGNOSIS — R030 Elevated blood-pressure reading, without diagnosis of hypertension: Secondary | ICD-10-CM | POA: Diagnosis not present

## 2020-11-28 ENCOUNTER — Emergency Department (HOSPITAL_COMMUNITY): Payer: PPO

## 2020-11-28 ENCOUNTER — Other Ambulatory Visit: Payer: Self-pay

## 2020-11-28 ENCOUNTER — Emergency Department (HOSPITAL_COMMUNITY)
Admission: EM | Admit: 2020-11-28 | Discharge: 2020-11-28 | Disposition: A | Discharge status: Home / Self Care | Payer: PPO | Attending: Emergency Medicine | Admitting: Emergency Medicine

## 2020-11-28 ENCOUNTER — Encounter (HOSPITAL_COMMUNITY): Payer: Self-pay | Admitting: Emergency Medicine

## 2020-11-28 DIAGNOSIS — K6389 Other specified diseases of intestine: Secondary | ICD-10-CM | POA: Diagnosis not present

## 2020-11-28 DIAGNOSIS — F17210 Nicotine dependence, cigarettes, uncomplicated: Secondary | ICD-10-CM | POA: Diagnosis not present

## 2020-11-28 DIAGNOSIS — I2510 Atherosclerotic heart disease of native coronary artery without angina pectoris: Secondary | ICD-10-CM | POA: Insufficient documentation

## 2020-11-28 DIAGNOSIS — J441 Chronic obstructive pulmonary disease with (acute) exacerbation: Secondary | ICD-10-CM | POA: Diagnosis not present

## 2020-11-28 DIAGNOSIS — I10 Essential (primary) hypertension: Secondary | ICD-10-CM | POA: Insufficient documentation

## 2020-11-28 DIAGNOSIS — K8020 Calculus of gallbladder without cholecystitis without obstruction: Secondary | ICD-10-CM | POA: Diagnosis not present

## 2020-11-28 DIAGNOSIS — K3189 Other diseases of stomach and duodenum: Secondary | ICD-10-CM | POA: Diagnosis not present

## 2020-11-28 DIAGNOSIS — N281 Cyst of kidney, acquired: Secondary | ICD-10-CM | POA: Diagnosis not present

## 2020-11-28 DIAGNOSIS — R1031 Right lower quadrant pain: Secondary | ICD-10-CM | POA: Diagnosis not present

## 2020-11-28 LAB — COMPREHENSIVE METABOLIC PANEL WITH GFR
ALT: 24 U/L (ref 0–44)
AST: 21 U/L (ref 15–41)
Albumin: 3.5 g/dL (ref 3.5–5.0)
Alkaline Phosphatase: 64 U/L (ref 38–126)
Anion gap: 5 (ref 5–15)
BUN: 15 mg/dL (ref 6–20)
CO2: 28 mmol/L (ref 22–32)
Calcium: 8.1 mg/dL — ABNORMAL LOW (ref 8.9–10.3)
Chloride: 104 mmol/L (ref 98–111)
Creatinine, Ser: 0.94 mg/dL (ref 0.61–1.24)
GFR, Estimated: >60 mL/min
Glucose, Bld: 95 mg/dL (ref 70–99)
Potassium: 4 mmol/L (ref 3.5–5.1)
Sodium: 137 mmol/L (ref 135–145)
Total Bilirubin: 0.4 mg/dL (ref 0.3–1.2)
Total Protein: 6 g/dL — ABNORMAL LOW (ref 6.5–8.1)

## 2020-11-28 LAB — CBC WITH DIFFERENTIAL/PLATELET
Abs Immature Granulocytes: 0.05 10*3/uL (ref 0.00–0.07)
Basophils Absolute: 0.1 10*3/uL (ref 0.0–0.1)
Basophils Relative: 1 %
Eosinophils Absolute: 0.2 10*3/uL (ref 0.0–0.5)
Eosinophils Relative: 3 %
HCT: 40.2 % (ref 39.0–52.0)
Hemoglobin: 13 g/dL (ref 13.0–17.0)
Immature Granulocytes: 1 %
Lymphocytes Relative: 39 %
Lymphs Abs: 3.6 10*3/uL (ref 0.7–4.0)
MCH: 30.4 pg (ref 26.0–34.0)
MCHC: 32.3 g/dL (ref 30.0–36.0)
MCV: 93.9 fL (ref 80.0–100.0)
Monocytes Absolute: 0.9 10*3/uL (ref 0.1–1.0)
Monocytes Relative: 10 %
Neutro Abs: 4.5 10*3/uL (ref 1.7–7.7)
Neutrophils Relative %: 46 %
Platelets: 286 10*3/uL (ref 150–400)
RBC: 4.28 MIL/uL (ref 4.22–5.81)
RDW: 13.6 % (ref 11.5–15.5)
WBC: 9.4 10*3/uL (ref 4.0–10.5)
nRBC: 0 % (ref 0.0–0.2)

## 2020-11-28 LAB — URINALYSIS, ROUTINE W REFLEX MICROSCOPIC
Bacteria, UA: NONE SEEN
Bilirubin Urine: NEGATIVE
Glucose, UA: NEGATIVE mg/dL
Ketones, ur: NEGATIVE mg/dL
Leukocytes,Ua: NEGATIVE
Nitrite: NEGATIVE
Protein, ur: NEGATIVE mg/dL
Specific Gravity, Urine: 1.02 (ref 1.005–1.030)
pH: 5 (ref 5.0–8.0)

## 2020-11-28 LAB — LIPASE, BLOOD: Lipase: 25 U/L (ref 11–51)

## 2020-11-28 MED ORDER — ONDANSETRON HCL 4 MG/2ML IJ SOLN
4.0000 mg | Freq: Once | INTRAMUSCULAR | Status: AC
  Administered 2020-11-28: 4 mg via INTRAVENOUS
  Filled 2020-11-28: qty 2

## 2020-11-28 MED ORDER — MORPHINE SULFATE (PF) 4 MG/ML IV SOLN
4.0000 mg | Freq: Once | INTRAVENOUS | Status: AC
  Administered 2020-11-28: 4 mg via INTRAVENOUS
  Filled 2020-11-28: qty 1

## 2020-11-28 MED ORDER — CYCLOBENZAPRINE HCL 10 MG PO TABS: 10.0000 mg | ORAL_TABLET | Freq: Two times a day (BID) | ORAL | 0 refills | Status: AC | PRN

## 2020-11-28 MED ORDER — MORPHINE SULFATE (PF) 4 MG/ML IV SOLN
4.0000 mg | Freq: Once | INTRAVENOUS | Status: AC
Start: 2020-11-28 — End: 2020-11-28
  Administered 2020-11-28: 4 mg via INTRAVENOUS
  Filled 2020-11-28: qty 1

## 2020-11-28 NOTE — ED Triage Notes (Signed)
Pt c/o right groin pain that is worse with coughing. States pain stated about 2 wks ago but has continued to increase.

## 2020-11-28 NOTE — Discharge Instructions (Addendum)
Lab work and imaging were unremarkable.  I suspect you are suffering from a muscular strain.  Given a prescription for a muscle relaxer please take as prescribed.  You may take over-the-counter pain medications like ibuprofen or Tylenol every 6 hours as needed.  It was noted you have a benign looking cyst on your left kidney, I would like you to follow-up with urology for further evaluation.  Given contact information above please call  Come back to the emergency department if you develop chest pain, shortness of breath, severe abdominal pain, uncontrolled nausea, vomiting, diarrhea.

## 2020-11-28 NOTE — ED Provider Notes (Signed)
[facility] EMERGENCY DEPARTMENT Provider Note   CSN: [id number] Arrival date & time: 11/28/20  1903     History Chief Complaint  Patient presents with  . Groin Pain    [name] Carter is a 60 y.o. male.  HPI   Patient with significant medical history of chronic back pain, COPD, DDD of the cervical and lumbar spine presents to the emergency department with chief complaint of right lower quadrant pain.  Patient states pain started approximately 2 weeks ago but over last few days pain has gotten worse.  He states pain does not radiate, is worsened when he coughs or moves, he has no associated nausea, vomiting, diarrhea or constipation, he denies urinary symptoms, he denies testicular pain or penile discharge.  He has no significant abdominal history, no history of kidney stones, bowel obstruction, diverticulitis, no history of AAA's.  Patient denies any alleviating factors.  Patient denies headaches, fevers, chills, shortness of breath, worsening pedal edema.  Past Medical History:  Diagnosis Date  . Chronic back pain   . COPD (chronic obstructive pulmonary disease) ([place])   . DDD (degenerative disc disease), cervical   . DDD (degenerative disc disease), lumbar   . HTN (hypertension)   . Neck pain   . Pneumonia     Patient Active Problem List   Diagnosis Date Noted  . CAD in native artery 11/07/2017  . Hypokalemia 09/18/2017  . Hyperglycemia 09/17/2017  . Abnormal EKG 09/17/2017  . 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 GERD (gastroesophageal reflux disease) 05/20/2015  . Community acquired pneumonia 04/25/2015  . Abdominal pain, epigastric 02/12/2015  . LLQ pain 02/12/2015  . Gastritis and gastroduodenitis 02/12/2015  . Constipation 02/12/2015  . COPD exacerbation ([place]) 08/20/2014  . COPD with acute exacerbation ([place]) 06/27/2014  . Chronic pain 06/27/2014  . Acute respiratory failure with hypoxia ([place]) 06/13/2014  . CAP (community acquired pneumonia)  05/16/2014  . Chest pain   . Acute respiratory failure ([place]) 05/13/2014  . Tobacco dependence 05/13/2014    Past Surgical History:  Procedure Laterality Date  . BACK SURGERY     low back x4  . BILATERAL CARPAL TUNNEL RELEASE    . bilteral knee surgery    . COLONOSCOPY  07/2012   [place] MD: MAC. Random colon biopsies benign. Rectal polyp tubular adenoma.  . ESOPHAGOGASTRODUODENOSCOPY  07/2012   [place] MD: MAC. Chronic gastritis and intestinal metaplasia, ?metaplastic atrophic gastritis, negative H.pylori, no celiac, duidnal mucosa with mild Brunner gland hyperplasia, nonspecific.   Marland Kitchen LEFT HEART CATH AND CORONARY ANGIOGRAPHY N/A 11/07/2017   Procedure: LEFT HEART CATH AND CORONARY ANGIOGRAPHY;  Surgeon: Belva Crome, MD;  Location: [place] [facility];  Service: Cardiovascular;  Laterality: N/A;  . ULNAR NERVE TRANSPOSITION     ? by description, not clear       Family History  Problem Relation Age of Onset  . Heart attack Father   . Colon cancer Father        diagnosed in his 81s  . Heart attack Mother     Social History   Tobacco Use  . Smoking status: Heavy Tobacco Smoker    Packs/day: 1.00    Years: 41.00    Pack years: 41.00    Types: Cigarettes    Start date: 74  . Smokeless tobacco: Never Used  . Tobacco comment: one pack daily  Vaping Use  . Vaping Use: Never used  Substance Use Topics  . Alcohol use: No    Alcohol/week: 0.0  standard drinks  . Drug use: No    Home Medications Prior to Admission medications   Medication Sig Start Date End Date Taking? Authorizing Provider  albuterol (PROVENTIL HFA;VENTOLIN HFA) 108 (90 BASE) MCG/ACT inhaler Inhale 2 puffs into the lungs every 4 (four) hours as needed for wheezing or shortness of breath. 08/21/14  Yes Samuella Cota, MD  albuterol (PROVENTIL) (2.5 MG/3ML) 0.083% nebulizer solution Take 3 mLs (2.5 mg total) by nebulization every 6 (six) hours as needed for wheezing or shortness of breath. 09/18/17  Yes  Sinda Du, MD  cyclobenzaprine (FLEXERIL) 10 MG tablet Take 1 tablet (10 mg total) by mouth 2 (two) times daily as needed for muscle spasms. 11/28/20  Yes Marcello Fennel, PA-C  omeprazole (PRILOSEC) 40 MG capsule Take 1 capsule by mouth daily. 11/26/20  Yes [provider]  oxyCODONE (ROXICODONE) 15 MG immediate release tablet Take 1 tablet by mouth every 4 (four) hours. 03/13/19  Yes [provider]  benzonatate (TESSALON) 200 MG capsule Take 200 mg by mouth 3 (three) times daily as needed for cough. Patient not taking: Reported on 11/28/2020 10/28/20   [provider]  Fluticasone-Umeclidin-Vilant (TRELEGY ELLIPTA) 100-62.5-25 MCG/INH AEPB Inhale 1 puff into the lungs daily. Patient not taking: No sig reported 11/02/20   Irwin Brakeman L, MD  levofloxacin (LEVAQUIN) 500 MG tablet Take 500 mg by mouth daily. Started 4/28 x7DS Patient not taking: Reported on 11/28/2020 10/28/20   [provider]  pantoprazole (PROTONIX) 40 MG tablet Take 1 tablet (40 mg total) by mouth daily. Patient not taking: No sig reported 11/02/20 12/02/20  Irwin Brakeman L, MD  predniSONE (DELTASONE) 20 MG tablet Take 3 PO QAM x5days, 2 PO QAM x5days, 1 PO QAM x5days Patient not taking: No sig reported 11/02/20   Murlean Iba, MD    Allergies    Vicodin [hydrocodone-acetaminophen]  Review of Systems   Review of Systems  Constitutional: Negative for chills and fever.  HENT: Negative for congestion.   Respiratory: Negative for shortness of breath.   Cardiovascular: Negative for chest pain.  Gastrointestinal: Positive for abdominal pain. Negative for constipation, nausea and vomiting.  Genitourinary: Negative for enuresis, flank pain, penile discharge and testicular pain.  Musculoskeletal: Negative for back pain.  Skin: Negative for rash.  Neurological: Negative for dizziness.  Hematological: Does not bruise/bleed easily.    Physical Exam Updated Vital Signs BP  120/89   Pulse 71   Temp 98.1 F (36.7 C) (Oral)   Resp 18   Ht _0  (1.753 m)   Wt 74.4 kg   SpO2 93%   BMI 24.22 kg/m   Physical Exam Vitals and nursing note reviewed.  Constitutional:      General: He is not in acute distress.    Appearance: He is not ill-appearing.  HENT:     Head: Normocephalic and atraumatic.     Nose: No congestion.  Eyes:     Conjunctiva/sclera: Conjunctivae normal.  Cardiovascular:     Rate and Rhythm: Normal rate and regular rhythm.     Pulses: Normal pulses.     Heart sounds: No murmur heard. No friction rub. No gallop.   Pulmonary:     Effort: No respiratory distress.     Breath sounds: No wheezing, rhonchi or rales.  Abdominal:     Palpations: Abdomen is soft.     Tenderness: There is abdominal tenderness. There is no right CVA tenderness, left CVA tenderness, guarding or rebound.  Comments: Abdomen was visualized is nondistended, normoactive bowel sounds, dull to percussion.  He was notably tender in his right lower quadrant, negative guarding, rebound tenderness, McBurney point or peritoneal sign.  No CVA tenderness.  Genitourinary:    Penis: Normal.      Testes: Normal.     Comments: Genital exam was performed there is no noted rashes or lesions present, there is no Bell Clapper deformities, testicles were nontender to palpation, patient had no bulging mass with increased abdominal pressure, there is no penile discharge present. Skin:    General: Skin is warm and dry.  Neurological:     Mental Status: He is alert.  Psychiatric:        Mood and Affect: Mood normal.     ED Results / Procedures / Treatments   Labs (all labs ordered are listed, but only abnormal results are displayed) Labs Reviewed  COMPREHENSIVE METABOLIC PANEL - Abnormal; Notable for the following components:      Result Value   Calcium 8.1 (*)    Total Protein 6.0 (*)    All other components within normal limits  URINALYSIS, ROUTINE W REFLEX MICROSCOPIC -  Abnormal; Notable for the following components:   Hgb urine dipstick SMALL (*)    All other components within normal limits  LIPASE, BLOOD  CBC WITH DIFFERENTIAL/PLATELET    EKG None  Radiology CT Abdomen Pelvis Wo Contrast  Result Date: 11/28/2020 CLINICAL DATA:  Right-sided flank pain for 2 weeks increasing today EXAM: CT ABDOMEN AND PELVIS WITHOUT CONTRAST TECHNIQUE: Multidetector CT imaging of the abdomen and pelvis was performed following the standard protocol without IV contrast. COMPARISON:  None. FINDINGS: Lower chest: No acute abnormality. Hepatobiliary: Liver is within normal limits. Dependent gallstones are noted better visualized than on the prior exam. Pancreas: Unremarkable. No pancreatic ductal dilatation or surrounding inflammatory changes. Spleen: Normal in size without focal abnormality. Adrenals/Urinary Tract: Adrenal glands are within normal limits. Kidneys show no evidence of renal calculi. The left kidney is enlarged by a lower pole renal cyst which measures up to 10 cm increased in appearance from the prior exam at which time it measured 8 cm. It remains simple in nature however. No obstructive changes are seen. The bladder is decompressed. Stomach/Bowel: The appendix is within normal limits. Colon shows no obstructive or inflammatory changes. Stomach is distended with ingested food stuffs. Small bowel is unremarkable. Vascular/Lymphatic: Aortic atherosclerosis. No enlarged abdominal or pelvic lymph nodes. Reproductive: Prostate is unremarkable. Other: No abdominal wall hernia or abnormality. No abdominopelvic ascites. Musculoskeletal: No acute or significant osseous findings. Postsurgical changes are noted in the lower lumbar spine. IMPRESSION: Normal-appearing appendix. No renal calculi or obstructive changes. Large left renal cyst which appears simple in nature but is increased in the interval from the prior exam. Cholelithiasis without complicating factors. Electronically  Signed   By: Inez Catalina M.D.   On: 11/28/2020 20:01    Procedures Procedures   Medications Ordered in ED Medications  morphine 4 MG/ML injection 4 mg (4 mg Intravenous Given 11/28/20 1956)  ondansetron (ZOFRAN) injection 4 mg (4 mg Intravenous Given 11/28/20 1955)  morphine 4 MG/ML injection 4 mg (4 mg Intravenous Given 11/28/20 2052)    ED Course  I have reviewed the triage vital signs and the nursing notes.  Pertinent labs & imaging results that were available during my care of the patient were reviewed by me and considered in my medical decision making (see chart for details).    MDM Rules/Calculators/A&P  Initial impression-patient presents with right lower quadrant pain.  He is alert, does not appear to be in acute distress, vital signs reassuring.  Concern for possible appendicitis versus hernia.  Will obtain basic lab work-up, UA, CT scan and reassess.  Work-up-CBC unremarkable, CMP unremarkable, lipase 25, UA unremarkable. CT abdomen pelvis reveals normal-appearing appendix, no renal calculi or obstructive changes, large left renal cyst which appears simple in nature cholelithiasis without complicating factors.  Reassessment-patient was reassessed, he has no complaints this time, vital signs remained stable.  Patient agreeable for discharge at this time.  Rule out-I have low suspicion for UTI, pyelonephritis, kidney stones as there is no signs of infection or hematuria present on UA, CT scan negative for kidney stones.  Low suspicion for incarcerated hernia there is no bulging mass present on exam, CT imaging is negative for this.  Low suspicion for appendicitis as history is atypical patient has been having pain for over 2 weeks time, vital signs reassuring, no leukocytosis present on CBC.  Low suspicion for diverticulitis as there is no diverticulitis seen on CT scan.  Low suspicion for intra-abdominal abscess as patient has had no recent abdominal  surgeries, denies IV drug use, no leukocytosis present.  Plan-  1.  Right lower quadrant pain since resolved-suspect secondary to muscular strain, will provide patient with muscle relaxers, have him follow-up with PCP for further evaluation.  Vital signs have remained stable, no indication for hospital admission. Patient given at home care as well strict return precautions.  Patient verbalized that they understood agreed to said plan.   Final Clinical Impression(s) / ED Diagnoses Final diagnoses:  Right lower quadrant abdominal pain    Rx / DC Orders ED Discharge Orders         Ordered    cyclobenzaprine (FLEXERIL) 10 MG tablet  2 times daily PRN        11/28/20 2145           Marcello Fennel, PA-C 11/28/20 2147    Noemi Chapel, MD 12/03/20 [phone]

## 2020-11-30 ENCOUNTER — Encounter (HOSPITAL_COMMUNITY): Payer: Self-pay

## 2020-11-30 ENCOUNTER — Other Ambulatory Visit (HOSPITAL_COMMUNITY): Payer: Self-pay

## 2020-11-30 DIAGNOSIS — Z122 Encounter for screening for malignant neoplasm of respiratory organs: Secondary | ICD-10-CM

## 2020-11-30 DIAGNOSIS — Z87891 Personal history of nicotine dependence: Secondary | ICD-10-CM

## 2020-11-30 NOTE — Progress Notes (Signed)
LDCT rescheduled to 06/22 at 0800. Unable to reach patient via telephone for appt date and time. Letter being mailed to his home.

## 2020-12-06 ENCOUNTER — Encounter (HOSPITAL_COMMUNITY): Payer: Self-pay

## 2020-12-06 ENCOUNTER — Emergency Department (HOSPITAL_COMMUNITY)
Admission: EM | Admit: 2020-12-06 | Discharge: 2020-12-07 | Disposition: A | Discharge status: Home / Self Care | Payer: PPO | Attending: Emergency Medicine | Admitting: Emergency Medicine

## 2020-12-06 ENCOUNTER — Other Ambulatory Visit: Payer: Self-pay

## 2020-12-06 DIAGNOSIS — R202 Paresthesia of skin: Secondary | ICD-10-CM | POA: Insufficient documentation

## 2020-12-06 DIAGNOSIS — I2510 Atherosclerotic heart disease of native coronary artery without angina pectoris: Secondary | ICD-10-CM | POA: Diagnosis not present

## 2020-12-06 DIAGNOSIS — F17210 Nicotine dependence, cigarettes, uncomplicated: Secondary | ICD-10-CM | POA: Diagnosis not present

## 2020-12-06 DIAGNOSIS — M5412 Radiculopathy, cervical region: Secondary | ICD-10-CM | POA: Diagnosis not present

## 2020-12-06 DIAGNOSIS — J441 Chronic obstructive pulmonary disease with (acute) exacerbation: Secondary | ICD-10-CM | POA: Diagnosis not present

## 2020-12-06 DIAGNOSIS — G8929 Other chronic pain: Secondary | ICD-10-CM | POA: Diagnosis not present

## 2020-12-06 DIAGNOSIS — Z955 Presence of coronary angioplasty implant and graft: Secondary | ICD-10-CM | POA: Diagnosis not present

## 2020-12-06 DIAGNOSIS — Z7951 Long term (current) use of inhaled steroids: Secondary | ICD-10-CM | POA: Insufficient documentation

## 2020-12-06 DIAGNOSIS — I10 Essential (primary) hypertension: Secondary | ICD-10-CM | POA: Diagnosis not present

## 2020-12-06 DIAGNOSIS — M542 Cervicalgia: Secondary | ICD-10-CM | POA: Diagnosis present

## 2020-12-06 NOTE — ED Triage Notes (Signed)
Pt here for chronic neck pain that has been worse over the last few days. Was told several years ago that he would need surgery but could not afford it. States that he pain also goes down his right arm.

## 2020-12-07 MED ORDER — HYDROMORPHONE HCL 2 MG/ML IJ SOLN
2.0000 mg | Freq: Once | INTRAMUSCULAR | Status: AC
  Administered 2020-12-07: 2 mg via INTRAMUSCULAR
  Filled 2020-12-07: qty 1

## 2020-12-07 NOTE — Discharge Instructions (Addendum)
You have neck pain, possibly from a cervical strain and/or pinched nerve.  ° °SEEK IMMEDIATE MEDICAL ATTENTION IF: °You develop difficulties swallowing or breathing.  °You have new or worse numbness, weakness, tingling, or movement problems in your arms or legs.  °You develop increasing pain which is uncontrolled with medications.  °You have change in bowel or bladder function, or other concerns. ° ° ° °

## 2020-12-07 NOTE — ED Provider Notes (Signed)
[facility] EMERGENCY DEPARTMENT Provider Note   CSN: [id number] Arrival date & time: 12/06/20  2147     History Chief Complaint  Patient presents with  . Neck Pain    [name] Carter is a 60 y.o. male.  The history is provided by the patient.  Neck Pain Pain location:  R side Quality:  Aching Pain radiates to:  R arm Pain severity:  Severe Onset quality:  Gradual Timing:  Constant Progression:  Worsening Chronicity:  Chronic Context: not fall   Relieved by:  Nothing Worsened by:  Position Associated symptoms: numbness   Associated symptoms: no bladder incontinence, no bowel incontinence, no chest pain, no fever, no headaches, no paresis and no weakness   Risk factors: no hx of spinal trauma and no recent head injury   Risk factors comment:  Denies cervical spine surgery Patient with history of chronic neck pain, chronic back pain, COPD presents with increasing neck pain.  Patient reports he has had neck pain for up to 3 years.  He has never had surgery.  He reports he has previously had injections in the spine with improvement.  No recent injections.  Patient reports over the past several days he has had increasing pain on the right side of his neck.  No trauma.  Pain and numbness do radiate into the right arm .  No chest pain or shortness of breath.  No headache.  No incontinence.     Past Medical History:  Diagnosis Date  . Chronic back pain   . COPD (chronic obstructive pulmonary disease) ([place])   . DDD (degenerative disc disease), cervical   . DDD (degenerative disc disease), lumbar   . HTN (hypertension)   . Neck pain   . Pneumonia     Patient Active Problem List   Diagnosis Date Noted  . CAD in native artery 11/07/2017  . Hypokalemia 09/18/2017  . Hyperglycemia 09/17/2017  . Abnormal EKG 09/17/2017  . 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 GERD (gastroesophageal reflux disease) 05/20/2015  . Community acquired pneumonia 04/25/2015  .  Abdominal pain, epigastric 02/12/2015  . LLQ pain 02/12/2015  . Gastritis and gastroduodenitis 02/12/2015  . Constipation 02/12/2015  . COPD exacerbation ([place]) 08/20/2014  . COPD with acute exacerbation ([place]) 06/27/2014  . Chronic pain 06/27/2014  . Acute respiratory failure with hypoxia ([place]) 06/13/2014  . CAP (community acquired pneumonia) 05/16/2014  . Chest pain   . Acute respiratory failure ([place]) 05/13/2014  . Tobacco dependence 05/13/2014    Past Surgical History:  Procedure Laterality Date  . BACK SURGERY     low back x4  . BILATERAL CARPAL TUNNEL RELEASE    . bilteral knee surgery    . COLONOSCOPY  07/2012   [place] MD: MAC. Random colon biopsies benign. Rectal polyp tubular adenoma.  . ESOPHAGOGASTRODUODENOSCOPY  07/2012   [place] MD: MAC. Chronic gastritis and intestinal metaplasia, ?metaplastic atrophic gastritis, negative H.pylori, no celiac, duidnal mucosa with mild Brunner gland hyperplasia, nonspecific.   Marland Kitchen LEFT HEART CATH AND CORONARY ANGIOGRAPHY N/A 11/07/2017   Procedure: LEFT HEART CATH AND CORONARY ANGIOGRAPHY;  Surgeon: Belva Crome, MD;  Location: [place] [facility];  Service: Cardiovascular;  Laterality: N/A;  . ULNAR NERVE TRANSPOSITION     ? by description, not clear       Family History  Problem Relation Age of Onset  . Heart attack Father   . Colon cancer Father        diagnosed in his 40s  .  Heart attack Mother     Social History   Tobacco Use  . Smoking status: Heavy Tobacco Smoker    Packs/day: 1.00    Years: 41.00    Pack years: 41.00    Types: Cigarettes    Start date: 88  . Smokeless tobacco: Never Used  . Tobacco comment: one pack daily  Vaping Use  . Vaping Use: Never used  Substance Use Topics  . Alcohol use: No    Alcohol/week: 0.0 standard drinks  . Drug use: No    Home Medications Prior to Admission medications   Medication Sig Start Date End Date Taking? Authorizing Provider  albuterol (PROVENTIL HFA;VENTOLIN  HFA) 108 (90 BASE) MCG/ACT inhaler Inhale 2 puffs into the lungs every 4 (four) hours as needed for wheezing or shortness of breath. 08/21/14   Samuella Cota, MD  albuterol (PROVENTIL) (2.5 MG/3ML) 0.083% nebulizer solution Take 3 mLs (2.5 mg total) by nebulization every 6 (six) hours as needed for wheezing or shortness of breath. 09/18/17   Sinda Du, MD  benzonatate (TESSALON) 200 MG capsule Take 200 mg by mouth 3 (three) times daily as needed for cough. Patient not taking: Reported on 11/28/2020 10/28/20   [provider]  cyclobenzaprine (FLEXERIL) 10 MG tablet Take 1 tablet (10 mg total) by mouth 2 (two) times daily as needed for muscle spasms. 11/28/20   Marcello Fennel, PA-C  Fluticasone-Umeclidin-Vilant (TRELEGY ELLIPTA) 100-62.5-25 MCG/INH AEPB Inhale 1 puff into the lungs daily. Patient not taking: No sig reported 11/02/20   Irwin Brakeman L, MD  levofloxacin (LEVAQUIN) 500 MG tablet Take 500 mg by mouth daily. Started 4/28 x7DS Patient not taking: Reported on 11/28/2020 10/28/20   [provider]  omeprazole (PRILOSEC) 40 MG capsule Take 1 capsule by mouth daily. 11/26/20   [provider]  oxyCODONE (ROXICODONE) 15 MG immediate release tablet Take 1 tablet by mouth every 4 (four) hours. 03/13/19   [provider]  pantoprazole (PROTONIX) 40 MG tablet Take 1 tablet (40 mg total) by mouth daily. Patient not taking: No sig reported 11/02/20 12/02/20  Irwin Brakeman L, MD  predniSONE (DELTASONE) 20 MG tablet Take 3 PO QAM x5days, 2 PO QAM x5days, 1 PO QAM x5days Patient not taking: No sig reported 11/02/20   Murlean Iba, MD    Allergies    Vicodin [hydrocodone-acetaminophen]  Review of Systems   Review of Systems  Constitutional: Negative for fever.  Cardiovascular: Negative for chest pain.  Gastrointestinal: Negative for bowel incontinence.  Genitourinary: Negative for bladder incontinence.  Musculoskeletal: Positive for neck pain.   Neurological: Positive for numbness. Negative for weakness and headaches.  All other systems reviewed and are negative.   Physical Exam Updated Vital Signs BP 122/88 (BP Location: Left Arm)   Pulse 65   Temp 97.9 F (36.6 C) (Oral)   Resp 19   Ht 1.753 m (_0 )   Wt 75.3 kg   SpO2 97%   BMI 24.51 kg/m   Physical Exam CONSTITUTIONAL: Well developed/well nourished HEAD: Normocephalic/atraumatic EYES: EOMI/PERRL, no nystagmus,no ptosis ENMT: Mucous membranes moist NECK: supple no meningeal signs, no bruits Diffuse right cervical paraspinal tenderness. No significant midline tenderness.  No bruising/crepitance/stepoffs noted to spine CV: S1/S2 noted, no murmurs/rubs/gallops noted LUNGS: Lungs are clear to auscultation bilaterally, no apparent distress ABDOMEN: soft NEURO:Awake/alert, face symmetric, no arm or leg drift is noted Equal 5/5 strength with shoulder abduction, elbow flex/extension, wrist flex/extension in upper extremities and equal hand grips bilaterally Sensation  to light touch intact in all extremities EXTREMITIES: pulses normal, full ROM SKIN: warm, color normal PSYCH: Anxious  ED Results / Procedures / Treatments   Labs (all labs ordered are listed, but only abnormal results are displayed) Labs Reviewed - No data to display  EKG None  Radiology No results found.  Procedures Procedures   Medications Ordered in ED Medications  HYDROmorphone (DILAUDID) injection 2 mg (2 mg Intramuscular Given 12/07/20 0042)    ED Course  I have reviewed the triage vital signs and the nursing notes.    MDM Rules/Calculators/A&P                          Patient presents with acute on chronic neck pain.  Denies any trauma.  No gross neurodeficits are noted.  He is afebrile.  He may also have a muscle spasm.  Patient already has narcotics and Flexeril at home.  One-time dose of parenteral narcotics given here in the emergency department, and he reports he is supposed  to have a spinal injection next week.  At this time there is no indication for neuroimaging due to chronicity of his symptoms and no signs of neurologic compromise or myelopathy Final Clinical Impression(s) / ED Diagnoses Final diagnoses:  Cervical radiculopathy    Rx / DC Orders ED Discharge Orders    None       Ripley Fraise, MD 12/07/20 [phone]

## 2020-12-10 DIAGNOSIS — J449 Chronic obstructive pulmonary disease, unspecified: Secondary | ICD-10-CM | POA: Diagnosis not present

## 2020-12-14 ENCOUNTER — Ambulatory Visit: Payer: PPO | Admitting: Cardiology

## 2020-12-20 ENCOUNTER — Emergency Department (HOSPITAL_COMMUNITY): Payer: PPO

## 2020-12-20 ENCOUNTER — Emergency Department (HOSPITAL_COMMUNITY)
Admission: EM | Admit: 2020-12-20 | Discharge: 2020-12-20 | Disposition: A | Discharge status: Home / Self Care | Payer: PPO | Attending: Emergency Medicine | Admitting: Emergency Medicine

## 2020-12-20 ENCOUNTER — Encounter (HOSPITAL_COMMUNITY): Payer: Self-pay | Admitting: Emergency Medicine

## 2020-12-20 ENCOUNTER — Other Ambulatory Visit: Payer: Self-pay

## 2020-12-20 DIAGNOSIS — I2510 Atherosclerotic heart disease of native coronary artery without angina pectoris: Secondary | ICD-10-CM | POA: Diagnosis not present

## 2020-12-20 DIAGNOSIS — M542 Cervicalgia: Secondary | ICD-10-CM | POA: Diagnosis not present

## 2020-12-20 DIAGNOSIS — J441 Chronic obstructive pulmonary disease with (acute) exacerbation: Secondary | ICD-10-CM | POA: Diagnosis not present

## 2020-12-20 DIAGNOSIS — R200 Anesthesia of skin: Secondary | ICD-10-CM | POA: Diagnosis not present

## 2020-12-20 DIAGNOSIS — F17210 Nicotine dependence, cigarettes, uncomplicated: Secondary | ICD-10-CM | POA: Insufficient documentation

## 2020-12-20 DIAGNOSIS — I10 Essential (primary) hypertension: Secondary | ICD-10-CM | POA: Insufficient documentation

## 2020-12-20 DIAGNOSIS — G8929 Other chronic pain: Secondary | ICD-10-CM | POA: Diagnosis not present

## 2020-12-20 DIAGNOSIS — M549 Dorsalgia, unspecified: Secondary | ICD-10-CM | POA: Diagnosis not present

## 2020-12-20 DIAGNOSIS — Z7951 Long term (current) use of inhaled steroids: Secondary | ICD-10-CM | POA: Diagnosis not present

## 2020-12-20 MED ORDER — DEXAMETHASONE SODIUM PHOSPHATE 10 MG/ML IJ SOLN
10.0000 mg | Freq: Once | INTRAMUSCULAR | Status: AC
  Administered 2020-12-20: 10 mg via INTRAVENOUS
  Filled 2020-12-20: qty 1

## 2020-12-20 MED ORDER — HYDROMORPHONE HCL 1 MG/ML IJ SOLN
1.0000 mg | Freq: Once | INTRAMUSCULAR | Status: AC
  Administered 2020-12-20: 1 mg via INTRAVENOUS
  Filled 2020-12-20: qty 1

## 2020-12-20 NOTE — ED Triage Notes (Signed)
Pt has chronic right side neck pain with bulging discs. C/o worsening pain radiating down right arm x 1 month. Pt reports being seen in ED 2 weeks ago for same.

## 2020-12-20 NOTE — Discharge Instructions (Addendum)
The MRI of your spine did not show any new findings.  There is some slight narrowing of some of the areas but no compression on your spinal cord, it is not clear whether your herniated disks or spinal arthritis is causing your symptoms however you will still need to follow-up with the neurosurgeon and spinal surgeon tomorrow.  We have given you a shot of a steroid which should gradually help.  Much of your pain is likely related to the muscles in your neck however please keep your appointment tomorrow morning.

## 2020-12-20 NOTE — ED Notes (Signed)
EDP at bedside performing assessment during triage.

## 2020-12-20 NOTE — ED Provider Notes (Signed)
[facility] Provider Note   CSN: [id number] Arrival date & time: 12/20/20  0754     History Chief Complaint  Patient presents with   Neck Pain    [name] Carter is a 60 y.o. male.   Neck Pain   60 y/o male with hx of chronic back and neck pain - has had low back surgery, and follows with Dr. Saintclair Halsted - has appt at 10:15 in the AM - 12/21/20, this pain is acute on chronic, he has had fairly severe pain over the last couple of days, he was seen at the beginning of June and given some pain medication for which he improved, he states that he has not followed up with his neurosurgeon despite telling the prior provider that he was going to follow-up with him.  He used to get steroid injections at [facility] radiology but has not had one in a couple of years.  He denies any weakness of his right upper extremity but has severe pain in his neck going all the way down his arm and now cannot turn his head to the side without that severe pain going down his arm and causing numbness in his third fourth and fifth fingers of the right hand.  He has no associated symptoms in his leg, no fevers or chills, no nausea or vomiting, no chest pain coughing or shortness of breath.  He has been taking some over-the-counter medications intermittently at home but states they are not helping.  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   GERD (gastroesophageal reflux disease) 05/20/2015   Community acquired pneumonia 04/25/2015   Abdominal pain, epigastric 02/12/2015    LLQ pain 02/12/2015   Gastritis and gastroduodenitis 02/12/2015   Constipation 02/12/2015   COPD exacerbation ([place]) 08/20/2014   COPD with acute exacerbation ([place]) 06/27/2014   Chronic pain 06/27/2014   Acute respiratory failure with hypoxia ([place]) 06/13/2014   CAP (community acquired pneumonia) 05/16/2014   Chest pain    Acute respiratory failure ([place]) 05/13/2014   Tobacco dependence 05/13/2014    Past Surgical History:  Procedure Laterality Date   BACK SURGERY     low back x4   BILATERAL CARPAL TUNNEL RELEASE     bilteral knee surgery     COLONOSCOPY  07/2012   [place] MD: MAC. Random colon biopsies benign. Rectal polyp tubular adenoma.   ESOPHAGOGASTRODUODENOSCOPY  07/2012   [place] MD: MAC. Chronic gastritis and intestinal metaplasia, ?metaplastic atrophic gastritis, negative H.pylori, no celiac, duidnal mucosa with mild Brunner gland hyperplasia, nonspecific.    LEFT HEART CATH AND CORONARY ANGIOGRAPHY N/A 11/07/2017   Procedure: LEFT HEART CATH AND CORONARY ANGIOGRAPHY;  Surgeon: Belva Crome, MD;  Location: [facility];  Service: Cardiovascular;  Laterality: N/A;   ULNAR NERVE TRANSPOSITION     ? by description, not clear       Family History  Problem Relation Age of Onset   Heart attack Father    Colon cancer Father  diagnosed in his 72s   Heart attack Mother     Social History   Tobacco Use   Smoking status: Heavy Smoker    Packs/day: 1.00    Years: 41.00    Pack years: 41.00    Types: Cigarettes    Start date: 1   Smokeless tobacco: Never   Tobacco comments:    one pack daily  Vaping Use   Vaping Use: Never used  Substance Use Topics   Alcohol use: No    Alcohol/week: 0.0 standard drinks   Drug use: No    Home Medications Prior to Admission medications   Medication Sig Start Date End Date Taking? Authorizing Provider  albuterol (PROVENTIL HFA;VENTOLIN HFA) 108 (90 BASE) MCG/ACT inhaler Inhale 2 puffs into the lungs every 4  (four) hours as needed for wheezing or shortness of breath. 08/21/14  Yes Samuella Cota, MD  albuterol (PROVENTIL) (2.5 MG/3ML) 0.083% nebulizer solution Take 3 mLs (2.5 mg total) by nebulization every 6 (six) hours as needed for wheezing or shortness of breath. 09/18/17  Yes Sinda Du, MD  cyclobenzaprine (FLEXERIL) 10 MG tablet Take 1 tablet (10 mg total) by mouth 2 (two) times daily as needed for muscle spasms. 11/28/20  Yes Marcello Fennel, PA-C  oxyCODONE (ROXICODONE) 15 MG immediate release tablet Take 1 tablet by mouth every 4 (four) hours. 03/13/19  Yes [provider]  Fluticasone-Umeclidin-Vilant (TRELEGY ELLIPTA) 100-62.5-25 MCG/INH AEPB Inhale 1 puff into the lungs daily. Patient not taking: No sig reported 11/02/20   Murlean Iba, MD    Allergies    Vicodin [hydrocodone-acetaminophen]  Review of Systems   Review of Systems  Musculoskeletal:  Positive for neck pain.  All other systems reviewed and are negative.  Physical Exam Updated Vital Signs BP (!) 163/106   Pulse 84   Temp 97.7 F (36.5 C)   Resp 20   Ht 1.753 m (_0 )   Wt 74.8 kg   SpO2 97%   BMI 24.37 kg/m   Physical Exam Vitals and nursing note reviewed.  Constitutional:      General: He is not in acute distress.    Appearance: He is well-developed.  HENT:     Head: Normocephalic and atraumatic.     Nose: No congestion or rhinorrhea.     Mouth/Throat:     Pharynx: No oropharyngeal exudate.  Eyes:     General: No scleral icterus.       Right eye: No discharge.        Left eye: No discharge.     Conjunctiva/sclera: Conjunctivae normal.     Pupils: Pupils are equal, round, and reactive to light.  Neck:     Thyroid: No thyromegaly.     Vascular: No JVD.  Cardiovascular:     Rate and Rhythm: Normal rate and regular rhythm.     Heart sounds: Normal heart sounds. No murmur heard.   No friction rub. No gallop.  Pulmonary:     Effort: Pulmonary effort is normal. No respiratory  distress.     Breath sounds: Normal breath sounds. No wheezing or rales.  Abdominal:     General: Bowel sounds are normal. There is no distension.     Palpations: Abdomen is soft. There is no mass.     Tenderness: There is no abdominal tenderness.  Musculoskeletal:        General: Tenderness present. Normal range of motion.     Cervical back: Normal range of motion and neck  supple.     Right lower leg: No edema.     Left lower leg: No edema.     Comments: There is no spinal tenderness of the cervical or thoracic spine but there is significant tenderness in the right paraspinal trapezius and shoulder muscles.  Lymphadenopathy:     Cervical: No cervical adenopathy.  Skin:    General: Skin is warm and dry.     Findings: No erythema or rash.  Neurological:     Mental Status: He is alert.     Coordination: Coordination normal.     Comments: Slight decrease sensation in the third fourth and fifth digits of the right hand, able to make a normal grip with that hand.  Psychiatric:        Behavior: Behavior normal.    ED Results / Procedures / Treatments   Labs (all labs ordered are listed, but only abnormal results are displayed) Labs Reviewed - No data to display  EKG None  Radiology No results found.  IMPRESSION: 1. Compared with the previous study from 2020, no acute findings are identified. There is no cord deformity or abnormal cord signal. 2. There is multilevel spondylosis with disc bulging, uncinate spurring and facet hypertrophy contributing to moderate to severe foraminal narrowing at multiple levels as detailed above. Compared with the prior study, the right foraminal narrowing appears slightly worse at C3-4 and C4-5.  Procedures Procedures   Medications Ordered in ED Medications  HYDROmorphone (DILAUDID) injection 1 mg (1 mg Intravenous Given 12/20/20 0849)  dexamethasone (DECADRON) injection 10 mg (10 mg Intravenous Given 12/20/20 0847)    ED Course  I have  reviewed the triage vital signs and the nursing notes.  Pertinent labs & imaging results that were available during my care of the patient were reviewed by me and considered in my medical decision making (see chart for details).  Clinical Course as of 12/20/20 1124  Mon Dec 20, 2020  0947 I discussed the care with Dr. Saintclair Halsted who is aware that the patient is here and will follow up his MRI results.  The patient has no new weakness, chronic numbness, he has been given steroids and pain medication.  He has a follow-up appointment tomorrow morning at 1015.  MRI read pending [BM]    Clinical Course User Index [BM] Noemi Chapel, MD   MDM Rules/Calculators/A&P                          The patient is having severe pain, he appears very uncomfortable.  He does not have any acute neurologic dysfunction compared to his baseline because he states that he has chronic numbness of his right third fourth and fifth fingers.  He does not have any weakness on exam but due to the severity and progressive worsening of his pain we will obtain a cervical spine MRI and consult with neurosurgery.  The patient is agreeable to the plan, he will be given hydromorphone and Decadron  The patient appears improved, he is feeling less pain and states "I have lots of pain pills at home I do not need a prescription".  At this time he appears stable for discharge, MRI imaging above shows no significant worsening or obvious cause of the patient's pain, this does suggest a musculoskeletal etiology however I do agree with close follow-up tomorrow morning at his appointment  Final Clinical Impression(s) / ED Diagnoses Final diagnoses:  Neck pain    Rx / DC  Orders ED Discharge Orders     None        Noemi Chapel, MD 12/20/20 1124

## 2020-12-20 NOTE — ED Notes (Addendum)
Patient states he has a dentist appointment for his grandchild at 1:00pm and needs to be discharged and does not want wait on the MRI results.

## 2020-12-21 DIAGNOSIS — M542 Cervicalgia: Secondary | ICD-10-CM | POA: Diagnosis not present

## 2020-12-21 DIAGNOSIS — R030 Elevated blood-pressure reading, without diagnosis of hypertension: Secondary | ICD-10-CM | POA: Diagnosis not present

## 2020-12-22 ENCOUNTER — Ambulatory Visit (HOSPITAL_COMMUNITY): Payer: PPO

## 2020-12-24 DIAGNOSIS — Z6826 Body mass index (BMI) 26.0-26.9, adult: Secondary | ICD-10-CM | POA: Diagnosis not present

## 2020-12-24 DIAGNOSIS — Z79899 Other long term (current) drug therapy: Secondary | ICD-10-CM | POA: Diagnosis not present

## 2020-12-24 DIAGNOSIS — Z79891 Long term (current) use of opiate analgesic: Secondary | ICD-10-CM | POA: Diagnosis not present

## 2020-12-24 DIAGNOSIS — R030 Elevated blood-pressure reading, without diagnosis of hypertension: Secondary | ICD-10-CM | POA: Diagnosis not present

## 2020-12-24 DIAGNOSIS — M47812 Spondylosis without myelopathy or radiculopathy, cervical region: Secondary | ICD-10-CM | POA: Diagnosis not present

## 2021-01-10 ENCOUNTER — Ambulatory Visit (HOSPITAL_COMMUNITY): Admission: RE | Admit: 2021-01-10 | Payer: PPO | Source: Ambulatory Visit

## 2021-01-13 ENCOUNTER — Ambulatory Visit: Payer: PPO | Admitting: Cardiology

## 2021-01-13 DIAGNOSIS — R030 Elevated blood-pressure reading, without diagnosis of hypertension: Secondary | ICD-10-CM | POA: Diagnosis not present

## 2021-01-13 DIAGNOSIS — M5412 Radiculopathy, cervical region: Secondary | ICD-10-CM | POA: Diagnosis not present

## 2021-01-18 DIAGNOSIS — Z79891 Long term (current) use of opiate analgesic: Secondary | ICD-10-CM | POA: Diagnosis not present

## 2021-01-18 DIAGNOSIS — R030 Elevated blood-pressure reading, without diagnosis of hypertension: Secondary | ICD-10-CM | POA: Diagnosis not present

## 2021-01-18 DIAGNOSIS — Z6826 Body mass index (BMI) 26.0-26.9, adult: Secondary | ICD-10-CM | POA: Diagnosis not present

## 2021-01-18 DIAGNOSIS — M47812 Spondylosis without myelopathy or radiculopathy, cervical region: Secondary | ICD-10-CM | POA: Diagnosis not present

## 2021-01-18 DIAGNOSIS — Z79899 Other long term (current) drug therapy: Secondary | ICD-10-CM | POA: Diagnosis not present

## 2021-01-24 DIAGNOSIS — J449 Chronic obstructive pulmonary disease, unspecified: Secondary | ICD-10-CM | POA: Diagnosis not present

## 2021-01-24 DIAGNOSIS — E559 Vitamin D deficiency, unspecified: Secondary | ICD-10-CM | POA: Diagnosis not present

## 2021-01-24 DIAGNOSIS — Z131 Encounter for screening for diabetes mellitus: Secondary | ICD-10-CM | POA: Diagnosis not present

## 2021-01-24 DIAGNOSIS — M129 Arthropathy, unspecified: Secondary | ICD-10-CM | POA: Diagnosis not present

## 2021-01-24 DIAGNOSIS — R5383 Other fatigue: Secondary | ICD-10-CM | POA: Diagnosis not present

## 2021-01-24 DIAGNOSIS — F17210 Nicotine dependence, cigarettes, uncomplicated: Secondary | ICD-10-CM | POA: Diagnosis not present

## 2021-01-24 DIAGNOSIS — E7800 Pure hypercholesterolemia, unspecified: Secondary | ICD-10-CM | POA: Diagnosis not present

## 2021-01-24 DIAGNOSIS — Z0000 Encounter for general adult medical examination without abnormal findings: Secondary | ICD-10-CM | POA: Diagnosis not present

## 2021-02-01 ENCOUNTER — Ambulatory Visit (HOSPITAL_COMMUNITY): Payer: PPO

## 2021-02-10 DIAGNOSIS — M5412 Radiculopathy, cervical region: Secondary | ICD-10-CM | POA: Diagnosis not present

## 2021-02-10 NOTE — Progress Notes (Signed)
Surgical Instructions    Your procedure is scheduled on 02/23/21.  Report to [facility] Main Entrance "A" at 05:30 A.M., then check in with the Admitting office.  Call this number if you have problems the morning of surgery:  [phone]   If you have any questions prior to your surgery date call [phone]: Open Monday-Friday 8am-4pm    Remember:  Do not eat after midnight the night before your surgery  You may drink clear liquids until 04:30am the morning of your surgery.   Clear liquids allowed are: Water, Non-Citrus Juices (without pulp), Carbonated Beverages, Clear Tea, Black Coffee Only, and Gatorade    Take these medicines the morning of surgery with A SIP OF WATER  albuterol (PROVENTIL HFA;VENTOLIN HFA) inhaler and nebulizer if needed (Please bring inhalers with you the day of surgery) cyclobenzaprine (FLEXERIL) if needed Fluticasone-Umeclidin-Vilant (TRELEGY ELLIPTA) ondansetron (ZOFRAN-ODT) if needed oxyCODONE (ROXICODONE)     As of today, STOP taking any Aspirin (unless otherwise instructed by your surgeon) Aleve, Naproxen, Ibuprofen, Motrin, Advil, Goody's, BC's, all herbal medications, fish oil, and all vitamins.          Do not wear jewelry or makeup Do not wear lotions, powders, perfumes/colognes, or deodorant. Men may shave face and neck. Do not bring valuables to the hospital.  DO Not wear nail polish, gel polish, artificial nails, or any other type of covering on natural nails  including finger and toenails. If patients have artificial nails, gel coating, etc. that need to be removed by a nail salon please have this removed prior to surgery or surgery may need to be canceled/delayed if the surgeon/ anesthesia feels like the patient is unable to be adequately monitored.             [facility] is not responsible for any belongings or valuables.  Do NOT Smoke (Tobacco/Vaping) or drink Alcohol 24 hours prior to your procedure If you use a CPAP at night, you  may bring all equipment for your overnight stay.   Contacts, glasses, dentures or bridgework may not be worn into surgery, please bring cases for these belongings   For patients admitted to the hospital, discharge time will be determined by your treatment team.   Patients discharged the day of surgery will not be allowed to drive home, and someone needs to stay with them for 24 hours.  ONLY 1 SUPPORT PERSON MAY BE PRESENT WHILE YOU ARE IN SURGERY. IF YOU ARE TO BE ADMITTED ONCE YOU ARE IN YOUR ROOM YOU WILL BE ALLOWED TWO (2) VISITORS.  Minor children may have two parents present. Special consideration for safety and communication needs will be reviewed on a case by case basis.  Special instructions:    Oral Hygiene is also important to reduce your risk of infection.  Remember - BRUSH YOUR TEETH THE MORNING OF SURGERY WITH YOUR REGULAR TOOTHPASTE   [facility]- Preparing For Surgery  Before surgery, you can play an important role. Because skin is not sterile, your skin needs to be as free of germs as possible. You can reduce the number of germs on your skin by washing with CHG (chlorahexidine gluconate) Soap before surgery.  CHG is an antiseptic cleaner which kills germs and bonds with the skin to continue killing germs even after washing.     Please do not use if you have an allergy to CHG or antibacterial soaps. If your skin becomes reddened/irritated stop using the CHG.  Do not shave (including legs and underarms)  for at least 48 hours prior to first CHG shower. It is OK to shave your face.  Please follow these instructions carefully.     Shower the NIGHT BEFORE SURGERY and the MORNING OF SURGERY with CHG Soap.   If you chose to wash your hair, wash your hair first as usual with your normal shampoo. After you shampoo, rinse your hair and body thoroughly to remove the shampoo.  Then [employer] and genitals (private parts) with your normal soap and rinse thoroughly to remove soap.  After  that Use CHG Soap as you would any other liquid soap. You can apply CHG directly to the skin and wash gently with a scrungie or a clean washcloth.   Apply the CHG Soap to your body ONLY FROM THE NECK DOWN.  Do not use on open wounds or open sores. Avoid contact with your eyes, ears, mouth and genitals (private parts). Wash Face and genitals (private parts)  with your normal soap.   Wash thoroughly, paying special attention to the area where your surgery will be performed.  Thoroughly rinse your body with warm water from the neck down.  DO NOT shower/wash with your normal soap after using and rinsing off the CHG Soap.  Pat yourself dry with a CLEAN TOWEL.  Wear CLEAN PAJAMAS to bed the night before surgery  Place CLEAN SHEETS on your bed the night before your surgery  DO NOT SLEEP WITH PETS.   Day of Surgery: Take a shower with CHG soap. Wear Clean/Comfortable clothing the morning of surgery Do not apply any deodorants/lotions.   Remember to brush your teeth WITH YOUR REGULAR TOOTHPASTE.   Please read over the following fact sheets that you were given.

## 2021-02-11 ENCOUNTER — Encounter (HOSPITAL_COMMUNITY): Payer: Self-pay

## 2021-02-11 ENCOUNTER — Other Ambulatory Visit: Payer: Self-pay

## 2021-02-11 ENCOUNTER — Encounter (HOSPITAL_COMMUNITY)
Admission: RE | Admit: 2021-02-11 | Discharge: 2021-02-11 | Disposition: A | Discharge status: Home / Self Care | Payer: PPO | Source: Ambulatory Visit | Attending: Neurosurgery | Admitting: Neurosurgery

## 2021-02-11 DIAGNOSIS — Z8701 Personal history of pneumonia (recurrent): Secondary | ICD-10-CM | POA: Diagnosis not present

## 2021-02-11 DIAGNOSIS — M4800 Spinal stenosis, site unspecified: Secondary | ICD-10-CM | POA: Diagnosis not present

## 2021-02-11 DIAGNOSIS — J441 Chronic obstructive pulmonary disease with (acute) exacerbation: Secondary | ICD-10-CM | POA: Diagnosis not present

## 2021-02-11 DIAGNOSIS — Z01812 Encounter for preprocedural laboratory examination: Secondary | ICD-10-CM | POA: Insufficient documentation

## 2021-02-11 HISTORY — DX: Unspecified asthma, uncomplicated: J45.909

## 2021-02-11 LAB — CBC
HCT: 48.5 % (ref 39.0–52.0)
Hemoglobin: 16 g/dL (ref 13.0–17.0)
MCH: 30.2 pg (ref 26.0–34.0)
MCHC: 33 g/dL (ref 30.0–36.0)
MCV: 91.7 fL (ref 80.0–100.0)
Platelets: 266 10*3/uL (ref 150–400)
RBC: 5.29 MIL/uL (ref 4.22–5.81)
RDW: 12.4 % (ref 11.5–15.5)
WBC: 11.2 10*3/uL — ABNORMAL HIGH (ref 4.0–10.5)
nRBC: 0 % (ref 0.0–0.2)

## 2021-02-11 LAB — BASIC METABOLIC PANEL
Anion gap: 7 (ref 5–15)
BUN: 8 mg/dL (ref 6–20)
CO2: 25 mmol/L (ref 22–32)
Calcium: 9 mg/dL (ref 8.9–10.3)
Chloride: 106 mmol/L (ref 98–111)
Creatinine, Ser: 0.83 mg/dL (ref 0.61–1.24)
GFR, Estimated: >60 mL/min (ref >60)
Glucose, Bld: 85 mg/dL (ref 70–99)
Potassium: 3.6 mmol/L (ref 3.5–5.1)
Sodium: 138 mmol/L (ref 135–145)

## 2021-02-11 LAB — SURGICAL PCR SCREEN
MRSA, PCR: POSITIVE — AB
Staphylococcus aureus: POSITIVE — AB

## 2021-02-11 NOTE — Progress Notes (Signed)
PCP - Salli Real Cardiologist - denies  PPM/ICD - denies   Chest x-ray - n/a EKG - 11/01/20 Stress Test - denies ECHO - 05/16/2019 Cardiac Cath - 11/07/17  Sleep Study - denies   No diabetes  As of today, STOP taking any Aspirin (unless otherwise instructed by your surgeon) Aleve, Naproxen, Ibuprofen, Motrin, Advil, Goody's, BC's, all herbal medications, fish oil, and all vitamins.  ERAS Protcol -yes PRE-SURGERY Ensure or G2- no  COVID TEST- instructed patient to go to [facility] on [facility]/21   Anesthesia review: yes, cardiac history  Patient denies shortness of breath, fever, cough and chest pain at PAT appointment   All instructions explained to the patient, with a verbal understanding of the material. Patient agrees to go over the instructions while at home for a better understanding. Patient also instructed to self quarantine after being tested for COVID-19. The opportunity to ask questions was provided.

## 2021-02-11 NOTE — Progress Notes (Signed)
Called and left a voicemail for [name] Carter, Dr. Dola Argyle assistant about positive MRSA and STAPH result on MRSA swab.

## 2021-02-14 ENCOUNTER — Other Ambulatory Visit: Payer: Self-pay | Admitting: Neurosurgery

## 2021-02-14 NOTE — Progress Notes (Addendum)
Anesthesia Chart Review:  Case: [id number] Date/Time: 02/23/21 0715   Procedure: ACDF - C4-C5 - C5-C6   Anesthesia type: General   Pre-op diagnosis: Stenosis   Location: [facility] ROOM 21 / [facility]   Surgeons: Kary Kos, MD       DISCUSSION: Patient is a 60 year old male scheduled for the above procedure. Neurosurgery notes from 01/13/21 indicate that he has had increased numbness in his arms and difficulty with balance and walking and weakness.   History includes smoking, COPD, asthma, HTN, chronic back pain, spinal surgery (). 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   - ED visit 12/20/20 for neck pain, acute on chronic with pending neurosurgery evaluation. MRI findings as below, and ED provider discussed with Dr. Saintclair Halsted. Treated with hydromorphone and Decadron with 12/21/20 follow-up with Dr. Saintclair Halsted as scheduled.  - ED visit 12/07/20 for neck pain. Given IV narcotics in ED. Already had oral narcotics and Flexeril available at home. - ED visit 11/28/20 for right groin pain. Work-up suggested muscular strain and discharged with muscle relaxant and PCP follow-up. - [facility] admission 10/30/20-11/02/20 for COPD exacerbation. He did not initially respond to out-patient treatment with Levaquin and prednisone. Negative respiratory panel. Chest x-ray showed large lung volumes with mild coarse asymmetric increased pulmonary interstitial opacity since 2020, possibly viral/atypical respiratory infection versus progression of chronic interstitial lung disease. He required Stepdown stay initially due to concern that he would require BiPAP or intubation, but fortunately, he did not require either. He improved with Brovana and Pulmicort. He required 1L O2/[place]. Discharged  home on Levaquin and steroid taper. Nicotine patch ordered.  See in consultation by pulmonologist Christinia Gully, MD. COPD felt likely poorly controlled with over-reliance on SABA. Breztri or Trelegy recommended depending on his coverage. Smoking cessation advised. Continue follow-up with his pulmonologist (appears it was Sinda Du, MD until his retirement last year), unless patient prefers follow-up with Dr. Melvyn Novas.   Last cardiology visit with Dr. Harl Bowie 07/11/19. Chest symptoms most consistent with COPD in setting of recent PNA. Had echo in 2020 and no significant CAD by 10/2017 cath. Patient still requested Nitro to be refilled. Six month follow-up planned. He also offered to refer patient to a new pulmonologist given Dr. Luan Pulling' retirement. 12/14/20 visit rescheduled for 01/13/21, but appears this visit cancelled as well (patient had visit with neurosurgery) and rescheduled for 04/27/21. He denied chest pain, SOB, cough, fever at PAT RN visit.   History of smoking with recurrent PNA requiring admissions, last about 3 1/2 months ago. He did not require BiPAP or CPAP. Med list includes Trelegy now per Dr. Gustavus Bryant recommendation during in-patient consult. Neurosurgery notes indicate he has cut back on the number of cigarettes he is smoking. It appears patient rescheduled his cardiology follow-up until October given his cervical spine issues.  Last office visit > 1 year ago with reassuring LHC three years ago. Echo in 2020 showed normal LVEF, grade 1 diastolic dysfunction. No acute cardiopulmonary symptoms reported at PAT.  I called and spoke with Mr. Revoir. He reports that other than his spinal issues, he feels at his baseline. He has not been having any chest pain and has not had to take Nitro. He has not had any more COPD exacerbations since  his April hospitalization. Although his med lists includes Trelegy and albuterol MDI or nebulizer as needed, he reported that he is not currently taking Trelegy. He  uses albuterol MDI about twice a week which is baseline.  He reported that he is down from 1-2 PPD to smoking 4-5 cigarettes/day. His activity is limited by his chronic spinal issues and more so now with worsening right neck and arm pain and weakness, but say he is able to carry out daily activities and was able to walk from the parking deck to PAT. He has chronic DOE with more moderate activities. He denied SOB at rest, syncope, palpitations, edema. He says that he had a recent physical with Dr. Nancy Fetter at [facility] and discussed upcoming surgery plans--records requested.   Discussed above with anesthesiologist Hoy Morn, MD. Patient without acute cardiopulmonary issues. He feels at baseline. Awaiting most recent PCP records, but if no acute changes then would anticipate that he can proceed as planned. If any new changes between now and surgery, then I've ask patient to discuss with surgeon and appropriate provider.   Preoperative COVID-19 testing is scheduled for 02/21/21. UPDATE 02/18/21 2:18 PM: Surgical clearance letter received from Dr. Nancy Fetter, classifying patient as "Low Risk".   VS: BP (!) 130/98   Pulse 65   Temp 36.4 C   Resp 17   Ht _0  (1.753 m)   Wt 73.7 kg   SpO2 100%   BMI 23.98 kg/m    PROVIDERS: Sandi Mariscal, MD is listed as PCP  Carlyle Dolly, MD is cardiologist   LABS: Labs reviewed: Acceptable for surgery. AST and ALT 21 and 24, respectively 11/28/20.  (all labs ordered are listed, but only abnormal results are displayed)  Labs Reviewed  SURGICAL PCR SCREEN - Abnormal; Notable for the following components:      Result Value   MRSA, PCR POSITIVE (*)    Staphylococcus aureus POSITIVE (*)    All other components within normal limits  CBC - Abnormal; Notable for the following components:   WBC 11.2 (*)    All other components within normal limits  BASIC METABOLIC PANEL     IMAGES: MRI C-spine 12/20/20: IMPRESSION: 1. Compared  with the previous study from 2020, no acute findings are identified. There is no cord deformity or abnormal cord signal. 2. There is multilevel spondylosis with disc bulging, uncinate spurring and facet hypertrophy contributing to moderate to severe foraminal narrowing at multiple levels as detailed above. Compared with the prior study, the right foraminal narrowing appears slightly worse at C3-4 and C4-5.   CT Abd/pelvis 11/28/20: IMPRESSION: - Normal-appearing appendix. - No renal calculi or obstructive changes. - Large left renal cyst which appears simple in nature but is increased in the interval from the prior exam. - Cholelithiasis without complicating factors.  1V PCXR 10/31/20:  IMPRESSION: - Coarsened bilateral interstitial opacities are unchanged from most recent comparison radiograph. Could reflect atypical infection or progression of a chronic interstitial lung disease. - Chronic bronchitic changes and hyperinflation likely reflective of known emphysema and COPD.    EKG: 10/30/20: Sinus rhythm Incomplete RBBB and LAFB Abnormal R-wave progression, early transition Baseline wander in lead(s) V5 No significant change since last tracing Confirmed by Orpah Greek [phone]) on 10/30/2020 1:52:02 AM   CV: RLE Venous US 10/30/20: IMPRESSION: Negative for deep venous thrombosis in right lower extremity.   Echo 05/16/19: IMPRESSIONS   1. Left ventricular ejection fraction, by visual estimation, is 55 to  60%. The left ventricle has normal function. Left ventricular septal wall  thickness was mildly increased. There is no left ventricular hypertrophy.   2. Left ventricular diastolic parameters are consistent with Grade I  diastolic dysfunction (impaired relaxation).   3. Global right ventricle has normal systolic function.The right  ventricular size is normal. No increase in right ventricular wall  thickness.   4. Left atrial size was normal.   5. Right atrial  size was normal.   6. The mitral valve is normal in structure. No evidence of mitral valve  regurgitation. No evidence of mitral stenosis.   7. The tricuspid valve is normal in structure. Tricuspid valve  regurgitation is not demonstrated.   8. The aortic valve is normal in structure. Aortic valve regurgitation is  not visualized. No evidence of aortic valve sclerosis or stenosis.   9. The pulmonic valve was not well visualized. Pulmonic valve  regurgitation is not visualized.  10. The inferior vena cava is normal in size with greater than 50%  respiratory variability, suggesting right atrial pressure of 3 mmHg.    LHC 11/07/17: Non-stenotic 2nd RPLB lesion.  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 Right dominant coronary artery with no evidence of obstructive disease. Normal left main with possibly 20 to 30% proximal narrowing. LAD is widely patent with eccentric 30% narrowing after the first diagonal.  First diagonal contains eccentric 60% narrowing. Angiographically normal circumflex coronary artery giving origin to 3 small obtuse marginal branches The left ventricular cavity is normal in size.  No regional wall motion abnormalities noted.  EF is 60%.   RECOMMENDATIONS:   Clinical correlation with findings. No significant CAD angiographically. Reassess cardiac CT for evidence of cameral fistula as described above.  In absence of heart failure/LV systolic dysfunction and with no evidence of elevated end-diastolic pressure, therapy for the cameral fistula is not felt to be needed.  Should the patient develop LV enlargement, heart failure symptoms, etc, management of the fistula could be with coiling/embolization vs covered stent to occlude or exclude the arterial source originating from a distal RCA LV branch.   Coronary  CTA 10/29/17 with FFR 10/30/17: IMPRESSION: 1. Coronary calcium score of 286. This was 73 percentile for age and sex matched control. 2. Normal coronary origin with right dominance. 3. Diffuse calcified plaque. Moderate but possibly sever stenosis in the proximal to mid LAD, additional analysis with CT FFR will be submitted. FFR:  1. Left Main:  No significant stenosis. 2. LAD: Proximal: 0.93, mid: 0.88, distal: 0.78. 3. D1: 0.88. 4. LCX: Proximal LCX: 0.94, distal LCX: 0.79. 5. RCA: No significant stenosis. IMPRESSION: 1. CT FFR analysis showed significant stenosis in the mid to distal LAD, a cardiac catheterization is recommended.    Past Medical History:  Diagnosis Date   Asthma    Chronic back pain    COPD (chronic obstructive pulmonary disease) (HCC)    DDD (degenerative disc disease), cervical    DDD (degenerative disc disease), lumbar    HTN (hypertension)    Neck pain    Pneumonia     Past Surgical History:  Procedure Laterality Date   BACK SURGERY     low back x4   BILATERAL CARPAL TUNNEL RELEASE     bilteral knee surgery     COLONOSCOPY  07/03/2012   [place] MD: MAC. Random colon biopsies  benign. Rectal polyp tubular adenoma.   ESOPHAGOGASTRODUODENOSCOPY  07/03/2012   [place] MD: MAC. Chronic gastritis and intestinal metaplasia, ?metaplastic atrophic gastritis, negative H.pylori, no celiac, duidnal mucosa with mild Brunner gland hyperplasia, nonspecific.    LEFT HEART CATH AND CORONARY ANGIOGRAPHY N/A 11/07/2017   Procedure: LEFT HEART CATH AND CORONARY ANGIOGRAPHY;  Surgeon: Belva Crome, MD;  Location: [facility];  Service: Cardiovascular;  Laterality: N/A;   TONSILLECTOMY     removed at age 47   [place]     ? by description, not clear    MEDICATIONS:  albuterol (PROVENTIL HFA;VENTOLIN HFA) 108 (90 BASE) MCG/ACT inhaler   albuterol (PROVENTIL) (2.5 MG/3ML) 0.083% nebulizer solution   cyclobenzaprine (FLEXERIL) 10 MG tablet    Fluticasone-Umeclidin-Vilant (TRELEGY ELLIPTA) 100-62.5-25 MCG/INH AEPB   magnesium hydroxide (MILK OF MAGNESIA) 400 MG/5ML suspension   ondansetron (ZOFRAN-ODT) 4 MG disintegrating tablet   oxyCODONE (ROXICODONE) 15 MG immediate release tablet   No current facility-administered medications for this encounter.    Myra Gianotti, PA-C Surgical Short Stay/Anesthesiology [facility] Phone [phone] [facility] Phone [phone] 02/15/2021 5:29 PM

## 2021-02-15 NOTE — Anesthesia Preprocedure Evaluation (Addendum)
Anesthesia Evaluation  Patient identified by MRN, date of birth, ID band Patient awake    Reviewed: Allergy & Precautions, NPO status , Patient's Chart, lab work & pertinent test results  History of Anesthesia Complications Negative for: history of anesthetic complications  Airway Mallampati: I  TM Distance: >3 FB Neck ROM: Full    Dental  (+) Edentulous Upper, Missing, Chipped, Dental Advisory Given   Pulmonary COPD,  COPD inhaler, Current Smoker and Patient abstained from smoking.,  02/21/2021 SARS coronavirus neg   + rhonchi        Cardiovascular hypertension,  Rhythm:Regular Rate:Normal  '20 ECHO: EF 55-60%, normal LVF, grade 1 DD, no significant valvular abnormalities '19 Cath: no significant coronary disease   Neuro/Psych Chronic back pain: opioids    GI/Hepatic GERD  Medicated and Controlled,(+)     substance abuse (for back pain)  ,   Endo/Other  negative endocrine ROS  Renal/GU negative Renal ROS     Musculoskeletal  (+) narcotic dependent  Abdominal   Peds  Hematology negative hematology ROS (+)   Anesthesia Other Findings   Reproductive/Obstetrics                           Anesthesia Physical Anesthesia Plan  ASA: 3  Anesthesia Plan: General   Post-op Pain Management:    Induction: Intravenous  PONV Risk Score and Plan: 1 and Ondansetron and Dexamethasone  Airway Management Planned: Oral ETT and Video Laryngoscope Planned  Additional Equipment: None  Intra-op Plan:   Post-operative Plan: Extubation in OR  Informed Consent: I have reviewed the patients History and Physical, chart, labs and discussed the procedure including the risks, benefits and alternatives for the proposed anesthesia with the patient or authorized representative who has indicated his/her understanding and acceptance.     Dental advisory given  Plan Discussed with: CRNA and  Surgeon  Anesthesia Plan Comments: (PAT note written by Shonna Chock, PA-C. )      Anesthesia Quick Evaluation

## 2021-02-16 DIAGNOSIS — Z79891 Long term (current) use of opiate analgesic: Secondary | ICD-10-CM | POA: Diagnosis not present

## 2021-02-16 DIAGNOSIS — R030 Elevated blood-pressure reading, without diagnosis of hypertension: Secondary | ICD-10-CM | POA: Diagnosis not present

## 2021-02-16 DIAGNOSIS — M47812 Spondylosis without myelopathy or radiculopathy, cervical region: Secondary | ICD-10-CM | POA: Diagnosis not present

## 2021-02-16 DIAGNOSIS — Z79899 Other long term (current) drug therapy: Secondary | ICD-10-CM | POA: Diagnosis not present

## 2021-02-16 DIAGNOSIS — Z6826 Body mass index (BMI) 26.0-26.9, adult: Secondary | ICD-10-CM | POA: Diagnosis not present

## 2021-02-18 DIAGNOSIS — Z79899 Other long term (current) drug therapy: Secondary | ICD-10-CM | POA: Diagnosis not present

## 2021-02-21 ENCOUNTER — Other Ambulatory Visit: Payer: Self-pay | Admitting: Neurosurgery

## 2021-02-22 LAB — SARS CORONAVIRUS 2 (TAT 6-24 HRS): SARS Coronavirus 2: NEGATIVE

## 2021-02-23 ENCOUNTER — Ambulatory Visit (HOSPITAL_COMMUNITY): Payer: PPO | Admitting: Anesthesiology

## 2021-02-23 ENCOUNTER — Other Ambulatory Visit: Payer: Self-pay

## 2021-02-23 ENCOUNTER — Ambulatory Visit (HOSPITAL_COMMUNITY)
Admission: RE | Admit: 2021-02-23 | Discharge: 2021-02-23 | Disposition: A | Discharge status: Home / Self Care | Payer: PPO | Attending: Neurosurgery | Admitting: Neurosurgery

## 2021-02-23 ENCOUNTER — Encounter (HOSPITAL_COMMUNITY)
Admission: RE | Disposition: A | Discharge status: Home / Self Care | Payer: Self-pay | Source: Home / Self Care | Attending: Neurosurgery

## 2021-02-23 ENCOUNTER — Ambulatory Visit (HOSPITAL_COMMUNITY): Payer: PPO

## 2021-02-23 ENCOUNTER — Ambulatory Visit (HOSPITAL_COMMUNITY): Payer: PPO | Admitting: Vascular Surgery

## 2021-02-23 ENCOUNTER — Encounter (HOSPITAL_COMMUNITY): Payer: Self-pay | Admitting: Neurosurgery

## 2021-02-23 DIAGNOSIS — K219 Gastro-esophageal reflux disease without esophagitis: Secondary | ICD-10-CM | POA: Diagnosis not present

## 2021-02-23 DIAGNOSIS — M4722 Other spondylosis with radiculopathy, cervical region: Secondary | ICD-10-CM | POA: Insufficient documentation

## 2021-02-23 DIAGNOSIS — J9601 Acute respiratory failure with hypoxia: Secondary | ICD-10-CM | POA: Diagnosis not present

## 2021-02-23 DIAGNOSIS — M4802 Spinal stenosis, cervical region: Secondary | ICD-10-CM | POA: Insufficient documentation

## 2021-02-23 DIAGNOSIS — Z981 Arthrodesis status: Secondary | ICD-10-CM | POA: Diagnosis not present

## 2021-02-23 DIAGNOSIS — F17210 Nicotine dependence, cigarettes, uncomplicated: Secondary | ICD-10-CM | POA: Insufficient documentation

## 2021-02-23 DIAGNOSIS — Z885 Allergy status to narcotic agent status: Secondary | ICD-10-CM | POA: Diagnosis not present

## 2021-02-23 DIAGNOSIS — Z419 Encounter for procedure for purposes other than remedying health state, unspecified: Secondary | ICD-10-CM

## 2021-02-23 DIAGNOSIS — M4322 Fusion of spine, cervical region: Secondary | ICD-10-CM | POA: Diagnosis not present

## 2021-02-23 DIAGNOSIS — E876 Hypokalemia: Secondary | ICD-10-CM | POA: Diagnosis not present

## 2021-02-23 HISTORY — PX: ANTERIOR CERVICAL DECOMP/DISCECTOMY FUSION: SHX1161

## 2021-02-23 SURGERY — ANTERIOR CERVICAL DECOMPRESSION/DISCECTOMY FUSION 2 LEVELS
Anesthesia: General

## 2021-02-23 MED ORDER — CHLORHEXIDINE GLUCONATE 0.12 % MT SOLN: 15.0000 mL | Freq: Once | OROMUCOSAL | Status: AC

## 2021-02-23 MED ORDER — FLUTICASONE FUROATE-VILANTEROL 100-25 MCG/INH IN AEPB
1.0000 | INHALATION_SPRAY | Freq: Every day | RESPIRATORY_TRACT | Status: DC
  Administered 2021-02-23: 1 via RESPIRATORY_TRACT
  Filled 2021-02-23: qty 28

## 2021-02-23 MED ORDER — HYDROMORPHONE HCL 1 MG/ML IJ SOLN: 0.5000 mg | INTRAMUSCULAR | Status: DC | PRN

## 2021-02-23 MED ORDER — CYCLOBENZAPRINE HCL 10 MG PO TABS: 10.0000 mg | ORAL_TABLET | Freq: Three times a day (TID) | ORAL | Status: DC | PRN

## 2021-02-23 MED ORDER — THROMBIN 5000 UNITS EX SOLR
OROMUCOSAL | Status: DC | PRN
  Administered 2021-02-23: 5 mL via TOPICAL

## 2021-02-23 MED ORDER — PHENYLEPHRINE 40 MCG/ML (10ML) SYRINGE FOR IV PUSH (FOR BLOOD PRESSURE SUPPORT)
PREFILLED_SYRINGE | INTRAVENOUS | Status: AC
  Filled 2021-02-23: qty 10

## 2021-02-23 MED ORDER — DEXAMETHASONE SODIUM PHOSPHATE 10 MG/ML IJ SOLN
INTRAMUSCULAR | Status: DC | PRN
  Administered 2021-02-23: 10 mg via INTRAVENOUS

## 2021-02-23 MED ORDER — MIDAZOLAM HCL 2 MG/2ML IJ SOLN
INTRAMUSCULAR | Status: AC
  Filled 2021-02-23: qty 2

## 2021-02-23 MED ORDER — ONDANSETRON HCL 4 MG PO TABS: 4.0000 mg | ORAL_TABLET | Freq: Four times a day (QID) | ORAL | Status: DC | PRN

## 2021-02-23 MED ORDER — UMECLIDINIUM BROMIDE 62.5 MCG/INH IN AEPB
1.0000 | INHALATION_SPRAY | Freq: Every day | RESPIRATORY_TRACT | Status: DC
  Administered 2021-02-23: 1 via RESPIRATORY_TRACT
  Filled 2021-02-23: qty 7

## 2021-02-23 MED ORDER — PANTOPRAZOLE SODIUM 40 MG IV SOLR: 40.0000 mg | Freq: Every day | INTRAVENOUS | Status: DC

## 2021-02-23 MED ORDER — CEFAZOLIN SODIUM-DEXTROSE 2-4 GM/100ML-% IV SOLN
INTRAVENOUS | Status: AC
  Filled 2021-02-23: qty 100

## 2021-02-23 MED ORDER — MENTHOL 3 MG MT LOZG
1.0000 | LOZENGE | OROMUCOSAL | Status: DC | PRN
  Filled 2021-02-23: qty 9

## 2021-02-23 MED ORDER — CYCLOBENZAPRINE HCL 10 MG PO TABS: 10.0000 mg | ORAL_TABLET | Freq: Two times a day (BID) | ORAL | Status: DC | PRN

## 2021-02-23 MED ORDER — PHENYLEPHRINE 40 MCG/ML (10ML) SYRINGE FOR IV PUSH (FOR BLOOD PRESSURE SUPPORT)
PREFILLED_SYRINGE | INTRAVENOUS | Status: DC | PRN
  Administered 2021-02-23: 120 ug via INTRAVENOUS

## 2021-02-23 MED ORDER — OXYCODONE HCL 5 MG/5ML PO SOLN
5.0000 mg | Freq: Once | ORAL | Status: DC | PRN
Start: 2021-02-23 — End: 2021-02-23

## 2021-02-23 MED ORDER — PROPOFOL 10 MG/ML IV BOLUS
INTRAVENOUS | Status: DC | PRN
  Administered 2021-02-23: 150 mg via INTRAVENOUS

## 2021-02-23 MED ORDER — 0.9 % SODIUM CHLORIDE (POUR BTL) OPTIME
TOPICAL | Status: DC | PRN
  Administered 2021-02-23 (×2): 1000 mL

## 2021-02-23 MED ORDER — PROPOFOL 10 MG/ML IV BOLUS
INTRAVENOUS | Status: AC
  Filled 2021-02-23: qty 40

## 2021-02-23 MED ORDER — ONDANSETRON 4 MG PO TBDP: 4.0000 mg | ORAL_TABLET | Freq: Three times a day (TID) | ORAL | Status: DC | PRN

## 2021-02-23 MED ORDER — CEFAZOLIN SODIUM-DEXTROSE 2-4 GM/100ML-% IV SOLN
2.0000 g | Freq: Three times a day (TID) | INTRAVENOUS | Status: DC
Start: 2021-02-23 — End: 2021-02-24
  Administered 2021-02-23: 2 g via INTRAVENOUS
  Filled 2021-02-23: qty 100

## 2021-02-23 MED ORDER — FLUTICASONE-UMECLIDIN-VILANT 100-62.5-25 MCG/INH IN AEPB: 1.0000 | INHALATION_SPRAY | Freq: Every day | RESPIRATORY_TRACT | Status: DC

## 2021-02-23 MED ORDER — DEXMEDETOMIDINE (PRECEDEX) IN NS 20 MCG/5ML (4 MCG/ML) IV SYRINGE
PREFILLED_SYRINGE | INTRAVENOUS | Status: DC | PRN
  Administered 2021-02-23: 8 ug via INTRAVENOUS
  Administered 2021-02-23: 4 ug via INTRAVENOUS
  Administered 2021-02-23: 8 ug via INTRAVENOUS

## 2021-02-23 MED ORDER — PHENYLEPHRINE HCL-NACL 20-0.9 MG/250ML-% IV SOLN
INTRAVENOUS | Status: DC | PRN
  Administered 2021-02-23: 25 ug/min via INTRAVENOUS

## 2021-02-23 MED ORDER — MIDAZOLAM HCL 5 MG/5ML IJ SOLN
INTRAMUSCULAR | Status: DC | PRN
  Administered 2021-02-23: 2 mg via INTRAVENOUS

## 2021-02-23 MED ORDER — ALUM & MAG HYDROXIDE-SIMETH 200-200-20 MG/5ML PO SUSP: 30.0000 mL | Freq: Four times a day (QID) | ORAL | Status: DC | PRN

## 2021-02-23 MED ORDER — SODIUM CHLORIDE 0.9 % IV SOLN: 250.0000 mL | INTRAVENOUS | Status: DC

## 2021-02-23 MED ORDER — OXYCODONE HCL 5 MG PO TABS: 5.0000 mg | ORAL_TABLET | Freq: Once | ORAL | Status: DC | PRN

## 2021-02-23 MED ORDER — SUCCINYLCHOLINE CHLORIDE 200 MG/10ML IV SOSY
PREFILLED_SYRINGE | INTRAVENOUS | Status: AC
  Filled 2021-02-23: qty 10

## 2021-02-23 MED ORDER — ACETAMINOPHEN 500 MG PO TABS: 1000.0000 mg | ORAL_TABLET | Freq: Once | ORAL | Status: AC

## 2021-02-23 MED ORDER — FENTANYL CITRATE (PF) 250 MCG/5ML IJ SOLN
INTRAMUSCULAR | Status: AC
  Filled 2021-02-23: qty 5

## 2021-02-23 MED ORDER — ORAL CARE MOUTH RINSE: 15.0000 mL | Freq: Once | OROMUCOSAL | Status: AC

## 2021-02-23 MED ORDER — VANCOMYCIN HCL IN DEXTROSE 1-5 GM/200ML-% IV SOLN
INTRAVENOUS | Status: AC
  Filled 2021-02-23: qty 200

## 2021-02-23 MED ORDER — ROCURONIUM BROMIDE 10 MG/ML (PF) SYRINGE
PREFILLED_SYRINGE | INTRAVENOUS | Status: DC | PRN
  Administered 2021-02-23: 20 mg via INTRAVENOUS
  Administered 2021-02-23: 70 mg via INTRAVENOUS

## 2021-02-23 MED ORDER — SODIUM CHLORIDE 0.9% FLUSH: 3.0000 mL | INTRAVENOUS | Status: DC | PRN

## 2021-02-23 MED ORDER — THROMBIN 5000 UNITS EX SOLR
CUTANEOUS | Status: AC
  Filled 2021-02-23: qty 15000

## 2021-02-23 MED ORDER — ONDANSETRON HCL 4 MG/2ML IJ SOLN
INTRAMUSCULAR | Status: DC | PRN
  Administered 2021-02-23: 4 mg via INTRAVENOUS

## 2021-02-23 MED ORDER — ALBUTEROL SULFATE HFA 108 (90 BASE) MCG/ACT IN AERS: 2.0000 | INHALATION_SPRAY | RESPIRATORY_TRACT | Status: DC | PRN

## 2021-02-23 MED ORDER — ROCURONIUM BROMIDE 10 MG/ML (PF) SYRINGE
PREFILLED_SYRINGE | INTRAVENOUS | Status: AC
  Filled 2021-02-23: qty 10

## 2021-02-23 MED ORDER — PROMETHAZINE HCL 25 MG/ML IJ SOLN: 6.2500 mg | INTRAMUSCULAR | Status: DC | PRN

## 2021-02-23 MED ORDER — LACTATED RINGERS IV SOLN: INTRAVENOUS | Status: DC

## 2021-02-23 MED ORDER — ACETAMINOPHEN 650 MG RE SUPP: 650.0000 mg | RECTAL | Status: DC | PRN

## 2021-02-23 MED ORDER — HYDROMORPHONE HCL 1 MG/ML IJ SOLN: 0.2500 mg | INTRAMUSCULAR | Status: DC | PRN

## 2021-02-23 MED ORDER — ONDANSETRON HCL 4 MG/2ML IJ SOLN: 4.0000 mg | Freq: Four times a day (QID) | INTRAMUSCULAR | Status: DC | PRN

## 2021-02-23 MED ORDER — CEFAZOLIN SODIUM-DEXTROSE 2-3 GM-%(50ML) IV SOLR
INTRAVENOUS | Status: DC | PRN
  Administered 2021-02-23: 2 g via INTRAVENOUS

## 2021-02-23 MED ORDER — LIDOCAINE 2% (20 MG/ML) 5 ML SYRINGE
INTRAMUSCULAR | Status: DC | PRN
  Administered 2021-02-23: 20 mg via INTRAVENOUS

## 2021-02-23 MED ORDER — FENTANYL CITRATE (PF) 250 MCG/5ML IJ SOLN
INTRAMUSCULAR | Status: DC | PRN
  Administered 2021-02-23: 250 ug via INTRAVENOUS

## 2021-02-23 MED ORDER — ALBUTEROL SULFATE (2.5 MG/3ML) 0.083% IN NEBU: 2.5000 mg | INHALATION_SOLUTION | Freq: Four times a day (QID) | RESPIRATORY_TRACT | Status: DC | PRN

## 2021-02-23 MED ORDER — OXYCODONE HCL 5 MG PO TABS
15.0000 mg | ORAL_TABLET | ORAL | Status: DC
Start: 2021-02-23 — End: 2021-02-24
  Administered 2021-02-23 (×2): 15 mg via ORAL
  Filled 2021-02-23 (×2): qty 3

## 2021-02-23 MED ORDER — DEXAMETHASONE SODIUM PHOSPHATE 10 MG/ML IJ SOLN
INTRAMUSCULAR | Status: AC
  Filled 2021-02-23: qty 1

## 2021-02-23 MED ORDER — ACETAMINOPHEN 325 MG PO TABS: 650.0000 mg | ORAL_TABLET | ORAL | Status: DC | PRN

## 2021-02-23 MED ORDER — SODIUM CHLORIDE 0.9% FLUSH: 3.0000 mL | Freq: Two times a day (BID) | INTRAVENOUS | Status: DC

## 2021-02-23 MED ORDER — PHENOL 1.4 % MT LIQD
1.0000 | OROMUCOSAL | Status: DC | PRN
  Administered 2021-02-23: 1 via OROMUCOSAL
  Filled 2021-02-23: qty 177

## 2021-02-23 MED ORDER — MAGNESIUM HYDROXIDE 400 MG/5ML PO SUSP: 15.0000 mL | Freq: Every day | ORAL | Status: DC | PRN

## 2021-02-23 MED ORDER — OXYCODONE HCL 5 MG PO TABS: 10.0000 mg | ORAL_TABLET | ORAL | Status: DC | PRN

## 2021-02-23 MED ORDER — SUGAMMADEX SODIUM 200 MG/2ML IV SOLN
INTRAVENOUS | Status: DC | PRN
  Administered 2021-02-23: 200 mg via INTRAVENOUS

## 2021-02-23 MED ORDER — ACETAMINOPHEN 500 MG PO TABS
ORAL_TABLET | ORAL | Status: AC
  Administered 2021-02-23: 1000 mg via ORAL
  Filled 2021-02-23: qty 2

## 2021-02-23 MED ORDER — HEMOSTATIC AGENTS (NO CHARGE) OPTIME
TOPICAL | Status: DC | PRN
  Administered 2021-02-23: 1 via TOPICAL

## 2021-02-23 MED ORDER — CHLORHEXIDINE GLUCONATE 0.12 % MT SOLN
OROMUCOSAL | Status: AC
  Administered 2021-02-23: 15 mL via OROMUCOSAL
  Filled 2021-02-23: qty 15

## 2021-02-23 MED ORDER — MEPERIDINE HCL 25 MG/ML IJ SOLN: 6.2500 mg | INTRAMUSCULAR | Status: DC | PRN

## 2021-02-23 MED ORDER — VANCOMYCIN HCL IN DEXTROSE 1-5 GM/200ML-% IV SOLN
1000.0000 mg | Freq: Once | INTRAVENOUS | Status: AC
  Administered 2021-02-23: 1000 mg via INTRAVENOUS

## 2021-02-23 MED ORDER — LIDOCAINE 2% (20 MG/ML) 5 ML SYRINGE
INTRAMUSCULAR | Status: AC
  Filled 2021-02-23: qty 5

## 2021-02-23 MED ORDER — THROMBIN 5000 UNITS EX SOLR
CUTANEOUS | Status: DC | PRN
  Administered 2021-02-23 (×2): 5000 [IU] via TOPICAL

## 2021-02-23 MED ORDER — ONDANSETRON HCL 4 MG/2ML IJ SOLN
INTRAMUSCULAR | Status: AC
  Filled 2021-02-23: qty 2

## 2021-02-23 MED ORDER — ALBUTEROL SULFATE HFA 108 (90 BASE) MCG/ACT IN AERS
INHALATION_SPRAY | RESPIRATORY_TRACT | Status: DC | PRN
  Administered 2021-02-23: 2 via RESPIRATORY_TRACT

## 2021-02-23 MED ORDER — MIDAZOLAM HCL 2 MG/2ML IJ SOLN: 0.5000 mg | Freq: Once | INTRAMUSCULAR | Status: DC | PRN

## 2021-02-23 SURGICAL SUPPLY — 57 items
BAG COUNTER SPONGE SURGICOUNT (BAG) ×4 IMPLANT
BAND RUBBER #18 3X1/16 STRL (MISCELLANEOUS) ×4 IMPLANT
BASKET BONE COLLECTION (BASKET) ×2 IMPLANT
BENZOIN TINCTURE PRP APPL 2/3 (GAUZE/BANDAGES/DRESSINGS) ×2 IMPLANT
BIT DRILL NEURO 2X3.1 SFT TUCH (MISCELLANEOUS) ×1 IMPLANT
BONE VIVIGEN FORMABLE 1.3CC (Bone Implant) ×4 IMPLANT
BUR MATCHSTICK NEURO 3.0 LAGG (BURR) ×2 IMPLANT
CANISTER SUCT 3000ML PPV (MISCELLANEOUS) ×2 IMPLANT
CARTRIDGE OIL MAESTRO DRILL (MISCELLANEOUS) ×1 IMPLANT
DERMABOND ADVANCED (GAUZE/BANDAGES/DRESSINGS) ×1
DERMABOND ADVANCED .7 DNX12 (GAUZE/BANDAGES/DRESSINGS) ×1 IMPLANT
DIFFUSER DRILL AIR PNEUMATIC (MISCELLANEOUS) ×2 IMPLANT
DRAPE C-ARM 42X72 X-RAY (DRAPES) ×4 IMPLANT
DRAPE LAPAROTOMY 100X72 PEDS (DRAPES) ×2 IMPLANT
DRAPE MICROSCOPE LEICA (MISCELLANEOUS) ×2 IMPLANT
DRILL NEURO 2X3.1 SOFT TOUCH (MISCELLANEOUS) ×2
DRSG OPSITE POSTOP 4X6 (GAUZE/BANDAGES/DRESSINGS) ×2 IMPLANT
DURAPREP 6ML APPLICATOR 50/CS (WOUND CARE) ×2 IMPLANT
ELECT COATED BLADE 2.86 ST (ELECTRODE) ×2 IMPLANT
ELECT REM PT RETURN 9FT ADLT (ELECTROSURGICAL) ×2
ELECTRODE REM PT RTRN 9FT ADLT (ELECTROSURGICAL) ×1 IMPLANT
GAUZE 4X4 16PLY ~~LOC~~+RFID DBL (SPONGE) ×2 IMPLANT
GAUZE SPONGE 4X4 12PLY STRL (GAUZE/BANDAGES/DRESSINGS) ×2 IMPLANT
GLOVE EXAM NITRILE XL STR (GLOVE) IMPLANT
GLOVE SURG ENC MOIS LTX SZ7 (GLOVE) IMPLANT
GLOVE SURG ENC MOIS LTX SZ8 (GLOVE) ×2 IMPLANT
GLOVE SURG UNDER LTX SZ8.5 (GLOVE) ×2 IMPLANT
GLOVE SURG UNDER POLY LF SZ7 (GLOVE) ×2 IMPLANT
GOWN STRL REUS W/ TWL LRG LVL3 (GOWN DISPOSABLE) ×2 IMPLANT
GOWN STRL REUS W/ TWL XL LVL3 (GOWN DISPOSABLE) ×1 IMPLANT
GOWN STRL REUS W/TWL 2XL LVL3 (GOWN DISPOSABLE) IMPLANT
GOWN STRL REUS W/TWL LRG LVL3 (GOWN DISPOSABLE) ×2
GOWN STRL REUS W/TWL XL LVL3 (GOWN DISPOSABLE) ×1
HALTER HD/CHIN CERV TRACTION D (MISCELLANEOUS) ×2 IMPLANT
HEMOSTAT POWDER KIT SURGIFOAM (HEMOSTASIS) ×2 IMPLANT
KIT BASIN OR (CUSTOM PROCEDURE TRAY) ×2 IMPLANT
KIT TURNOVER KIT B (KITS) ×2 IMPLANT
NEEDLE SPNL 20GX3.5 QUINCKE YW (NEEDLE) ×2 IMPLANT
NS IRRIG 1000ML POUR BTL (IV SOLUTION) ×4 IMPLANT
OIL CARTRIDGE MAESTRO DRILL (MISCELLANEOUS) ×2
PACK LAMINECTOMY NEURO (CUSTOM PROCEDURE TRAY) ×2 IMPLANT
PAD ARMBOARD 7.5X6 YLW CONV (MISCELLANEOUS) ×6 IMPLANT
PIN DISTRACTION 14MM (PIN) ×4 IMPLANT
PLATE ANT CERV XTEND 2 LV 32 (Plate) ×2 IMPLANT
SCREW VAR 4.2 XD SELF DRILL 14 (Screw) ×12 IMPLANT
SPACER HEDRON 14X16X6 0D (Spacer) ×2 IMPLANT
SPACER HEDRON 14X16X7 0D (Spacer) ×2 IMPLANT
SPONGE INTESTINAL PEANUT (DISPOSABLE) ×2 IMPLANT
SPONGE SURGIFOAM ABS GEL SZ50 (HEMOSTASIS) ×2 IMPLANT
SPONGE T-LAP 4X18 ~~LOC~~+RFID (SPONGE) ×2 IMPLANT
STRIP CLOSURE SKIN 1/2X4 (GAUZE/BANDAGES/DRESSINGS) ×2 IMPLANT
SUT VIC AB 3-0 SH 8-18 (SUTURE) ×2 IMPLANT
SUT VICRYL 4-0 PS2 18IN ABS (SUTURE) ×2 IMPLANT
TAPE CLOTH 4X10 WHT NS (GAUZE/BANDAGES/DRESSINGS) IMPLANT
TOWEL GREEN STERILE (TOWEL DISPOSABLE) ×2 IMPLANT
TOWEL GREEN STERILE FF (TOWEL DISPOSABLE) ×2 IMPLANT
WATER STERILE IRR 1000ML POUR (IV SOLUTION) ×2 IMPLANT

## 2021-02-23 NOTE — Discharge Summary (Signed)
Physician Discharge Summary  Patient ID: [name] Carter MRN: [id number] DOB/AGE: 60-05-1961 60 y.o.  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 The patient tolerated the procedure well and was taken to the recovery room and then to the floor in stable condition. The hospital course was routine. There were no complications. The wound remained clean dry and intact. Pt had appropriate neck soreness. No complaints of arm pain or new N/T/W. The patient remained afebrile with stable vital signs, and tolerated a regular diet. The patient continued to increase activities, and pain was well controlled with oral pain medications.   Consults: None  Significant Diagnostic Studies:  Results for orders placed or performed in visit on 02/21/21  SARS Coronavirus 2 (TAT 6-24 hrs)  Result Value Ref Range   SARS Coronavirus 2 RESULT: NEGATIVE     DG Cervical Spine 2 or 3 views  Result Date: 02/23/2021 CLINICAL DATA:  Status post anterior cervical fusion of C4-5 and C5-6. EXAM: CERVICAL SPINE - 2-3 VIEW; DG C-ARM 1-60 MIN-NO REPORT Radiation exposure index: 0.68 mGy. COMPARISON:  None. FINDINGS: Single intraoperative fluoroscopic image was obtained of the cervical spine. Patient is status post surgical anterior fusion of C4-5 and C5-6. IMPRESSION: Fluoroscopic guidance provided during cervical surgery. Electronically Signed   By: Lupita Raider M.D.   On: 02/23/2021 12:15   DG C-Arm 1-60 Min-No Report  Result Date: 02/23/2021 CLINICAL DATA:  Status post anterior cervical fusion of C4-5 and C5-6. EXAM: CERVICAL SPINE - 2-3 VIEW; DG C-ARM 1-60 MIN-NO REPORT Radiation exposure index: 0.68 mGy. COMPARISON:  None.  FINDINGS: Single intraoperative fluoroscopic image was obtained of the cervical spine. Patient is status post surgical anterior fusion of C4-5 and C5-6. IMPRESSION: Fluoroscopic guidance provided during cervical surgery. Electronically Signed   By: Lupita Raider M.D.   On: 02/23/2021 12:15    Antibiotics:  Anti-infectives (From admission, onward)    Start     Dose/Rate Route Frequency Ordered Stop   02/23/21 1600  ceFAZolin (ANCEF) IVPB 2g/100 mL premix        2 g 200 mL/hr over 30 Minutes Intravenous Every 8 hours 02/23/21 1210 02/24/21 0759   02/23/21 0745  vancomycin (VANCOCIN) IVPB 1000 mg/200 mL premix       Note to Pharmacy: Patient's surgical PCR + for MRSA/MSSA; need vancomycin in addition to ancef   1,000 mg 200 mL/hr over 60 Minutes Intravenous  Once 02/23/21 0737 02/23/21 0900   02/23/21 0734  ceFAZolin (ANCEF) 2-4 GM/100ML-% IVPB       Note to Pharmacy: Clovis Cao   : cabinet override      02/23/21 0734 02/23/21 1944   02/23/21 0734  vancomycin (VANCOCIN) 1-5 GM/200ML-% IVPB       Note to Pharmacy: Clovis Cao   : cabinet override      02/23/21 0734 02/23/21 0814       Discharge Exam: Blood pressure 123/79, pulse 86, temperature 98.4 F (36.9 C), temperature source Oral, resp. rate 18, height 5\' 9"  (1.753 m), weight 74.8 kg, SpO2 95 %. Neurologic: Grossly normal Ambulating and voiding well   Discharge Medications:   Allergies as of 02/23/2021       Reactions   Vicodin [hydrocodone-acetaminophen] Nausea And Vomiting,  Other (See Comments)   Made patient feel funny, stomach pain        Medication List     TAKE these medications    albuterol 108 (90 Base) MCG/ACT inhaler Commonly known as: VENTOLIN HFA Inhale 2 puffs into the lungs every 4 (four) hours as needed for wheezing or shortness of breath.   albuterol (2.5 MG/3ML) 0.083% nebulizer solution Commonly known as: PROVENTIL Take 3 mLs (2.5 mg total) by nebulization every 6 (six) hours as needed  for wheezing or shortness of breath.   cyclobenzaprine 10 MG tablet Commonly known as: FLEXERIL Take 1 tablet (10 mg total) by mouth 2 (two) times daily as needed for muscle spasms.   magnesium hydroxide 400 MG/5ML suspension Commonly known as: MILK OF MAGNESIA Take 15 mLs by mouth daily as needed for mild constipation.   ondansetron 4 MG disintegrating tablet Commonly known as: ZOFRAN-ODT Take 4 mg by mouth 2 (two) times daily as needed for nausea/vomiting.   oxyCODONE 15 MG immediate release tablet Commonly known as: ROXICODONE Take 15 mg by mouth every 4 (four) hours.   Trelegy Ellipta 100-62.5-25 MCG/INH Aepb Generic drug: Fluticasone-Umeclidin-Vilant Inhale 1 puff into the lungs daily.        Disposition: home   Final Dx: acdf C4-5, 5-6  Discharge Instructions      Remove dressing in 72 hours   Complete by: As directed    Call MD for:  difficulty breathing, headache or visual disturbances   Complete by: As directed    Call MD for:  hives   Complete by: As directed    Call MD for:  persistant dizziness or light-headedness   Complete by: As directed    Call MD for:  persistant nausea and vomiting   Complete by: As directed    Call MD for:  redness, tenderness, or signs of infection (pain, swelling, redness, odor or green/yellow discharge around incision site)   Complete by: As directed    Call MD for:  severe uncontrolled pain   Complete by: As directed    Diet - low sodium heart healthy   Complete by: As directed    Driving Restrictions   Complete by: As directed    No driving for 2 weeks, no riding in the car for 1 week   Increase activity slowly   Complete by: As directed    Lifting restrictions   Complete by: As directed    No lifting more than 8 lbs          Signed: Tiana Loft Sharyl Panchal 02/23/2021, 6:24 PM

## 2021-02-23 NOTE — Progress Notes (Signed)
Pt doing well. Pt given D/C instructions with verbal understanding. Incision is clean and dry with no sign of infection. Pt's IV was removed prior to D/C. Pt D/C'd home via walking per MD order. Pt is stable @ D/C and has no other needs at this time. Rohit Deloria, RN  

## 2021-02-23 NOTE — Discharge Instructions (Signed)

## 2021-02-23 NOTE — H&P (Signed)
[name] Carter is an 60 y.o. male.   Chief Complaint: Neck and right arm pain HPI: 60 year old gentleman with neck and right arm pain with weakness in his right arm.  Work-up revealed severe cervical spondylosis and stenosis with spinal cord compression and foraminal stenosis at C4-5 and C5-6.  I extensively reviewed the risks and benefits of a recommended ACDF at C4-5 and C5-6 as well as perioperative course expectations of outcome and alternatives of surgery and he understands and agrees to proceed forward.  Past Medical History:  Diagnosis Date   Asthma    Chronic back pain    COPD (chronic obstructive pulmonary disease) (HCC)    DDD (degenerative disc disease), cervical    DDD (degenerative disc disease), lumbar    HTN (hypertension)    Neck pain    Pneumonia     Past Surgical History:  Procedure Laterality Date   BACK SURGERY     low back x4   BILATERAL CARPAL TUNNEL RELEASE     bilteral knee surgery     COLONOSCOPY  07/03/2012   [place] MD: MAC. Random colon biopsies benign. Rectal polyp tubular adenoma.   ESOPHAGOGASTRODUODENOSCOPY  07/03/2012   [place] MD: MAC. Chronic gastritis and intestinal metaplasia, ?metaplastic atrophic gastritis, negative H.pylori, no celiac, duidnal mucosa with mild Brunner gland hyperplasia, nonspecific.    LEFT HEART CATH AND CORONARY ANGIOGRAPHY N/A 11/07/2017   Procedure: LEFT HEART CATH AND CORONARY ANGIOGRAPHY;  Surgeon: Belva Crome, MD;  Location: [facility];  Service: Cardiovascular;  Laterality: N/A;   TONSILLECTOMY     removed at age 50   [place]     ? by description, not clear    Family History  Problem Relation Age of Onset   Heart attack Father    Colon cancer Father        diagnosed in his 31s   Heart attack Mother    Social History:  reports that he has been smoking cigarettes. He started smoking about 45 years ago. He has a 41.00 pack-year smoking history. He has never used smokeless tobacco. He  reports that he does not drink alcohol and does not use drugs.  Allergies:  Allergies  Allergen Reactions   Vicodin [Hydrocodone-Acetaminophen] Nausea And Vomiting and Other (See Comments)    Made patient feel funny, stomach pain    Medications Prior to Admission  Medication Sig Dispense Refill   albuterol (PROVENTIL HFA;VENTOLIN HFA) 108 (90 BASE) MCG/ACT inhaler Inhale 2 puffs into the lungs every 4 (four) hours as needed for wheezing or shortness of breath. 1 Inhaler 1   albuterol (PROVENTIL) (2.5 MG/3ML) 0.083% nebulizer solution Take 3 mLs (2.5 mg total) by nebulization every 6 (six) hours as needed for wheezing or shortness of breath. 75 mL 12   cyclobenzaprine (FLEXERIL) 10 MG tablet Take 1 tablet (10 mg total) by mouth 2 (two) times daily as needed for muscle spasms. 20 tablet 0   magnesium hydroxide (MILK OF MAGNESIA) 400 MG/5ML suspension Take 15 mLs by mouth daily as needed for mild constipation.     ondansetron (ZOFRAN-ODT) 4 MG disintegrating tablet Take 4 mg by mouth 2 (two) times daily as needed for nausea/vomiting.     oxyCODONE (ROXICODONE) 15 MG immediate release tablet Take 15 mg by mouth every 4 (four) hours.     Fluticasone-Umeclidin-Vilant (TRELEGY ELLIPTA) 100-62.5-25 MCG/INH AEPB Inhale 1 puff into the lungs daily. 28 each 1    No results found for this or any previous visit (  from the past 48 hour(s)). No results found.  Review of Systems  Musculoskeletal:  Positive for neck pain.  Neurological:  Positive for weakness and numbness.   Blood pressure 124/72, pulse 74, temperature 97.7 F (36.5 C), temperature source Oral, resp. rate 18, height _0  (1.753 m), weight 74.8 kg, SpO2 94 %. Physical Exam HENT:     Head: Normocephalic.     Nose: Nose normal.     Mouth/Throat:     Mouth: Mucous membranes are moist.  Eyes:     Pupils: Pupils are equal, round, and reactive to light.  Cardiovascular:     Rate and Rhythm: Normal rate.  Pulmonary:     Effort:  Pulmonary effort is normal.  Abdominal:     General: Abdomen is flat.  Musculoskeletal:        General: Normal range of motion.  Skin:    General: Skin is warm.  Neurological:     Mental Status: He is alert.     Comments: Patient is awake and alert strength is 5 out of 5 deltoids bilaterally bicep and tricep on the right is slightly weak at 4+ out of 5 also 4+ out of 5 grip strength in the right left upper extremity 5 out of 5 deltoid, bicep, tricep, wrist extension, wrist extension, hand intrinsics.     Assessment/Plan 60 year old presents for ACDF C4-5 C5-6  Elaina Hoops, MD 02/23/2021, 7:26 AM

## 2021-02-23 NOTE — Anesthesia Procedure Notes (Signed)
Procedure Name: Intubation Date/Time: 02/23/2021 7:48 AM Performed by: Demetrio Lapping, CRNA Pre-anesthesia Checklist: Patient identified, Emergency Drugs available, Suction available and Patient being monitored Patient Re-evaluated:Patient Re-evaluated prior to induction Oxygen Delivery Method: Circle System Utilized Preoxygenation: Pre-oxygenation with 100% oxygen Induction Type: IV induction Ventilation: Mask ventilation without difficulty Laryngoscope Size: Glidescope and 4 Grade View: Grade I Tube type: Oral Tube size: 7.5 mm Number of attempts: 1 Airway Equipment and Method: Rigid stylet Placement Confirmation: ETT inserted through vocal cords under direct vision, positive ETCO2 and breath sounds checked- equal and bilateral Secured at: 22 cm Tube secured with: Tape Dental Injury: Teeth and Oropharynx as per pre-operative assessment

## 2021-02-23 NOTE — Anesthesia Postprocedure Evaluation (Signed)
Anesthesia Post Note  Patient: [name] Carter  Procedure(s) Performed: ANTERIOR CERVICAL DECOMPRESSION FUSION  - CERVICAL FOUR-CERVICAL FIVE - CERVICAL FIVE-CERVICAL SIX     Patient location during evaluation: PACU Anesthesia Type: General Level of consciousness: awake and alert, patient cooperative and oriented Pain management: pain level controlled Vital Signs Assessment: post-procedure vital signs reviewed and stable Respiratory status: spontaneous breathing, nonlabored ventilation and respiratory function stable Cardiovascular status: blood pressure returned to baseline and stable Postop Assessment: no apparent nausea or vomiting Anesthetic complications: no   No notable events documented.  Last Vitals:  Vitals:   02/23/21 1005 02/23/21 1020  BP: 92/63 (!) 96/59  Pulse: 65 65  Resp: 14 14  Temp: 37.8 C   SpO2: 97% 94%    Last Pain:  Vitals:   02/23/21 1005  TempSrc:   PainSc: Asleep                 Jenner Rosier,E. Shakhia Gramajo

## 2021-02-23 NOTE — Transfer of Care (Signed)
Immediate Anesthesia Transfer of Care Note  Patient: [name] Carter  Procedure(s) Performed: ANTERIOR CERVICAL DECOMPRESSION FUSION  - CERVICAL FOUR-CERVICAL FIVE - CERVICAL FIVE-CERVICAL SIX  Patient Location: PACU  Anesthesia Type:General  Level of Consciousness: sedated  Airway & Oxygen Therapy: Patient Spontanous Breathing and Patient connected to nasal cannula oxygen  Post-op Assessment: Report given to RN and Post -op Vital signs reviewed and stable  Post vital signs: Reviewed and stable  Last Vitals:  Vitals Value Taken Time  BP 92/63 02/23/21 1006  Temp    Pulse 68 02/23/21 1008  Resp 16 02/23/21 1008  SpO2 96 % 02/23/21 1008  Vitals shown include unvalidated device data.  Last Pain:  Vitals:   02/23/21 0619  TempSrc:   PainSc: 8       Patients Stated Pain Goal: 3 (02/23/21 8299)  Complications: No notable events documented.

## 2021-02-23 NOTE — Progress Notes (Signed)
Orthopedic Tech Progress Note Patient Details:  [name] 09-10-1960 [id number]  Patient has SOFT COLLAR   Patient ID: [name] Carter, male   DOB: 11/21/1960, 60 y.o.   MRN: [id number]  [name] 02/23/2021, 10:22 AM

## 2021-02-23 NOTE — Op Note (Signed)
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  Postoperative diagnosis: Same  Procedure: Anterior cervical discectomies and fusion at C4-5 and C5-6 utilizing the globus titanium cages packed with locally harvested autograft mixed with division and anterior cervical plating utilizing the globus extend plating system  Surgeon: Jillyn Hidden Shawnte Demarest  Assistant: Julien Girt  Anesthesia: General  EBL: Minimal  HPI: 60 year old gentleman progressive worsening neck and primarily right shoulder and arm pain rating down the C5-C6 nerve root pattern work-up revealed severe cervical spondylosis with collapse and some instability at C4-5 as well as at C5-6.  Due to patient progression of clinical syndrome imaging findings and failed conservative treatment I recommended decompressive cervical discectomy anteriorly with interbody fusion and anterior cervical plating.  I extensively went over the risks and benefits of that operation with him as well as perioperative course expectations of outcome and alternatives of surgery and he understood and agreed to proceed forward.  Operative procedure: Patient was brought into the OR was induced under general anesthesia positioned supine the neck in slight extension 5 pounds halter traction the right side of his neck was prepped and draped in routine sterile fashion preoperative x-ray localized the appropriate level.  A curvilinear incision was made just off the midline to the anterior border of the sternocleidomastoid and the superficial of platysma was dissected out divided longitudinally the avascular plane between the sternomastoid and strap muscle was developed down to the prevertebral fascia.  In the prevertebral fascia was dissected away with Kitners.  Intraoperative x-ray confirmed identification appropriate level so this was marked with a 15 blade scalpel with incising the annulus along with close was then reflected  laterally and self-retaining retractors were placed.  Then both disc base were further incised anterior osteophytes were bitten off with a Leksell rongeur and a 2 and 3 Miller Kerrison punch.  Both disc bases were drilled down the posterior annulus and osteophytic complex capturing the bone shavings and mucus trap.  Then under microscopic lamination first working at C5-6 further drilling down the posterior osteophytic complexes allowed identification of posterior large ligament which was removed in piecemeal fashion exposing the thecal sac.  Aggressive under biting of both endplates was carried out decompressing the central canal marching laterally both C6 nerve roots were identified and skeletonized flush with the pedicle.  At the end discectomies no further stenosis either centrally or foraminally this was packed with Surgifoam tension taken at C4-5.  In a similar fashion C4-5 was further drilled down posterior annulus and posterior large ligament was removed in piecemeal fashion decompressing central canal and both C5 nerve roots.  Aggressive undermining both endplates and the uncinate helped unroofed the C5 neuroforamen which was decompressing the C5 nerve roots as well as aggressive under biting both endplates decompressing central canal.  I then sized up a 6 mm cage for C5-6 and a 7 mm for C4-5 both were inserted approximately 2 mm deep to the anterior vertebral line.  I then selected a 32 mm globus extend plate plate was aligned all screws were placed had excellent purchase lock mechanisms were engaged some additional bone graft been packed laterally to the cages and underneath the plate.  Meticulous hemostasis was maintained the wound was copiously irrigated and the wound was closed in layers with interrupted Vicryl in the platysma and a running 4-0 subcuticular Dermabond benzoin Steri-Strips and a sterile dressing was applied patient recovery in stable condition.  At the end the case all needle counts and  sponge counts were correct.

## 2021-02-28 ENCOUNTER — Emergency Department (HOSPITAL_COMMUNITY): Payer: PPO

## 2021-02-28 ENCOUNTER — Emergency Department (HOSPITAL_COMMUNITY)
Admission: EM | Admit: 2021-02-28 | Discharge: 2021-02-28 | Disposition: A | Discharge status: Home / Self Care | Payer: PPO | Attending: Emergency Medicine | Admitting: Emergency Medicine

## 2021-02-28 ENCOUNTER — Encounter (HOSPITAL_COMMUNITY): Payer: Self-pay | Admitting: Emergency Medicine

## 2021-02-28 ENCOUNTER — Other Ambulatory Visit: Payer: Self-pay

## 2021-02-28 DIAGNOSIS — D72829 Elevated white blood cell count, unspecified: Secondary | ICD-10-CM | POA: Diagnosis not present

## 2021-02-28 DIAGNOSIS — M47812 Spondylosis without myelopathy or radiculopathy, cervical region: Secondary | ICD-10-CM | POA: Diagnosis not present

## 2021-02-28 DIAGNOSIS — J441 Chronic obstructive pulmonary disease with (acute) exacerbation: Secondary | ICD-10-CM | POA: Diagnosis not present

## 2021-02-28 DIAGNOSIS — E871 Hypo-osmolality and hyponatremia: Secondary | ICD-10-CM | POA: Diagnosis not present

## 2021-02-28 DIAGNOSIS — L0211 Cutaneous abscess of neck: Secondary | ICD-10-CM | POA: Diagnosis not present

## 2021-02-28 DIAGNOSIS — F17210 Nicotine dependence, cigarettes, uncomplicated: Secondary | ICD-10-CM | POA: Diagnosis not present

## 2021-02-28 DIAGNOSIS — J45909 Unspecified asthma, uncomplicated: Secondary | ICD-10-CM | POA: Diagnosis not present

## 2021-02-28 DIAGNOSIS — M5134 Other intervertebral disc degeneration, thoracic region: Secondary | ICD-10-CM | POA: Diagnosis not present

## 2021-02-28 DIAGNOSIS — Z7951 Long term (current) use of inhaled steroids: Secondary | ICD-10-CM | POA: Diagnosis not present

## 2021-02-28 DIAGNOSIS — M542 Cervicalgia: Secondary | ICD-10-CM | POA: Insufficient documentation

## 2021-02-28 DIAGNOSIS — R739 Hyperglycemia, unspecified: Secondary | ICD-10-CM | POA: Diagnosis not present

## 2021-02-28 DIAGNOSIS — Z8701 Personal history of pneumonia (recurrent): Secondary | ICD-10-CM | POA: Diagnosis not present

## 2021-02-28 DIAGNOSIS — I2510 Atherosclerotic heart disease of native coronary artery without angina pectoris: Secondary | ICD-10-CM | POA: Diagnosis not present

## 2021-02-28 DIAGNOSIS — J449 Chronic obstructive pulmonary disease, unspecified: Secondary | ICD-10-CM | POA: Diagnosis not present

## 2021-02-28 DIAGNOSIS — Z20822 Contact with and (suspected) exposure to covid-19: Secondary | ICD-10-CM | POA: Insufficient documentation

## 2021-02-28 DIAGNOSIS — R059 Cough, unspecified: Secondary | ICD-10-CM | POA: Diagnosis not present

## 2021-02-28 DIAGNOSIS — I10 Essential (primary) hypertension: Secondary | ICD-10-CM | POA: Diagnosis not present

## 2021-02-28 DIAGNOSIS — M4312 Spondylolisthesis, cervical region: Secondary | ICD-10-CM | POA: Diagnosis not present

## 2021-02-28 LAB — CBC WITH DIFFERENTIAL/PLATELET
Abs Immature Granulocytes: 0.05 10*3/uL (ref 0.00–0.07)
Basophils Absolute: 0 10*3/uL (ref 0.0–0.1)
Basophils Relative: 0 %
Eosinophils Absolute: 0.2 10*3/uL (ref 0.0–0.5)
Eosinophils Relative: 2 %
HCT: 48.1 % (ref 39.0–52.0)
Hemoglobin: 15.8 g/dL (ref 13.0–17.0)
Immature Granulocytes: 1 %
Lymphocytes Relative: 20 %
Lymphs Abs: 2.2 10*3/uL (ref 0.7–4.0)
MCH: 30.3 pg (ref 26.0–34.0)
MCHC: 32.8 g/dL (ref 30.0–36.0)
MCV: 92.3 fL (ref 80.0–100.0)
Monocytes Absolute: 1 10*3/uL (ref 0.1–1.0)
Monocytes Relative: 9 %
Neutro Abs: 7.3 10*3/uL (ref 1.7–7.7)
Neutrophils Relative %: 68 %
Platelets: 240 10*3/uL (ref 150–400)
RBC: 5.21 MIL/uL (ref 4.22–5.81)
RDW: 12.4 % (ref 11.5–15.5)
WBC: 10.8 10*3/uL — ABNORMAL HIGH (ref 4.0–10.5)
nRBC: 0 % (ref 0.0–0.2)

## 2021-02-28 LAB — BASIC METABOLIC PANEL
Anion gap: 8 (ref 5–15)
BUN: 19 mg/dL (ref 6–20)
CO2: 28 mmol/L (ref 22–32)
Calcium: 8.6 mg/dL — ABNORMAL LOW (ref 8.9–10.3)
Chloride: 95 mmol/L — ABNORMAL LOW (ref 98–111)
Creatinine, Ser: 0.95 mg/dL (ref 0.61–1.24)
GFR, Estimated: >60 mL/min (ref >60)
Glucose, Bld: 116 mg/dL — ABNORMAL HIGH (ref 70–99)
Potassium: 4.1 mmol/L (ref 3.5–5.1)
Sodium: 131 mmol/L — ABNORMAL LOW (ref 135–145)

## 2021-02-28 LAB — POC SARS CORONAVIRUS 2 AG: SARSCOV2ONAVIRUS 2 AG: NEGATIVE

## 2021-02-28 MED ORDER — OXYCODONE-ACETAMINOPHEN 5-325 MG PO TABS
1.0000 | ORAL_TABLET | Freq: Once | ORAL | Status: AC
  Administered 2021-02-28: 1 via ORAL
  Filled 2021-02-28: qty 1

## 2021-02-28 MED ORDER — IOHEXOL 350 MG/ML SOLN
100.0000 mL | Freq: Once | INTRAVENOUS | Status: AC | PRN
  Administered 2021-02-28: 60 mL via INTRAVENOUS

## 2021-02-28 MED ORDER — HYDROMORPHONE HCL 1 MG/ML IJ SOLN
1.0000 mg | Freq: Once | INTRAMUSCULAR | Status: AC
  Administered 2021-02-28: 1 mg via INTRAVENOUS
  Filled 2021-02-28: qty 1

## 2021-02-28 MED ORDER — DEXAMETHASONE SODIUM PHOSPHATE 10 MG/ML IJ SOLN
8.0000 mg | Freq: Once | INTRAMUSCULAR | Status: AC
  Administered 2021-02-28: 8 mg via INTRAVENOUS
  Filled 2021-02-28: qty 1

## 2021-02-28 MED ORDER — PREDNISONE 10 MG PO TABS: 30.0000 mg | ORAL_TABLET | Freq: Every day | ORAL | 0 refills | Status: AC

## 2021-02-28 MED ORDER — HYDROMORPHONE HCL 1 MG/ML IJ SOLN
1.0000 mg | Freq: Once | INTRAMUSCULAR | Status: DC
Start: 2021-02-28 — End: 2021-02-28

## 2021-02-28 NOTE — Discharge Instructions (Addendum)
Lab work and imaging were reassuring.  Have started you on steroids please take as prescribed.  Please continue with the pain medication that were prescribed to you.  Dr. Wynetta Emery like to see tomorrow please call his office for a follow-up appointment.  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  Come back to the emergency department if you develop chest pain, shortness of breath, severe abdominal pain, unable to swallow your own saliva, uncontrolled nausea, vomiting, diarrhea.

## 2021-02-28 NOTE — ED Notes (Signed)
Pt teaching provided on medications that may cause drowsiness. Pt instructed not to drive or operate heavy machinery while taking the prescribed medication. Pt verbalized understanding.  ? ?Pt provided discharge instructions and prescription information. Pt was given the opportunity to ask questions and questions were answered. Discharge signature not obtained in the setting of the COVID-19 pandemic in order to reduce high touch surfaces.  ? ?

## 2021-02-28 NOTE — ED Triage Notes (Signed)
Pt had neck surgery 02/23/21. Pt started coughing on Friday. Feel like he has "ripped" something from coughing. C/o of sob and cough and neck pain

## 2021-02-28 NOTE — ED Provider Notes (Signed)
[facility] EMERGENCY DEPARTMENT Provider Note   CSN: [id number] Arrival date & time: 02/28/21  0102     History Chief Complaint  Patient presents with   Neck Pain    [name] Carter is a 60 y.o. male.  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 Saintclair Halsted presents to the emergency department with chief complaint of neck pain.  Patient states pain started on Friday, he notes that he has been having a persistent productive cough, states he has been coughing up some phlegm, denies hemoptysis, denies nausea or vomiting, states the pain is persistent, worsened after he coughs, he will have occasional paresthesias in his upper extremities, states he has pain when he swallows but is still physically able to do so.  He denies subjective fevers chills, nasal congestion, chest pain, shortness of breath, general body aches.  He is vaccines COVID-19, denies recent sick contacts.  He has been taking oxycodone which has not been helping with his pain.     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   GERD (gastroesophageal reflux disease) 05/20/2015   Community acquired pneumonia 04/25/2015   Abdominal pain, epigastric 02/12/2015   LLQ pain 02/12/2015   Gastritis and gastroduodenitis 02/12/2015   Constipation 02/12/2015   COPD exacerbation ([place]) 08/20/2014   COPD with acute  exacerbation ([place]) 06/27/2014   Chronic pain 06/27/2014   Acute respiratory failure with hypoxia ([place]) 06/13/2014   CAP (community acquired pneumonia) 05/16/2014   Chest pain    Acute respiratory failure ([place]) 05/13/2014   Tobacco dependence 05/13/2014    Past Surgical History:  Procedure Laterality Date   ANTERIOR CERVICAL DECOMP/DISCECTOMY FUSION N/A 02/23/2021   Procedure: ANTERIOR CERVICAL DECOMPRESSION FUSION  - CERVICAL FOUR-CERVICAL FIVE - CERVICAL FIVE-CERVICAL SIX;  Surgeon: Kary Kos, MD;  Location: [place];  Service: Neurosurgery;  Laterality: N/A;   BACK SURGERY     low back x4   BILATERAL CARPAL TUNNEL RELEASE     bilteral knee surgery     COLONOSCOPY  07/03/2012   [place] MD: MAC. Random colon biopsies benign. Rectal polyp tubular adenoma.   ESOPHAGOGASTRODUODENOSCOPY  07/03/2012   [place] MD: MAC. Chronic gastritis and intestinal metaplasia, ?metaplastic atrophic gastritis, negative H.pylori, no celiac, duidnal mucosa with mild Brunner gland hyperplasia, nonspecific.    LEFT HEART CATH AND CORONARY ANGIOGRAPHY N/A 11/07/2017   Procedure: LEFT HEART CATH AND CORONARY ANGIOGRAPHY;  Surgeon: Belva Crome, MD;  Location: [place] [facility];  Service: Cardiovascular;  Laterality: N/A;   TONSILLECTOMY     removed at age 47   [place]     ? by description, not clear       Family History  Problem Relation Age of Onset   Heart attack Father    Colon cancer Father  diagnosed in his 29s   Heart attack Mother     Social History   Tobacco Use   Smoking status: Heavy Smoker    Packs/day: 1.00    Years: 41.00    Pack years: 41.00    Types: Cigarettes    Start date: 35   Smokeless tobacco: Never   Tobacco comments:    one pack daily  Vaping Use   Vaping Use: Never used  Substance Use Topics   Alcohol use: No    Alcohol/week: 0.0 standard drinks   Drug use: No    Home Medications Prior to Admission medications   Medication Sig  Start Date End Date Taking? Authorizing Provider  predniSONE (DELTASONE) 10 MG tablet Take 3 tablets (30 mg total) by mouth daily for 5 days. 02/28/21 03/05/21 Yes Marcello Fennel, PA-C  albuterol (PROVENTIL HFA;VENTOLIN HFA) 108 (90 BASE) MCG/ACT inhaler Inhale 2 puffs into the lungs every 4 (four) hours as needed for wheezing or shortness of breath. 08/21/14   Samuella Cota, MD  albuterol (PROVENTIL) (2.5 MG/3ML) 0.083% nebulizer solution Take 3 mLs (2.5 mg total) by nebulization every 6 (six) hours as needed for wheezing or shortness of breath. 09/18/17   Sinda Du, MD  cyclobenzaprine (FLEXERIL) 10 MG tablet Take 1 tablet (10 mg total) by mouth 2 (two) times daily as needed for muscle spasms. 11/28/20   Marcello Fennel, PA-C  Fluticasone-Umeclidin-Vilant (TRELEGY ELLIPTA) 100-62.5-25 MCG/INH AEPB Inhale 1 puff into the lungs daily. 11/02/20   Johnson, Clanford L, MD  magnesium hydroxide (MILK OF MAGNESIA) 400 MG/5ML suspension Take 15 mLs by mouth daily as needed for mild constipation.    [provider]  ondansetron (ZOFRAN-ODT) 4 MG disintegrating tablet Take 4 mg by mouth 2 (two) times daily as needed for nausea/vomiting. 01/18/21   [provider]  oxyCODONE (ROXICODONE) 15 MG immediate release tablet Take 15 mg by mouth every 4 (four) hours. 03/13/19   [provider]    Allergies    Vicodin [hydrocodone-acetaminophen]  Review of Systems   Review of Systems  Constitutional:  Negative for chills and fever.  HENT:  Positive for sore throat and trouble swallowing. Negative for congestion.   Respiratory:  Positive for cough. Negative for shortness of breath.   Cardiovascular:  Negative for chest pain.  Gastrointestinal:  Negative for abdominal pain, diarrhea, nausea and vomiting.  Genitourinary:  Negative for enuresis.  Musculoskeletal:  Positive for neck pain. Negative for back pain and myalgias.  Skin:  Negative for rash.  Neurological:  Negative for  headaches.  Hematological:  Does not bruise/bleed easily.   Physical Exam Updated Vital Signs BP 130/82   Pulse 79   Temp 98 F (36.7 C) (Oral)   Resp 20   Ht _0  (1.753 m)   Wt 74.8 kg   SpO2 91%   BMI 24.37 kg/m   Physical Exam Vitals and nursing note reviewed.  Constitutional:      General: He is not in acute distress.    Appearance: He is not ill-appearing.  HENT:     Head: Normocephalic and atraumatic.     Nose: No congestion.     Mouth/Throat:     Mouth: Mucous membranes are moist.     Pharynx: Oropharynx is clear. No oropharyngeal exudate or posterior oropharyngeal erythema.     Comments: Oropharynx is visualized tongue uvula are both midline, controlling oral secretions.  No trismus or torticollis present. Eyes:     Conjunctiva/sclera: Conjunctivae normal.  Neck:     Comments: Patient has a incision site on the right anterior aspect of the neck, Steri-Strips are intact, there no surrounding erythema or edema, no active drainage or discharge present. Cardiovascular:     Rate and Rhythm: Normal rate and regular rhythm.     Pulses: Normal pulses.     Heart sounds: No murmur heard.   No friction rub. No gallop.  Pulmonary:     Effort: No respiratory distress.     Breath sounds: No wheezing, rhonchi or rales.     Comments: Patient has noted rhonchi bilaterally noted in the upper and lower lobes, will clear with a cough.  No rales, wheezing, stridor present. Skin:    General: Skin is warm and dry.  Neurological:     Mental Status: He is alert.  Psychiatric:        Mood and Affect: Mood normal.    ED Results / Procedures / Treatments   Labs (all labs ordered are listed, but only abnormal results are displayed) Labs Reviewed  BASIC METABOLIC PANEL - Abnormal; Notable for the following components:      Result Value   Sodium 131 (*)    Chloride 95 (*)    Glucose, Bld 116 (*)    Calcium 8.6 (*)    All other components within normal limits  CBC WITH  DIFFERENTIAL/PLATELET - Abnormal; Notable for the following components:   WBC 10.8 (*)    All other components within normal limits  CULTURE, BLOOD (ROUTINE X 2)  CULTURE, BLOOD (ROUTINE X 2)  POC SARS CORONAVIRUS 2 AG    EKG   Radiology DG Chest 1 View  Result Date: 02/28/2021 CLINICAL DATA:  Cough EXAM: CHEST  1 VIEW COMPARISON:  Chest radiograph 10/31/2020 FINDINGS: The cardiomediastinal silhouette is normal. Somewhat coarsened interstitial markings bilaterally are unchanged. There is no focal consolidation or pulmonary edema. There is no pleural effusion or pneumothorax. There is no acute osseous abnormality. IMPRESSION: No radiographic evidence of acute cardiopulmonary process. Electronically Signed   By: Valetta Mole M.D.   On: 02/28/2021 11:07   DG Cervical Spine Complete  Result Date: 02/28/2021 CLINICAL DATA:  Recent cervical spine fusion last Wednesday, started coughing yesterday and now has neck and arm pain, history asthma, COPD, hypertension, pneumonia, smoker EXAM: CERVICAL SPINE - COMPLETE 4+ VIEW COMPARISON:  02/23/2021 FINDINGS: Significant prevertebral soft tissue thickening, could be related to surgery but postoperative fluid collections such as hematoma and infection cannot be excluded. Two tiny foci of soft tissue gas are seen at the surgical bed. Anterior plate and screws at I2-L7 with intervening disc prostheses. Bones appear slightly demineralized. Disc space narrowing and endplate spur formation at C6-C7 and C7-T1. Vertebral body heights maintained without fracture or subluxation. Foramina suboptimally profiled. Lung apices clear. Atherosclerotic calcifications at the carotid bifurcations bilaterally. IMPRESSION: Postoperative from anterior cervical spinal fusion C4-C6. Degenerative disc disease changes at C6-T1. No acute osseous abnormalities. Significant prevertebral soft tissue swelling, may represent edema following surgery though postoperative fluid collection such as  hematoma and infection cannot be excluded; if there is clinical concern for retropharyngeal space infection, recommend CT neck with contrast. Electronically Signed   By: Lavonia Dana M.D.   On: 02/28/2021 11:10   CT Soft Tissue Neck W Contrast  Result Date: 02/28/2021 CLINICAL DATA:  Deep tissue neck abscess. EXAM: CT NECK WITH CONTRAST TECHNIQUE: Multidetector CT imaging of the neck was performed using the standard protocol following the bolus administration of intravenous contrast. CONTRAST:  72m OMNIPAQUE IOHEXOL 350 MG/ML SOLN COMPARISON:  Cervical spine radiographs 02/28/2021. Intraoperative radiographs 02/23/2021. FINDINGS: Pharynx and larynx: No focal mucosal lesions are present. Salivary glands: The submandibular and parotid glands and ducts are within normal limits. Thyroid: Normal. Lymph nodes: No significant cervical adenopathy is present. Vascular: Minimal atherosclerotic changes are present at the carotid bifurcations bilaterally. Calcifications are more prominent left than right. No significant stenosis is present. Limited intracranial: Unremarkable Visualized orbits: The globes and orbits are within normal limits. Mastoids and visualized paranasal sinuses: The paranasal sinuses and mastoid air cells are clear. Skeleton: Anterior plate and screw fixation present C3, C4, and C5. Disc spacers place at each level. The right-sided screw at C3 traverses the inferior endplate. No significant lucency around the screws or separation of the plate from the ventral surface of the vertebral bodies. Slight degenerative anterolisthesis present at C3-4. Facet hypertrophy contributes to foraminal narrowing on the right at C3-4. Residual osseous foraminal narrowing right greater than left at C4-5, C5-6, and C6-7. Moderate foraminal narrowing present bilaterally at C7-T1 due to uncovertebral spurring. No osseous lesions present. Upper chest: Lung apices are clear. Thoracic inlet is within normal limits. Other:  Prominent prevertebral fluid collection noted at the operative site measuring 5.4 x 1.8 x 3.9 cm. Mild peripheral enhancement is present. There is some gas within the collection adjacent to the hardware. IMPRESSION: 1. Prominent rim enhancing prevertebral fluid collection at the operative site measures 5.4 x 1.8 x 3.9 cm. This is concerning for a developing abscess. 2. The right-sided screw at C3 traverses the inferior endplate of C3. 3. Residual osseous foraminal narrowing right greater than left at C4-5, C5-6, and C6-7. 4. Moderate foraminal narrowing bilaterally at C7-T1 due to uncovertebral spurring. Electronically Signed   By: CSan MorelleM.D.   On: 02/28/2021 15:25    Procedures Procedures   Medications Ordered in ED Medications  HYDROmorphone (DILAUDID) injection 1 mg (1 mg Intravenous Given 02/28/21 1420)  iohexol (OMNIPAQUE) 350 MG/ML injection 100 mL (60 mLs Intravenous Contrast Given 02/28/21 1456)  HYDROmorphone (DILAUDID) injection 1 mg (1 mg Intravenous Given 02/28/21 1630)  oxyCODONE-acetaminophen (PERCOCET/ROXICET) 5-325 MG per tablet 1 tablet (1 tablet Oral Given 02/28/21 1753)  dexamethasone (DECADRON) injection 8 mg (8 mg Intravenous Given 02/28/21 1753)    ED Course  I have reviewed the triage vital signs and the nursing notes.  Pertinent labs & imaging results that were available during my care of the patient were reviewed by me and considered in my medical decision making (see chart for details).    MDM Rules/Calculators/A&P                          Initial impression-presents with neck pain and a cough.  Will obtain basic lab work-up, obtain imaging of neck and chest and reassess.  Work-up-CBC shows slight leukocytosis of 10.8, BMP shows hyponatremia of 131, hyperglycemia 116, chest x-ray unremarkable, x-ray of cervical spine reveals significant prevertebral soft tissue swelling may represent edema versus possible infection if concern for infection recommend CT  neck with further evaluation.  Rapid COVID test is negative CT neck with contrast reveals prominent enhancing prevertebral fluid collection at the operation site consistent with developing abscess.  Reassessment-patient was assessed after pain medications, states he is feeling much better, still has slight pain.  Due to x-ray with increased swelling concern for possible developing abscess will obtain CT neck for further evaluation.  CT neck concern for abscess will consult  with neurosurgery for further recommendations, will also obtain blood cultures and continue to monitor.  Patient was reassessed, he is tolerant p.o. without difficulty, vital signs remained stable, updated him on recommendations from neurosurgery he is in agreement this plan will DC at this time.  Patient was made aware of new right bundle branch block we will have him follow-up with cardiology.  Consult-spoke with Dr. Ellene Route as well as Dr. Saintclair Halsted who reviewed the imaging and do not feel that the fluid collection is an abscess.  Recommend a dose of Decadron here in the emergency department started him on steroids and he will follow-up with the patient tomorrow morning.  Rule out-low suspicion for systemic infection as patient is nontoxic-appearing, vital signs reassuring.  He does have slight leukocytosis of 10.8 but I feel this is more of acute phase reactant.  I have low suspicion for spinal cord abnormality or spinal fracture as spine was palpated was nontender to palpation, he has full range of motion in the upper and lower extremities, CT imaging is negative for orthopedic injury.  I have low suspicion for peritonsillar abscess or retropharyngeal abscess as imaging is negative for this.   Low suspicion  for strep throat as there is no signs of exudates or erythema present my exam.  Plan-  Neck pain-likely patient has buildup of fluid post surgery, will start him on steroids as directed by neurosurgery, and he will follow-up with  them for reevaluation.  Given strict return precautions.  Vital signs have remained stable, no indication for hospital admission.   Patient given at home care as well strict return precautions.  Patient verbalized that they understood agreed to said plan.  Final Clinical Impression(s) / ED Diagnoses Final diagnoses:  Neck pain    Rx / DC Orders ED Discharge Orders          Ordered    predniSONE (DELTASONE) 10 MG tablet  Daily        02/28/21 1813             Aron Baba 02/28/21 1817    Noemi Chapel, MD 03/01/21 0830

## 2021-03-05 LAB — CULTURE, BLOOD (ROUTINE X 2)
Culture: NO GROWTH
Culture: NO GROWTH
Special Requests: ADEQUATE
Special Requests: ADEQUATE

## 2021-03-08 ENCOUNTER — Encounter (HOSPITAL_COMMUNITY): Payer: Self-pay | Admitting: Neurosurgery

## 2021-03-08 ENCOUNTER — Other Ambulatory Visit: Payer: Self-pay | Admitting: Neurosurgery

## 2021-03-08 ENCOUNTER — Other Ambulatory Visit: Payer: Self-pay

## 2021-03-08 DIAGNOSIS — M542 Cervicalgia: Secondary | ICD-10-CM | POA: Diagnosis not present

## 2021-03-08 NOTE — Progress Notes (Signed)
PCP - Dr. Wynelle Link  Cardiologist - Dr. Wyline Mood EKG - 03/01/21 Chest x-ray -  ECHO - 05/16/19 Cardiac Cath - 11/07/17 CPAP -   ERAS Protcol - n/a - clears until 1330  COVID TEST- DOS  Anesthesia review: n/a  -------------  SDW INSTRUCTIONS:  Your procedure is scheduled on 9/7. Please report to [facility] Main Entrance "A" at 1330 PM., and check in at the Admitting office. Call this number if you have problems the morning of surgery: [phone]   Remember: Do not eat after midnight the night before your surgery  You may drink clear liquids until 1330 day of your surgery.   Clear liquids allowed are: Water, Non-Citrus Juices (without pulp), Carbonated Beverages, Clear Tea, Black Coffee Only, and Gatorade   Medications to take morning of surgery with a sip of water include: Inhaler --- Please bring all inhalers with you the day of surgery.  Flexeril prn Oxycodone prn   As of today, STOP taking any Aspirin (unless otherwise instructed by your surgeon), Aleve, Naproxen, Ibuprofen, Motrin, Advil, Goody's, BC's, all herbal medications, fish oil, and all vitamins.    The Morning of Surgery Do not wear jewelry Do not wear lotions, powders, colognes, or deodorant Do not bring valuables to the hospital. [facility] is not responsible for any belongings or valuables.  If you are a smoker, DO NOT Smoke 24 hours prior to surgery  If you wear a CPAP at night please bring your mask the morning of surgery   Remember that you must have someone to transport you home after your surgery, and remain with you for 24 hours if you are discharged the same day.  Please bring cases for contacts, glasses, hearing aids, dentures or bridgework because it cannot be worn into surgery.   Patients discharged the day of surgery will not be allowed to drive home.   Please shower the NIGHT BEFORE/MORNING OF SURGERY (use antibacterial soap like DIAL soap if possible). Wear comfortable clothes the morning of  surgery. Oral Hygiene is also important to reduce your risk of infection.  Remember - BRUSH YOUR TEETH THE MORNING OF SURGERY WITH YOUR REGULAR TOOTHPASTE  Patient denies shortness of breath, fever, cough and chest pain.

## 2021-03-09 ENCOUNTER — Inpatient Hospital Stay (HOSPITAL_COMMUNITY): Payer: PPO | Admitting: Anesthesiology

## 2021-03-09 ENCOUNTER — Inpatient Hospital Stay (HOSPITAL_COMMUNITY)
Admission: RE | Admit: 2021-03-09 | Discharge: 2021-03-10 | DRG: 989 | Disposition: A | Discharge status: Home / Self Care | Payer: PPO | Attending: Neurosurgery | Admitting: Neurosurgery

## 2021-03-09 ENCOUNTER — Ambulatory Visit (HOSPITAL_COMMUNITY)
Admission: RE | Disposition: A | Discharge status: Home / Self Care | Payer: Self-pay | Source: Home / Self Care | Attending: Neurosurgery

## 2021-03-09 ENCOUNTER — Encounter (HOSPITAL_COMMUNITY): Payer: Self-pay | Admitting: Neurosurgery

## 2021-03-09 ENCOUNTER — Other Ambulatory Visit: Payer: Self-pay

## 2021-03-09 DIAGNOSIS — R1310 Dysphagia, unspecified: Secondary | ICD-10-CM | POA: Diagnosis not present

## 2021-03-09 DIAGNOSIS — M5030 Other cervical disc degeneration, unspecified cervical region: Secondary | ICD-10-CM | POA: Diagnosis present

## 2021-03-09 DIAGNOSIS — J449 Chronic obstructive pulmonary disease, unspecified: Secondary | ICD-10-CM | POA: Diagnosis not present

## 2021-03-09 DIAGNOSIS — Z885 Allergy status to narcotic agent status: Secondary | ICD-10-CM | POA: Diagnosis not present

## 2021-03-09 DIAGNOSIS — E876 Hypokalemia: Secondary | ICD-10-CM | POA: Diagnosis not present

## 2021-03-09 DIAGNOSIS — Z79899 Other long term (current) drug therapy: Secondary | ICD-10-CM

## 2021-03-09 DIAGNOSIS — F17210 Nicotine dependence, cigarettes, uncomplicated: Secondary | ICD-10-CM | POA: Diagnosis not present

## 2021-03-09 DIAGNOSIS — G8929 Other chronic pain: Secondary | ICD-10-CM | POA: Diagnosis not present

## 2021-03-09 DIAGNOSIS — Z8249 Family history of ischemic heart disease and other diseases of the circulatory system: Secondary | ICD-10-CM

## 2021-03-09 DIAGNOSIS — M5136 Other intervertebral disc degeneration, lumbar region: Secondary | ICD-10-CM | POA: Diagnosis not present

## 2021-03-09 DIAGNOSIS — Z20822 Contact with and (suspected) exposure to covid-19: Secondary | ICD-10-CM | POA: Diagnosis not present

## 2021-03-09 DIAGNOSIS — Z800 Family history of malignant neoplasm of digestive organs: Secondary | ICD-10-CM

## 2021-03-09 DIAGNOSIS — J441 Chronic obstructive pulmonary disease with (acute) exacerbation: Secondary | ICD-10-CM | POA: Diagnosis not present

## 2021-03-09 DIAGNOSIS — I10 Essential (primary) hypertension: Secondary | ICD-10-CM | POA: Diagnosis not present

## 2021-03-09 DIAGNOSIS — Z981 Arthrodesis status: Secondary | ICD-10-CM | POA: Diagnosis not present

## 2021-03-09 DIAGNOSIS — M96840 Postprocedural hematoma of a musculoskeletal structure following a musculoskeletal system procedure: Secondary | ICD-10-CM | POA: Diagnosis not present

## 2021-03-09 DIAGNOSIS — K219 Gastro-esophageal reflux disease without esophagitis: Secondary | ICD-10-CM | POA: Diagnosis not present

## 2021-03-09 HISTORY — PX: ANTERIOR CERVICAL DECOMP/DISCECTOMY FUSION: SHX1161

## 2021-03-09 LAB — SARS CORONAVIRUS 2 BY RT PCR (HOSPITAL ORDER, PERFORMED IN ~~LOC~~ HOSPITAL LAB): SARS Coronavirus 2: NEGATIVE

## 2021-03-09 SURGERY — ANTERIOR CERVICAL DECOMPRESSION/DISCECTOMY FUSION 1 LEVEL
Anesthesia: General | Site: Neck

## 2021-03-09 MED ORDER — LIDOCAINE HCL (CARDIAC) PF 100 MG/5ML IV SOSY
PREFILLED_SYRINGE | INTRAVENOUS | Status: DC | PRN
  Administered 2021-03-09: 60 mg via INTRATRACHEAL

## 2021-03-09 MED ORDER — MIDAZOLAM HCL 2 MG/2ML IJ SOLN
INTRAMUSCULAR | Status: DC | PRN
  Administered 2021-03-09: 2 mg via INTRAVENOUS

## 2021-03-09 MED ORDER — ONDANSETRON HCL 4 MG/2ML IJ SOLN
INTRAMUSCULAR | Status: DC | PRN
  Administered 2021-03-09: 4 mg via INTRAVENOUS

## 2021-03-09 MED ORDER — CHLORHEXIDINE GLUCONATE 0.12 % MT SOLN
15.0000 mL | Freq: Once | OROMUCOSAL | Status: AC
  Administered 2021-03-09: 15 mL via OROMUCOSAL
  Filled 2021-03-09: qty 15

## 2021-03-09 MED ORDER — CEFAZOLIN SODIUM-DEXTROSE 2-4 GM/100ML-% IV SOLN
2.0000 g | INTRAVENOUS | Status: AC
  Administered 2021-03-09: 2 g via INTRAVENOUS
  Filled 2021-03-09: qty 100

## 2021-03-09 MED ORDER — CHLORHEXIDINE GLUCONATE CLOTH 2 % EX PADS: 6.0000 | MEDICATED_PAD | Freq: Once | CUTANEOUS | Status: DC

## 2021-03-09 MED ORDER — PROPOFOL 10 MG/ML IV BOLUS
INTRAVENOUS | Status: AC
  Filled 2021-03-09: qty 20

## 2021-03-09 MED ORDER — 0.9 % SODIUM CHLORIDE (POUR BTL) OPTIME
TOPICAL | Status: DC | PRN
  Administered 2021-03-09: 1000 mL

## 2021-03-09 MED ORDER — OXYCODONE HCL 5 MG/5ML PO SOLN: 5.0000 mg | Freq: Once | ORAL | Status: DC | PRN

## 2021-03-09 MED ORDER — HYDROMORPHONE HCL 1 MG/ML IJ SOLN
INTRAMUSCULAR | Status: AC
  Filled 2021-03-09: qty 1

## 2021-03-09 MED ORDER — SODIUM CHLORIDE 0.9% FLUSH
3.0000 mL | Freq: Two times a day (BID) | INTRAVENOUS | Status: DC
  Administered 2021-03-09: 3 mL via INTRAVENOUS

## 2021-03-09 MED ORDER — OXYCODONE HCL 5 MG PO TABS
5.0000 mg | ORAL_TABLET | Freq: Once | ORAL | Status: DC | PRN
Start: 2021-03-09 — End: 2021-03-09

## 2021-03-09 MED ORDER — ONDANSETRON HCL 4 MG/2ML IJ SOLN: 4.0000 mg | Freq: Once | INTRAMUSCULAR | Status: DC | PRN

## 2021-03-09 MED ORDER — CYCLOBENZAPRINE HCL 10 MG PO TABS
10.0000 mg | ORAL_TABLET | Freq: Three times a day (TID) | ORAL | Status: DC | PRN
  Administered 2021-03-09: 10 mg via ORAL
  Filled 2021-03-09: qty 1

## 2021-03-09 MED ORDER — CYCLOBENZAPRINE HCL 10 MG PO TABS: 10.0000 mg | ORAL_TABLET | Freq: Two times a day (BID) | ORAL | Status: DC | PRN

## 2021-03-09 MED ORDER — ACETAMINOPHEN 325 MG PO TABS: 650.0000 mg | ORAL_TABLET | ORAL | Status: DC | PRN

## 2021-03-09 MED ORDER — HYDROMORPHONE HCL 1 MG/ML IJ SOLN
0.2500 mg | INTRAMUSCULAR | Status: DC | PRN
  Administered 2021-03-09 (×4): 0.5 mg via INTRAVENOUS

## 2021-03-09 MED ORDER — SODIUM CHLORIDE 0.9 % IV SOLN: 250.0000 mL | INTRAVENOUS | Status: DC

## 2021-03-09 MED ORDER — AMISULPRIDE (ANTIEMETIC) 5 MG/2ML IV SOLN: 10.0000 mg | Freq: Once | INTRAVENOUS | Status: DC | PRN

## 2021-03-09 MED ORDER — MIDAZOLAM HCL 2 MG/2ML IJ SOLN
INTRAMUSCULAR | Status: AC
  Filled 2021-03-09: qty 2

## 2021-03-09 MED ORDER — ALBUTEROL SULFATE (2.5 MG/3ML) 0.083% IN NEBU: 3.0000 mL | INHALATION_SOLUTION | RESPIRATORY_TRACT | Status: DC | PRN

## 2021-03-09 MED ORDER — OXYCODONE HCL 5 MG PO TABS
15.0000 mg | ORAL_TABLET | Freq: Once | ORAL | Status: AC
  Administered 2021-03-09: 15 mg via ORAL
  Filled 2021-03-09: qty 3

## 2021-03-09 MED ORDER — HYDROMORPHONE HCL 1 MG/ML IJ SOLN: 0.5000 mg | INTRAMUSCULAR | Status: DC | PRN

## 2021-03-09 MED ORDER — ALBUTEROL SULFATE (2.5 MG/3ML) 0.083% IN NEBU: 2.5000 mg | INHALATION_SOLUTION | Freq: Four times a day (QID) | RESPIRATORY_TRACT | Status: DC | PRN

## 2021-03-09 MED ORDER — ALUM & MAG HYDROXIDE-SIMETH 200-200-20 MG/5ML PO SUSP: 30.0000 mL | Freq: Four times a day (QID) | ORAL | Status: DC | PRN

## 2021-03-09 MED ORDER — ACETAMINOPHEN 650 MG RE SUPP: 650.0000 mg | RECTAL | Status: DC | PRN

## 2021-03-09 MED ORDER — HEMOSTATIC AGENTS (NO CHARGE) OPTIME
TOPICAL | Status: DC | PRN
  Administered 2021-03-09: 1 via TOPICAL

## 2021-03-09 MED ORDER — ONDANSETRON HCL 4 MG PO TABS
4.0000 mg | ORAL_TABLET | Freq: Four times a day (QID) | ORAL | Status: DC | PRN
Start: 2021-03-09 — End: 2021-03-10

## 2021-03-09 MED ORDER — PANTOPRAZOLE SODIUM 40 MG IV SOLR
40.0000 mg | Freq: Every day | INTRAVENOUS | Status: DC
  Administered 2021-03-09: 40 mg via INTRAVENOUS
  Filled 2021-03-09: qty 40

## 2021-03-09 MED ORDER — LABETALOL HCL 5 MG/ML IV SOLN: 10.0000 mg | Freq: Once | INTRAVENOUS | Status: DC

## 2021-03-09 MED ORDER — OXYCODONE HCL 5 MG PO TABS
15.0000 mg | ORAL_TABLET | ORAL | Status: DC
Start: 2021-03-10 — End: 2021-03-10
  Administered 2021-03-10 (×3): 15 mg via ORAL
  Filled 2021-03-09 (×3): qty 3

## 2021-03-09 MED ORDER — LACTATED RINGERS IV SOLN: INTRAVENOUS | Status: DC

## 2021-03-09 MED ORDER — ACETAMINOPHEN 10 MG/ML IV SOLN
INTRAVENOUS | Status: AC
  Filled 2021-03-09: qty 100

## 2021-03-09 MED ORDER — ONDANSETRON HCL 4 MG/2ML IJ SOLN: 4.0000 mg | Freq: Four times a day (QID) | INTRAMUSCULAR | Status: DC | PRN

## 2021-03-09 MED ORDER — ORAL CARE MOUTH RINSE: 15.0000 mL | Freq: Once | OROMUCOSAL | Status: AC

## 2021-03-09 MED ORDER — THROMBIN 5000 UNITS EX SOLR
CUTANEOUS | Status: AC
  Filled 2021-03-09: qty 15000

## 2021-03-09 MED ORDER — PHENOL 1.4 % MT LIQD
1.0000 | OROMUCOSAL | Status: DC | PRN
  Administered 2021-03-09: 1 via OROMUCOSAL
  Filled 2021-03-09: qty 177

## 2021-03-09 MED ORDER — THROMBIN 5000 UNITS EX SOLR: OROMUCOSAL | Status: DC | PRN

## 2021-03-09 MED ORDER — OXYCODONE HCL 5 MG PO TABS
10.0000 mg | ORAL_TABLET | ORAL | Status: DC | PRN
  Administered 2021-03-09 – 2021-03-10 (×2): 10 mg via ORAL
  Filled 2021-03-09 (×2): qty 2

## 2021-03-09 MED ORDER — FLUTICASONE-UMECLIDIN-VILANT 100-62.5-25 MCG/INH IN AEPB: 1.0000 | INHALATION_SPRAY | Freq: Every day | RESPIRATORY_TRACT | Status: DC

## 2021-03-09 MED ORDER — FENTANYL CITRATE (PF) 250 MCG/5ML IJ SOLN
INTRAMUSCULAR | Status: DC | PRN
  Administered 2021-03-09: 100 ug via INTRAVENOUS
  Administered 2021-03-09: 50 ug via INTRAVENOUS
  Administered 2021-03-09: 100 ug via INTRAVENOUS

## 2021-03-09 MED ORDER — SODIUM CHLORIDE 0.9% FLUSH: 3.0000 mL | INTRAVENOUS | Status: DC | PRN

## 2021-03-09 MED ORDER — ROCURONIUM BROMIDE 100 MG/10ML IV SOLN
INTRAVENOUS | Status: DC | PRN
  Administered 2021-03-09: 60 mg via INTRAVENOUS

## 2021-03-09 MED ORDER — CEFAZOLIN SODIUM-DEXTROSE 2-4 GM/100ML-% IV SOLN
2.0000 g | Freq: Three times a day (TID) | INTRAVENOUS | Status: DC
Start: 2021-03-10 — End: 2021-03-10
  Administered 2021-03-10: 2 g via INTRAVENOUS
  Filled 2021-03-09 (×2): qty 100

## 2021-03-09 MED ORDER — FENTANYL CITRATE (PF) 250 MCG/5ML IJ SOLN
INTRAMUSCULAR | Status: AC
  Filled 2021-03-09: qty 5

## 2021-03-09 MED ORDER — SUGAMMADEX SODIUM 200 MG/2ML IV SOLN
INTRAVENOUS | Status: DC | PRN
  Administered 2021-03-09: 160 mg via INTRAVENOUS

## 2021-03-09 MED ORDER — ONDANSETRON 4 MG PO TBDP: 4.0000 mg | ORAL_TABLET | Freq: Two times a day (BID) | ORAL | Status: DC | PRN

## 2021-03-09 MED ORDER — DEXAMETHASONE SODIUM PHOSPHATE 10 MG/ML IJ SOLN
10.0000 mg | Freq: Once | INTRAMUSCULAR | Status: AC
  Administered 2021-03-09: 10 mg via INTRAVENOUS

## 2021-03-09 MED ORDER — MENTHOL 3 MG MT LOZG: 1.0000 | LOZENGE | OROMUCOSAL | Status: DC | PRN

## 2021-03-09 MED ORDER — PROPOFOL 10 MG/ML IV BOLUS
INTRAVENOUS | Status: DC | PRN
  Administered 2021-03-09: 160 mg via INTRAVENOUS

## 2021-03-09 MED ORDER — PHENYLEPHRINE 40 MCG/ML (10ML) SYRINGE FOR IV PUSH (FOR BLOOD PRESSURE SUPPORT)
PREFILLED_SYRINGE | INTRAVENOUS | Status: DC | PRN
  Administered 2021-03-09: 80 ug via INTRAVENOUS

## 2021-03-09 MED ORDER — ACETAMINOPHEN 10 MG/ML IV SOLN
1000.0000 mg | Freq: Once | INTRAVENOUS | Status: DC | PRN
  Administered 2021-03-09: 1000 mg via INTRAVENOUS

## 2021-03-09 MED ORDER — MAGNESIUM HYDROXIDE 400 MG/5ML PO SUSP: 15.0000 mL | Freq: Every day | ORAL | Status: DC | PRN

## 2021-03-09 SURGICAL SUPPLY — 34 items
BAG COUNTER SPONGE SURGICOUNT (BAG) ×2 IMPLANT
BENZOIN TINCTURE PRP APPL 2/3 (GAUZE/BANDAGES/DRESSINGS) ×2 IMPLANT
CANISTER SUCT 3000ML PPV (MISCELLANEOUS) ×2 IMPLANT
CLOSURE STERI-STRIP 1/4X4 (GAUZE/BANDAGES/DRESSINGS) ×2 IMPLANT
DERMABOND ADVANCED (GAUZE/BANDAGES/DRESSINGS) ×1
DERMABOND ADVANCED .7 DNX12 (GAUZE/BANDAGES/DRESSINGS) ×1 IMPLANT
DRAIN JACKSON PRT FLT 7MM (DRAIN) ×2 IMPLANT
DRAPE LAPAROTOMY 100X72 PEDS (DRAPES) ×2 IMPLANT
DRSG OPSITE POSTOP 4X8 (GAUZE/BANDAGES/DRESSINGS) ×2 IMPLANT
DURAPREP 6ML APPLICATOR 50/CS (WOUND CARE) ×2 IMPLANT
ELECT COATED BLADE 2.86 ST (ELECTRODE) ×2 IMPLANT
ELECT REM PT RETURN 9FT ADLT (ELECTROSURGICAL) ×2
ELECTRODE REM PT RTRN 9FT ADLT (ELECTROSURGICAL) ×1 IMPLANT
EVACUATOR SILICONE 100CC (DRAIN) ×2 IMPLANT
GAUZE 4X4 16PLY ~~LOC~~+RFID DBL (SPONGE) ×2 IMPLANT
GAUZE SPONGE 4X4 12PLY STRL (GAUZE/BANDAGES/DRESSINGS) IMPLANT
GLOVE SURG ENC MOIS LTX SZ8 (GLOVE) ×2 IMPLANT
GLOVE SURG UNDER LTX SZ8.5 (GLOVE) ×2 IMPLANT
GOWN STRL REUS W/TWL 2XL LVL3 (GOWN DISPOSABLE) ×2 IMPLANT
HALTER HD/CHIN CERV TRACTION D (MISCELLANEOUS) ×2 IMPLANT
KIT BASIN OR (CUSTOM PROCEDURE TRAY) ×2 IMPLANT
KIT TURNOVER KIT B (KITS) ×2 IMPLANT
NS IRRIG 1000ML POUR BTL (IV SOLUTION) ×2 IMPLANT
PACK LAMINECTOMY NEURO (CUSTOM PROCEDURE TRAY) ×2 IMPLANT
PAD ARMBOARD 7.5X6 YLW CONV (MISCELLANEOUS) ×4 IMPLANT
SLEEVE SCD COMPRESS KNEE MED (STOCKING) ×2 IMPLANT
SPONGE INTESTINAL PEANUT (DISPOSABLE) ×4 IMPLANT
SPONGE SURGIFOAM ABS GEL SZ50 (HEMOSTASIS) ×2 IMPLANT
STRIP CLOSURE SKIN 1/2X4 (GAUZE/BANDAGES/DRESSINGS) ×2 IMPLANT
SUT VIC AB 3-0 SH 8-18 (SUTURE) ×2 IMPLANT
SUT VIC AB 4-0 PS2 27 (SUTURE) ×2 IMPLANT
SUT VICRYL 4-0 PS2 18IN ABS (SUTURE) IMPLANT
TOWEL GREEN STERILE FF (TOWEL DISPOSABLE) ×2 IMPLANT
WATER STERILE IRR 1000ML POUR (IV SOLUTION) ×2 IMPLANT

## 2021-03-09 NOTE — Anesthesia Procedure Notes (Signed)
Procedure Name: Intubation Date/Time: 03/09/2021 6:30 PM Performed by: Adair Laundry, CRNA Pre-anesthesia Checklist: Patient identified, Emergency Drugs available, Suction available and Patient being monitored Patient Re-evaluated:Patient Re-evaluated prior to induction Oxygen Delivery Method: Circle system utilized Preoxygenation: Pre-oxygenation with 100% oxygen Induction Type: IV induction Ventilation: Mask ventilation without difficulty Laryngoscope Size: Miller and 2 Grade View: Grade I Tube type: Oral Tube size: 7.0 mm Number of attempts: 1 Airway Equipment and Method: Stylet Secured at: 22 cm Tube secured with: Tape Dental Injury: Teeth and Oropharynx as per pre-operative assessment

## 2021-03-09 NOTE — Op Note (Signed)
Preoperative diagnosis: Anterior cervical wound hematoma  Postoperative diagnosis: Same  Procedure: Reexploration of anterior cervical wound for evacuation of hematoma.  Surgeon: Jillyn Hidden Dreonna Hussein  Anesthesia: General  EBL: Minimal  HPI: 60 year old gentleman underwent anterior cervical discectomy and fusion about 2 weeks ago postoperative elbow severe dysphagia work-up revealed fluid collection in the prevertebral space that we attempted to treat this with steroids however patient did not improve he actually started to progress so we recommended reexploration of his anterior cervical wound for evacuation of hematoma.  I extensively reviewed the risks and benefits of the procedure with the patient as well as perioperative course expectations of outcome and alternatives of surgery and he understood and agreed to proceed forward.  Operative procedure: Patient was brought in the OR was [place] general anesthesia was positioned supine the neck in slight extension 5 pounds halter traction.  The right side was next prepped and draped in routine sterile fashion.  His old incision was opened up the scar tissue was dissected free and there was an extensive mount of adhesions already formed only after 2 weeks.  I did develop the avascular plane between between the strap muscles and the sternocleidomastoid and identified the carotid and worked the plane on the medial aspect the carotid freeing up the scar tissue to free the esophagus and dissect down the plane.  There was an extensive mount of swelling and scar tissue in the prevertebral space and I was is working to try to develop the plane anterior that prevertebral scar and immediately got a large amount of old hematoma under some pressure.  There was also extensive mount of inflammatory response around the longus on the patient's left side as well initially was stopped identified with this was organized hematoma or just a reaction in the longus I did not pick up any  discrete organized hematoma mostly just the inflammatory and swollen the longus coli.  So dissected on the parade completely reexplored and reexposed the plate and hardware evacuate all hematoma coagulated the longus and any additional bleeders placed a JP drain and closed the wound in layers with interrupted Vicryl and a running 4 subcuticular Dermabond benzoin Steri-Strips and a sterile dressing was applied patient recovery in stable condition.  At the end the case all needle counts and sponge counts were correct.

## 2021-03-09 NOTE — H&P (Signed)
[name] Carter is an 60 y.o. male.   Chief Complaint: Difficulty swallowing HPI: 60 year old gentleman 3 weeks out from an ACDF who presented to the emergency room last week with difficulty swallowing CT scan showed a moderate sized fluid collection he was not having a difficult day with his breathing.  We sent him home on a steroid pack he remained stable but not improved.  Present on follow-up in clinic yesterday with persistent dysphagia difficulty talking and swallowing.  So we recommended reexploration of his anterior cervical wound for evacuation of hematoma.  I extensively went over the risks and benefits of that procedure with him as well as perioperative course expectations of outcome and alternatives of surgery and he understands and agrees to proceed forward.  Past Medical History:  Diagnosis Date   Asthma    Chronic back pain    COPD (chronic obstructive pulmonary disease) (HCC)    DDD (degenerative disc disease), cervical    DDD (degenerative disc disease), lumbar    HTN (hypertension)    Neck pain    Pneumonia     Past Surgical History:  Procedure Laterality Date   ANTERIOR CERVICAL DECOMP/DISCECTOMY FUSION N/A 02/23/2021   Procedure: ANTERIOR CERVICAL DECOMPRESSION FUSION  - CERVICAL FOUR-CERVICAL FIVE - CERVICAL FIVE-CERVICAL SIX;  Surgeon: Kary Kos, MD;  Location: [place];  Service: Neurosurgery;  Laterality: N/A;   BACK SURGERY     low back x4   BILATERAL CARPAL TUNNEL RELEASE     bilteral knee surgery     COLONOSCOPY  07/03/2012   [place] MD: MAC. Random colon biopsies benign. Rectal polyp tubular adenoma.   ESOPHAGOGASTRODUODENOSCOPY  07/03/2012   [place] MD: MAC. Chronic gastritis and intestinal metaplasia, ?metaplastic atrophic gastritis, negative H.pylori, no celiac, duidnal mucosa with mild Brunner gland hyperplasia, nonspecific.    LEFT HEART CATH AND CORONARY ANGIOGRAPHY N/A 11/07/2017   Procedure: LEFT HEART CATH AND CORONARY ANGIOGRAPHY;  Surgeon: Belva Crome, MD;  Location: [facility];  Service: Cardiovascular;  Laterality: N/A;   TONSILLECTOMY     removed at age 85   [place]     ? by description, not clear    Family History  Problem Relation Age of Onset   Heart attack Father    Colon cancer Father        diagnosed in his 75s   Heart attack Mother    Social History:  reports that he has been smoking cigarettes. He started smoking about 45 years ago. He has a 41.00 pack-year smoking history. He has never used smokeless tobacco. He reports that he does not drink alcohol and does not use drugs.  Allergies:  Allergies  Allergen Reactions   Vicodin [Hydrocodone-Acetaminophen] Nausea And Vomiting and Other (See Comments)    Made patient feel funny, stomach pain    Medications Prior to Admission  Medication Sig Dispense Refill   albuterol (PROVENTIL HFA;VENTOLIN HFA) 108 (90 BASE) MCG/ACT inhaler Inhale 2 puffs into the lungs every 4 (four) hours as needed for wheezing or shortness of breath. 1 Inhaler 1   albuterol (PROVENTIL) (2.5 MG/3ML) 0.083% nebulizer solution Take 3 mLs (2.5 mg total) by nebulization every 6 (six) hours as needed for wheezing or shortness of breath. 75 mL 12   cyclobenzaprine (FLEXERIL) 10 MG tablet Take 1 tablet (10 mg total) by mouth 2 (two) times daily as needed for muscle spasms. 20 tablet 0   oxyCODONE (ROXICODONE) 15 MG immediate release tablet Take 15 mg by mouth every  4 (four) hours.     Fluticasone-Umeclidin-Vilant (TRELEGY ELLIPTA) 100-62.5-25 MCG/INH AEPB Inhale 1 puff into the lungs daily. 28 each 1   magnesium hydroxide (MILK OF MAGNESIA) 400 MG/5ML suspension Take 15 mLs by mouth daily as needed for mild constipation.     ondansetron (ZOFRAN-ODT) 4 MG disintegrating tablet Take 4 mg by mouth 2 (two) times daily as needed for nausea/vomiting.      Results for orders placed or performed during the hospital encounter of 03/09/21 (from the past 48 hour(s))  SARS Coronavirus 2  by RT PCR (hospital order, performed in [facility] hospital lab) Nasopharyngeal Nasopharyngeal Swab     Status: None   Collection Time: 03/09/21  1:32 PM   Specimen: Nasopharyngeal Swab  Result Value Ref Range   SARS Coronavirus 2 NEGATIVE NEGATIVE    Comment: (NOTE) SARS-CoV-2 target nucleic acids are NOT DETECTED.  The SARS-CoV-2 RNA is generally detectable in upper and lower respiratory specimens during the acute phase of infection. The lowest concentration of SARS-CoV-2 viral copies this assay can detect is 250 copies / mL. A negative result does not preclude SARS-CoV-2 infection and should not be used as the sole basis for treatment or other patient management decisions.  A negative result may occur with improper specimen collection / handling, submission of specimen other than nasopharyngeal swab, presence of viral mutation(s) within the areas targeted by this assay, and inadequate number of viral copies (<250 copies / mL). A negative result must be combined with clinical observations, patient history, and epidemiological information.  Fact Sheet for Patients:   StrictlyIdeas.no  Fact Sheet for Healthcare Providers: BankingDealers.co.za  This test is not yet approved or  cleared by the [place] FDA and has been authorized for detection and/or diagnosis of SARS-CoV-2 by FDA under an Emergency Use Authorization (EUA).  This EUA will remain in effect (meaning this test can be used) for the duration of the COVID-19 declaration under Section 564(b)(1) of the Act, 21 U.S.C. section 360bbb-3(b)(1), unless the authorization is terminated or revoked sooner.  Performed at [facility], [place] [address] [place], [place] [address]    No results found.  Review of Systems  HENT:  Positive for sore throat and trouble swallowing.   Musculoskeletal:  Positive for neck pain.  Neurological:  Positive for numbness.   Blood  pressure (!) 159/95, pulse 79, temperature 98.6 F (37 C), temperature source Oral, resp. rate 20, height _0  (1.753 m), weight 74.4 kg, SpO2 97 %. Physical Exam HENT:     Head: Normocephalic.     Right Ear: Tympanic membrane normal.     Nose: Nose normal.     Mouth/Throat:     Mouth: Mucous membranes are moist.  Eyes:     Pupils: Pupils are equal, round, and reactive to light.  Pulmonary:     Effort: Pulmonary effort is normal.  Abdominal:     General: Abdomen is flat.  Musculoskeletal:        General: Normal range of motion.  Neurological:     Mental Status: He is alert.     Comments: Awake and alert strength is 5/5 deltoid, bicep, tricep, wrist flexion, wrist extension, hand intrinsics.  Incisions clean dry and intact voice is hoarse     Assessment/Plan 60 year old presents for reexploration of anterior cervical wound for evacuation of hematoma  Elaina Hoops, MD 03/09/2021, 5:10 PM

## 2021-03-09 NOTE — Anesthesia Preprocedure Evaluation (Addendum)
Anesthesia Evaluation  Patient identified by MRN, date of birth, ID band Patient awake    Reviewed: Allergy & Precautions, NPO status , Patient's Chart, lab work & pertinent test results  History of Anesthesia Complications Negative for: history of anesthetic complications  Airway Mallampati: I  TM Distance: >3 FB Neck ROM: Full    Dental  (+) Edentulous Upper, Missing, Chipped, Dental Advisory Given, Partial Upper   Pulmonary COPD,  COPD inhaler, Current Smoker and Patient abstained from smoking.,  02/21/2021 SARS coronavirus neg   + rhonchi        Cardiovascular hypertension, Pt. on medications  Rhythm:Regular Rate:Normal  '20 ECHO: EF 55-60%, normal LVF, grade 1 DD, no significant valvular abnormalities '19 Cath: no significant coronary disease   Neuro/Psych Chronic back pain: opioids negative neurological ROS  negative psych ROS   GI/Hepatic GERD  Medicated and Controlled,(+)     substance abuse (for back pain)  ,   Endo/Other  negative endocrine ROS  Renal/GU negative Renal ROS     Musculoskeletal  (+) Arthritis , narcotic dependent  Abdominal   Peds  Hematology negative hematology ROS (+) Lab Results      Component                Value               Date                      WBC                      10.8 (H)            02/28/2021                HGB                      15.8                02/28/2021                HCT                      48.1                02/28/2021                MCV                      92.3                02/28/2021                PLT                      240                 02/28/2021              Anesthesia Other Findings   Reproductive/Obstetrics                             Anesthesia Physical Anesthesia Plan  ASA: 3  Anesthesia Plan: General   Post-op Pain Management:    Induction: Intravenous  PONV Risk Score and Plan: 3 and Treatment may  vary due to age or medical condition, Midazolam, Dexamethasone and Ondansetron  Airway  Management Planned: Video Laryngoscope Planned and Oral ETT  Additional Equipment: None  Intra-op Plan:   Post-operative Plan: Extubation in OR  Informed Consent: I have reviewed the patients History and Physical, chart, labs and discussed the procedure including the risks, benefits and alternatives for the proposed anesthesia with the patient or authorized representative who has indicated his/her understanding and acceptance.     Dental advisory given  Plan Discussed with:   Anesthesia Plan Comments:         Anesthesia Quick Evaluation

## 2021-03-09 NOTE — Transfer of Care (Signed)
Immediate Anesthesia Transfer of Care Note  Patient: [name] Carter  Procedure(s) Performed: Re-exploration of anterior cervical fusion for evacuation of hematoma (Neck)  Patient Location: PACU  Anesthesia Type:General  Level of Consciousness: awake and oriented  Airway & Oxygen Therapy: Patient Spontanous Breathing and Patient connected to nasal cannula oxygen  Post-op Assessment: Report given to RN, Post -op Vital signs reviewed and stable and Patient moving all extremities X 4  Post vital signs: Reviewed and stable  Last Vitals:  Vitals Value Taken Time  BP 168/107 03/09/21 1951  Temp    Pulse 81 03/09/21 1952  Resp 20 03/09/21 1952  SpO2 96 % 03/09/21 1952  Vitals shown include unvalidated device data.  Last Pain:  Vitals:   03/09/21 1404  TempSrc:   PainSc: 10-Worst pain ever      Patients Stated Pain Goal: 0 (03/09/21 1404)  Complications: No notable events documented.

## 2021-03-10 ENCOUNTER — Encounter (HOSPITAL_COMMUNITY): Payer: Self-pay | Admitting: Neurosurgery

## 2021-03-10 MED FILL — Thrombin For Soln 5000 Unit: CUTANEOUS | Qty: 2 | Status: AC

## 2021-03-10 NOTE — Plan of Care (Signed)
  Problem: Education: Goal: Knowledge of General Education information will improve Description: Including pain rating scale, medication(s)/side effects and non-pharmacologic comfort measures Outcome: Progressing   Problem: Health Behavior/Discharge Planning: Goal: Ability to manage health-related needs will improve Outcome: Progressing   Problem: Activity: Goal: Risk for activity intolerance will decrease Outcome: Progressing   Problem: Coping: Goal: Level of anxiety will decrease Outcome: Progressing   Problem: Nutrition: Goal: Adequate nutrition will be maintained Outcome: Progressing

## 2021-03-10 NOTE — Discharge Summary (Signed)
Physician Discharge Summary  Patient ID: [name] Carter MRN: [id number] DOB/AGE: 1960/11/04 60 y.o. Estimated body mass index is 24.22 kg/m as calculated from the following:   Height as of this encounter: 5\' 9"  (1.753 m).   Weight as of this encounter: 74.4 kg.   Admit date: 03/09/2021 Discharge date: 03/10/2021  Admission Diagnoses:dysphagia  Discharge Diagnoses: same Active Problems:   Dysphagia   Discharged Condition: good  Hospital Course: Patient was admitted and unserwent reexplorationof anterior cervical wound for evacuating of hematoma.  Post operatively he did well was ambulating and voiding and tolerating a regular diet and stable for discharge home  Consults: Significant Diagnostic Studies: Treatments:rexploration of anterior cervical wound Discharge Exam: Blood pressure (!) 136/104, pulse (!) 51, temperature 97.8 F (36.6 C), temperature source Oral, resp. rate 14, height 5\' 9"  (1.753 m), weight 74.4 kg, SpO2 95 %. Incision C/I/D  drain 15cc output  Disposition: home   Allergies as of 03/10/2021       Reactions   Vicodin [hydrocodone-acetaminophen] Nausea And Vomiting, Other (See Comments)   Made patient feel funny, stomach pain        Medication List     TAKE these medications    albuterol 108 (90 Base) MCG/ACT inhaler Commonly known as: VENTOLIN HFA Inhale 2 puffs into the lungs every 4 (four) hours as needed for wheezing or shortness of breath.   albuterol (2.5 MG/3ML) 0.083% nebulizer solution Commonly known as: PROVENTIL Take 3 mLs (2.5 mg total) by nebulization every 6 (six) hours as needed for wheezing or shortness of breath.   cyclobenzaprine 10 MG tablet Commonly known as: FLEXERIL Take 1 tablet (10 mg total) by mouth 2 (two) times daily as needed for muscle spasms.   magnesium hydroxide 400 MG/5ML suspension Commonly known as: MILK OF MAGNESIA Take 15 mLs by mouth daily as needed for mild constipation.   ondansetron 4 MG disintegrating  tablet Commonly known as: ZOFRAN-ODT Take 4 mg by mouth 2 (two) times daily as needed for nausea/vomiting.   oxyCODONE 15 MG immediate release tablet Commonly known as: ROXICODONE Take 15 mg by mouth every 4 (four) hours.   Trelegy Ellipta 100-62.5-25 MCG/INH Aepb Generic drug: Fluticasone-Umeclidin-Vilant Inhale 1 puff into the lungs daily.         Signed: 05/10/2021 03/10/2021, 8:03 AM

## 2021-03-10 NOTE — Anesthesia Postprocedure Evaluation (Signed)
Anesthesia Post Note  Patient: Mansur Patti  Procedure(s) Performed: Re-exploration of anterior cervical fusion for evacuation of hematoma (Neck)     Patient location during evaluation: PACU Anesthesia Type: General Level of consciousness: awake and alert Pain management: pain level controlled Vital Signs Assessment: post-procedure vital signs reviewed and stable Respiratory status: spontaneous breathing, nonlabored ventilation, respiratory function stable and patient connected to nasal cannula oxygen Cardiovascular status: blood pressure returned to baseline and stable Postop Assessment: no apparent nausea or vomiting Anesthetic complications: no   No notable events documented.  Last Vitals:  Vitals:   03/10/21 0036 03/10/21 0428  BP: (!) 144/92 121/90  Pulse: 72 62  Resp: 16 16  Temp: 36.8 C 36.5 C  SpO2: 93% 95%    Last Pain:  Vitals:   03/10/21 0430  TempSrc:   PainSc: 7                  Aya Geisel S

## 2021-03-15 DIAGNOSIS — M47812 Spondylosis without myelopathy or radiculopathy, cervical region: Secondary | ICD-10-CM | POA: Diagnosis not present

## 2021-03-15 DIAGNOSIS — Z79891 Long term (current) use of opiate analgesic: Secondary | ICD-10-CM | POA: Diagnosis not present

## 2021-03-15 DIAGNOSIS — R030 Elevated blood-pressure reading, without diagnosis of hypertension: Secondary | ICD-10-CM | POA: Diagnosis not present

## 2021-03-15 DIAGNOSIS — Z6826 Body mass index (BMI) 26.0-26.9, adult: Secondary | ICD-10-CM | POA: Diagnosis not present

## 2021-03-15 DIAGNOSIS — Z79899 Other long term (current) drug therapy: Secondary | ICD-10-CM | POA: Diagnosis not present

## 2021-03-29 DIAGNOSIS — J449 Chronic obstructive pulmonary disease, unspecified: Secondary | ICD-10-CM | POA: Diagnosis not present

## 2021-03-29 DIAGNOSIS — R768 Other specified abnormal immunological findings in serum: Secondary | ICD-10-CM | POA: Diagnosis not present

## 2021-03-29 DIAGNOSIS — R110 Nausea: Secondary | ICD-10-CM | POA: Diagnosis not present

## 2021-03-29 DIAGNOSIS — F5221 Male erectile disorder: Secondary | ICD-10-CM | POA: Diagnosis not present

## 2021-03-29 DIAGNOSIS — Z23 Encounter for immunization: Secondary | ICD-10-CM | POA: Diagnosis not present

## 2021-04-07 ENCOUNTER — Other Ambulatory Visit: Payer: Self-pay

## 2021-04-07 ENCOUNTER — Ambulatory Visit (HOSPITAL_COMMUNITY)
Admission: RE | Admit: 2021-04-07 | Discharge: 2021-04-07 | Disposition: A | Discharge status: Home / Self Care | Payer: PPO | Source: Ambulatory Visit | Attending: Physician Assistant | Admitting: Physician Assistant

## 2021-04-07 DIAGNOSIS — Z87891 Personal history of nicotine dependence: Secondary | ICD-10-CM | POA: Diagnosis not present

## 2021-04-07 DIAGNOSIS — Z122 Encounter for screening for malignant neoplasm of respiratory organs: Secondary | ICD-10-CM | POA: Diagnosis not present

## 2021-04-07 DIAGNOSIS — F17210 Nicotine dependence, cigarettes, uncomplicated: Secondary | ICD-10-CM | POA: Diagnosis not present

## 2021-04-12 DIAGNOSIS — Z79891 Long term (current) use of opiate analgesic: Secondary | ICD-10-CM | POA: Diagnosis not present

## 2021-04-12 DIAGNOSIS — J449 Chronic obstructive pulmonary disease, unspecified: Secondary | ICD-10-CM | POA: Diagnosis not present

## 2021-04-12 DIAGNOSIS — R030 Elevated blood-pressure reading, without diagnosis of hypertension: Secondary | ICD-10-CM | POA: Diagnosis not present

## 2021-04-12 DIAGNOSIS — Z79899 Other long term (current) drug therapy: Secondary | ICD-10-CM | POA: Diagnosis not present

## 2021-04-12 DIAGNOSIS — M545 Low back pain, unspecified: Secondary | ICD-10-CM | POA: Diagnosis not present

## 2021-04-12 DIAGNOSIS — Z6826 Body mass index (BMI) 26.0-26.9, adult: Secondary | ICD-10-CM | POA: Diagnosis not present

## 2021-04-12 DIAGNOSIS — R0602 Shortness of breath: Secondary | ICD-10-CM | POA: Diagnosis not present

## 2021-04-12 DIAGNOSIS — M47812 Spondylosis without myelopathy or radiculopathy, cervical region: Secondary | ICD-10-CM | POA: Diagnosis not present

## 2021-04-13 ENCOUNTER — Encounter (HOSPITAL_COMMUNITY): Payer: Self-pay

## 2021-04-13 NOTE — Progress Notes (Signed)
Patient notified of LDCT Lung Cancer Screening Results via mail with the recommendation to follow-up in 12 months. Patient's referring provider has been sent a copy of results. Results are as follows:   IMPRESSION: Lung-RADS 2, benign appearance or behavior. Continue annual screening with low-dose chest CT without contrast in 12 months.   Three-vessel coronary artery calcifications.   Aortic Atherosclerosis (ICD10-I70.0) and Emphysema (ICD10-J43.9).

## 2021-04-14 DIAGNOSIS — Z79899 Other long term (current) drug therapy: Secondary | ICD-10-CM | POA: Diagnosis not present

## 2021-04-27 ENCOUNTER — Ambulatory Visit: Payer: PPO | Admitting: Cardiology

## 2021-05-11 DIAGNOSIS — Z79899 Other long term (current) drug therapy: Secondary | ICD-10-CM | POA: Diagnosis not present

## 2021-05-11 DIAGNOSIS — M47812 Spondylosis without myelopathy or radiculopathy, cervical region: Secondary | ICD-10-CM | POA: Diagnosis not present

## 2021-05-11 DIAGNOSIS — J449 Chronic obstructive pulmonary disease, unspecified: Secondary | ICD-10-CM | POA: Diagnosis not present

## 2021-05-11 DIAGNOSIS — M545 Low back pain, unspecified: Secondary | ICD-10-CM | POA: Diagnosis not present

## 2021-05-13 DIAGNOSIS — Z79899 Other long term (current) drug therapy: Secondary | ICD-10-CM | POA: Diagnosis not present

## 2021-06-09 DIAGNOSIS — M5412 Radiculopathy, cervical region: Secondary | ICD-10-CM | POA: Diagnosis not present

## 2021-06-15 DIAGNOSIS — J449 Chronic obstructive pulmonary disease, unspecified: Secondary | ICD-10-CM | POA: Diagnosis not present

## 2021-06-15 DIAGNOSIS — E7800 Pure hypercholesterolemia, unspecified: Secondary | ICD-10-CM | POA: Diagnosis not present

## 2021-06-15 DIAGNOSIS — E559 Vitamin D deficiency, unspecified: Secondary | ICD-10-CM | POA: Diagnosis not present

## 2021-06-15 DIAGNOSIS — R030 Elevated blood-pressure reading, without diagnosis of hypertension: Secondary | ICD-10-CM | POA: Diagnosis not present

## 2021-06-15 DIAGNOSIS — Z6827 Body mass index (BMI) 27.0-27.9, adult: Secondary | ICD-10-CM | POA: Diagnosis not present

## 2021-07-15 ENCOUNTER — Ambulatory Visit: Payer: PPO | Admitting: Cardiology

## 2021-09-23 ENCOUNTER — Ambulatory Visit: Payer: PPO | Admitting: Cardiology

## 2021-09-23 NOTE — Progress Notes (Deleted)
? ? ? ?Clinical Summary ?[name] Carter is a 61 y.o.male ? ?seen today for follow up of the following medical problems ?  ?  ?1. Chest pain ?- 08/2017 echo LVEF 60-5%, no WMAs ?- 08/2017 CT chest with aortic atherosclerosis, left main CAD.  ?  ?- 2 months of chest pain. Pressure mid chest, sharp pain left arm. Numbness in face.  ?- 7/10 in severity. Mainly occurs with activity. +SOB +fatigue ?- pain lasts up to 1 hr, numbness can last hours at a time ?  ?CAD risk factors: +tobacco, father 82 MI, sister with heart troubles ?- cannot run on treadmill.  ?  ?  ?10/2017 coronary CTA ?IMPRESSION: ?1. CT FFR analysis showed significant stenosis in the mid to distal ?LAD, a cardiac catheterization is recommended. ?  ?10/2017 cath ?LM prox 20-30%, LAD 30%, D1 60%, normal LCX. RCA fistula ?  ?-seen 05/2019 with chest pain thought to be pleuritic from recent admission with pneumonia ? 05/2019 echo LVEF 41-58%, grade I diasotlic dysfunction ?  ?- still chest pains at times. Ongoing for 3 months. Had pneumonia recently in [place], ongoing symptoms for last few months. Diagnosied with pleurisy ?- midchest Sharp/stabbing pain, worst with exhaling. Typically occurs with stress. Can be constant for several ours, can last entire day.  ?- +cough, +wheezing. Compliant with inhalers.  ?  ?  ?2. COPD ?- previously followed by Dr Luan Pulling, has not established with new provider as of yet ?  ?3. Hyperlipidemia ?- was to start crestor 71m daily ?  ?  ?  ?Past Medical History:  ?Diagnosis Date  ? Asthma   ? Chronic back pain   ? COPD (chronic obstructive pulmonary disease) ([place]   ? DDD (degenerative disc disease), cervical   ? DDD (degenerative disc disease), lumbar   ? HTN (hypertension)   ? Neck pain   ? Pneumonia   ? ? ? ?Allergies  ?Allergen Reactions  ? Vicodin [Hydrocodone-Acetaminophen] Nausea And Vomiting and Other (See Comments)  ?  Made patient feel funny, stomach pain  ? ? ? ?Current Outpatient Medications  ?Medication Sig Dispense  Refill  ? albuterol (PROVENTIL HFA;VENTOLIN HFA) 108 (90 BASE) MCG/ACT inhaler Inhale 2 puffs into the lungs every 4 (four) hours as needed for wheezing or shortness of breath. 1 Inhaler 1  ? albuterol (PROVENTIL) (2.5 MG/3ML) 0.083% nebulizer solution Take 3 mLs (2.5 mg total) by nebulization every 6 (six) hours as needed for wheezing or shortness of breath. 75 mL 12  ? cyclobenzaprine (FLEXERIL) 10 MG tablet Take 1 tablet (10 mg total) by mouth 2 (two) times daily as needed for muscle spasms. 20 tablet 0  ? Fluticasone-Umeclidin-Vilant (TRELEGY ELLIPTA) 100-62.5-25 MCG/INH AEPB Inhale 1 puff into the lungs daily. 28 each 1  ? magnesium hydroxide (MILK OF MAGNESIA) 400 MG/5ML suspension Take 15 mLs by mouth daily as needed for mild constipation.    ? ondansetron (ZOFRAN-ODT) 4 MG disintegrating tablet Take 4 mg by mouth 2 (two) times daily as needed for nausea/vomiting.    ? oxyCODONE (ROXICODONE) 15 MG immediate release tablet Take 15 mg by mouth every 4 (four) hours.    ? ?No current facility-administered medications for this visit.  ? ? ? ?Past Surgical History:  ?Procedure Laterality Date  ? ANTERIOR CERVICAL DECOMP/DISCECTOMY FUSION N/A 02/23/2021  ? Procedure: ANTERIOR CERVICAL DECOMPRESSION FUSION  - CERVICAL FOUR-CERVICAL FIVE - CERVICAL FIVE-CERVICAL SIX;  Surgeon: CKary Kos MD;  Location[place] [facility]  Service: Neurosurgery;  Laterality: N/A;  ?  ANTERIOR CERVICAL DECOMP/DISCECTOMY FUSION N/A 03/09/2021  ? Procedure: Re-exploration of anterior cervical fusion for evacuation of hematoma;  Surgeon: Kary Kos, MD;  Location: [place];  Service: Neurosurgery;  Laterality: N/A;  ? BACK SURGERY    ? low back x4  ? BILATERAL CARPAL TUNNEL RELEASE    ? bilteral knee surgery    ? COLONOSCOPY  07/03/2012  ? [place] MD: MAC. Random colon biopsies benign. Rectal polyp tubular adenoma.  ? ESOPHAGOGASTRODUODENOSCOPY  07/03/2012  ? [place] MD: MAC. Chronic gastritis and intestinal metaplasia, ?metaplastic atrophic gastritis,  negative H.pylori, no celiac, duidnal mucosa with mild Brunner gland hyperplasia, nonspecific.   ? LEFT HEART CATH AND CORONARY ANGIOGRAPHY N/A 11/07/2017  ? Procedure: LEFT HEART CATH AND CORONARY ANGIOGRAPHY;  Surgeon: Belva Crome, MD;  Location: [facility];  Service: Cardiovascular;  Laterality: N/A;  ? TONSILLECTOMY    ? removed at age 50  ? ULNAR NERVE TRANSPOSITION    ? ? by description, not clear  ? ? ? ?Allergies  ?Allergen Reactions  ? Vicodin [Hydrocodone-Acetaminophen] Nausea And Vomiting and Other (See Comments)  ?  Made patient feel funny, stomach pain  ? ? ? ? ?Family History  ?Problem Relation Age of Onset  ? Heart attack Father   ? Colon cancer Father   ?     diagnosed in his 83s  ? Heart attack Mother   ? ? ? ?Social History ?[name] Carter reports that he has been smoking cigarettes. He started smoking about 46 years ago. He has a 41.00 pack-year smoking history. He has never used smokeless tobacco. ?[name] Carter reports no history of alcohol use. ? ? ?Review of Systems ?CONSTITUTIONAL: No weight loss, fever, chills, weakness or fatigue.  ?HEENT: Eyes: No visual loss, blurred vision, double vision or yellow sclerae.No hearing loss, sneezing, congestion, runny nose or sore throat.  ?SKIN: No rash or itching.  ?CARDIOVASCULAR:  ?RESPIRATORY: No shortness of breath, cough or sputum.  ?GASTROINTESTINAL: No anorexia, nausea, vomiting or diarrhea. No abdominal pain or blood.  ?GENITOURINARY: No burning on urination, no polyuria ?NEUROLOGICAL: No headache, dizziness, syncope, paralysis, ataxia, numbness or tingling in the extremities. No change in bowel or bladder control.  ?MUSCULOSKELETAL: No muscle, back pain, joint pain or stiffness.  ?LYMPHATICS: No enlarged nodes. No history of splenectomy.  ?PSYCHIATRIC: No history of depression or anxiety.  ?ENDOCRINOLOGIC: No reports of sweating, cold or heat intolerance. No polyuria or polydipsia.  ?. ? ? ?Physical Examination ?There were no vitals  filed for this visit. ?There were no vitals filed for this visit. ? ?Gen: resting comfortably, no acute distress ?HEENT: no scleral icterus, pupils equal round and reactive, no palptable cervical adenopathy,  ?CV ?Resp: Clear to auscultation bilaterally ?GI: abdomen is soft, non-tender, non-distended, normal bowel sounds, no hepatosplenomegaly ?MSK: extremities are warm, no edema.  ?Skin: warm, no rash ?Neuro:  no focal deficits ?Psych: appropriate affect ? ? ?Diagnostic Studies ?08/2017 echo ?Study Conclusions ?  ?- Left ventricle: The cavity size was normal. Wall thickness was ?  normal. Systolic function was normal. The estimated ejection ?  fraction was in the range of 60% to 65%. Left ventricular ?  diastolic function parameters were normal. ?  ?  ?10/2017 coronary CTA ?1. Left Main:  No significant stenosis. ?  ?2. LAD: Proximal: 0.93, mid: 0.88, distal: 0.78. ?3. D1: 0.88. ?4. LCX: Proximal LCX: 0.94, distal LCX: 0.79. ?5. RCA: No significant stenosis. ?  ?IMPRESSION: ?1. CT FFR analysis showed significant stenosis in  the mid to distal ?LAD, a cardiac catheterization is recommended. ?  ?  ?10/2017 cath ?Non-stenotic 2nd RPLB lesion. ?  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 ?Right dominant coronary artery with no evidence of obstructive disease. ?Normal left main with possibly 20 to 30% proximal narrowing. ?LAD is widely patent with eccentric 30% narrowing after the first diagonal.  First diagonal contains eccentric 60% narrowing. ?Angiographically normal circumflex coronary artery giving origin to 3 small obtuse marginal branches ?The left ventricular cavity is normal in size.  No regional wall motion abnormalities noted.  EF is 60%. ?  ?RECOMMENDATIONS: ?  ?Clinical correlation with findings. ?No significant CAD angiographically. ?Reassess  cardiac CT for evidence of cameral fistula as described above.  ?In absence of heart failure/LV systolic dysfunction and with no evidence of elevated end-diastolic pressure, therapy for the cameral fistula is

## 2022-07-05 NOTE — Progress Notes (Signed)
Attempted to reach the patient regarding follow-up LDCT. Unable to reach the patient directly, no VM available to leave a message.  

## 2022-08-16 NOTE — Progress Notes (Signed)
Attempted to reach the patient regarding follow-up LDCT. Unable to reach the patient directly, no VM available to leave a message.

## 2022-09-02 IMAGING — MR MR CERVICAL SPINE W/O CM
5 series · 37 of 48 positions shown · non-contrast
Comparison: Cervical MRI 02/21/2019

CLINICAL DATA: Chronic right-sided neck pain with worsening pain
extending into the right arm for 1 month. No acute injury or prior
relevant surgery. History of right C4-5 cervical facet joint
therapeutic injection.

EXAM:
MRI CERVICAL SPINE WITHOUT CONTRAST
TECHNIQUE: Multiplanar, multisequence MR imaging of the cervical spine was
performed. No intravenous contrast was administered.

[Series 5: T2 · sagittal · 3.0mm · 0.69mm/px · 6 of 15 slices shown (1 of 2)]
[im 1/15]
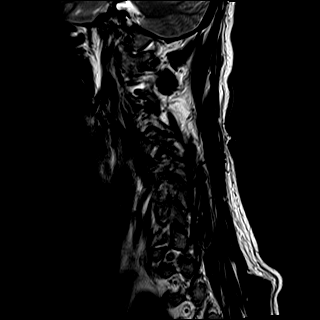
[im 3/15]
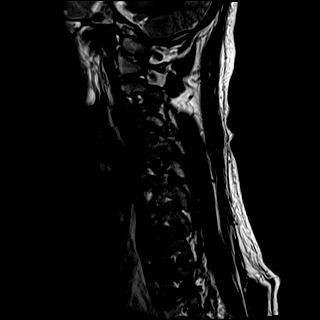
[im 6/15]
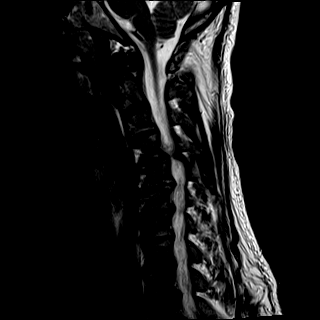
[im 9/15]
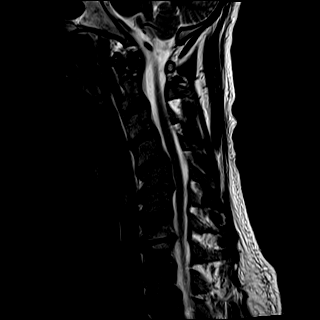
[im 12/15]
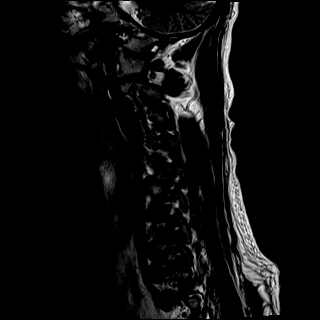
[im 15/15]
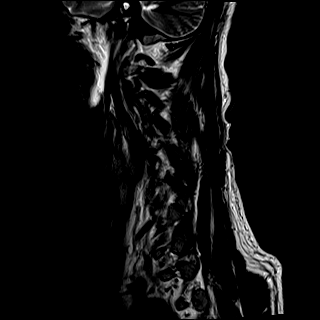

[Series 6: T1 · sagittal · 3.0mm · 0.86mm/px · 6 of 15 slices shown]
[im 1/15]
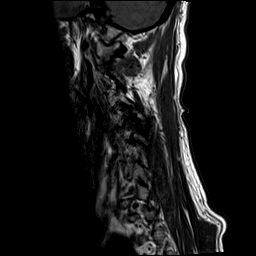
[im 3/15]
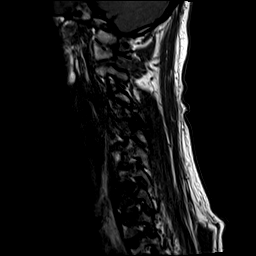
[im 6/15]
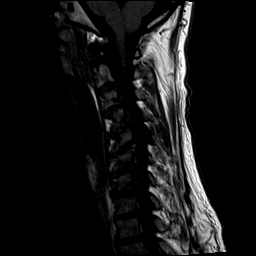
[im 9/15]
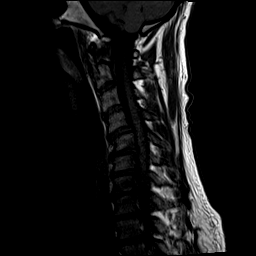
[im 12/15]
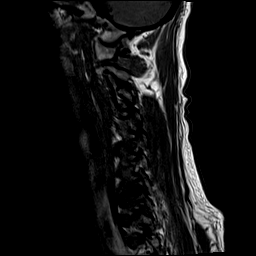
[im 15/15]
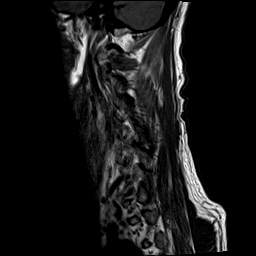

[Series 7: STIR · sagittal · 3.0mm · 0.69mm/px · 6 of 15 slices shown]
[im 1/15]
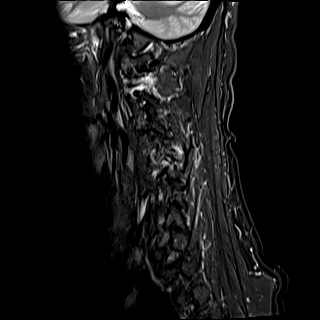
[im 3/15]
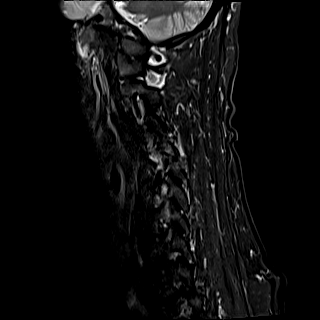
[im 6/15]
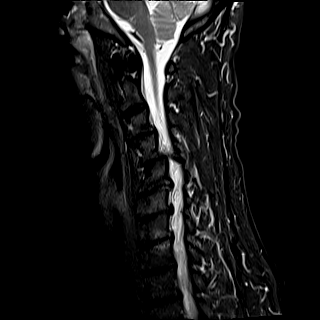
[im 9/15]
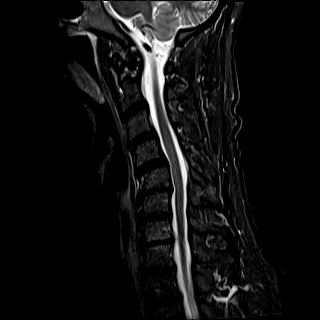
[im 12/15]
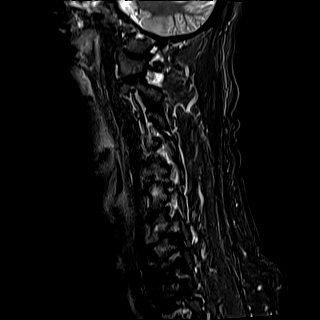
[im 15/15]
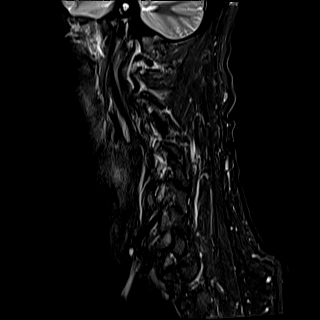

[Series 8: T2 · axial · 3.0mm · 0.70mm/px · z∈[-36,+81]mm · 11 of 35 slices shown (2 of 2)]
[im 1/35]
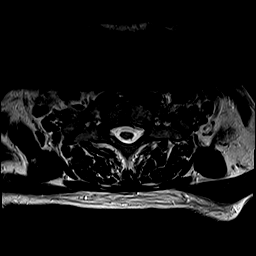
[im 3/35]
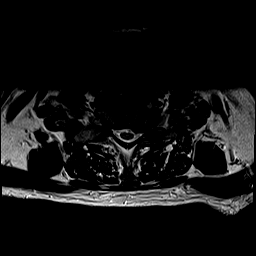
[im 5/35]
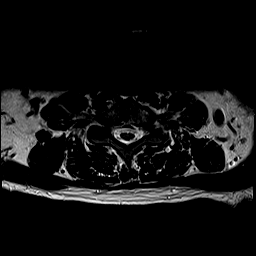
[im 8/35]
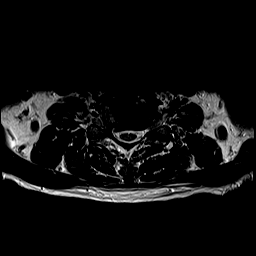
[im 10/35]
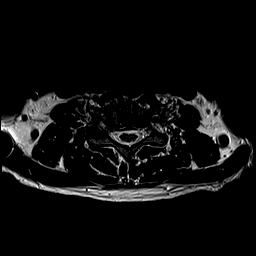
[im 15/35]
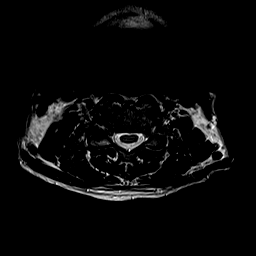
[im 18/35]
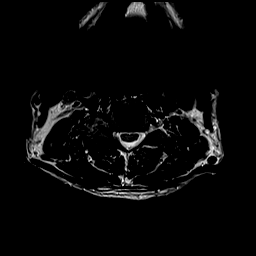
[im 20/35]
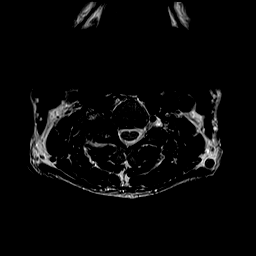
[im 25/35]
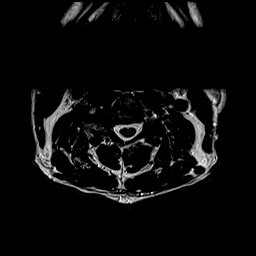
[im 30/35]
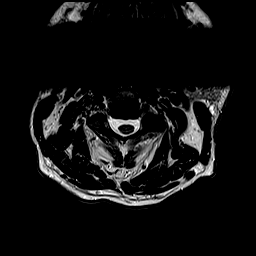
[im 35/35]
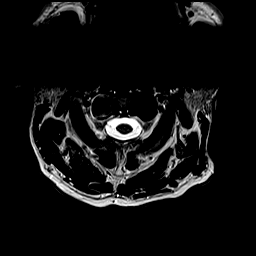

[Series 9: GRE · axial · 3.0mm · 0.35mm/px · z∈[-36,+81]mm · 8 of 35 slices shown]
[im 1/35]
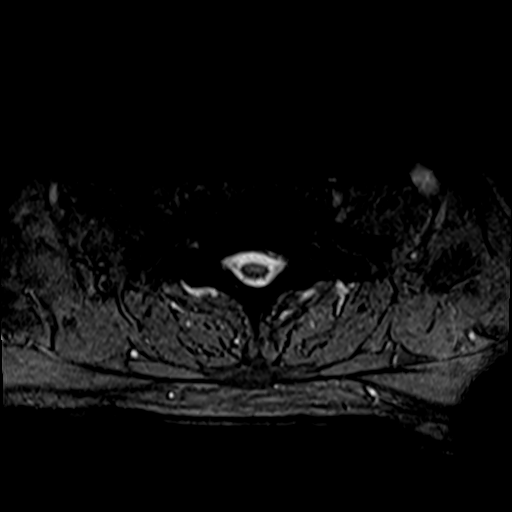
[im 5/35]
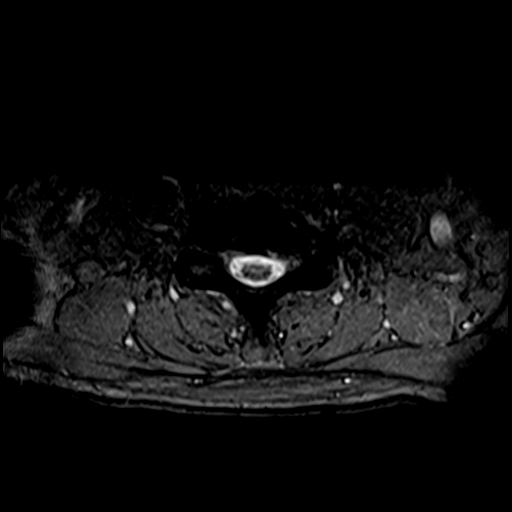
[im 10/35]
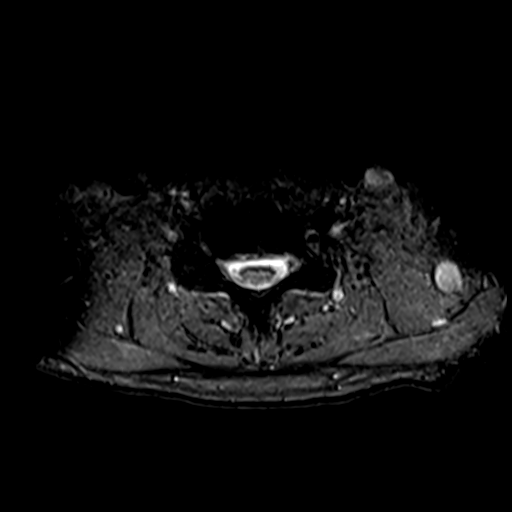
[im 15/35]
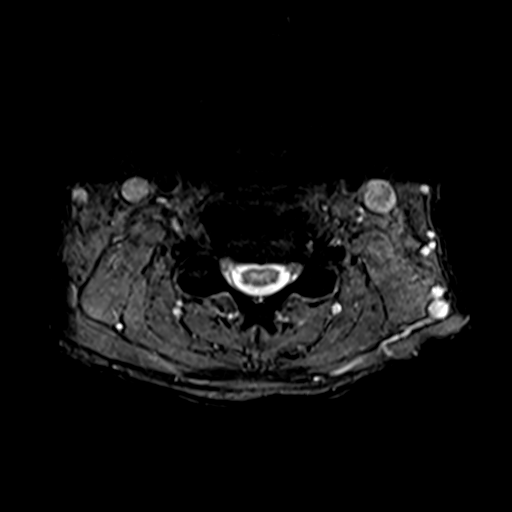
[im 20/35]
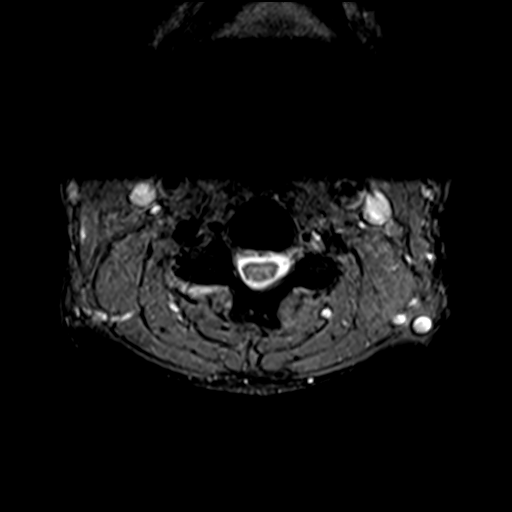
[im 25/35]
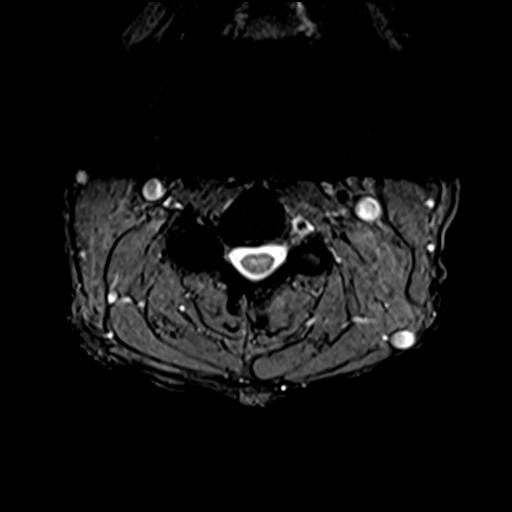
[im 30/35]
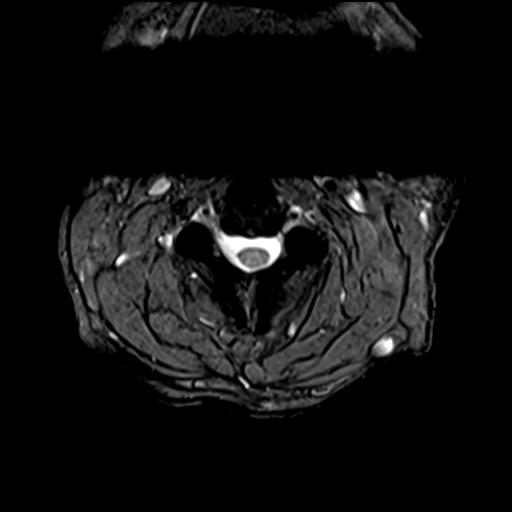
[im 35/35]
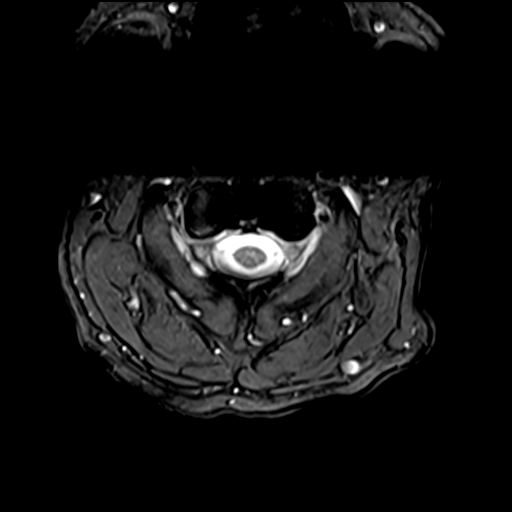

[37 of 48 positions shown; findings below may reference images not displayed]

FINDINGS: Alignment: Unchanged reversal of cervical lordosis with minimal
anterolisthesis at C3-4 and C4-5.

Vertebrae: No acute or suspicious osseous findings. Asymmetric
arthropathy and associated marrow edema at the C3-4 and C4-5 facet
joints, similar to previous study.

Cord: Normal in signal and caliber.

Posterior Fossa, vertebral arteries, paraspinal tissues: Visualized
portions of the posterior fossa appear unremarkable. No significant
paraspinal findings. Bilateral vertebral artery flow voids.

Disc levels:

C2-3: The disc appears normal. Stable mild asymmetric facet
arthropathy on the left. Minimal left foraminal narrowing. The
spinal canal is widely patent.

C3-4: Right foraminal disc osteophyte complex and asymmetric
right-sided facet hypertrophy contribute to at least moderate right
foraminal narrowing which appears mildly progressive. No cord
deformity.

C4-5: Right foraminal disc osteophyte complex and asymmetric
right-sided facet hypertrophy contribute to progressive right
foraminal narrowing which now appears moderate to severe. No cord
deformity. The left foramen is adequately patent.

C5-6: Spondylosis with loss of disc height and posterior osteophytes
covering diffusely bulging disc material. No cord deformity.
Moderate foraminal narrowing is present bilaterally, similar to
previous study.

C6-7: Chronic right foraminal disc osteophyte complex causes
moderate to severe right foraminal narrowing, unchanged from
previous study. No cord deformity. The left foramen is patent.

C7-T1: Spondylosis with posterior osteophytes covering diffusely
bulging disc material. Mild bilateral facet hypertrophy. Chronic
severe left and moderate to severe right foraminal narrowing,
similar to previous study. No cord deformity.
IMPRESSION: 1. Compared with the previous study from 1919, no acute findings are
identified. There is no cord deformity or abnormal cord signal.
2. There is multilevel spondylosis with disc bulging, uncinate
spurring and facet hypertrophy contributing to moderate to severe
foraminal narrowing at multiple levels as detailed above. Compared
with the prior study, the right foraminal narrowing appears slightly
worse at C3-4 and C4-5.
